# Patient Record
Sex: Female | Born: 1945 | Race: White | Hispanic: No | Marital: Married | Smoking: Current every day smoker
Health system: Southern US, Community
[De-identification: ages and names within clinical notes are randomized; demographics above are authoritative.]

## PROBLEM LIST (undated history)

## (undated) DIAGNOSIS — K9403 Colostomy malfunction: Secondary | ICD-10-CM

## (undated) DIAGNOSIS — C801 Malignant (primary) neoplasm, unspecified: Secondary | ICD-10-CM

## (undated) DIAGNOSIS — E669 Obesity, unspecified: Secondary | ICD-10-CM

## (undated) DIAGNOSIS — K5732 Diverticulitis of large intestine without perforation or abscess without bleeding: Secondary | ICD-10-CM

## (undated) DIAGNOSIS — Z87891 Personal history of nicotine dependence: Secondary | ICD-10-CM

## (undated) DIAGNOSIS — K9413 Enterostomy malfunction: Secondary | ICD-10-CM

## (undated) DIAGNOSIS — N189 Chronic kidney disease, unspecified: Secondary | ICD-10-CM

## (undated) DIAGNOSIS — Z87442 Personal history of urinary calculi: Secondary | ICD-10-CM

## (undated) HISTORY — DX: Malignant (primary) neoplasm, unspecified: C80.1

## (undated) HISTORY — DX: Colostomy malfunction: K94.03

## (undated) HISTORY — DX: Personal history of nicotine dependence: Z87.891

## (undated) HISTORY — DX: Enterostomy malfunction: K94.13

## (undated) HISTORY — PX: TUBAL LIGATION: SHX77

## (undated) HISTORY — DX: Personal history of urinary calculi: Z87.442

## (undated) HISTORY — DX: Obesity, unspecified: E66.9

## (undated) HISTORY — PX: DILATION AND CURETTAGE OF UTERUS: SHX78

## (undated) HISTORY — DX: Diverticulitis of large intestine without perforation or abscess without bleeding: K57.32

## (undated) HISTORY — PX: PYELOLITHOTOMY: SHX2278

## (undated) HISTORY — PX: COLONOSCOPY: SHX174

## (undated) HISTORY — PX: APPENDECTOMY: SHX54

---

## 1999-05-20 HISTORY — PX: COLON SURGERY: SHX602

## 2001-09-03 HISTORY — PX: MOLE REMOVAL: SHX2046

## 2004-12-04 ENCOUNTER — Ambulatory Visit: Payer: Self-pay | Admitting: Specialist

## 2004-12-14 ENCOUNTER — Ambulatory Visit: Payer: Self-pay | Admitting: Specialist

## 2005-08-13 ENCOUNTER — Ambulatory Visit: Payer: Self-pay | Admitting: General Surgery

## 2006-09-05 ENCOUNTER — Ambulatory Visit: Payer: Self-pay | Admitting: Family Medicine

## 2007-05-28 ENCOUNTER — Ambulatory Visit: Payer: Self-pay | Admitting: Specialist

## 2007-06-03 ENCOUNTER — Other Ambulatory Visit: Payer: Self-pay

## 2007-06-03 ENCOUNTER — Ambulatory Visit: Payer: Self-pay | Admitting: Specialist

## 2007-06-05 ENCOUNTER — Ambulatory Visit: Payer: Self-pay | Admitting: Specialist

## 2007-06-30 ENCOUNTER — Ambulatory Visit: Payer: Self-pay | Admitting: Specialist

## 2007-09-04 HISTORY — PX: LITHOTRIPSY: SUR834

## 2008-01-12 ENCOUNTER — Ambulatory Visit: Payer: Self-pay | Admitting: Family Medicine

## 2009-02-10 ENCOUNTER — Ambulatory Visit: Payer: Self-pay | Admitting: Family Medicine

## 2009-02-15 ENCOUNTER — Ambulatory Visit: Payer: Self-pay | Admitting: General Surgery

## 2009-04-08 ENCOUNTER — Ambulatory Visit: Payer: Self-pay | Admitting: Specialist

## 2009-09-03 DIAGNOSIS — C801 Malignant (primary) neoplasm, unspecified: Secondary | ICD-10-CM

## 2009-09-03 DIAGNOSIS — Z87442 Personal history of urinary calculi: Secondary | ICD-10-CM

## 2009-09-03 HISTORY — DX: Malignant (primary) neoplasm, unspecified: C80.1

## 2009-09-03 HISTORY — PX: COLOSTOMY: SHX63

## 2009-09-03 HISTORY — PX: HYSTEROSCOPY: SHX211

## 2009-09-03 HISTORY — DX: Personal history of urinary calculi: Z87.442

## 2009-09-03 HISTORY — PX: ABDOMINAL HYSTERECTOMY: SHX81

## 2010-02-01 ENCOUNTER — Ambulatory Visit: Payer: Self-pay | Admitting: Gynecologic Oncology

## 2010-02-08 ENCOUNTER — Ambulatory Visit: Payer: Self-pay | Admitting: Obstetrics and Gynecology

## 2010-02-09 ENCOUNTER — Ambulatory Visit: Payer: Self-pay | Admitting: Obstetrics and Gynecology

## 2010-02-14 ENCOUNTER — Ambulatory Visit: Payer: Self-pay | Admitting: Gynecologic Oncology

## 2010-02-17 ENCOUNTER — Ambulatory Visit: Payer: Self-pay | Admitting: Gynecologic Oncology

## 2010-02-20 ENCOUNTER — Ambulatory Visit: Payer: Self-pay | Admitting: Cardiovascular Disease

## 2010-02-21 ENCOUNTER — Inpatient Hospital Stay: Payer: Self-pay | Admitting: Gynecologic Oncology

## 2010-03-03 ENCOUNTER — Ambulatory Visit: Payer: Self-pay | Admitting: Gynecologic Oncology

## 2010-03-09 ENCOUNTER — Ambulatory Visit: Payer: Self-pay | Admitting: Oncology

## 2010-03-22 ENCOUNTER — Ambulatory Visit: Payer: Self-pay | Admitting: General Surgery

## 2010-04-03 ENCOUNTER — Ambulatory Visit: Payer: Self-pay | Admitting: Gynecologic Oncology

## 2010-04-03 ENCOUNTER — Ambulatory Visit: Payer: Self-pay | Admitting: Oncology

## 2010-05-04 ENCOUNTER — Ambulatory Visit: Payer: Self-pay | Admitting: Gynecologic Oncology

## 2010-05-04 ENCOUNTER — Ambulatory Visit: Payer: Self-pay | Admitting: Oncology

## 2010-05-10 ENCOUNTER — Inpatient Hospital Stay: Payer: Self-pay | Admitting: General Surgery

## 2010-05-10 HISTORY — PX: COLON SURGERY: SHX602

## 2010-05-18 ENCOUNTER — Inpatient Hospital Stay: Payer: Self-pay | Admitting: General Surgery

## 2010-05-23 LAB — PATHOLOGY REPORT

## 2010-06-03 ENCOUNTER — Ambulatory Visit: Payer: Self-pay | Admitting: Oncology

## 2010-06-21 ENCOUNTER — Ambulatory Visit: Payer: Self-pay | Admitting: Oncology

## 2010-07-04 ENCOUNTER — Ambulatory Visit: Payer: Self-pay | Admitting: Oncology

## 2010-08-03 ENCOUNTER — Ambulatory Visit: Payer: Self-pay | Admitting: Oncology

## 2010-08-25 LAB — CA 125: CA 125: 17.7 U/mL (ref 0.0–34.0)

## 2010-09-03 ENCOUNTER — Ambulatory Visit: Payer: Self-pay | Admitting: Oncology

## 2010-09-21 LAB — CA 125: CA 125: 15.3 U/mL (ref 0.0–34.0)

## 2010-10-04 ENCOUNTER — Ambulatory Visit: Payer: Self-pay | Admitting: Oncology

## 2010-11-02 ENCOUNTER — Ambulatory Visit: Payer: Self-pay | Admitting: Oncology

## 2010-11-13 ENCOUNTER — Ambulatory Visit: Payer: Self-pay | Admitting: Anesthesiology

## 2010-11-15 ENCOUNTER — Ambulatory Visit: Payer: Self-pay | Admitting: General Surgery

## 2010-11-15 HISTORY — PX: COLON SURGERY: SHX602

## 2010-12-03 ENCOUNTER — Ambulatory Visit: Payer: Self-pay | Admitting: Oncology

## 2010-12-13 ENCOUNTER — Ambulatory Visit: Payer: Self-pay | Admitting: Oncology

## 2011-01-02 ENCOUNTER — Ambulatory Visit: Payer: Self-pay | Admitting: Oncology

## 2011-02-20 ENCOUNTER — Ambulatory Visit: Payer: Self-pay | Admitting: Oncology

## 2011-03-04 ENCOUNTER — Ambulatory Visit: Payer: Self-pay | Admitting: Oncology

## 2011-04-04 ENCOUNTER — Ambulatory Visit: Payer: Self-pay | Admitting: Oncology

## 2011-06-20 ENCOUNTER — Ambulatory Visit: Payer: Self-pay | Admitting: Oncology

## 2011-06-21 LAB — CA 125: CA 125: 12.8 U/mL (ref 0.0–34.0)

## 2011-07-05 ENCOUNTER — Ambulatory Visit: Payer: Self-pay | Admitting: Oncology

## 2011-10-16 ENCOUNTER — Ambulatory Visit: Payer: Self-pay | Admitting: Gynecologic Oncology

## 2011-10-22 ENCOUNTER — Ambulatory Visit: Payer: Self-pay | Admitting: Oncology

## 2011-10-26 LAB — CBC CANCER CENTER
Basophil #: 0.1 x10 3/mm (ref 0.0–0.1)
Basophil %: 0.8 %
Eosinophil %: 1 %
HCT: 47 % (ref 35.0–47.0)
HGB: 15.4 g/dL (ref 12.0–16.0)
Lymphocyte #: 1.6 x10 3/mm (ref 1.0–3.6)
Lymphocyte %: 18.3 %
MCH: 35.8 pg — ABNORMAL HIGH (ref 26.0–34.0)
MCHC: 32.8 g/dL (ref 32.0–36.0)
MCV: 109.3 fL — ABNORMAL HIGH (ref 80–100)
Monocyte %: 8 %
RBC: 4.29 10*6/uL (ref 3.80–5.20)
RDW: 14.6 % — ABNORMAL HIGH (ref 11.5–14.5)
WBC: 8.9 x10 3/mm (ref 3.6–11.0)

## 2011-10-26 LAB — COMPREHENSIVE METABOLIC PANEL
Albumin: 3.5 g/dL (ref 3.4–5.0)
Bilirubin,Total: 0.7 mg/dL (ref 0.2–1.0)
Calcium, Total: 8.9 mg/dL (ref 8.5–10.1)
Chloride: 102 mmol/L (ref 98–107)
Co2: 26 mmol/L (ref 21–32)
Creatinine: 1.21 mg/dL (ref 0.60–1.30)
EGFR (African American): 58 — ABNORMAL LOW
Potassium: 3.3 mmol/L — ABNORMAL LOW (ref 3.5–5.1)
SGOT(AST): 20 U/L (ref 15–37)
Sodium: 141 mmol/L (ref 136–145)

## 2011-10-28 LAB — CA 125: CA 125: 15.4 U/mL (ref 0.0–34.0)

## 2011-10-30 ENCOUNTER — Ambulatory Visit: Payer: Self-pay | Admitting: Family Medicine

## 2011-11-02 ENCOUNTER — Ambulatory Visit: Payer: Self-pay | Admitting: Gynecologic Oncology

## 2012-01-25 ENCOUNTER — Ambulatory Visit: Payer: Self-pay | Admitting: Oncology

## 2012-01-25 LAB — CBC CANCER CENTER
Basophil #: 0.1 x10 3/mm (ref 0.0–0.1)
Basophil %: 1.2 %
Eosinophil #: 0.1 x10 3/mm (ref 0.0–0.7)
HGB: 15.2 g/dL (ref 12.0–16.0)
Lymphocyte #: 1.9 x10 3/mm (ref 1.0–3.6)
MCH: 36.5 pg — ABNORMAL HIGH (ref 26.0–34.0)
MCHC: 34.3 g/dL (ref 32.0–36.0)
MCV: 106 fL — ABNORMAL HIGH (ref 80–100)
Monocyte #: 1.1 x10 3/mm — ABNORMAL HIGH (ref 0.2–0.9)
Neutrophil #: 6.4 x10 3/mm (ref 1.4–6.5)
Platelet: 232 x10 3/mm (ref 150–440)
RBC: 4.16 10*6/uL (ref 3.80–5.20)
RDW: 13.5 % (ref 11.5–14.5)
WBC: 9.6 x10 3/mm (ref 3.6–11.0)

## 2012-01-25 LAB — COMPREHENSIVE METABOLIC PANEL
Albumin: 3.4 g/dL (ref 3.4–5.0)
Anion Gap: 11 (ref 7–16)
BUN: 23 mg/dL — ABNORMAL HIGH (ref 7–18)
Calcium, Total: 9 mg/dL (ref 8.5–10.1)
Chloride: 103 mmol/L (ref 98–107)
Creatinine: 1.08 mg/dL (ref 0.60–1.30)
EGFR (African American): >60
Osmolality: 284 (ref 275–301)
SGOT(AST): 23 U/L (ref 15–37)
Sodium: 140 mmol/L (ref 136–145)

## 2012-01-27 LAB — CA 125: CA 125: 18.6 U/mL (ref 0.0–34.0)

## 2012-02-02 ENCOUNTER — Ambulatory Visit: Payer: Self-pay | Admitting: Oncology

## 2012-04-15 ENCOUNTER — Ambulatory Visit: Payer: Self-pay | Admitting: Gynecologic Oncology

## 2012-04-15 LAB — CBC CANCER CENTER
Basophil #: 0.1 x10 3/mm (ref 0.0–0.1)
Basophil %: 1.1 %
Eosinophil #: 0.1 x10 3/mm (ref 0.0–0.7)
Eosinophil %: 0.9 %
HCT: 45.5 % (ref 35.0–47.0)
HGB: 15.1 g/dL (ref 12.0–16.0)
Lymphocyte #: 2.3 x10 3/mm (ref 1.0–3.6)
Lymphocyte %: 24.4 %
MCH: 35.2 pg — ABNORMAL HIGH (ref 26.0–34.0)
MCHC: 33.3 g/dL (ref 32.0–36.0)
MCV: 106 fL — ABNORMAL HIGH (ref 80–100)
Monocyte #: 0.9 x10 3/mm (ref 0.2–0.9)
Monocyte %: 10 %
Neutrophil #: 6 x10 3/mm (ref 1.4–6.5)
Neutrophil %: 63.6 %
Platelet: 224 x10 3/mm (ref 150–440)
RBC: 4.3 10*6/uL (ref 3.80–5.20)
RDW: 13.9 % (ref 11.5–14.5)
WBC: 9.4 x10 3/mm (ref 3.6–11.0)

## 2012-04-15 LAB — COMPREHENSIVE METABOLIC PANEL
Albumin: 3.4 g/dL (ref 3.4–5.0)
Alkaline Phosphatase: 112 U/L (ref 50–136)
Anion Gap: 13 (ref 7–16)
BUN: 21 mg/dL — ABNORMAL HIGH (ref 7–18)
Bilirubin,Total: 0.5 mg/dL (ref 0.2–1.0)
Calcium, Total: 8.9 mg/dL (ref 8.5–10.1)
Chloride: 102 mmol/L (ref 98–107)
Co2: 24 mmol/L (ref 21–32)
Creatinine: 1.17 mg/dL (ref 0.60–1.30)
EGFR (African American): 57 — ABNORMAL LOW
EGFR (Non-African Amer.): 49 — ABNORMAL LOW
Glucose: 109 mg/dL — ABNORMAL HIGH (ref 65–99)
Osmolality: 281 (ref 275–301)
Potassium: 3.7 mmol/L (ref 3.5–5.1)
SGOT(AST): 21 U/L (ref 15–37)
SGPT (ALT): 23 U/L (ref 12–78)
Sodium: 139 mmol/L (ref 136–145)
Total Protein: 7.2 g/dL (ref 6.4–8.2)

## 2012-04-21 ENCOUNTER — Ambulatory Visit: Payer: Self-pay | Admitting: Oncology

## 2012-05-04 ENCOUNTER — Ambulatory Visit: Payer: Self-pay | Admitting: Gynecologic Oncology

## 2012-11-05 ENCOUNTER — Ambulatory Visit: Payer: Self-pay | Admitting: Family Medicine

## 2012-12-05 ENCOUNTER — Ambulatory Visit: Payer: Self-pay | Admitting: Oncology

## 2012-12-05 LAB — CBC CANCER CENTER
Basophil %: 0.5 %
Eosinophil #: 0.1 x10 3/mm (ref 0.0–0.7)
Eosinophil %: 0.8 %
Lymphocyte %: 14.4 %
MCHC: 33.7 g/dL (ref 32.0–36.0)
MCV: 105 fL — ABNORMAL HIGH (ref 80–100)
Monocyte #: 0.8 x10 3/mm (ref 0.2–0.9)
Neutrophil %: 75.7 %
Platelet: 208 x10 3/mm (ref 150–440)
RBC: 4.23 10*6/uL (ref 3.80–5.20)

## 2012-12-05 LAB — COMPREHENSIVE METABOLIC PANEL
Albumin: 3.6 g/dL (ref 3.4–5.0)
Anion Gap: 13 (ref 7–16)
Bilirubin,Total: 0.8 mg/dL (ref 0.2–1.0)
Calcium, Total: 9.1 mg/dL (ref 8.5–10.1)
Chloride: 102 mmol/L (ref 98–107)
Co2: 26 mmol/L (ref 21–32)
Creatinine: 1.23 mg/dL (ref 0.60–1.30)
Osmolality: 287 (ref 275–301)
Potassium: 4.4 mmol/L (ref 3.5–5.1)
SGOT(AST): 26 U/L (ref 15–37)
Sodium: 141 mmol/L (ref 136–145)
Total Protein: 7.4 g/dL (ref 6.4–8.2)

## 2012-12-06 LAB — CA 125: CA 125: 13.7 U/mL (ref 0.0–34.0)

## 2012-12-31 ENCOUNTER — Encounter: Payer: Self-pay | Admitting: *Deleted

## 2013-01-01 ENCOUNTER — Ambulatory Visit: Payer: Self-pay | Admitting: Oncology

## 2013-01-19 ENCOUNTER — Ambulatory Visit: Payer: Self-pay | Admitting: General Surgery

## 2013-02-18 ENCOUNTER — Encounter: Payer: Self-pay | Admitting: General Surgery

## 2013-02-18 ENCOUNTER — Ambulatory Visit (INDEPENDENT_AMBULATORY_CARE_PROVIDER_SITE_OTHER): Payer: BC Managed Care – PPO | Admitting: General Surgery

## 2013-02-18 VITALS — BP 134/72 | HR 90 | Resp 16 | Ht 62.0 in | Wt 162.0 lb

## 2013-02-18 DIAGNOSIS — Z4659 Encounter for fitting and adjustment of other gastrointestinal appliance and device: Secondary | ICD-10-CM

## 2013-02-18 DIAGNOSIS — IMO0002 Reserved for concepts with insufficient information to code with codable children: Secondary | ICD-10-CM

## 2013-02-18 DIAGNOSIS — K9403 Colostomy malfunction: Secondary | ICD-10-CM

## 2013-02-18 NOTE — Patient Instructions (Signed)
Nurse 1 week

## 2013-02-18 NOTE — Progress Notes (Deleted)
Patient ID: Virlee Stroschein, female   DOB: 1945-09-04, 67 y.o.   MRN: [id number]  Chief Complaint  Patient presents with  . Other    check stoma    HPI Mylah Baynes is a 67 y.o. female here for her HPI  Past Medical History  Diagnosis Date  . Diverticulitis of colon (without mention of hemorrhage)   . Mechanical complication of colostomy and enterostomy   . Hypertension     Past Surgical History  Procedure Laterality Date  . Colonoscopy  2011    DR. BYRNET  . Appendectomy    . Lithotripsy  2009  . Tubal ligation    . Pyelolithotomy    . Colostomy  2011  . Abdominal hysterectomy  2011  . Dilation and curettage of uterus      Family History  Problem Relation Age of Onset  . Breast cancer Maternal Grandmother   . Colon cancer Mother     Social History History  Substance Use Topics  . Smoking status: Current Every Day Smoker -- 15 years  . Smokeless tobacco: Not on file  . Alcohol Use: Yes    No Known Allergies  No current outpatient prescriptions on file.   No current facility-administered medications for this visit.    Review of Systems Review of Systems  Constitutional: Negative.   Respiratory: Negative.   Cardiovascular: Negative.     There were no vitals taken for this visit.  Physical Exam Physical Exam  Data Reviewed ***  Assessment    ***    Plan    ***       Ples Specter 02/18/2013, 4:06 PM

## 2013-02-18 NOTE — Progress Notes (Signed)
Patient ID: [name], female   DOB: 04/28/1946, 67 y.o.   MRN: [id number]  Chief Complaint  Patient presents with  . Other    check stoma    HPI [name] is a 67 y.o. female.  Patient here today for stoma check. The patient reports weeping and bleeding from the inferior aspect of her stoma. This is coming increasingly uncomfortable. She is not experiencing any stomal leakage.  The patient had undergone a radical hysterectomy node dissection for cancer. She developed a colo vaginal fistula and subsequently underwent a elective sigmoid colectomy. On postoperative day 8 she developed an anastomotic leak requiring diverting colostomy as well as closure of the distal colon segment..  The patient reports she occasionally will pass a small flatus and mucous through her rectum. She is reassured that this is normal.   HPI  Past Medical History  Diagnosis Date  . Diverticulitis of colon (without mention of hemorrhage)   . Mechanical complication of colostomy and enterostomy   . Hypertension     Past Surgical History  Procedure Laterality Date  . Colonoscopy  2011    DR. BYRNET  . Appendectomy    . Lithotripsy  2009  . Tubal ligation    . Pyelolithotomy    . Colostomy  2011  . Abdominal hysterectomy  2011  . Dilation and curettage of uterus      Family History  Problem Relation Age of Onset  . Breast cancer Maternal Grandmother   . Colon cancer Mother     Social History History  Substance Use Topics  . Smoking status: Current Every Day Smoker -- 15 years  . Smokeless tobacco: Never Used  . Alcohol Use: Yes    No Known Allergies  No current outpatient prescriptions on file.   No current facility-administered medications for this visit.    Review of Systems Review of Systems  Constitutional: Negative.   Respiratory: Negative.   Cardiovascular: Negative.     Blood pressure 134/72, pulse 90, resp. rate 16, height 5\' 2"  (1.575 m), weight 162 lb (73.483  kg).  Physical Exam Physical Exam  Constitutional: She appears well-developed and well-nourished.  Cardiovascular: Normal rate, regular rhythm and normal heart sounds.   Pulmonary/Chest: Breath sounds normal.  Lymphadenopathy:    She has no cervical adenopathy.  Neurological: She is alert.  Skin: Skin is warm and dry.   abdominal examination shows a weakness in the superior aspect of the midline incision. Easily reducible contents. Mild diffuse bulge in the area around the umbilicus. Digital rectal exam of the stoma shows the loop of colon evident without significant widening of the fascial defect.   There is excoriation on the exposed mucosa of the lower half of the stoma primarily from the 4 to 8:00 position. This was treated with silver nitrate. The stoma easily admits the index finger.  Data Reviewed CT scan of the abdomen dated 04/21/2012 shows evidence of a loop of colon adjacent to the stoma site as well as a midline ventral hernia. (Multiple). No evidence of obstruction no evidence of recurrent metastatic disease. Ossified gallstone.  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    Plan    The patient will return in one week for a second application of silver nitrate by the nurse. Further followup will take place based on the results of her spots to this therapy. At this time the gallstone and ventral hernias very symptomatic and will continue to be  observed.       Earline Mayotte 02/19/2013, 3:57 PM

## 2013-02-19 ENCOUNTER — Encounter: Payer: Self-pay | Admitting: General Surgery

## 2013-02-19 DIAGNOSIS — K9403 Colostomy malfunction: Secondary | ICD-10-CM | POA: Insufficient documentation

## 2013-02-19 DIAGNOSIS — IMO0002 Reserved for concepts with insufficient information to code with codable children: Secondary | ICD-10-CM | POA: Insufficient documentation

## 2013-02-25 ENCOUNTER — Ambulatory Visit: Payer: BC Managed Care – PPO | Admitting: *Deleted

## 2013-02-25 DIAGNOSIS — IMO0002 Reserved for concepts with insufficient information to code with codable children: Secondary | ICD-10-CM

## 2013-02-25 NOTE — Patient Instructions (Signed)
Patient to call for changes or new issues.

## 2013-02-25 NOTE — Progress Notes (Signed)
Patient ID: [name], female   DOB: 19-May-1946, 67 y.o.   MRN: [id number]  Patient here today for 1 week follow up for a stoma. Patient states liquid drainage is less. Stoma area size measures less than 1 inch now.  Silver  Nitrate used to lower portion of stoma as before.  Colostomy appliance reapplied.  Patient to call for changes.

## 2013-03-16 ENCOUNTER — Encounter: Payer: Self-pay | Admitting: *Deleted

## 2013-04-27 ENCOUNTER — Ambulatory Visit: Payer: Self-pay | Admitting: Gynecologic Oncology

## 2013-08-14 ENCOUNTER — Ambulatory Visit: Payer: Self-pay | Admitting: Oncology

## 2013-08-14 LAB — COMPREHENSIVE METABOLIC PANEL
Alkaline Phosphatase: 91 U/L
Anion Gap: 12 (ref 7–16)
BUN: 25 mg/dL — ABNORMAL HIGH (ref 7–18)
Bilirubin,Total: 0.5 mg/dL (ref 0.2–1.0)
Chloride: 104 mmol/L (ref 98–107)
Co2: 25 mmol/L (ref 21–32)
Creatinine: 1.37 mg/dL — ABNORMAL HIGH (ref 0.60–1.30)
EGFR (African American): 46 — ABNORMAL LOW
EGFR (Non-African Amer.): 40 — ABNORMAL LOW
Glucose: 105 mg/dL — ABNORMAL HIGH (ref 65–99)
Osmolality: 286 (ref 275–301)
Potassium: 3.8 mmol/L (ref 3.5–5.1)
SGOT(AST): 18 U/L (ref 15–37)
Sodium: 141 mmol/L (ref 136–145)
Total Protein: 7.6 g/dL (ref 6.4–8.2)

## 2013-08-14 LAB — CBC CANCER CENTER
Basophil #: 0.2 x10 3/mm — ABNORMAL HIGH (ref 0.0–0.1)
HCT: 46.1 % (ref 35.0–47.0)
HGB: 15.2 g/dL (ref 12.0–16.0)
Lymphocyte #: 2.4 x10 3/mm (ref 1.0–3.6)
Lymphocyte %: 18.1 %
MCV: 108 fL — ABNORMAL HIGH (ref 80–100)
Neutrophil %: 72.1 %
RBC: 4.28 10*6/uL (ref 3.80–5.20)
RDW: 13.9 % (ref 11.5–14.5)
WBC: 13.4 x10 3/mm — ABNORMAL HIGH (ref 3.6–11.0)

## 2013-08-15 LAB — CA 125: CA 125: 14.9 U/mL (ref 0.0–34.0)

## 2013-09-03 ENCOUNTER — Ambulatory Visit: Payer: Self-pay | Admitting: Oncology

## 2013-09-10 ENCOUNTER — Ambulatory Visit: Payer: BC Managed Care – PPO | Admitting: General Surgery

## 2013-09-28 ENCOUNTER — Ambulatory Visit (INDEPENDENT_AMBULATORY_CARE_PROVIDER_SITE_OTHER): Payer: Self-pay | Admitting: General Surgery

## 2013-09-28 ENCOUNTER — Encounter: Payer: Self-pay | Admitting: General Surgery

## 2013-09-28 VITALS — BP 124/72 | HR 76 | Resp 12 | Ht 62.0 in | Wt 155.0 lb

## 2013-09-28 DIAGNOSIS — K439 Ventral hernia without obstruction or gangrene: Secondary | ICD-10-CM

## 2013-09-28 NOTE — Patient Instructions (Signed)
Follow up appointment to be announced.  

## 2013-09-28 NOTE — Progress Notes (Signed)
Patient ID: [name], female   DOB: March 06, 1946, 68 y.o.   MRN: [id number]  Chief Complaint  Patient presents with  . Other    hernia    HPI [name] is a 68 y.o. female here for an evaluation of an ventral hernia been there for about one year. Patient states the area does not hurt her. Patient states she thinks she needs to have a bowel movement at the rectal area once or twice daily.  HPI  Past Medical History  Diagnosis Date  . Diverticulitis of colon (without mention of hemorrhage)   . Mechanical complication of colostomy and enterostomy   . Hypertension   . Cancer 2011    endometrial  . Personal history of tobacco use, presenting hazards to health   . History of kidney stones 2011  . Obesity, unspecified     Past Surgical History  Procedure Laterality Date  . Colonoscopy  2011    DR. BYRNET  . Appendectomy    . Lithotripsy  2009  . Tubal ligation    . Pyelolithotomy    . Colostomy  2011  . Abdominal hysterectomy  2011  . Dilation and curettage of uterus    . Mole removal  2003  . Hysteroscopy  2011    Family History  Problem Relation Age of Onset  . Breast cancer Maternal Grandmother   . Colon cancer Mother     Social History History  Substance Use Topics  . Smoking status: Current Every Day Smoker -- 15 years  . Smokeless tobacco: Never Used  . Alcohol Use: Yes    No Known Allergies  No current outpatient prescriptions on file.   No current facility-administered medications for this visit.    Review of Systems Review of Systems  Constitutional: Negative.   Respiratory: Negative.   Cardiovascular: Negative.     Blood pressure 124/72, pulse 76, resp. rate 12, height 5\' 2"  (1.575 m), weight 155 lb (70.308 kg).  Physical Exam Physical Exam  Constitutional: She is oriented to person, place, and time. She appears well-developed and well-nourished.  Eyes: Conjunctivae are normal.  Cardiovascular: Normal rate, regular rhythm and  normal heart sounds.   Pulmonary/Chest: Breath sounds normal.  Abdominal: Soft. Normal appearance and bowel sounds are normal. There is no hepatosplenomegaly. There is no tenderness. A hernia is present. Hernia confirmed positive in the ventral area.  Hernia below the belly button.   Neurological: She is alert and oriented to person, place, and time.  Skin: Skin is warm and dry.    Data Reviewed CT scan of the abdomen dated 04/21/2012 shows evidence of a loop of colon adjacent to the stoma site as well as a midline ventral hernia. (Multiple). No evidence of obstruction no evidence of recurrent metastatic disease. Ossified gallstone.   Assessment    Improve stomal function.  Asymptomatic ventral hernias.     Plan    At this time, without a total guarantee that she would not experience an anastomotic leak, she is not interested in investigating whether a colostomy takedown as possible. Her hernias are essentially stable from her exam in June. They are not interfering with stomal function.  The patient although by the office if she desires investigation for hernia repair or stomal reversal.        [name] 09/29/2013, 8:16 PM

## 2013-09-29 DIAGNOSIS — K439 Ventral hernia without obstruction or gangrene: Secondary | ICD-10-CM | POA: Insufficient documentation

## 2013-09-30 ENCOUNTER — Encounter: Payer: Self-pay | Admitting: General Surgery

## 2013-10-19 ENCOUNTER — Encounter: Payer: Self-pay | Admitting: General Surgery

## 2013-12-17 ENCOUNTER — Ambulatory Visit: Payer: Self-pay | Admitting: Family Medicine

## 2013-12-30 ENCOUNTER — Telehealth: Payer: Self-pay | Admitting: *Deleted

## 2013-12-30 NOTE — Telephone Encounter (Signed)
Phone call from patient asking for a new RX for ostomy bags and supplies sent to [facility] in [place]. Completed RX and faxed to # [phone] att: Ian Bushman

## 2014-01-19 ENCOUNTER — Ambulatory Visit: Payer: Self-pay | Admitting: Oncology

## 2014-01-19 LAB — CBC CANCER CENTER
BASOS ABS: 0.2 x10 3/mm — AB (ref 0.0–0.1)
BASOS PCT: 1.6 %
Eosinophil #: 0.1 x10 3/mm (ref 0.0–0.7)
Eosinophil %: 1.2 %
HCT: 43 % (ref 35.0–47.0)
HGB: 14.6 g/dL (ref 12.0–16.0)
LYMPHS ABS: 1.9 x10 3/mm (ref 1.0–3.6)
LYMPHS PCT: 15.5 %
MCH: 35.7 pg — AB (ref 26.0–34.0)
MCHC: 34 g/dL (ref 32.0–36.0)
MCV: 105 fL — ABNORMAL HIGH (ref 80–100)
MONOS PCT: 7.9 %
Monocyte #: 1 x10 3/mm — ABNORMAL HIGH (ref 0.2–0.9)
NEUTROS ABS: 9.2 x10 3/mm — AB (ref 1.4–6.5)
Neutrophil %: 73.8 %
PLATELETS: 312 x10 3/mm (ref 150–440)
RBC: 4.1 10*6/uL (ref 3.80–5.20)
RDW: 13.6 % (ref 11.5–14.5)
WBC: 12.4 x10 3/mm — ABNORMAL HIGH (ref 3.6–11.0)

## 2014-01-19 LAB — COMPREHENSIVE METABOLIC PANEL
ALBUMIN: 3.4 g/dL (ref 3.4–5.0)
ALT: 15 U/L (ref 12–78)
Alkaline Phosphatase: 102 U/L
Anion Gap: 11 (ref 7–16)
BILIRUBIN TOTAL: 0.6 mg/dL (ref 0.2–1.0)
BUN: 18 mg/dL (ref 7–18)
CHLORIDE: 104 mmol/L (ref 98–107)
CREATININE: 1.25 mg/dL (ref 0.60–1.30)
Calcium, Total: 8.6 mg/dL (ref 8.5–10.1)
Co2: 26 mmol/L (ref 21–32)
EGFR (African American): 52 — ABNORMAL LOW
GFR CALC NON AF AMER: 45 — AB
GLUCOSE: 119 mg/dL — AB (ref 65–99)
OSMOLALITY: 284 (ref 275–301)
POTASSIUM: 4.2 mmol/L (ref 3.5–5.1)
SGOT(AST): 14 U/L — ABNORMAL LOW (ref 15–37)
Sodium: 141 mmol/L (ref 136–145)
Total Protein: 7.6 g/dL (ref 6.4–8.2)

## 2014-01-20 LAB — CA 125: CA 125: 19 U/mL (ref 0.0–34.0)

## 2014-02-01 ENCOUNTER — Ambulatory Visit: Payer: Self-pay | Admitting: Oncology

## 2014-02-01 HISTORY — PX: OTHER SURGICAL HISTORY: SHX169

## 2014-02-08 ENCOUNTER — Encounter: Payer: Self-pay | Admitting: General Surgery

## 2014-03-02 ENCOUNTER — Other Ambulatory Visit: Payer: Self-pay | Admitting: General Surgery

## 2014-03-02 ENCOUNTER — Encounter: Payer: Self-pay | Admitting: General Surgery

## 2014-03-02 ENCOUNTER — Ambulatory Visit (INDEPENDENT_AMBULATORY_CARE_PROVIDER_SITE_OTHER): Payer: No Typology Code available for payment source | Admitting: General Surgery

## 2014-03-02 VITALS — BP 124/84 | HR 74 | Resp 12 | Ht 62.0 in | Wt 164.0 lb

## 2014-03-02 DIAGNOSIS — Z860100 Personal history of colon polyps, unspecified: Secondary | ICD-10-CM | POA: Insufficient documentation

## 2014-03-02 DIAGNOSIS — Z1211 Encounter for screening for malignant neoplasm of colon: Secondary | ICD-10-CM

## 2014-03-02 MED ORDER — POLYETHYLENE GLYCOL 3350 17 GM/SCOOP PO POWD: ORAL | Status: DC

## 2014-03-02 NOTE — Progress Notes (Signed)
Patient ID: [name], female   DOB: 06-Mar-1946, 68 y.o.   MRN: [id number]  Chief Complaint  Patient presents with  . Other    colonoscopy    HPI [name] is a 68 y.o. female. here today for a colonoscopy screening. Last one done in 2010. No problems at this time.  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 Procedure was complicated by anastomotic dehiscence on postoperative day resulting in colostomy and Hartmann procedure. Patient has developed multiple ventral hernias which have remained relatively asymptomatic    HPI  Past Medical History  Diagnosis Date  . Diverticulitis of colon (without mention of hemorrhage)   . Mechanical complication of colostomy and enterostomy   . Hypertension   . Cancer 2011    endometrial  . Personal history of tobacco use, presenting hazards to health   . History of kidney stones 2011  . Obesity, unspecified     Past Surgical History  Procedure Laterality Date  . Appendectomy    . Lithotripsy  2009  . Tubal ligation    . Pyelolithotomy    . Colostomy  2011  . Abdominal hysterectomy  2011  . Dilation and curettage of uterus    . Mole removal  2003  . Hysteroscopy  2011  . Colonoscopy  June 2010    Tubular adenoma of the transverse colon, hyperplastic polyps of the sigmoid.  . 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 Colon surgery  05/20/1999    Drainage of pelvic abscess, end colostomy  . Colon surgery  11/15/2010    Revision of colostomy stoma  . Skin cancer removal  02/2014    Family History  Problem Relation Age of Onset  . Breast cancer Maternal Grandmother   . Colon cancer Mother     Social History History  Substance Use Topics  . Smoking status: Current Every Day Smoker -- 15 years  . Smokeless tobacco: Never Used  . Alcohol Use: Yes    No Known Allergies  Current Outpatient  Prescriptions  Medication Sig Dispense Refill  . polyethylene glycol powder (GLYCOLAX/MIRALAX) powder 255 grams one bottle for colonoscopy prep  255 g  0   No current facility-administered medications for this visit.    Review of Systems Review of Systems  Constitutional: Negative.   Respiratory: Negative.   Cardiovascular: Negative.   Gastrointestinal: Negative.     Blood pressure 124/84, pulse 74, resp. rate 12, height 5\' 2"  (1.575 m), weight 164 lb (74.39 kg).  Physical Exam Physical Exam  Constitutional: She is oriented to person, place, and time. She appears well-developed and well-nourished.  Cardiovascular: Normal rate, regular rhythm and normal heart sounds.   No murmur heard. Pulmonary/Chest: Effort normal and breath sounds normal.  Abdominal: Soft. Normal appearance and bowel sounds are normal. There is no hepatosplenomegaly. There is no tenderness. A hernia (multiple midline hernias present) is present.    Neurological: She is alert and oriented to person, place, and time.  Skin: Skin is warm and dry.    Data Reviewed Operative report, pathology.  Assessment    Candidate for screening endoscopy.  Multiple ventral hernias.    Plan    The patient was asked to make use of a fleets enema per rectum 2 days prior to the procedure followed by 4 ounces of warm water to rinse the rectum.  She'll make use of MiraLax  for proximal bowel prep. She may discontinue ingestion of the preparation fluid in the colostomy effluent is clear.  The possibility of colostomy takedown and ventral hernia  Has been broached in the past. At this time the patient is cool to the idea for additional surgical intervention.    Patient has been scheduled for a colonoscopy on 03-31-14 at [facility].  PCP: Paulina Fusi 03/02/2014, 6:41 PM

## 2014-03-02 NOTE — Patient Instructions (Addendum)
Colonoscopy A colonoscopy is an exam to look at the entire large intestine (colon). This exam can help find problems such as tumors, polyps, inflammation, and areas of bleeding. The exam takes about 1 hour.  LET YOUR HEALTH CARE Valaree Fresquez KNOW ABOUT:   Any allergies you have.  All medicines you are taking, including vitamins, herbs, eye drops, creams, and over-the-counter medicines.  Previous problems you or members of your family have had with the use of anesthetics.  Any blood disorders you have.  Previous surgeries you have had.  Medical conditions you have. RISKS AND COMPLICATIONS  Generally, this is a safe procedure. However, as with any procedure, complications can occur. Possible complications include:  Bleeding.  Tearing or rupture of the colon wall.  Reaction to medicines given during the exam.  Infection (rare). BEFORE THE PROCEDURE   Ask your health care Tamika Shropshire about changing or stopping your regular medicines.  You may be prescribed an oral bowel prep. This involves drinking a large amount of medicated liquid, starting the day before your procedure. The liquid will cause you to have multiple loose stools until your stool is almost clear or light green. This cleans out your colon in preparation for the procedure.  Do not eat or drink anything else once you have started the bowel prep, unless your health care Kerrianne Jeng tells you it is safe to do so.  Arrange for someone to drive you home after the procedure. PROCEDURE   You will be given medicine to help you relax (sedative).  You will lie on your side with your knees bent.  A long, flexible tube with a light and camera on the end (colonoscope) will be inserted through the rectum and into the colon. The camera sends video back to a computer screen as it moves through the colon. The colonoscope also releases carbon dioxide gas to inflate the colon. This helps your health care Renelle Stegenga see the area better.  During  the exam, your health care Kristeen Lantz may take a small tissue sample (biopsy) to be examined under a microscope if any abnormalities are found.  The exam is finished when the entire colon has been viewed. AFTER THE PROCEDURE   Do not drive for 24 hours after the exam.  You may have a small amount of blood in your stool.  You may pass moderate amounts of gas and have mild abdominal cramping or bloating. This is caused by the gas used to inflate your colon during the exam.  Ask when your test results will be ready and how you will get your results. Make sure you get your test results. Document Released: 08/17/2000 Document Revised: 06/10/2013 Document Reviewed: 04/27/2013 [employer] Patient Information 2015 [employer]. This information is not intended to replace advice given to you by your health care Ashir Kunz. Make sure you discuss any questions you have with your health care Masaki Rothbauer.  Patient has been scheduled for a colonoscopy on 03-31-14 at [facility].  

## 2014-03-31 ENCOUNTER — Inpatient Hospital Stay: Payer: Self-pay | Admitting: General Surgery

## 2014-03-31 DIAGNOSIS — Z1211 Encounter for screening for malignant neoplasm of colon: Secondary | ICD-10-CM

## 2014-03-31 LAB — CBC WITH DIFFERENTIAL/PLATELET
Basophil #: 0.1 10*3/uL (ref 0.0–0.1)
Basophil %: 1.1 %
EOS ABS: 0.1 10*3/uL (ref 0.0–0.7)
EOS PCT: 1.1 %
HCT: 43.6 % (ref 35.0–47.0)
HGB: 14.1 g/dL (ref 12.0–16.0)
LYMPHS ABS: 1.6 10*3/uL (ref 1.0–3.6)
LYMPHS PCT: 16.4 %
MCH: 35.3 pg — AB (ref 26.0–34.0)
MCHC: 32.5 g/dL (ref 32.0–36.0)
MCV: 109 fL — AB (ref 80–100)
MONOS PCT: 7.7 %
Monocyte #: 0.7 x10 3/mm (ref 0.2–0.9)
Neutrophil #: 7.1 10*3/uL — ABNORMAL HIGH (ref 1.4–6.5)
Neutrophil %: 73.7 %
PLATELETS: 217 10*3/uL (ref 150–440)
RBC: 4.01 10*6/uL (ref 3.80–5.20)
RDW: 15 % — ABNORMAL HIGH (ref 11.5–14.5)
WBC: 9.6 10*3/uL (ref 3.6–11.0)

## 2014-03-31 LAB — BASIC METABOLIC PANEL
ANION GAP: 8 (ref 7–16)
BUN: 13 mg/dL (ref 7–18)
CALCIUM: 7.2 mg/dL — AB (ref 8.5–10.1)
Chloride: 103 mmol/L (ref 98–107)
Co2: 22 mmol/L (ref 21–32)
Creatinine: 1.34 mg/dL — ABNORMAL HIGH (ref 0.60–1.30)
EGFR (African American): 47 — ABNORMAL LOW
EGFR (Non-African Amer.): 41 — ABNORMAL LOW
Glucose: 100 mg/dL — ABNORMAL HIGH (ref 65–99)
Osmolality: 267 (ref 275–301)
Potassium: 3.4 mmol/L — ABNORMAL LOW (ref 3.5–5.1)
SODIUM: 133 mmol/L — AB (ref 136–145)

## 2014-04-01 ENCOUNTER — Encounter: Payer: Self-pay | Admitting: General Surgery

## 2014-04-01 LAB — CBC WITH DIFFERENTIAL/PLATELET
BASOS ABS: 0 10*3/uL (ref 0.0–0.1)
Basophil %: 0.3 %
Eosinophil #: 0.1 10*3/uL (ref 0.0–0.7)
Eosinophil %: 0.4 %
HCT: 41 % (ref 35.0–47.0)
HGB: 13.5 g/dL (ref 12.0–16.0)
LYMPHS ABS: 1.2 10*3/uL (ref 1.0–3.6)
Lymphocyte %: 7.2 %
MCH: 35.9 pg — ABNORMAL HIGH (ref 26.0–34.0)
MCHC: 32.8 g/dL (ref 32.0–36.0)
MCV: 109 fL — ABNORMAL HIGH (ref 80–100)
MONO ABS: 1.2 x10 3/mm — AB (ref 0.2–0.9)
MONOS PCT: 6.9 %
Neutrophil #: 14.3 10*3/uL — ABNORMAL HIGH (ref 1.4–6.5)
Neutrophil %: 85.2 %
PLATELETS: 186 10*3/uL (ref 150–440)
RBC: 3.75 10*6/uL — AB (ref 3.80–5.20)
RDW: 14.9 % — AB (ref 11.5–14.5)
WBC: 16.8 10*3/uL — ABNORMAL HIGH (ref 3.6–11.0)

## 2014-04-01 LAB — FOLATE: Folic Acid: 3.7 ng/mL (ref 3.1–100.0)

## 2014-04-02 LAB — CBC WITH DIFFERENTIAL/PLATELET
BASOS ABS: 0.1 10*3/uL (ref 0.0–0.1)
BASOS PCT: 0.6 %
Eosinophil #: 0.2 10*3/uL (ref 0.0–0.7)
Eosinophil %: 1.5 %
HCT: 40.4 % (ref 35.0–47.0)
HGB: 13.6 g/dL (ref 12.0–16.0)
LYMPHS ABS: 1.7 10*3/uL (ref 1.0–3.6)
Lymphocyte %: 12.6 %
MCH: 36.7 pg — AB (ref 26.0–34.0)
MCHC: 33.6 g/dL (ref 32.0–36.0)
MCV: 109 fL — ABNORMAL HIGH (ref 80–100)
MONO ABS: 0.9 x10 3/mm (ref 0.2–0.9)
Monocyte %: 7 %
NEUTROS ABS: 10.5 10*3/uL — AB (ref 1.4–6.5)
NEUTROS PCT: 78.3 %
PLATELETS: 189 10*3/uL (ref 150–440)
RBC: 3.7 10*6/uL — AB (ref 3.80–5.20)
RDW: 14.8 % — AB (ref 11.5–14.5)
WBC: 13.4 10*3/uL — ABNORMAL HIGH (ref 3.6–11.0)

## 2014-04-03 LAB — CBC WITH DIFFERENTIAL/PLATELET
Basophil #: 0.1 10*3/uL (ref 0.0–0.1)
Basophil %: 0.7 %
Eosinophil #: 0.3 10*3/uL (ref 0.0–0.7)
Eosinophil %: 4 %
HCT: 36.6 % (ref 35.0–47.0)
HGB: 12.1 g/dL (ref 12.0–16.0)
LYMPHS ABS: 1.2 10*3/uL (ref 1.0–3.6)
LYMPHS PCT: 15 %
MCH: 36.1 pg — ABNORMAL HIGH (ref 26.0–34.0)
MCHC: 33 g/dL (ref 32.0–36.0)
MCV: 109 fL — ABNORMAL HIGH (ref 80–100)
MONOS PCT: 9.6 %
Monocyte #: 0.8 x10 3/mm (ref 0.2–0.9)
Neutrophil #: 5.6 10*3/uL (ref 1.4–6.5)
Neutrophil %: 70.7 %
Platelet: 175 10*3/uL (ref 150–440)
RBC: 3.35 10*6/uL — ABNORMAL LOW (ref 3.80–5.20)
RDW: 14.8 % — AB (ref 11.5–14.5)
WBC: 8 10*3/uL (ref 3.6–11.0)

## 2014-04-03 LAB — BASIC METABOLIC PANEL
Anion Gap: 7 (ref 7–16)
BUN: 9 mg/dL (ref 7–18)
Calcium, Total: 8.1 mg/dL — ABNORMAL LOW (ref 8.5–10.1)
Chloride: 106 mmol/L (ref 98–107)
Co2: 26 mmol/L (ref 21–32)
Creatinine: 1.22 mg/dL (ref 0.60–1.30)
EGFR (African American): 53 — ABNORMAL LOW
GFR CALC NON AF AMER: 46 — AB
GLUCOSE: 109 mg/dL — AB (ref 65–99)
Osmolality: 277 (ref 275–301)
POTASSIUM: 4 mmol/L (ref 3.5–5.1)
Sodium: 139 mmol/L (ref 136–145)

## 2014-04-05 ENCOUNTER — Encounter: Payer: Self-pay | Admitting: General Surgery

## 2014-04-06 HISTORY — PX: HERNIA REPAIR: SHX51

## 2014-04-07 ENCOUNTER — Encounter: Payer: Self-pay | Admitting: General Surgery

## 2014-04-07 ENCOUNTER — Ambulatory Visit (INDEPENDENT_AMBULATORY_CARE_PROVIDER_SITE_OTHER): Payer: Self-pay | Admitting: General Surgery

## 2014-04-07 VITALS — BP 122/70 | HR 72 | Resp 14 | Ht 62.0 in | Wt 174.0 lb

## 2014-04-07 DIAGNOSIS — Z8542 Personal history of malignant neoplasm of other parts of uterus: Secondary | ICD-10-CM

## 2014-04-07 DIAGNOSIS — K439 Ventral hernia without obstruction or gangrene: Secondary | ICD-10-CM

## 2014-04-07 DIAGNOSIS — IMO0002 Reserved for concepts with insufficient information to code with codable children: Secondary | ICD-10-CM

## 2014-04-07 DIAGNOSIS — Z86010 Personal history of colonic polyps: Secondary | ICD-10-CM

## 2014-04-07 DIAGNOSIS — K8020 Calculus of gallbladder without cholecystitis without obstruction: Secondary | ICD-10-CM

## 2014-04-07 DIAGNOSIS — K435 Parastomal hernia without obstruction or  gangrene: Secondary | ICD-10-CM

## 2014-04-07 NOTE — Patient Instructions (Addendum)
Ventral Hernia A ventral hernia (also called an incisional hernia) is a hernia that occurs at the site of a previous surgical cut (incision) in the abdomen. The abdominal wall spans from your lower chest down to your pelvis. If the abdominal wall is weakened from a surgical incision, a hernia can occur. A hernia is a bulge of bowel or muscle tissue pushing out on the weakened part of the abdominal wall. Ventral hernias can get bigger from straining or lifting. Obese and older people are at higher risk for a ventral hernia. People who develop infections after surgery or require repeat incisions at the same site on the abdomen are also at increased risk. CAUSES  A ventral hernia occurs because of weakness in the abdominal wall at an incision site.  SYMPTOMS  Common symptoms include:  A visible bulge or lump on the abdominal wall.  Pain or tenderness around the lump.  Increased discomfort if you cough or make a sudden movement. If the hernia has blocked part of the intestine, a serious complication can occur (incarcerated or strangulated hernia). This can become a problem that requires emergency surgery because the blood flow to the blocked intestine may be cut off. Symptoms may include:  Feeling sick to your stomach (nauseous).  Throwing up (vomiting).  Stomach swelling (distention) or bloating.  Fever.  Rapid heartbeat. DIAGNOSIS  Your health care provider will take a medical history and perform a physical exam. Various tests may be ordered, such as:  Blood tests.  Urine tests.  Ultrasonography.  X-rays.  Computed tomography (CT). TREATMENT  Watchful waiting may be all that is needed for a smaller hernia that does not cause symptoms. Your health care provider may recommend the use of a supportive belt (truss) that helps to keep the abdominal wall intact. For larger hernias or those that cause pain, surgery to repair the hernia is usually recommended. If a hernia becomes  strangulated, emergency surgery needs to be done right away. HOME CARE INSTRUCTIONS  Avoid putting pressure or strain on the abdominal area.  Avoid heavy lifting.  Use good body positioning for physical tasks. Ask your health care provider about proper body positioning.  Use a supportive belt as directed by your health care provider.  Maintain a healthy weight.  Eat foods that are high in fiber, such as whole grains, fruits, and vegetables. Fiber helps prevent difficult bowel movements (constipation).  Drink enough fluids to keep your urine clear or pale yellow.  Follow up with your health care provider as directed. SEEK MEDICAL CARE IF:   Your hernia seems to be getting larger or more painful. SEEK IMMEDIATE MEDICAL CARE IF:   You have abdominal pain that is sudden and sharp.  Your pain becomes severe.  You have repeated vomiting.  You are sweating a lot.  You notice a rapid heartbeat.  You develop a fever. MAKE SURE YOU:   Understand these instructions.  Will watch your condition.  Will get help right away if you are not doing well or get worse. Document Released: 08/06/2012 Document Revised: 01/04/2014 Document Reviewed: 08/06/2012 [facility] Patient Information 2015 [place], [place]. This information is not intended to replace advice given to you by your health care provider. Make sure you discuss any questions you have with your health care provider.  Patient's surgery has been scheduled for 05-07-14 at [facility].

## 2014-04-07 NOTE — Progress Notes (Signed)
Patient ID: [name], female   DOB: July 02, 1946, 68 y.o.   MRN: [id number]  Chief Complaint  Patient presents with  . Follow-up    colonoscopy    HPI [name] is a 68 y.o. female here today for her follow up colonoscopy done on 03/31/14. The procedure was complicated by contained perforation treated with IV antibiotics without obvious sequela. Patient states she is doing well.  HPI  Past Medical History  Diagnosis Date  . Diverticulitis of colon (without mention of hemorrhage)   . Mechanical complication of colostomy and enterostomy   . Hypertension   . Cancer 2011    endometrial  . Personal history of tobacco use, presenting hazards to health   . History of kidney stones 2011  . Obesity, unspecified     Past Surgical History  Procedure Laterality Date  . Appendectomy    . Lithotripsy  2009  . Tubal ligation    . Pyelolithotomy    . Colostomy  2011  . Abdominal hysterectomy  2011  . Dilation and curettage of uterus    . Mole removal  2003  . Hysteroscopy  2011  . Colonoscopy  June 2010,03/31/14    Tubular adenoma of the transverse colon, hyperplastic polyps of the sigmoid.  . 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 Colon surgery  05/20/1999    Drainage of pelvic abscess, end colostomy  . Colon surgery  11/15/2010    Revision of colostomy stoma  . Skin cancer removal  02/2014    Family History  Problem Relation Age of Onset  . Breast cancer Maternal Grandmother   . Colon cancer Mother     Social History History  Substance Use Topics  . Smoking status: Current Every Day Smoker -- 15 years  . Smokeless tobacco: Never Used  . Alcohol Use: Yes    No Known Allergies  Current Outpatient Prescriptions  Medication Sig Dispense Refill  . polyethylene glycol powder (GLYCOLAX/MIRALAX) powder 255 grams one bottle for colonoscopy prep  255 g  0   No current  facility-administered medications for this visit.    Review of Systems Review of Systems  Blood pressure 122/70, pulse 72, resp. rate 14, height 5\' 2"  (1.575 m), weight 174 lb (78.926 kg).  Physical Exam Physical Exam  Constitutional: She is oriented to person, place, and time. She appears well-developed and well-nourished.  Eyes: Conjunctivae are normal.  Neck: Neck supple.  Cardiovascular: Normal rate, regular rhythm and normal heart sounds.   Pulmonary/Chest: Effort normal and breath sounds normal.  Abdominal: Soft. Bowel sounds are normal. There is no tenderness.    Neurological: She is alert and oriented to person, place, and time.  Skin: Skin is warm and dry.    Data Reviewed The proximal colon was not evaluated during the colonoscopy due to the above noted perforation. Examination of the rectal segment was unremarkable.  Assessment    Multiple ventral hernias.  Past history diverticulitis.      Plan    The patient's hernias or enlarging, and repair as appropriate. Options to have this completed and a specialty Center were discussed with the patient and her husband and declined.  She'll require a component separation. We discussed whether the colostomy should be taken down (if possible) and at this time she is not interested in colostomy takedown.  She has been identified with gallstones in the past, and it was recommended  a cholecystectomy be completed if feasible to minimize the possibility or need of future intra-abdominal procedures. This was reluctantly agreed to.  The risks associated with the procedure including those related to bleeding, infection and recurrent herniation a been reviewed.  Smoking cessation was reviewed by phone post visit. She is converting  E- cigarettes and will try to minimize her smoking as best as possible.   Patient's surgery has been scheduled for 05-07-14 at [facility].     PCP: Paulina Fusi 04/09/2014, 9:13  AM

## 2014-04-09 ENCOUNTER — Other Ambulatory Visit: Payer: Self-pay | Admitting: General Surgery

## 2014-04-09 DIAGNOSIS — Z8542 Personal history of malignant neoplasm of other parts of uterus: Secondary | ICD-10-CM | POA: Insufficient documentation

## 2014-04-09 DIAGNOSIS — K439 Ventral hernia without obstruction or gangrene: Secondary | ICD-10-CM

## 2014-04-09 DIAGNOSIS — K8020 Calculus of gallbladder without cholecystitis without obstruction: Secondary | ICD-10-CM

## 2014-04-09 DIAGNOSIS — K435 Parastomal hernia without obstruction or  gangrene: Secondary | ICD-10-CM

## 2014-04-27 ENCOUNTER — Telehealth: Payer: Self-pay | Admitting: *Deleted

## 2014-04-27 NOTE — Telephone Encounter (Signed)
Pt called and wants to talk to you about billing to insurance company

## 2014-04-29 ENCOUNTER — Encounter: Payer: Self-pay | Admitting: General Surgery

## 2014-04-29 ENCOUNTER — Ambulatory Visit: Payer: Self-pay | Admitting: General Surgery

## 2014-04-29 LAB — COMPREHENSIVE METABOLIC PANEL
Albumin: 3.4 g/dL (ref 3.4–5.0)
Alkaline Phosphatase: 92 U/L
Anion Gap: 12 (ref 7–16)
BUN: 32 mg/dL — AB (ref 7–18)
Bilirubin,Total: 0.5 mg/dL (ref 0.2–1.0)
Calcium, Total: 8.8 mg/dL (ref 8.5–10.1)
Chloride: 108 mmol/L — ABNORMAL HIGH (ref 98–107)
Co2: 22 mmol/L (ref 21–32)
Creatinine: 1.41 mg/dL — ABNORMAL HIGH (ref 0.60–1.30)
EGFR (Non-African Amer.): 38 — ABNORMAL LOW
GFR CALC AF AMER: 45 — AB
GLUCOSE: 86 mg/dL (ref 65–99)
OSMOLALITY: 289 (ref 275–301)
Potassium: 4.1 mmol/L (ref 3.5–5.1)
SGOT(AST): 21 U/L (ref 15–37)
SGPT (ALT): 18 U/L
Sodium: 142 mmol/L (ref 136–145)
Total Protein: 7.7 g/dL (ref 6.4–8.2)

## 2014-04-29 LAB — CBC WITH DIFFERENTIAL/PLATELET
BASOS ABS: 0.1 10*3/uL (ref 0.0–0.1)
BASOS PCT: 1.2 %
EOS PCT: 1.3 %
Eosinophil #: 0.2 10*3/uL (ref 0.0–0.7)
HCT: 43.5 % (ref 35.0–47.0)
HGB: 14.3 g/dL (ref 12.0–16.0)
Lymphocyte #: 2.5 10*3/uL (ref 1.0–3.6)
Lymphocyte %: 19.8 %
MCH: 35.4 pg — AB (ref 26.0–34.0)
MCHC: 32.8 g/dL (ref 32.0–36.0)
MCV: 108 fL — AB (ref 80–100)
MONO ABS: 1.1 x10 3/mm — AB (ref 0.2–0.9)
Monocyte %: 8.3 %
Neutrophil #: 8.8 10*3/uL — ABNORMAL HIGH (ref 1.4–6.5)
Neutrophil %: 69.4 %
Platelet: 254 10*3/uL (ref 150–440)
RBC: 4.02 10*6/uL (ref 3.80–5.20)
RDW: 15.4 % — AB (ref 11.5–14.5)
WBC: 12.7 10*3/uL — AB (ref 3.6–11.0)

## 2014-04-30 ENCOUNTER — Telehealth: Payer: Self-pay

## 2014-04-30 ENCOUNTER — Encounter: Payer: Self-pay | Admitting: General Surgery

## 2014-04-30 NOTE — Telephone Encounter (Signed)
[name] from pre admission testing at [facility] called and said that the patient had several abnormal lab values and would need a medical clearance from her PCP prior to surgery scheduled for 05/07/14. I contacted the patient and let her know about this and she is going to schedule herself an appointment to see [name] her PCP. I have faxed over the form for medical clearance to her PCP office.

## 2014-05-07 ENCOUNTER — Inpatient Hospital Stay: Payer: Self-pay | Admitting: General Surgery

## 2014-05-07 DIAGNOSIS — K439 Ventral hernia without obstruction or gangrene: Secondary | ICD-10-CM

## 2014-05-07 DIAGNOSIS — K8010 Calculus of gallbladder with chronic cholecystitis without obstruction: Secondary | ICD-10-CM

## 2014-05-07 HISTORY — PX: CHOLECYSTECTOMY: SHX55

## 2014-05-08 LAB — CREATININE, SERUM
Creatinine: 1.74 mg/dL — ABNORMAL HIGH (ref 0.60–1.30)
EGFR (Non-African Amer.): 30 — ABNORMAL LOW
GFR CALC AF AMER: 35 — AB

## 2014-05-09 DIAGNOSIS — I359 Nonrheumatic aortic valve disorder, unspecified: Secondary | ICD-10-CM

## 2014-05-09 LAB — CK-MB
CK-MB: 2.7 ng/mL (ref 0.5–3.6)
CK-MB: 2.2 ng/mL (ref 0.5–3.6)

## 2014-05-09 LAB — CBC WITH DIFFERENTIAL/PLATELET
Basophil #: 0.1 10*3/uL (ref 0.0–0.1)
Basophil %: 0.6 %
EOS ABS: 0.1 10*3/uL (ref 0.0–0.7)
Eosinophil %: 0.5 %
HCT: 37.7 % (ref 35.0–47.0)
HGB: 12.2 g/dL (ref 12.0–16.0)
LYMPHS PCT: 7.9 %
Lymphocyte #: 1.2 10*3/uL (ref 1.0–3.6)
MCH: 35.7 pg — ABNORMAL HIGH (ref 26.0–34.0)
MCHC: 32.5 g/dL (ref 32.0–36.0)
MCV: 110 fL — ABNORMAL HIGH (ref 80–100)
MONOS PCT: 7.1 %
Monocyte #: 1 x10 3/mm — ABNORMAL HIGH (ref 0.2–0.9)
Neutrophil #: 12.4 10*3/uL — ABNORMAL HIGH (ref 1.4–6.5)
Neutrophil %: 83.9 %
Platelet: 181 10*3/uL (ref 150–440)
RBC: 3.43 10*6/uL — ABNORMAL LOW (ref 3.80–5.20)
RDW: 15.5 % — AB (ref 11.5–14.5)
WBC: 14.7 10*3/uL — AB (ref 3.6–11.0)

## 2014-05-09 LAB — BASIC METABOLIC PANEL
Anion Gap: 7 (ref 7–16)
BUN: 25 mg/dL — ABNORMAL HIGH (ref 7–18)
Calcium, Total: 7.9 mg/dL — ABNORMAL LOW (ref 8.5–10.1)
Chloride: 101 mmol/L (ref 98–107)
Co2: 25 mmol/L (ref 21–32)
Creatinine: 1.32 mg/dL — ABNORMAL HIGH (ref 0.60–1.30)
EGFR (African American): 48 — ABNORMAL LOW
EGFR (Non-African Amer.): 42 — ABNORMAL LOW
GLUCOSE: 117 mg/dL — AB (ref 65–99)
Osmolality: 272 (ref 275–301)
POTASSIUM: 4.1 mmol/L (ref 3.5–5.1)
Sodium: 133 mmol/L — ABNORMAL LOW (ref 136–145)

## 2014-05-09 LAB — TROPONIN I
TROPONIN-I: 0.11 ng/mL — AB
Troponin-I: 0.09 ng/mL — ABNORMAL HIGH

## 2014-05-09 LAB — MAGNESIUM: MAGNESIUM: 1.1 mg/dL — AB

## 2014-05-09 LAB — TSH: Thyroid Stimulating Horm: 2.55 u[IU]/mL

## 2014-05-10 LAB — BASIC METABOLIC PANEL
ANION GAP: 10 (ref 7–16)
BUN: 27 mg/dL — AB (ref 7–18)
Calcium, Total: 9.2 mg/dL (ref 8.5–10.1)
Chloride: 96 mmol/L — ABNORMAL LOW (ref 98–107)
Co2: 30 mmol/L (ref 21–32)
Creatinine: 1.43 mg/dL — ABNORMAL HIGH (ref 0.60–1.30)
GFR CALC AF AMER: 44 — AB
GFR CALC NON AF AMER: 38 — AB
GLUCOSE: 157 mg/dL — AB (ref 65–99)
Osmolality: 280 (ref 275–301)
POTASSIUM: 4.4 mmol/L (ref 3.5–5.1)
SODIUM: 136 mmol/L (ref 136–145)

## 2014-05-10 LAB — CBC WITH DIFFERENTIAL/PLATELET
BASOS PCT: 0.3 %
Basophil #: 0.1 10*3/uL (ref 0.0–0.1)
EOS ABS: 0 10*3/uL (ref 0.0–0.7)
Eosinophil %: 0.2 %
HCT: 41.8 % (ref 35.0–47.0)
HGB: 13.7 g/dL (ref 12.0–16.0)
LYMPHS PCT: 3.9 %
Lymphocyte #: 0.7 10*3/uL — ABNORMAL LOW (ref 1.0–3.6)
MCH: 35.4 pg — AB (ref 26.0–34.0)
MCHC: 32.7 g/dL (ref 32.0–36.0)
MCV: 108 fL — ABNORMAL HIGH (ref 80–100)
MONOS PCT: 6.4 %
Monocyte #: 1.1 x10 3/mm — ABNORMAL HIGH (ref 0.2–0.9)
NEUTROS PCT: 89.2 %
Neutrophil #: 15.3 10*3/uL — ABNORMAL HIGH (ref 1.4–6.5)
Platelet: 247 10*3/uL (ref 150–440)
RBC: 3.86 10*6/uL (ref 3.80–5.20)
RDW: 15.2 % — AB (ref 11.5–14.5)
WBC: 17.2 10*3/uL — ABNORMAL HIGH (ref 3.6–11.0)

## 2014-05-10 LAB — HEPATIC FUNCTION PANEL A (ARMC)
Albumin: 2.4 g/dL — ABNORMAL LOW (ref 3.4–5.0)
Alkaline Phosphatase: 106 U/L
Bilirubin, Direct: 0.1 mg/dL (ref 0.00–0.20)
Bilirubin,Total: 0.7 mg/dL (ref 0.2–1.0)
SGOT(AST): 38 U/L — ABNORMAL HIGH (ref 15–37)
SGPT (ALT): 22 U/L
TOTAL PROTEIN: 6.8 g/dL (ref 6.4–8.2)

## 2014-05-10 LAB — CK-MB: CK-MB: 2 ng/mL (ref 0.5–3.6)

## 2014-05-10 LAB — TROPONIN I: TROPONIN-I: 0.07 ng/mL — AB

## 2014-05-10 LAB — MAGNESIUM: Magnesium: 2.1 mg/dL

## 2014-05-11 ENCOUNTER — Encounter: Payer: Self-pay | Admitting: General Surgery

## 2014-05-11 LAB — CBC WITH DIFFERENTIAL/PLATELET
BASOS ABS: 0.1 10*3/uL (ref 0.0–0.1)
BASOS PCT: 0.6 %
EOS ABS: 0.2 10*3/uL (ref 0.0–0.7)
EOS PCT: 2.1 %
HCT: 33.7 % — ABNORMAL LOW (ref 35.0–47.0)
HGB: 11.3 g/dL — AB (ref 12.0–16.0)
LYMPHS PCT: 9.1 %
Lymphocyte #: 0.8 10*3/uL — ABNORMAL LOW (ref 1.0–3.6)
MCH: 36 pg — AB (ref 26.0–34.0)
MCHC: 33.4 g/dL (ref 32.0–36.0)
MCV: 108 fL — AB (ref 80–100)
MONO ABS: 1.1 x10 3/mm — AB (ref 0.2–0.9)
Monocyte %: 12.5 %
NEUTROS ABS: 6.6 10*3/uL — AB (ref 1.4–6.5)
Neutrophil %: 75.7 %
PLATELETS: 238 10*3/uL (ref 150–440)
RBC: 3.12 10*6/uL — ABNORMAL LOW (ref 3.80–5.20)
RDW: 15.1 % — AB (ref 11.5–14.5)
WBC: 8.7 10*3/uL (ref 3.6–11.0)

## 2014-05-11 LAB — BASIC METABOLIC PANEL
Anion Gap: 7 (ref 7–16)
BUN: 26 mg/dL — ABNORMAL HIGH (ref 7–18)
CHLORIDE: 101 mmol/L (ref 98–107)
CREATININE: 1.2 mg/dL (ref 0.60–1.30)
Calcium, Total: 8.3 mg/dL — ABNORMAL LOW (ref 8.5–10.1)
Co2: 30 mmol/L (ref 21–32)
EGFR (Non-African Amer.): 47 — ABNORMAL LOW
GFR CALC AF AMER: 54 — AB
Glucose: 84 mg/dL (ref 65–99)
Osmolality: 280 (ref 275–301)
Potassium: 3.9 mmol/L (ref 3.5–5.1)
Sodium: 138 mmol/L (ref 136–145)

## 2014-05-11 LAB — PATHOLOGY REPORT

## 2014-05-12 ENCOUNTER — Encounter: Payer: Self-pay | Admitting: General Surgery

## 2014-05-13 LAB — BASIC METABOLIC PANEL
Anion Gap: 5 — ABNORMAL LOW (ref 7–16)
BUN: 22 mg/dL — ABNORMAL HIGH (ref 7–18)
CHLORIDE: 102 mmol/L (ref 98–107)
Calcium, Total: 7.9 mg/dL — ABNORMAL LOW (ref 8.5–10.1)
Co2: 31 mmol/L (ref 21–32)
Creatinine: 1.25 mg/dL (ref 0.60–1.30)
EGFR (African American): 52 — ABNORMAL LOW
EGFR (Non-African Amer.): 44 — ABNORMAL LOW
GLUCOSE: 115 mg/dL — AB (ref 65–99)
Osmolality: 280 (ref 275–301)
POTASSIUM: 3.7 mmol/L (ref 3.5–5.1)
Sodium: 138 mmol/L (ref 136–145)

## 2014-05-13 LAB — CBC WITH DIFFERENTIAL/PLATELET
BASOS ABS: 0 10*3/uL (ref 0.0–0.1)
Basophil %: 0.4 %
Eosinophil #: 0.2 10*3/uL (ref 0.0–0.7)
Eosinophil %: 3.1 %
HCT: 32.2 % — ABNORMAL LOW (ref 35.0–47.0)
HGB: 10.2 g/dL — ABNORMAL LOW (ref 12.0–16.0)
LYMPHS ABS: 1.2 10*3/uL (ref 1.0–3.6)
LYMPHS PCT: 15.1 %
MCH: 34.3 pg — AB (ref 26.0–34.0)
MCHC: 31.6 g/dL — ABNORMAL LOW (ref 32.0–36.0)
MCV: 109 fL — AB (ref 80–100)
MONO ABS: 1.2 x10 3/mm — AB (ref 0.2–0.9)
MONOS PCT: 14.8 %
NEUTROS ABS: 5.3 10*3/uL (ref 1.4–6.5)
Neutrophil %: 66.6 %
Platelet: 267 10*3/uL (ref 150–440)
RBC: 2.97 10*6/uL — ABNORMAL LOW (ref 3.80–5.20)
RDW: 14.7 % — ABNORMAL HIGH (ref 11.5–14.5)
WBC: 8 10*3/uL (ref 3.6–11.0)

## 2014-05-13 LAB — MAGNESIUM: Magnesium: 1.3 mg/dL — ABNORMAL LOW

## 2014-05-19 ENCOUNTER — Ambulatory Visit (INDEPENDENT_AMBULATORY_CARE_PROVIDER_SITE_OTHER): Payer: Self-pay | Admitting: *Deleted

## 2014-05-19 ENCOUNTER — Ambulatory Visit: Payer: No Typology Code available for payment source

## 2014-05-19 DIAGNOSIS — K439 Ventral hernia without obstruction or gangrene: Secondary | ICD-10-CM

## 2014-05-19 NOTE — Progress Notes (Signed)
Patient came in today for a wound check.  The wound is clean, with no signs of infection noted. Staples removed and steri strips applied.  Pt aware to support abdomen when coughing. Follow up as scheduled.

## 2014-05-19 NOTE — Patient Instructions (Signed)
Follow up as scheduled.  

## 2014-05-24 ENCOUNTER — Ambulatory Visit (INDEPENDENT_AMBULATORY_CARE_PROVIDER_SITE_OTHER): Payer: Self-pay | Admitting: General Surgery

## 2014-05-24 ENCOUNTER — Encounter: Payer: Self-pay | Admitting: General Surgery

## 2014-05-24 ENCOUNTER — Ambulatory Visit: Payer: No Typology Code available for payment source | Admitting: General Surgery

## 2014-05-24 VITALS — BP 130/72 | HR 72 | Resp 12 | Ht 62.0 in | Wt 174.0 lb

## 2014-05-24 DIAGNOSIS — I471 Supraventricular tachycardia: Secondary | ICD-10-CM | POA: Insufficient documentation

## 2014-05-24 DIAGNOSIS — K439 Ventral hernia without obstruction or gangrene: Secondary | ICD-10-CM

## 2014-05-24 NOTE — Patient Instructions (Signed)
Patient to return one month.  

## 2014-05-24 NOTE — Progress Notes (Signed)
Patient ID: [name], female   DOB: [birth year], 68 y.o.   MRN: [id number]  Chief Complaint  Patient presents with  . Routine Post Op    HPI [name] is a 68 y.o. female here today for her post op ventral hernia repair and gallbladder surgery done on 05/07/14. Patient states she is doing well. Patient states both legs developed swelling towards the end of the day last week, markedly improved this week. Her appetite is slowly improving. She reports no difficulty, in fact improved stoma function. Rare postprandial discomfort. She developed gastric distention postoperatively requiring nasogastric drainage, and she was placed on oral Reglan during her hospitalization to prevent recurrence. This was to be on a q.i.d. basis, she has decreased his on her own initiative to b.i.d.  She been discharged home on Lopressor b.i.d. Which she had decreased to a q.d. dose. HPI  Past Medical History  Diagnosis Date  . Diverticulitis of colon (without mention of hemorrhage)   . Mechanical complication of colostomy and enterostomy   . Hypertension   . Cancer 2011    endometrial  . Personal history of tobacco use, presenting hazards to health   . History of kidney stones 2011  . Obesity, unspecified     Past Surgical History  Procedure Laterality Date  . Appendectomy    . Lithotripsy  2009  . Tubal ligation    . Pyelolithotomy    . Colostomy  2011  . Abdominal hysterectomy  2011  . Dilation and curettage of uterus    . Mole removal  2003  . Hysteroscopy  2011  . Colonoscopy  June 2010,03/31/14    Tubular adenoma of the transverse colon, hyperplastic polyps of the sigmoid.  . 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 Colon surgery  05/20/1999    Drainage of pelvic abscess, end colostomy  . Colon surgery  11/15/2010    Revision of colostomy stoma  . Skin cancer removal  02/2014  . Cholecystectomy  05/07/14   . Hernia repair  04/06/14    ventral hernia    Family History  Problem Relation Age of Onset  . Breast cancer Maternal Grandmother   . Colon cancer Mother     Social History History  Substance Use Topics  . Smoking status: Current Every Day Smoker -- 15 years  . Smokeless tobacco: Never Used  . Alcohol Use: Yes    No Known Allergies  Current Outpatient Prescriptions  Medication Sig Dispense Refill  . metoCLOPramide (REGLAN) 5 MG tablet       . metoprolol tartrate (LOPRESSOR) 25 MG tablet Take 25 mg by mouth 2 (two) times daily.       . polyethylene glycol powder (GLYCOLAX/MIRALAX) powder 255 grams one bottle for colonoscopy prep  255 g  0   No current facility-administered medications for this visit.    Review of Systems Review of Systems  Constitutional: Negative.   Respiratory: Negative.   Cardiovascular: Negative.     Blood pressure 130/72, pulse 72, resp. rate 12, height 5\' 2"  (1.575 m), weight 174 lb (78.926 kg).  Physical Exam Physical Exam  Constitutional: She is oriented to person, place, and time. She appears well-nourished.  Neck: Neck supple.  Abdominal: Soft. Normal appearance and bowel sounds are normal. There is no tenderness.    The midline incision is healing well. The lateral port sites Where trans-fascial sutures were placed showed no  evidence of inflammation or infection. The stoma is healthy. No evidence of [place].  Musculoskeletal:       Feet:  Neurological: She is alert and oriented to person, place, and time.  Skin: Skin is warm and dry.       Assessment    Doing well post component separation repair of ventral hernia.  No evidence of recurrent SVT.  Mild lower extremity edema, improving.     Plan    Will plan to continue a Daily Lopressor dose for one month. She'll decrease her Reglan to an morning dose for the next week or 2 and she can discontinue her proton pump inhibitor.     PCP: Paulina Fusi 05/24/2014, 10:21 PM

## 2014-06-24 ENCOUNTER — Encounter: Payer: Self-pay | Admitting: General Surgery

## 2014-06-24 ENCOUNTER — Ambulatory Visit (INDEPENDENT_AMBULATORY_CARE_PROVIDER_SITE_OTHER): Payer: Self-pay | Admitting: General Surgery

## 2014-06-24 VITALS — BP 130/70 | HR 78 | Resp 12 | Ht 62.0 in | Wt 170.0 lb

## 2014-06-24 DIAGNOSIS — K439 Ventral hernia without obstruction or gangrene: Secondary | ICD-10-CM

## 2014-06-24 NOTE — Patient Instructions (Addendum)
The patient is aware to call back for any questions or concerns. Gradually increase activity. 

## 2014-06-24 NOTE — Progress Notes (Signed)
Patient ID: [name], female   DOB: 1945/10/17, 68 y.o.   MRN: [id number]  Chief Complaint  Patient presents with  . Routine Post Op    hernia    HPI [name] is a 68 y.o. female.  Here today for her two month postoperative visit, ventral hernia on 04-06-14. States she is doing well. Bowels moving regular via ostomy. Stoma continues to function better than before repair of para-stomal hernia.  No issues today. Accompanied by her husband.    HPI  Past Medical History  Diagnosis Date  . Diverticulitis of colon (without mention of hemorrhage)   . Mechanical complication of colostomy and enterostomy   . Hypertension   . Cancer 2011    endometrial  . Personal history of tobacco use, presenting hazards to health   . History of kidney stones 2011  . Obesity, unspecified     Past Surgical History  Procedure Laterality Date  . Appendectomy    . Lithotripsy  2009  . Tubal ligation    . Pyelolithotomy    . Colostomy  2011  . Abdominal hysterectomy  2011  . Dilation and curettage of uterus    . Mole removal  2003  . Hysteroscopy  2011  . Colonoscopy  June 2010,03/31/14    Tubular adenoma of the transverse colon, hyperplastic polyps of the sigmoid.  . 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 Colon surgery  05/20/1999    Drainage of pelvic abscess, end colostomy  . Colon surgery  11/15/2010    Revision of colostomy stoma  . Skin cancer removal  02/2014  . Cholecystectomy  05/07/14  . Hernia repair  04/06/14    ventral hernia    Family History  Problem Relation Age of Onset  . Breast cancer Maternal Grandmother   . Colon cancer Mother     Social History History  Substance Use Topics  . Smoking status: Current Every Day Smoker -- 15 years  . Smokeless tobacco: Never Used  . Alcohol Use: Yes    No Known Allergies  No current outpatient prescriptions on file.   No current  facility-administered medications for this visit.    Review of Systems Review of Systems  Constitutional: Negative.   Respiratory: Negative.   Cardiovascular: Negative.     Blood pressure 130/70, pulse 78, resp. rate 12, height 5\' 2"  (1.575 m), weight 170 lb (77.111 kg).  Physical Exam Physical Exam  Constitutional: She is oriented to person, place, and time. She appears well-developed and well-nourished.  Neck: Neck supple.  Cardiovascular: Normal rate, regular rhythm and normal heart sounds.   Pulmonary/Chest: Effort normal and breath sounds normal.  Abdominal: Soft. Bowel sounds are normal. There is no tenderness.  Well healed abdominal incision. No weakness noted when examined supine with legs elevated off the table or in the standing position.   The area just above the pubis shows no weakness. (Just below her modest panus).  Lymphadenopathy:    She has no cervical adenopathy.  Neurological: She is alert and oriented to person, place, and time.  Skin: Skin is warm and dry.       Assessment    Doing well post component separation repair of recurrent ventral and para-stomal hernias.     Plan         Gradually increase activity as tolerated. Proper lifting technique reviewed. Follow up in 3 months.  PCP: Ronnald Ramp,  Belva Bertin 06/27/2014, 9:33 AM

## 2014-07-23 ENCOUNTER — Ambulatory Visit: Payer: Self-pay | Admitting: Oncology

## 2014-07-23 LAB — COMPREHENSIVE METABOLIC PANEL
ALT: 20 U/L
AST: 19 U/L (ref 15–37)
Albumin: 3.6 g/dL (ref 3.4–5.0)
Alkaline Phosphatase: 95 U/L
Anion Gap: 15 (ref 7–16)
BUN: 26 mg/dL — AB (ref 7–18)
Bilirubin,Total: 0.6 mg/dL (ref 0.2–1.0)
CO2: 25 mmol/L (ref 21–32)
CREATININE: 1.42 mg/dL — AB (ref 0.60–1.30)
Calcium, Total: 9.3 mg/dL (ref 8.5–10.1)
Chloride: 103 mmol/L (ref 98–107)
EGFR (Non-African Amer.): 39 — ABNORMAL LOW
GFR CALC AF AMER: 48 — AB
Glucose: 106 mg/dL — ABNORMAL HIGH (ref 65–99)
Osmolality: 290 (ref 275–301)
Potassium: 3.7 mmol/L (ref 3.5–5.1)
SODIUM: 143 mmol/L (ref 136–145)
Total Protein: 7.4 g/dL (ref 6.4–8.2)

## 2014-07-23 LAB — CBC CANCER CENTER
BASOS ABS: 0.1 x10 3/mm (ref 0.0–0.1)
BASOS PCT: 1.3 %
Eosinophil #: 0.1 x10 3/mm (ref 0.0–0.7)
Eosinophil %: 1.3 %
HCT: 47 % (ref 35.0–47.0)
HGB: 15.5 g/dL (ref 12.0–16.0)
Lymphocyte #: 2 x10 3/mm (ref 1.0–3.6)
Lymphocyte %: 20 %
MCH: 35.3 pg — ABNORMAL HIGH (ref 26.0–34.0)
MCHC: 32.9 g/dL (ref 32.0–36.0)
MCV: 107 fL — ABNORMAL HIGH (ref 80–100)
MONO ABS: 0.9 x10 3/mm (ref 0.2–0.9)
Monocyte %: 9.5 %
NEUTROS PCT: 67.9 %
Neutrophil #: 6.7 x10 3/mm — ABNORMAL HIGH (ref 1.4–6.5)
PLATELETS: 225 x10 3/mm (ref 150–440)
RBC: 4.39 10*6/uL (ref 3.80–5.20)
RDW: 16 % — AB (ref 11.5–14.5)
WBC: 9.9 x10 3/mm (ref 3.6–11.0)

## 2014-07-26 LAB — CA 125: CA 125: 23.6 U/mL (ref 0.0–34.0)

## 2014-08-03 ENCOUNTER — Ambulatory Visit: Payer: Self-pay | Admitting: Oncology

## 2014-09-16 ENCOUNTER — Ambulatory Visit: Payer: No Typology Code available for payment source | Admitting: General Surgery

## 2014-10-06 ENCOUNTER — Ambulatory Visit: Payer: No Typology Code available for payment source | Admitting: General Surgery

## 2014-10-20 ENCOUNTER — Ambulatory Visit (INDEPENDENT_AMBULATORY_CARE_PROVIDER_SITE_OTHER): Payer: No Typology Code available for payment source | Admitting: General Surgery

## 2014-10-20 ENCOUNTER — Encounter: Payer: Self-pay | Admitting: General Surgery

## 2014-10-20 VITALS — BP 140/80 | HR 76 | Resp 14 | Ht 62.0 in | Wt 168.0 lb

## 2014-10-20 DIAGNOSIS — K439 Ventral hernia without obstruction or gangrene: Secondary | ICD-10-CM

## 2014-10-20 NOTE — Progress Notes (Signed)
Patient ID: [name], female   DOB: November 30, 1945, 70 y.o.   MRN: [id number]  Chief Complaint  Patient presents with  . Follow-up    ventral hernia    HPI [name] is a 69 y.o. female here today for her three month ventral hernia repair done  04-06-14. States she is doing well. Bowels moving regular via ostomy. Stoma continues to function better than before repair of para-stomal hernia.  HPI  Past Medical History  Diagnosis Date  . Diverticulitis of colon (without mention of hemorrhage)   . Mechanical complication of colostomy and enterostomy   . Hypertension   . Cancer 2011    endometrial  . Personal history of tobacco use, presenting hazards to health   . History of kidney stones 2011  . Obesity, unspecified     Past Surgical History  Procedure Laterality Date  . Appendectomy    . Lithotripsy  2009  . Tubal ligation    . Pyelolithotomy    . Colostomy  2011  . Abdominal hysterectomy  2011  . Dilation and curettage of uterus    . Mole removal  2003  . Hysteroscopy  2011  . Colonoscopy  June 2010,03/31/14    Tubular adenoma of the transverse colon, hyperplastic polyps of the sigmoid.  . 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 Colon surgery  05/20/1999    Drainage of pelvic abscess, end colostomy  . Colon surgery  11/15/2010    Revision of colostomy stoma  . Skin cancer removal  02/2014  . Hernia repair  04/06/14    ventral hernia  . Cholecystectomy  05/07/14    Family History  Problem Relation Age of Onset  . Breast cancer Maternal Grandmother   . Colon cancer Mother     Social History History  Substance Use Topics  . Smoking status: Current Every Day Smoker -- 15 years  . Smokeless tobacco: Never Used  . Alcohol Use: Yes    No Known Allergies  No current outpatient prescriptions on file.   No current facility-administered medications for this visit.    Review  of Systems Review of Systems  Constitutional: Negative.   Respiratory: Negative.   Cardiovascular: Negative.     Blood pressure 140/80, pulse 76, resp. rate 14, height 5\' 2"  (1.575 m), weight 168 lb (76.204 kg).  Physical Exam Physical Exam  Constitutional: She is oriented to person, place, and time. She appears well-developed and well-nourished.  Eyes: Conjunctivae are normal. No scleral icterus.  Neck: Neck supple.  Cardiovascular: Normal rate, regular rhythm and normal heart sounds.   Pulmonary/Chest: Effort normal and breath sounds normal.  Abdominal: Soft. Bowel sounds are normal. There is no tenderness.    Lymphadenopathy:    She has no cervical adenopathy.  Neurological: She is alert and oriented to person, place, and time.  Skin: Skin is warm and dry.      Assessment    Doing well status post component separation repair of recurrent ventral hernia and parastomal hernia.  No adverse effects from cholecystectomy.    Plan    Patient to return as needed.      PCP:  Paulina Fusi 10/20/2014, 9:12 PM

## 2014-12-25 NOTE — Consult Note (Signed)
Brief Consult Note: Diagnosis: Tachycardia/ palpitation.   Patient was seen by consultant.   Consult note dictated.   Recommend further assessment or treatment.   Orders entered.   Discussed with Attending MD.   Comments: IMP  tachycardia  palpitation  postop hernia repair  mild obesity   mild ileus . PLAN NPO  postop  consider tachycardia telemetry  consider EKG if further palpitations  consider up postoperative beta-blockers if symptoms persist  DVT prophylaxis  consider correcting electrolytes.  Electronic Signatures: Lujean Amel D (MD)  (Signed 07-Sep-15 12:53)  Authored: Brief Consult Note   Last Updated: 07-Sep-15 12:53 by Yolonda Kida (MD)

## 2014-12-25 NOTE — Op Note (Signed)
PATIENT NAME:  [name], [name] MR#:  [id number] DATE OF BIRTH:  04/28/1946  DATE OF PROCEDURE:  05/07/2014  PREOPERATIVE DIAGNOSES:  1. Multiple ventral hernias.  2. Gallstones.   POSTOPERATIVE DIAGNOSES:  1. Multiple ventral hernias.  2. Gallstones.   OPERATIVE PROCEDURE: Exploratory celiotomy, bilateral myocutaneous flaps, cholecystectomy and placement of 25 and 33 retrorectus mesh.   SURGEON: Hervey Ard, MD.  ASSISTANT: Mckinley Jewel.   ANESTHESIA: General endotracheal under Dr. Myra Gianotti.   ESTIMATED BLOOD LOSS: 100 mL.   FLUID REPLACEMENT: 2000 mL crystalloid.   CLINICAL NOTE: This 69 year old woman had original abdominal exploration for endometrial cancer several years ago. She subsequently developed a colovaginal fistula. She underwent elective takedown, complicated by an anastomotic leakage approximately 10 days later. This resulted in an end-colostomy. She has developed multiple midline hernias as well as a small hernia lateral to the left upper quadrant stoma. Her hernias have been increasing in size and symptoms and she desired repair. She declined colostomy takedown. Gallstones were noted incidentally on preoperative evaluation and it was elected to proceed with cholecystectomy at the same time to minimize the risk for the need for later intervention. The patient had TED and SCD stockings for DVT prevention. She received Invanz intravenously.   OPERATIVE NOTE: After the induction of general endotracheal anesthesia, a Foley catheter was placed by the nurse without incident. The abdomen was prepped with ChloraPrep followed by the application of an Ioban II drape. A small gauze pad was placed over the stoma. The midline was opened from the area just below the xiphoid to just above the pubis. Skin was incised sharply and the remaining dissection completed with electrocautery. The hernia sacs were eventually entered and subsequently excised and discarded. The fascia was cleared.  Abdominal exploration showed moderate adhesions and these were taken down to free the small bowel and large bowel from contact with the anterior abdominal wall to the most lateral aspect of the abdomen bilaterally. A retractor was placed to provide exposure to the upper abdomen. The gallbladder was not noted to be inflamed. It was fairly closely adherent to the second portion of the duodenum. The gallbladder was freed circumferentially and the cystic duct controlled with a 2-0 silk tie, as were the 2 branches of the cystic artery. The gallbladder was removed from the liver bed, making use of hook cautery dissection and sent in formalin for routine histology. Inspection of the bed showed good hemostasis. Orogastric tube was placed by anesthesia to provide better visualization during the cholecystectomy and removed at the end of the procedure.   Attention was then turned towards closing the abdominal wall. The area of the posterior rectus sheath was incised for the length of the wound and the rectus muscle elevated superiorly bilaterally. With this, there was still significant tension on the midline and the initial approach was to complete a left myocutaneous component separation. The most distal aspect of the posterior sheath was opened, exposing the medial fibers of the transverse abdominis muscle. These were incised with cautery. This provided some advancement, but it was still incomplete and for that reason, a right side component separation in the same plane posterior to the transverse abdominis muscle was completed. This allowed fairly easy approximation of the posterior rectus sheath with a running 0 Vicryl suture in multiple segments. A few small rents in the posterior sheath were closed with similar suture. At this time, the wound was measured and it appeared that a 10 x 13, 25 x 35 cm  mesh would be appropriate. A ventral right ST mesh was chosen. This was brought onto the field and placed into the  retrorectus space created by the dissection above. This fit the space nicely, providing easily 5 cm of overlap superior and inferiorly to the fascial defects and good lateral coverage.   Multiple transfascial sutures of 0 PDS were placed, making use of a Reverdin needle with 4 sutures placed on the left side to bypass the area of the stoma and 3 on the right. The lateral slit for stoma passage was closed with a running 0 Prolene suture. The superior and medial aspects of the mesh were anchored into position with 0 Prolene sutures and drains were placed on both the right and left lower quadrants. The midline fascia was then approximated with interrupted 0 Prolene figure-of-8 sutures. A Blake drain was placed in the subcutaneous tissue due to the sizable hernia sac having been excised and this area was then closed with a running 2-0 Vicryl. The skin was closed with staples. Drains were anchored in place with 3-0 nylon sutures and placed to self suction. A new colostomy plate was placed and the patient taken to the recovery room extubated in stable condition.     ____________________________ Heather Bellow, MD jwb:TT D: 05/07/2014 17:18:13 ET T: 05/07/2014 17:46:49 ET JOB#: [id number]  cc: Heather Bellow, MD, <Dictator> Heather Patch, MD [name] Heather Kinsman MD ELECTRONICALLY SIGNED 05/07/2014 19:03

## 2014-12-25 NOTE — Discharge Summary (Signed)
PATIENT NAME:  EVERLEIGH, COLCLASURE MR#:  [id number] DATE OF BIRTH:  1946-08-12  DATE OF ADMISSION:  03/31/2014  DATE OF DISCHARGE:  04/03/2014   DISCHARGE DIAGNOSES:  Iatrogenic colonic injury.   POST-ADMISSION DIAGNOSIS: Iatrogenic colonic injury.    CLINICAL NOTE:  This 69 year old woman was undergoing a screening colonoscopy prior to a planned ventral hernia repair and suffered an iatrogenic perforation of the colon.  She was treated with IV antibiotics with rapid resolution of her abdominal tenderness and leukocytosis.  At the time of discharge, she was pain free and white blood cell count had fallen from 16,800 to 8000 with a normal differential.  No bleeding was noted. Renal function was unimpaired.  She did receive Invanz for antibiotic coverage.   The patient will be readmitted at a later date for ventral hernia repair.   At the time of discharge, she was sent home on Augmentin 875 mg p.o. b.i.d. to complete a 10 day course of antibiotic coverage.  She was to resume her regular medications including Norco if needed for pain.     ____________________________ [name], MD jwb:DT D: 04/26/2014 12:52:30 ET T: 04/26/2014 13:53:30 ET JOB#: [id number]  cc: [name], MD, <Dictator> Juline Patch, MD JEFFREY Amedeo Kinsman MD ELECTRONICALLY SIGNED 04/26/2014 21:59

## 2014-12-25 NOTE — H&P (Signed)
PATIENT NAME:  [name], WEBERG MR#:  [id number] DATE OF BIRTH:  07-27-1946  DATE OF ADMISSION:  03/31/2014  PREOPERATIVE DIAGNOSIS: Colonic injury during colonoscopy.   CLINICAL NOTE: This 70 year old woman had previously undergone a radical hysterectomy for endometrial cancer. 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 She underwent a primary resection and repair and subsequently dehisced requiring an urgent stoma placement. She developed a ventral hernia. Endoscopy had been planned to assess for the possibility of a re-anastomosis and repair of her multiple abdominal wall hernias.   The patient had undergone a formal bowel prep. During her endoscopy, it was evident that a diverticulum had been perforated at least into the mesentery. The patient's abdominal exam showed development of pain in about 3 hours post procedure. Plain films showed no free air under the diaphragm. She was admitted after receiving Invanz intravenously and endoscopy. She is admitted now for IV fluids, IV antibiotics and observation. Since the procedure was completed, the CT scan showed focal areas of free air. Her midline ventral hernia includes part of the stomach and a peristomal hernia includes part of the colon.   PHYSICAL EXAMINATION: VITAL SIGNS:  The patient's clinical exam has remained stable. She is afebrile with a normal pulse and blood pressure.  HEAD AND NECK: Unremarkable.  CHEST: Clear to auscultation.  CARDIAC: Regular rhythm without murmur or gallop.  ABDOMEN: Showed mild distention with normal bowel sounds. Mild tenderness especially in the left upper quadrant near the parastomal hernia site was evident.   The remaining exam was unremarkable.   PLAN:  The patient will be admitted, maintained n.p.o., except for ice chips, placed on Invanz IV and serial exams and laboratory studies will be obtained.     ____________________________ [name], MD jwb:ds D: 03/31/2014 22:09:52 ET T: 03/31/2014 22:29:41 ET JOB#: [id number]  cc: [name], MD, <Dictator> Juline Patch, MD Christopher Glasscock Amedeo Kinsman MD ELECTRONICALLY SIGNED 04/01/2014 10:22

## 2014-12-25 NOTE — Consult Note (Signed)
PATIENT NAME:  [name], [name] MR#:  [id number] DATE OF BIRTH:  10/03/1945  DATE OF CONSULTATION:  05/09/2014  REFERRING PHYSICIAN:  [name], MD CONSULTING PHYSICIAN:  Wiletta Bermingham H. Posey Pronto, MD  PRIMARY CARE PROVIDER: Juline Patch, MD   REASON FOR CONSULTATION: Opinion regarding the patient's tachycardia.   HISTORY OF PRESENT ILLNESS: The patient is a 69 year old white female who was hospitalized under Dr. Dwyane Luo service who has a history of ventral hernias and gallstone who underwent a cholecystectomy, bilateral mucocutaneous flap advancement, mesh repair of a ventral hernia who was doing well. She was started on a diet earlier today. She went to the bathroom and sat in the chair and started feeling very weak and dizzy. She got into the bed and her heart started beating very fast. For 5 minutes her heart rate was very elevated. She was not on telemetry so we were unable to capture what type of rhythm she was in. Now, she is feeling better. She did not have any chest pain, but she did feel her heart was beating fast. The patient denies any history of having any palpitations or similar type of presentation in the past. She is currently not nauseous or throwing up. Denies any diarrhea. The patient has been on 3 liters of oxygen since her surgery. She currently denies any shortness of breath.   PAST MEDICAL HISTORY: History of endometrial cancer in the past, history of kidney stones in the past. No history of hypertension or diabetes.   PAST SURGICAL HISTORY:  1.  Status post hysterectomy. 2.  Mole removal.  3.  Lithotripsy.  4.  Colostomy for colovaginal fistula.  5.  History of Port-A-Cath placement with removal.  6.  History of appendectomy. 7.  Status post D and C. 8.  Status post tubal ligation.   ALLERGIES: None.   CURRENT MEDICATIONS: She is on morphine q. 2 p.r.n. with a morphine PCA, promethazine 12.5 IV q. 4 p.r.n., Tylenol 1000 mg q. 6 p.r.n., Lovenox 30 mg q. 24.    SOCIAL HISTORY: She states that she is using vapor cigarettes. No alcohol or drug use.   FAMILY HISTORY: Positive for hypertension.   REVIEW OF SYSTEMS:\ CONSTITUTIONAL: Denies any fevers, chills. No weight loss. No weight gain.  HEENT: Denies any blurred vision, double vision. No cataracts, no glaucoma.  ENT: Denies any epistaxis. No difficulty with swallowing. No seasonal or year-round allergies. No difficulty with hearing. No tinnitus.  CARDIOVASCULAR: Complained of chest palpitation earlier today, but none now. No shortness of breath. No coronary artery disease. No dyspnea on exertion. No orthopnea.  PULMONARY: Denies any cough, wheezing. No dyspnea on exertion. No COPD, no TB, no pneumonia.  GASTROINTESTINAL: Denies any nausea, vomiting or diarrhea. No passage of gas yet. EXTREMITY: No clubbing, cyanosis, or edema.  SKIN: No rash.  LYMPHATICS: No lymph nodes palpable.  VASCULAR: Good DP, PT pulses.  PSYCHIATRIC: Not anxious or depressed.  NEUROLOGIC: Awake, alert, oriented x 3. No focal deficits. Cranial nerves II-XII grossly intact. Lymph nodes nonpalpable.  LABORATORY DATA: From earlier today, glucose 117, BUN 25. Creatinine from yesterday was 1.74. Sodium 133, potassium 4.1, chloride 101, CO2 of 25. Magnesium from her earlier today was 1.1. WBC 14.7, hemoglobin 12.2, platelet count 181,000.  IMAGING: EKG shows sinus tachycardia with PACs, low voltage QRS.   ASSESSMENT AND PLAN: The patient is a 69 year old white female status post hernia repair and laparoscopic cholecystectomy started feeling dizzy, her heart rate started beating fast, noted to have  a heart rate in the 200s. Now feeling better, heart rate improved.  1.  Tachycardia, possibly due an supraventricular tachycardia. Her magnesium is noted to be very low. I am going to give 2 grams of magnesium IV x 1, check a stat TSH, serial cardiac enzymes. I will start her on low-dose metoprolol, will get an echocardiogram of the  heart. If heart rate issue continues to occur, then a cardiology consult will be needed. If tachycardia persists, we may have to get a VQ scan of her lungs.  2.  Acute renal failure. We will give IV fluids, monitor renal function.  3.  Hypomagnesemia. I will replace her magnesium, recheck magnesium in the a.m.  4.  Nicotine addiction. Recommend the patient stop smoking. Nicotine patch offered, 4 minutes spent.   TIME SPENT: Fifty minutes.   Case discussed with Dr. Bary Castilla and the family.    ____________________________ Lafonda Mosses Posey Pronto, MD shp:TT D: 05/09/2014 14:34:45 ET T: 05/09/2014 14:47:17 ET JOB#: [id number]  cc: Chandan Fly H. Posey Pronto, MD, <Dictator> Alric Seton MD ELECTRONICALLY SIGNED 05/21/2014 8:39

## 2014-12-25 NOTE — Consult Note (Signed)
PATIENT NAME:  [name], [name] MR#:  [id number] DATE OF BIRTH:  07-May-1946  CARDIAC CONSULTATION   DATE OF CONSULTATION:  05/10/2014  REFERRING PHYSICIAN:  Shreyang H. Posey Pronto, MD CONSULTING PHYSICIAN:  Dwayne D. Clayborn Bigness, MD  PRIMARY PHYSICIAN: Juline Patch, MD  ATTENDING PHYSICIAN: [name], MD  INDICATION: Palpitations, tachycardia, postoperative.   HISTORY OF PRESENT ILLNESS: The patient is a 69 year old white female admitted by Dr. Bary Castilla for ventral hernia repair, gallstones, underwent cholecystectomy and bilateral myocutaneous flap advancement and mesh repair of ventral hernia. The patient tolerated procedure well. Has had some mild abdominal discomfort. While sitting up in the bathroom, started feeling weak and dizzy. She got back in bed and felt her heart started beating fast. Episode lasted about 5 minutes. Had a high rate. She was not on telemetry. She was slightly short of breath. Once the episode passed, she began to feel better. Did not have much in the way of chest pain. She just felt her heart beating fast and tachycardic with skipping and palpitations. Denies any nausea. Still has some mild abdominal pain. Has been on oxygen and now feels better.   PAST MEDICAL HISTORY:  1. Endometrial cancer.  2. Kidney stones.  3. Ventral hernia.   PAST SURGICAL HISTORY:  1. Hysterectomy.  2. Mole removal.  3. Lithotripsy.  4. Colostomy for colovaginal fistula.  5. Port-A-Cath with removal. 6. Appendectomy. 7. D and C.  8. BTL.   MEDICATIONS: She is on:  1. Morphine p.r.n. PCA pump. 2. Promethazine p.r.n. 3. Tylenol. 4. Lovenox for deep vein thrombosis prophylaxis.   SOCIAL HISTORY: Smoker, using vapor cigarettes. No alcohol consumption.   FAMILY HISTORY: Hypertension.   REVIEW OF SYSTEMS: No blackout spells or syncope. No nausea or vomiting. Denies fever, chills, sweats. No weight loss, no weight gain. No hemoptysis, hematemesis, bright red blood per rectum.  She has had mild abdominal pain from a ventral hernia repair. Recent palpitations, tachycardia. No significant chest pain. Denies shortness of breath.   PHYSICAL EXAMINATION:  VITAL SIGNS: Blood pressure was 130/70, pulse of 80, respiratory rate was 16, afebrile.  HEENT: Normocephalic, atraumatic. Pupils equally reactive to light.  NECK: Supple. No significant JVD, bruits or adenopathy.  LUNGS: Clear to auscultation and percussion. No significant wheeze, rhonchi or rale.  HEART: Regular rate and rhythm. Systolic ejection murmur at the apex.  ABDOMEN: Benign. Soreness postoperatively, otherwise negative, with bandages in place.  NEUROLOGIC: Intact.  SKIN: Normal.   LABORATORIES: Glucose 117, BUN 25, creatinine 1.74, sodium 133, potassium 4.1, chloride of 101, CO2 25, magnesium 1.1. White count 14.7, hemoglobin 12.2, platelet count of 181.  EKG: Sinus tachycardia, nonspecific ST-T changes.   ASSESSMENT:  1. Tachycardia, palpitations.  2. Acute renal failure.  3. Hypomagnesemia.  4. Nicotine addiction with smoking.  5. Ventral hernia repair.  6. Mild obesity.  7. History of gallbladder and kidney stone in the past.   PLAN: Agree with cardiology evaluation. Consider telemetry for 24 hours. Correct electrolytes. Continue pain management. Will consider beta blockers postoperatively. Follow up TSH management. Echocardiogram would be useful. Follow up renal insufficiency with fluid resuscitation. Correct hypomagnesemia. Continue vaporization for nicotine addiction. Will treat the patient conservatively. If the tachycardia does not recur, no further therapy is necessary. If she has further symptoms, beta blockers would be the first line therapy. Continue deep vein thrombosis prophylaxis.    ____________________________ Loran Senters Clayborn Bigness, MD ddc:lb D: 05/11/2014 09:52:00 ET T: 05/11/2014 10:11:17 ET JOB#: [id number]  cc: Dwayne D.  Clayborn Bigness, MD, <Dictator> Yolonda Kida MD ELECTRONICALLY  SIGNED 06/01/2014 14:53

## 2014-12-25 NOTE — Discharge Summary (Signed)
PATIENT NAME:  [name], [name] MR#:  [id number] DATE OF BIRTH:  1946-06-12  DATE OF ADMISSION:  05/07/2014 DATE OF DISCHARGE:  05/14/2014  DISCHARGE DIAGNOSIS: Multiple ventral hernias.   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 She underwent elective resection, but developed an anastomotic leak 10 days later resulting in an end stoma. She has developed multiple midline as well as parastomal hernias. She was brought to the operating room for planned ventral hernia repair. She had declined colostomy takedown.   The patient was admitted to the hospital the morning of surgery having undergone exploratory celiotomy, lysis of adhesions, bilateral myocutaneous advancement flaps, cholecystectomy and placement of a 25 x 33 cm retrorectus like polypropylene mesh. Her postoperative course was unremarkable. She ambulated early and often. She required O2 supplementation for a couple of days, but this responded to the use of incentive spirometry therapy. She showed no complications of alcohol withdrawal. Her diet was advanced, but she developed acute gastric distention requiring placement of a nasogastric tube. This was in place for approximately 48 hours. Once it was removed, she had no further difficulties in this regard. Spontaneous colon function was slow and responded to the use of a Dulcolax suppository per the stoma. At the time of discharge, she was ambulating well, was well-versed in management of her drains and felt to be a candidate for outpatient care.   At the time of discharge, home going instructions were provided. She was to make use of Vicodin if needed for pain, metoclopramide 5 mg q.i.d., metoprolol 25 mg b.i.d. (she had a short episode of SVT during her hospitalization), magnesium oxide 400 mg b.i.d., and Protonix 40 mg daily.   Arrangements were made for  outpatient follow-up in my office.   Pathology review of the gallbladder showed chronic cholecystitis and cholelithiasis.   ____________________________ [name], MD jwb:sb D: 06/01/2014 09:13:29 ET T: 06/01/2014 09:33:42 ET JOB#: [id number]  cc: [name], MD, <Dictator> Juline Patch, MD Joana Nolton Amedeo Kinsman MD ELECTRONICALLY SIGNED 06/02/2014 21:01

## 2015-01-25 ENCOUNTER — Other Ambulatory Visit: Payer: Self-pay

## 2015-01-25 DIAGNOSIS — Z8542 Personal history of malignant neoplasm of other parts of uterus: Secondary | ICD-10-CM

## 2015-01-26 ENCOUNTER — Encounter: Payer: Self-pay | Admitting: Hematology and Oncology

## 2015-01-26 ENCOUNTER — Inpatient Hospital Stay: Payer: PRIVATE HEALTH INSURANCE | Attending: Hematology and Oncology

## 2015-01-26 ENCOUNTER — Inpatient Hospital Stay (HOSPITAL_BASED_OUTPATIENT_CLINIC_OR_DEPARTMENT_OTHER): Payer: PRIVATE HEALTH INSURANCE | Admitting: Hematology and Oncology

## 2015-01-26 VITALS — BP 138/73 | HR 111 | Temp 98.9°F | Ht 61.0 in | Wt 164.2 lb

## 2015-01-26 DIAGNOSIS — Z9221 Personal history of antineoplastic chemotherapy: Secondary | ICD-10-CM | POA: Insufficient documentation

## 2015-01-26 DIAGNOSIS — F17210 Nicotine dependence, cigarettes, uncomplicated: Secondary | ICD-10-CM | POA: Diagnosis not present

## 2015-01-26 DIAGNOSIS — C541 Malignant neoplasm of endometrium: Secondary | ICD-10-CM | POA: Diagnosis not present

## 2015-01-26 DIAGNOSIS — G629 Polyneuropathy, unspecified: Secondary | ICD-10-CM

## 2015-01-26 DIAGNOSIS — Z87442 Personal history of urinary calculi: Secondary | ICD-10-CM | POA: Insufficient documentation

## 2015-01-26 DIAGNOSIS — Z8542 Personal history of malignant neoplasm of other parts of uterus: Secondary | ICD-10-CM

## 2015-01-26 DIAGNOSIS — K432 Incisional hernia without obstruction or gangrene: Secondary | ICD-10-CM | POA: Insufficient documentation

## 2015-01-26 DIAGNOSIS — E669 Obesity, unspecified: Secondary | ICD-10-CM | POA: Diagnosis not present

## 2015-01-26 DIAGNOSIS — Z923 Personal history of irradiation: Secondary | ICD-10-CM

## 2015-01-26 DIAGNOSIS — Z933 Colostomy status: Secondary | ICD-10-CM | POA: Diagnosis not present

## 2015-01-26 DIAGNOSIS — Z90710 Acquired absence of both cervix and uterus: Secondary | ICD-10-CM | POA: Diagnosis not present

## 2015-01-26 LAB — COMPREHENSIVE METABOLIC PANEL
ALT: 14 U/L (ref 14–54)
AST: 17 U/L (ref 15–41)
Albumin: 4 g/dL (ref 3.5–5.0)
Alkaline Phosphatase: 102 U/L (ref 38–126)
Anion gap: 15 (ref 5–15)
BUN: 29 mg/dL — ABNORMAL HIGH (ref 6–20)
CO2: 20 mmol/L — ABNORMAL LOW (ref 22–32)
Calcium: 9.4 mg/dL (ref 8.9–10.3)
Chloride: 103 mmol/L (ref 101–111)
Creatinine, Ser: 1.27 mg/dL — ABNORMAL HIGH (ref 0.44–1.00)
GFR calc Af Amer: 49 mL/min — ABNORMAL LOW (ref >60)
GFR calc non Af Amer: 43 mL/min — ABNORMAL LOW (ref >60)
Glucose, Bld: 107 mg/dL — ABNORMAL HIGH (ref 65–99)
Potassium: 3.8 mmol/L (ref 3.5–5.1)
Sodium: 138 mmol/L (ref 135–145)
Total Bilirubin: 0.6 mg/dL (ref 0.3–1.2)
Total Protein: 7.6 g/dL (ref 6.5–8.1)

## 2015-01-26 LAB — CBC WITH DIFFERENTIAL/PLATELET
BASOS ABS: 0.1 10*3/uL (ref 0–0.1)
BASOS PCT: 1 %
Eosinophils Absolute: 0.1 10*3/uL (ref 0–0.7)
Eosinophils Relative: 1 %
HCT: 43.9 % (ref 35.0–47.0)
HEMOGLOBIN: 14.8 g/dL (ref 12.0–16.0)
LYMPHS PCT: 20 %
Lymphs Abs: 2 10*3/uL (ref 1.0–3.6)
MCH: 34.6 pg — ABNORMAL HIGH (ref 26.0–34.0)
MCHC: 33.8 g/dL (ref 32.0–36.0)
MCV: 102.3 fL — AB (ref 80.0–100.0)
MONO ABS: 1.2 10*3/uL — AB (ref 0.2–0.9)
Monocytes Relative: 11 %
NEUTROS ABS: 6.7 10*3/uL — AB (ref 1.4–6.5)
Neutrophils Relative %: 67 %
PLATELETS: 269 10*3/uL (ref 150–440)
RBC: 4.29 MIL/uL (ref 3.80–5.20)
RDW: 14.1 % (ref 11.5–14.5)
WBC: 10.1 10*3/uL (ref 3.6–11.0)

## 2015-01-26 NOTE — Progress Notes (Signed)
Pt here today for follow up regarding uterine cancer; offers no complaints today

## 2015-01-27 LAB — CA 125: CA 125: 40.5 U/mL — ABNORMAL HIGH (ref 0.0–38.1)

## 2015-02-10 ENCOUNTER — Other Ambulatory Visit: Payer: Self-pay | Admitting: Hematology and Oncology

## 2015-02-10 DIAGNOSIS — C541 Malignant neoplasm of endometrium: Secondary | ICD-10-CM

## 2015-02-24 ENCOUNTER — Other Ambulatory Visit: Payer: Medicare Other

## 2015-07-11 ENCOUNTER — Other Ambulatory Visit: Payer: Self-pay

## 2015-07-27 ENCOUNTER — Ambulatory Visit: Payer: Medicare Other | Admitting: Hematology and Oncology

## 2015-07-27 ENCOUNTER — Other Ambulatory Visit: Payer: Medicare Other

## 2015-08-17 ENCOUNTER — Other Ambulatory Visit: Payer: Medicare Other

## 2015-08-17 ENCOUNTER — Encounter: Payer: Self-pay | Admitting: Hematology and Oncology

## 2015-08-17 ENCOUNTER — Ambulatory Visit: Payer: Medicare Other | Admitting: Hematology and Oncology

## 2015-08-17 NOTE — Progress Notes (Signed)
[facility] day:  01/26/2015  Chief Complaint: [name] is a 69 y.o. female with a history of stage III endometrial cancer who is seen for reassessment.  HPI:  The patient had a short episode of postmenopausal bleeding. She was diagnosed with adenocarcinoma of the endometrium on D&C.  Procedure was complicated by uterine perforation. She underwent total abdominal hysterectomy and bilateral salpingo-oophorectomy with full staging biopsies in 02/2010. Pathology revealed grade III endometrial carcinoma.  Tumor extending through the thickness of the myometrium. Tumor was 3 x 3 cm. There were 2 of 6 right pelvic lymph nodes and 2 of 6 periaortic nodes involved with metastatic disease. Peritoneal washings were negative.  Pathologic stage was T3N2M0 (stage III c2).  She received 6 cycles of carboplatin and Taxol from 03/27/2010 until 08/23/2010.  She noted a grade I/II neuropathy.  During chemotherapy, in 05/2010 she developed a colovaginal fistula and underwent colostomy. She had a re-anastomosis although developed a leak and underwent diverting colostomy.  He has had 3 hernias.  Her last complication was on Labor Day 2014.  Post chemotherapy she underwent brachytherapy (completed 11/2010).  She was last seen in medical oncology clinic on 07/23/2014 by Dr. Oliva Bustard.  She was noted to have had multiple hospital admissions due to adhesions and small bowel obstructions. She was gradually recovering. She denied any abdominal pain.  Labs included a hematocrit of 47, hemoglobin 15.5, MCV 107, platelets 225,000, white count 9900 with an ANC of 6700.  During the interim, she has done well. She voices no complaints. She is unaware of her last mammogram.  She is smoking.  Past Medical History  Diagnosis Date  . Diverticulitis of colon (without mention of hemorrhage)   . Mechanical complication of colostomy and enterostomy   . Cancer 2011    endometrial  .  Personal history of tobacco use, presenting hazards to health   . History of kidney stones 2011  . Obesity, unspecified     Past Surgical History  Procedure Laterality Date  . Appendectomy    . Lithotripsy  2009  . Tubal ligation    . Pyelolithotomy    . Colostomy  2011  . Abdominal hysterectomy  2011  . Dilation and curettage of uterus    . Mole removal  2003  . Hysteroscopy  2011  . Colonoscopy  June 2010,03/31/14    Tubular adenoma of the transverse colon, hyperplastic polyps of the sigmoid.  . 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 Colon surgery  05/20/1999    Drainage of pelvic abscess, end colostomy  . Colon surgery  11/15/2010    Revision of colostomy stoma  . Skin cancer removal  02/2014  . Hernia repair  04/06/14    ventral hernia  . Cholecystectomy  05/07/14    Family History  Problem Relation Age of Onset  . Breast cancer Maternal Grandmother   . Colon cancer Mother   . Cancer Mother     Social History:  reports that she has been smoking Cigarettes.  She has smoked for the past 15 years. She has never used smokeless tobacco. She reports that she drinks alcohol. She reports that she does not use illicit drugs.  She smokes 7 cigarettes a day plus vapor cigarettes.  She doesn't want to quit smoking.  The patient is alone today.  Allergies: No Known Allergies  Current Medications: No  current outpatient prescriptions on file.   No current facility-administered medications for this visit.    Review of Systems:  GENERAL:  Feels good.  Active.  No fevers, sweats or weight loss. PERFORMANCE STATUS (ECOG):  1 HEENT:  No visual changes, runny nose, sore throat, mouth sores or tenderness. Lungs: No shortness of breath or cough.  No hemoptysis. Cardiac:  No chest pain, palpitations, orthopnea, or PND. GI:  No nausea, vomiting, diarrhea, constipation, melena or hematochezia. GU:  No urgency,  frequency, dysuria, or hematuria. Musculoskeletal:  No back pain.  No joint pain.  No muscle tenderness. Extremities:  No pain or swelling. Skin:  No rashes or skin changes. Neuro:  No headache, numbness or weakness, balance or coordination issues. Endocrine:  No diabetes, thyroid issues, hot flashes or night sweats. Psych:  No mood changes, depression or anxiety. Pain:  No focal pain. Review of systems:  All other systems reviewed and found to be negative.  Physical Exam: Blood pressure 138/73, pulse 111, temperature 98.9 F (37.2 C), temperature source Tympanic, height _0  (1.549 m), weight 164 lb 3.9 oz (74.5 kg). GENERAL:  Well developed, well nourished, sitting comfortably in the exam room in no acute distress. MENTAL STATUS:  Alert and oriented to person, place and time. HEAD:  Short gray hair.  Normocephalic, atraumatic, face symmetric, no Cushingoid features. EYES:  Blue eyes.  Pupils equal round and reactive to light and accomodation.  No conjunctivitis or scleral icterus. ENT:  Hoarse.  Oropharynx clear without lesion.  Tongue normal. Mucous membranes moist.  RESPIRATORY:  Clear to auscultation without rales, wheezes or rhonchi. CARDIOVASCULAR:  Regular rate and rhythm without murmur, rub or gallop. ABDOMEN:  Colostomy.  Soft, non-tender, with active bowel sounds, and no hepatosplenomegaly.  No masses. SKIN:  No rashes, ulcers or lesions. EXTREMITIES: No edema, no skin discoloration or tenderness.  No palpable cords. LYMPH NODES: No palpable cervical, supraclavicular, axillary or inguinal adenopathy  NEUROLOGICAL: Unremarkable. PSYCH:  Appropriate.  Clinical Support on 01/26/2015  Component Date Value Ref Range Status  . CA 125 01/26/2015 40.5* 0.0 - 38.1 U/mL Final   Comment: (NOTE) .                            **Please note reference interval change** [employer] ECLIA methodology Performed At: [facility] [place], [place] [id number] Lindon Romp  MD [id number]   . Sodium 01/26/2015 138  135 - 145 mmol/L Final  . Potassium 01/26/2015 3.8  3.5 - 5.1 mmol/L Final  . Chloride 01/26/2015 103  101 - 111 mmol/L Final  . CO2 01/26/2015 20* 22 - 32 mmol/L Final  . Glucose, Bld 01/26/2015 107* 65 - 99 mg/dL Final  . BUN 01/26/2015 29* 6 - 20 mg/dL Final  . Creatinine, Ser 01/26/2015 1.27* 0.44 - 1.00 mg/dL Final  . Calcium 01/26/2015 9.4  8.9 - 10.3 mg/dL Final  . Total Protein 01/26/2015 7.6  6.5 - 8.1 g/dL Final  . Albumin 01/26/2015 4.0  3.5 - 5.0 g/dL Final  . AST 01/26/2015 17  15 - 41 U/L Final  . ALT 01/26/2015 14  14 - 54 U/L Final  . Alkaline Phosphatase 01/26/2015 102  38 - 126 U/L Final  . Total Bilirubin 01/26/2015 0.6  0.3 - 1.2 mg/dL Final  . GFR calc non Af Amer 01/26/2015 43* >60 mL/min Final  . GFR calc Af Amer 01/26/2015 49* >60 mL/min Final  Comment: (NOTE) The eGFR has been calculated using the CKD EPI equation. This calculation has not been validated in all clinical situations. eGFR's persistently <60 mL/min signify possible Chronic Kidney Disease.   . Anion gap 01/26/2015 15  5 - 15 Final  . WBC 01/26/2015 10.1  3.6 - 11.0 K/uL Final  . RBC 01/26/2015 4.29  3.80 - 5.20 MIL/uL Final  . Hemoglobin 01/26/2015 14.8  12.0 - 16.0 g/dL Final  . HCT 01/26/2015 43.9  35.0 - 47.0 % Final  . MCV 01/26/2015 102.3* 80.0 - 100.0 fL Final  . MCH 01/26/2015 34.6* 26.0 - 34.0 pg Final  . MCHC 01/26/2015 33.8  32.0 - 36.0 g/dL Final  . RDW 01/26/2015 14.1  11.5 - 14.5 % Final  . Platelets 01/26/2015 269  150 - 440 K/uL Final  . Neutrophils Relative % 01/26/2015 67   Final  . Neutro Abs 01/26/2015 6.7* 1.4 - 6.5 K/uL Final  . Lymphocytes Relative 01/26/2015 20   Final  . Lymphs Abs 01/26/2015 2.0  1.0 - 3.6 K/uL Final  . Monocytes Relative 01/26/2015 11   Final  . Monocytes Absolute 01/26/2015 1.2* 0.2 - 0.9 K/uL Final  . Eosinophils Relative 01/26/2015 1   Final  . Eosinophils Absolute 01/26/2015 0.1  0 - 0.7 K/uL Final   . Basophils Relative 01/26/2015 1   Final  . Basophils Absolute 01/26/2015 0.1  0 - 0.1 K/uL Final    Assessment:  VYOLET SAKUMA is a 69 y.o. female with a history of stage III endometrial carcinoma stats post TAH/BSO in 02/2010.  Pathology revealed grade III endometrial carcinoma.  Tumor was 3 x 3 cm and extended through the thickness of the myometrium.  There were 2 of 6 right pelvic lymph nodes and 2 of 6 periaortic nodes involved with metastatic disease. Peritoneal washings were negative.  Pathologic stage was T3N2M0 (stage III c2).  She received 6 cycles of carboplatin and Taxol from 03/27/2010 until 08/23/2010.  She received brachytherapy (completed 11/2010).  She developed a grade I/II neuropathy.  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 Following re-anastomosis, she developed a leak and underwent diverting colostomy.  She has had 3 incisional hernias.   Symptomatically, she fells well.  Exam is unremarkable.  Plan: 1. Review entire medical history, diagnosis and management of endometrial cancer. 2. Labs today:  CBC with diff, CMP, CA125. 3. Follow-up with [facility]YN[facility] for pelvic exam. 4. Encourage yearly mammogram. 5. Discuss smoking cessation. 6. RTC in 6 months for MD assessment and labs (CBC with diff, CMP, CA125)   Lequita Asal, MD  01/26/2015

## 2015-11-25 ENCOUNTER — Ambulatory Visit (INDEPENDENT_AMBULATORY_CARE_PROVIDER_SITE_OTHER): Payer: Medicare Other | Admitting: Family Medicine

## 2015-11-25 ENCOUNTER — Encounter: Payer: Self-pay | Admitting: Family Medicine

## 2015-11-25 VITALS — BP 120/80 | HR 70 | Ht 61.0 in | Wt 175.0 lb

## 2015-11-25 DIAGNOSIS — F329 Major depressive disorder, single episode, unspecified: Secondary | ICD-10-CM | POA: Diagnosis not present

## 2015-11-25 DIAGNOSIS — F32A Depression, unspecified: Secondary | ICD-10-CM

## 2015-11-25 DIAGNOSIS — F419 Anxiety disorder, unspecified: Secondary | ICD-10-CM

## 2015-11-25 MED ORDER — ALPRAZOLAM 0.25 MG PO TABS: 0.2500 mg | ORAL_TABLET | Freq: Two times a day (BID) | ORAL | Status: DC | PRN

## 2015-11-25 MED ORDER — SERTRALINE HCL 50 MG PO TABS: 50.0000 mg | ORAL_TABLET | Freq: Every day | ORAL | Status: DC

## 2015-11-25 NOTE — Progress Notes (Signed)
Name: [name]   MRN: [id number]    DOB: [birth year]   Date:11/25/2015       Progress Note  Subjective  Chief Complaint  Chief Complaint  Patient presents with  . Depression    Depression        This is a new problem.  The current episode started more than 1 month ago.   The onset quality is gradual.   The problem occurs daily.  The problem has been gradually worsening since onset.  Associated symptoms include decreased concentration, fatigue, helplessness, hopelessness, insomnia, irritable, restlessness, decreased interest and sad.  Associated symptoms include no appetite change, no myalgias, no headaches and no suicidal ideas.     The symptoms are aggravated by family issues.  Past treatments include nothing.  Compliance with treatment is good.  Past medical history includes anxiety.     Pertinent negatives include no chronic fatigue syndrome, no terminal illness and no eating disorder. Anxiety Presents for initial visit. Onset was 1 to 4 weeks ago. The problem has been gradually worsening. Symptoms include decreased concentration, depressed mood, insomnia, irritability, nervous/anxious behavior and restlessness. Patient reports no chest pain, dizziness, excessive worry, nausea, palpitations, shortness of breath or suicidal ideas. The symptoms are aggravated by family issues.      No problem-specific assessment & plan notes found for this encounter.   Past Medical History  Diagnosis Date  . Diverticulitis of colon (without mention of hemorrhage)   . Mechanical complication of colostomy and enterostomy   . 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 Personal history of tobacco use, presenting hazards to health   . History of kidney stones 2011  . Obesity, unspecified     Past Surgical History  Procedure Laterality Date  . Appendectomy    . Lithotripsy  2009  . Tubal ligation    . Pyelolithotomy    . Colostomy  2011  . Abdominal hysterectomy  2011  . Dilation and curettage of  uterus    . Mole removal  2003  . Hysteroscopy  2011  . Colonoscopy  June 2010,03/31/14    Tubular adenoma of the transverse colon, hyperplastic polyps of the sigmoid.  . 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 Colon surgery  05/20/1999    Drainage of pelvic abscess, end colostomy  . Colon surgery  11/15/2010    Revision of colostomy stoma  . Skin cancer removal  02/2014  . Hernia repair  04/06/14    ventral hernia  . Cholecystectomy  05/07/14    Family History  Problem Relation Age of Onset  . Breast cancer Maternal Grandmother   . Colon cancer Mother   . Cancer Mother     Social History   Social History  . Marital Status: Married    Spouse Name: N/A  . Number of Children: N/A  . Years of Education: N/A   Occupational History  . Not on file.   Social History Main Topics  . Smoking status: Current Every Day Smoker -- 15 years    Types: Cigarettes  . Smokeless tobacco: Never Used  . Alcohol Use: Yes  . Drug Use: No  . Sexual Activity: Not on file   Other Topics Concern  . Not on file   Social History Narrative    No Known Allergies   Review of Systems  Constitutional: Positive for irritability and fatigue. Negative for fever, chills, weight  loss, malaise/fatigue and appetite change.  HENT: Negative for ear discharge, ear pain and sore throat.   Eyes: Negative for blurred vision.  Respiratory: Negative for cough, sputum production, shortness of breath and wheezing.   Cardiovascular: Negative for chest pain, palpitations and leg swelling.  Gastrointestinal: Negative for heartburn, nausea, abdominal pain, diarrhea, constipation, blood in stool and melena.  Genitourinary: Negative for dysuria, urgency, frequency and hematuria.  Musculoskeletal: Negative for myalgias, back pain, joint pain and neck pain.  Skin: Negative for rash.  Neurological: Negative for dizziness, tingling, sensory  change, focal weakness and headaches.  Endo/Heme/Allergies: Negative for environmental allergies and polydipsia. Does not bruise/bleed easily.  Psychiatric/Behavioral: Positive for depression and decreased concentration. Negative for suicidal ideas. The patient is nervous/anxious and has insomnia.      Objective  Filed Vitals:   11/25/15 1025  BP: 120/80  Pulse: 70  Height: 5\' 1"  (1.549 m)  Weight: 175 lb (79.379 kg)    Physical Exam  Constitutional: She is well-developed, well-nourished, and in no distress. She is irritable. No distress.  HENT:  Head: Normocephalic and atraumatic.  Right Ear: External ear normal.  Left Ear: External ear normal.  Nose: Nose normal.  Mouth/Throat: Oropharynx is clear and moist.  Eyes: Conjunctivae and EOM are normal. Pupils are equal, round, and reactive to light. Right eye exhibits no discharge. Left eye exhibits no discharge.  Neck: Normal range of motion. Neck supple. No JVD present. No thyromegaly present.  Cardiovascular: Normal rate, regular rhythm, normal heart sounds and intact distal pulses.  Exam reveals no gallop and no friction rub.   No murmur heard. Pulmonary/Chest: Effort normal and breath sounds normal.  Abdominal: Soft. Bowel sounds are normal. She exhibits no mass. There is no tenderness. There is no guarding.  Musculoskeletal: Normal range of motion. She exhibits no edema.  Lymphadenopathy:    She has no cervical adenopathy.  Neurological: She is alert. She has normal reflexes.  Skin: Skin is warm and dry. She is not diaphoretic.  Psychiatric: Mood and affect normal.  Nursing note and vitals reviewed.     Assessment & Plan  Problem List Items Addressed This Visit    None    Visit Diagnoses    Depression    -  Primary    Relevant Medications    sertraline (ZOLOFT) 50 MG tablet    ALPRAZolam (XANAX) 0.25 MG tablet    Acute anxiety        Relevant Medications    sertraline (ZOLOFT) 50 MG tablet    ALPRAZolam  (XANAX) 0.25 MG tablet         Dr. Bricelyn Freestone [facility]  11/25/2015

## 2015-12-12 ENCOUNTER — Other Ambulatory Visit: Payer: Self-pay

## 2015-12-13 ENCOUNTER — Ambulatory Visit (INDEPENDENT_AMBULATORY_CARE_PROVIDER_SITE_OTHER): Payer: Medicare Other | Admitting: Family Medicine

## 2015-12-13 ENCOUNTER — Encounter: Payer: Self-pay | Admitting: Family Medicine

## 2015-12-13 VITALS — BP 140/70 | HR 86 | Ht 61.0 in | Wt 172.0 lb

## 2015-12-13 DIAGNOSIS — F430 Acute stress reaction: Secondary | ICD-10-CM | POA: Diagnosis not present

## 2015-12-13 DIAGNOSIS — F329 Major depressive disorder, single episode, unspecified: Secondary | ICD-10-CM | POA: Diagnosis not present

## 2015-12-13 DIAGNOSIS — F32A Depression, unspecified: Secondary | ICD-10-CM

## 2015-12-13 DIAGNOSIS — F419 Anxiety disorder, unspecified: Secondary | ICD-10-CM | POA: Diagnosis not present

## 2015-12-13 DIAGNOSIS — F4320 Adjustment disorder, unspecified: Secondary | ICD-10-CM

## 2015-12-13 MED ORDER — SERTRALINE HCL 50 MG PO TABS: 50.0000 mg | ORAL_TABLET | Freq: Every day | ORAL | Status: DC

## 2015-12-13 MED ORDER — ALPRAZOLAM 0.25 MG PO TABS: 0.2500 mg | ORAL_TABLET | ORAL | Status: DC

## 2015-12-13 NOTE — Progress Notes (Signed)
Name: [name]   MRN: [id number]    DOB: 03-19-1946   Date:12/13/2015       Progress Note  Subjective  Chief Complaint  Chief Complaint  Patient presents with  . Anxiety    having a hard time dealing with husband and daughter "ugly situation"    Anxiety Presents for follow-up visit. Onset was 1 to 6 months ago. The problem has been waxing and waning. Symptoms include excessive worry and nervous/anxious behavior. Patient reports no chest pain, compulsions, confusion, decreased concentration, depressed mood, dizziness, dry mouth, feeling of choking, hyperventilation, impotence, insomnia, irritability, malaise, muscle tension, nausea, obsessions, palpitations, panic, restlessness, shortness of breath or suicidal ideas. The severity of symptoms is mild. The quality of sleep is fair.   Past treatments include benzodiazephines.    No problem-specific assessment & plan notes found for this encounter.   Past Medical History  Diagnosis Date  . Diverticulitis of colon (without mention of hemorrhage)   . Mechanical complication of colostomy and enterostomy   . Cancer [facility]) 2011    endometrial  . Personal history of tobacco use, presenting hazards to health   . History of kidney stones 2011  . Obesity, unspecified     Past Surgical History  Procedure Laterality Date  . Appendectomy    . Lithotripsy  2009  . Tubal ligation    . Pyelolithotomy    . Colostomy  2011  . Abdominal hysterectomy  2011  . Dilation and curettage of uterus    . Mole removal  2003  . Hysteroscopy  2011  . Colonoscopy  June 2010,03/31/14    Tubular adenoma of the transverse colon, hyperplastic polyps of the sigmoid.  . 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 Colon surgery  05/20/1999    Drainage of pelvic abscess, end colostomy  . Colon surgery  11/15/2010    Revision of colostomy stoma  . Skin cancer removal  02/2014   . Hernia repair  04/06/14    ventral hernia  . Cholecystectomy  05/07/14    Family History  Problem Relation Age of Onset  . Breast cancer Maternal Grandmother   . Colon cancer Mother   . Cancer Mother     Social History   Social History  . Marital Status: Married    Spouse Name: N/A  . Number of Children: N/A  . Years of Education: N/A   Occupational History  . Not on file.   Social History Main Topics  . Smoking status: Current Every Day Smoker -- 15 years    Types: Cigarettes  . Smokeless tobacco: Never Used  . Alcohol Use: Yes  . Drug Use: No  . Sexual Activity: Not on file   Other Topics Concern  . Not on file   Social History Narrative    No Known Allergies   Review of Systems  Constitutional: Negative for irritability.  Respiratory: Negative for shortness of breath.   Cardiovascular: Negative for chest pain and palpitations.  Gastrointestinal: Negative for nausea.  Genitourinary: Negative for impotence.  Neurological: Negative for dizziness.  Psychiatric/Behavioral: Negative for suicidal ideas, confusion and decreased concentration. The patient is nervous/anxious. The patient does not have insomnia.      Objective  Filed Vitals:   12/13/15 0822  BP: 140/70  Pulse: 86  Height: 5\' 1"  (1.549 m)  Weight: 172 lb (78.019 kg)    Physical Exam  Constitutional: She is  well-developed, well-nourished, and in no distress. No distress.  HENT:  Head: Normocephalic and atraumatic.  Right Ear: External ear normal.  Left Ear: External ear normal.  Nose: Nose normal.  Mouth/Throat: Oropharynx is clear and moist.  Eyes: Conjunctivae and EOM are normal. Pupils are equal, round, and reactive to light. Right eye exhibits no discharge. Left eye exhibits no discharge.  Neck: Normal range of motion. Neck supple. No JVD present. No thyromegaly present.  Cardiovascular: Normal rate, regular rhythm, normal heart sounds and intact distal pulses.  Exam reveals no gallop  and no friction rub.   No murmur heard. Pulmonary/Chest: Effort normal and breath sounds normal.  Abdominal: Soft. Bowel sounds are normal. She exhibits no mass. There is no tenderness. There is no guarding.  Musculoskeletal: Normal range of motion. She exhibits no edema.  Lymphadenopathy:    She has no cervical adenopathy.  Neurological: She is alert. She has normal reflexes.  Skin: Skin is warm and dry. She is not diaphoretic.  Psychiatric: Mood and affect normal.  Nursing note and vitals reviewed.     Assessment & Plan  Problem List Items Addressed This Visit    None    Visit Diagnoses    Acute anxiety    -  Primary    patient refuses psychtherapy at this time/ patient cautioned about use of alcohol and benzodiazepine/    Relevant Medications    ALPRAZolam (XANAX) 0.25 MG tablet    sertraline (ZOLOFT) 50 MG tablet    Adult situational stress disorder        Depression        Relevant Medications    ALPRAZolam (XANAX) 0.25 MG tablet    sertraline (ZOLOFT) 50 MG tablet    Acute anxiety        Relevant Medications    ALPRAZolam (XANAX) 0.25 MG tablet    sertraline (ZOLOFT) 50 MG tablet         Dr. Deanna Jones [facility]  12/13/2015

## 2015-12-13 NOTE — Patient Instructions (Signed)
Generalized Anxiety Disorder Generalized anxiety disorder (GAD) is a mental disorder. It interferes with life functions, including relationships, work, and school. GAD is different from normal anxiety, which everyone experiences at some point in their lives in response to specific life events and activities. Normal anxiety actually helps us prepare for and get through these life events and activities. Normal anxiety goes away after the event or activity is over.  GAD causes anxiety that is not necessarily related to specific events or activities. It also causes excess anxiety in proportion to specific events or activities. The anxiety associated with GAD is also difficult to control. GAD can vary from mild to severe. People with severe GAD can have intense waves of anxiety with physical symptoms (panic attacks).  SYMPTOMS The anxiety and worry associated with GAD are difficult to control. This anxiety and worry are related to many life events and activities and also occur more days than not for 6 months or longer. People with GAD also have three or more of the following symptoms (one or more in children):  Restlessness.   Fatigue.  Difficulty concentrating.   Irritability.  Muscle tension.  Difficulty sleeping or unsatisfying sleep. DIAGNOSIS GAD is diagnosed through an assessment by your health care provider. Your health care provider will ask you questions aboutyour mood,physical symptoms, and events in your life. Your health care provider may ask you about your medical history and use of alcohol or drugs, including prescription medicines. Your health care provider may also do a physical exam and blood tests. Certain medical conditions and the use of certain substances can cause symptoms similar to those associated with GAD. Your health care provider may refer you to a mental health specialist for further evaluation. TREATMENT The following therapies are usually used to treat GAD:    Medication. Antidepressant medication usually is prescribed for long-term daily control. Antianxiety medicines may be added in severe cases, especially when panic attacks occur.   Talk therapy (psychotherapy). Certain types of talk therapy can be helpful in treating GAD by providing support, education, and guidance. A form of talk therapy called cognitive behavioral therapy can teach you healthy ways to think about and react to daily life events and activities.  Stress managementtechniques. These include yoga, meditation, and exercise and can be very helpful when they are practiced regularly. A mental health specialist can help determine which treatment is best for you. Some people see improvement with one therapy. However, other people require a combination of therapies.   This information is not intended to replace advice given to you by your health care provider. Make sure you discuss any questions you have with your health care provider.   Document Released: 12/15/2012 Document Revised: 09/10/2014 Document Reviewed: 12/15/2012 Elsevier Interactive Patient Education 2016 [employer] Inc[employer]  

## 2016-01-23 ENCOUNTER — Ambulatory Visit (INDEPENDENT_AMBULATORY_CARE_PROVIDER_SITE_OTHER): Payer: Medicare Other | Admitting: General Surgery

## 2016-01-23 ENCOUNTER — Encounter: Payer: Self-pay | Admitting: General Surgery

## 2016-01-23 VITALS — BP 130/74 | HR 70 | Resp 12 | Ht 61.0 in | Wt 163.0 lb

## 2016-01-23 DIAGNOSIS — R195 Other fecal abnormalities: Secondary | ICD-10-CM | POA: Diagnosis not present

## 2016-01-23 DIAGNOSIS — R1084 Generalized abdominal pain: Secondary | ICD-10-CM | POA: Diagnosis not present

## 2016-01-23 NOTE — Progress Notes (Signed)
Patient ID: [name], female   DOB: 1946/06/05, 70 y.o.   MRN: [id number]  Chief Complaint  Patient presents with  . Other    watery stool     HPI [name] is a 70 y.o. female here today for a evaluation of watery stools. Patient states this has been going on for about one week now. Stools have been more firm over the last 24 hours.  She reported "grippng" pain around the umbilicus at the beginning, but resolved within 2 days. She has been up at night changing her stoma bag. She denies any pain at present, no dietary intolerance or fever. No urinary symptoms. She has noted no change in the appearance of the stoma.  No bleeding noted. Recent home stresses with her daughter, [name].  I personally reviewed the patient's history.  HPI  Past Medical History  Diagnosis Date  . Diverticulitis of colon (without mention of hemorrhage)   . Mechanical complication of colostomy and enterostomy   . Cancer [facility]) 2011    endometrial  . Personal history of tobacco use, presenting hazards to health   . History of kidney stones 2011  . Obesity, unspecified     Past Surgical History  Procedure Laterality Date  . Appendectomy    . Lithotripsy  2009  . Tubal ligation    . Pyelolithotomy    . Colostomy  2011  . Abdominal hysterectomy  2011  . Dilation and curettage of uterus    . Mole removal  2003  . Hysteroscopy  2011  . Colonoscopy  June 2010,03/31/14    Tubular adenoma of the transverse colon, hyperplastic polyps of the sigmoid.  . 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 Colon surgery  05/20/1999    Drainage of pelvic abscess, end colostomy  . Colon surgery  11/15/2010    Revision of colostomy stoma  . Skin cancer removal  02/2014  . Hernia repair  04/06/14    ventral hernia  . Cholecystectomy  05/07/14    Family History  Problem Relation Age of Onset  . Breast cancer Maternal Grandmother    . Colon cancer Mother   . Cancer Mother     Social History Social History  Substance Use Topics  . Smoking status: Current Every Day Smoker -- 15 years    Types: Cigarettes  . Smokeless tobacco: Never Used  . Alcohol Use: Yes    No Known Allergies  Current Outpatient Prescriptions  Medication Sig Dispense Refill  . ALPRAZolam (XANAX) 0.25 MG tablet Take 1 tablet (0.25 mg total) by mouth 1 day or 1 dose. 15 tablet 1  . sertraline (ZOLOFT) 50 MG tablet Take 1 tablet (50 mg total) by mouth daily. 30 tablet 1   No current facility-administered medications for this visit.    Review of Systems Review of Systems  Constitutional: Negative.   Respiratory: Negative.   Cardiovascular: Negative.     Blood pressure 130/74, pulse 70, resp. rate 12, height 5' 1"  (1.549 m), weight 163 lb (73.936 kg).  Physical Exam Physical Exam  Constitutional: She is oriented to person, place, and time. She appears well-developed and well-nourished.  Eyes: Conjunctivae are normal. No scleral icterus.  Neck: Neck supple.  Cardiovascular: Normal rate, regular rhythm and normal heart sounds.   Pulmonary/Chest: Effort normal.  Abdominal: Soft. Normal appearance and bowel sounds are normal. There is no hepatomegaly. There is no tenderness. No  hernia.    Lymphadenopathy:    She has no cervical adenopathy.  Neurological: She is alert and oriented to person, place, and time.  Skin: Skin is warm and dry.    Data Reviewed PCP notes.   Assessment    Loose stools, negative exam.      Plan    Will check a CBC/ Met C prior to deciding on need for radiologic interrogation.  Patient encouraged to make use of a fiber supplement daily.       PCP:  Fraser Din 01/23/2016, 7:20 PM

## 2016-01-23 NOTE — Patient Instructions (Signed)
Patient to have labs drawn at [facility] today.  

## 2016-01-24 ENCOUNTER — Other Ambulatory Visit: Payer: Self-pay

## 2016-01-24 DIAGNOSIS — E860 Dehydration: Secondary | ICD-10-CM

## 2016-01-24 LAB — COMPREHENSIVE METABOLIC PANEL
A/G RATIO: 1.4 (ref 1.2–2.2)
ALT: 15 IU/L (ref 0–32)
AST: 15 IU/L (ref 0–40)
Albumin: 4 g/dL (ref 3.6–4.8)
Alkaline Phosphatase: 128 IU/L — ABNORMAL HIGH (ref 39–117)
BUN/Creatinine Ratio: 32 — ABNORMAL HIGH (ref 12–28)
BUN: 61 mg/dL — ABNORMAL HIGH (ref 8–27)
Bilirubin Total: 0.4 mg/dL (ref 0.0–1.2)
CALCIUM: 9.7 mg/dL (ref 8.7–10.3)
CO2: 17 mmol/L — AB (ref 18–29)
CREATININE: 1.91 mg/dL — AB (ref 0.57–1.00)
Chloride: 100 mmol/L (ref 96–106)
GFR, EST AFRICAN AMERICAN: 31 mL/min/{1.73_m2} — AB (ref >59)
GFR, EST NON AFRICAN AMERICAN: 27 mL/min/{1.73_m2} — AB (ref >59)
GLOBULIN, TOTAL: 2.9 g/dL (ref 1.5–4.5)
Glucose: 123 mg/dL — ABNORMAL HIGH (ref 65–99)
POTASSIUM: 4 mmol/L (ref 3.5–5.2)
SODIUM: 141 mmol/L (ref 134–144)
TOTAL PROTEIN: 6.9 g/dL (ref 6.0–8.5)

## 2016-01-24 LAB — CBC WITH DIFFERENTIAL/PLATELET
BASOS: 0 %
Basophils Absolute: 0 10*3/uL (ref 0.0–0.2)
EOS (ABSOLUTE): 0.1 10*3/uL (ref 0.0–0.4)
EOS: 1 %
HEMATOCRIT: 44 % (ref 34.0–46.6)
Hemoglobin: 14.8 g/dL (ref 11.1–15.9)
IMMATURE GRANS (ABS): 0 10*3/uL (ref 0.0–0.1)
IMMATURE GRANULOCYTES: 0 %
LYMPHS: 14 %
Lymphocytes Absolute: 1.5 10*3/uL (ref 0.7–3.1)
MCH: 33.3 pg — ABNORMAL HIGH (ref 26.6–33.0)
MCHC: 33.6 g/dL (ref 31.5–35.7)
MCV: 99 fL — ABNORMAL HIGH (ref 79–97)
MONOCYTES: 13 %
Monocytes Absolute: 1.4 10*3/uL — ABNORMAL HIGH (ref 0.1–0.9)
NEUTROS PCT: 72 %
Neutrophils Absolute: 7.6 10*3/uL — ABNORMAL HIGH (ref 1.4–7.0)
Platelets: 386 10*3/uL — ABNORMAL HIGH (ref 150–379)
RBC: 4.45 x10E6/uL (ref 3.77–5.28)
RDW: 13.5 % (ref 12.3–15.4)
WBC: 10.7 10*3/uL (ref 3.4–10.8)

## 2016-02-01 ENCOUNTER — Telehealth: Payer: Self-pay | Admitting: *Deleted

## 2016-02-01 ENCOUNTER — Other Ambulatory Visit
Admission: RE | Admit: 2016-02-01 | Discharge: 2016-02-01 | Disposition: A | Discharge status: Home / Self Care | Payer: Medicare Other | Source: Ambulatory Visit | Attending: General Surgery | Admitting: General Surgery

## 2016-02-01 ENCOUNTER — Other Ambulatory Visit: Payer: Self-pay | Admitting: *Deleted

## 2016-02-01 DIAGNOSIS — D72829 Elevated white blood cell count, unspecified: Secondary | ICD-10-CM

## 2016-02-01 DIAGNOSIS — R1084 Generalized abdominal pain: Secondary | ICD-10-CM

## 2016-02-01 DIAGNOSIS — R195 Other fecal abnormalities: Secondary | ICD-10-CM | POA: Insufficient documentation

## 2016-02-01 LAB — CBC WITH DIFFERENTIAL/PLATELET
BAND NEUTROPHILS: 0 %
BLASTS: 0 %
Basophils Absolute: 0.2 10*3/uL — ABNORMAL HIGH (ref 0–0.1)
Basophils Relative: 1 %
EOS ABS: 0.2 10*3/uL (ref 0–0.7)
Eosinophils Relative: 1 %
HCT: 44.6 % (ref 35.0–47.0)
Hemoglobin: 15 g/dL (ref 12.0–16.0)
LYMPHS PCT: 12 %
Lymphs Abs: 1.8 10*3/uL (ref 1.0–3.6)
MCH: 32.8 pg (ref 26.0–34.0)
MCHC: 33.6 g/dL (ref 32.0–36.0)
MCV: 97.5 fL (ref 80.0–100.0)
MONOS PCT: 7 %
Metamyelocytes Relative: 0 %
Monocytes Absolute: 1.1 10*3/uL — ABNORMAL HIGH (ref 0.2–0.9)
Myelocytes: 0 %
NEUTROS ABS: 12 10*3/uL — AB (ref 1.4–6.5)
NRBC: 0 /100{WBCs}
Neutrophils Relative %: 79 %
OTHER: 0 %
PLATELETS: 300 10*3/uL (ref 150–440)
Promyelocytes Absolute: 0 %
RBC: 4.57 MIL/uL (ref 3.80–5.20)
RDW: 13.2 % (ref 11.5–14.5)
WBC: 15.3 10*3/uL — ABNORMAL HIGH (ref 3.6–11.0)

## 2016-02-01 LAB — COMPREHENSIVE METABOLIC PANEL
ALT: 16 U/L (ref 14–54)
AST: 21 U/L (ref 15–41)
Albumin: 3.6 g/dL (ref 3.5–5.0)
Alkaline Phosphatase: 136 U/L — ABNORMAL HIGH (ref 38–126)
Anion gap: 9 (ref 5–15)
BUN: 50 mg/dL — ABNORMAL HIGH (ref 6–20)
CHLORIDE: 109 mmol/L (ref 101–111)
CO2: 22 mmol/L (ref 22–32)
Calcium: 9.6 mg/dL (ref 8.9–10.3)
Creatinine, Ser: 1.67 mg/dL — ABNORMAL HIGH (ref 0.44–1.00)
GFR, EST AFRICAN AMERICAN: 35 mL/min — AB (ref >60)
GFR, EST NON AFRICAN AMERICAN: 30 mL/min — AB (ref >60)
Glucose, Bld: 153 mg/dL — ABNORMAL HIGH (ref 65–99)
POTASSIUM: 3.9 mmol/L (ref 3.5–5.1)
Sodium: 140 mmol/L (ref 135–145)
TOTAL PROTEIN: 7.5 g/dL (ref 6.5–8.1)
Total Bilirubin: 0.8 mg/dL (ref 0.3–1.2)

## 2016-02-01 NOTE — Telephone Encounter (Signed)
-----   Message from [name], MD sent at 02/01/2016  3:44 PM EDT ----- CT of abd/ pelvis re: lower abdominal pain, leukocytosis. PO contrast only.   ----- Message -----    From: [facility] [place] Interface    Sent: 02/01/2016   2:56 PM      To: [name], MD

## 2016-02-01 NOTE — Telephone Encounter (Signed)
Patient has been scheduled for a CT abdomen/pelvis without contrast (oral contrast only) at [facility] for Monday, 02-06-16 at 11 am (arrive 10:45 am). Prep: NPO 4 hours prior and pick up prep kit. Patient verbalizes understanding.

## 2016-02-06 ENCOUNTER — Ambulatory Visit
Admission: RE | Admit: 2016-02-06 | Discharge: 2016-02-06 | Disposition: A | Discharge status: Home / Self Care | Payer: Medicare Other | Source: Ambulatory Visit | Attending: General Surgery | Admitting: General Surgery

## 2016-02-06 DIAGNOSIS — K5660 Unspecified intestinal obstruction: Secondary | ICD-10-CM | POA: Insufficient documentation

## 2016-02-06 DIAGNOSIS — N201 Calculus of ureter: Secondary | ICD-10-CM | POA: Diagnosis not present

## 2016-02-06 DIAGNOSIS — D72829 Elevated white blood cell count, unspecified: Secondary | ICD-10-CM | POA: Insufficient documentation

## 2016-02-06 DIAGNOSIS — R1084 Generalized abdominal pain: Secondary | ICD-10-CM

## 2016-02-06 DIAGNOSIS — N281 Cyst of kidney, acquired: Secondary | ICD-10-CM | POA: Insufficient documentation

## 2016-02-07 ENCOUNTER — Telehealth: Payer: Self-pay | Admitting: *Deleted

## 2016-02-07 ENCOUNTER — Other Ambulatory Visit: Payer: Self-pay

## 2016-02-07 DIAGNOSIS — C541 Malignant neoplasm of endometrium: Secondary | ICD-10-CM

## 2016-02-07 NOTE — Telephone Encounter (Signed)
Patient called wanting to find out her CT Results.

## 2016-02-07 NOTE — Telephone Encounter (Signed)
Patient informed a CT results. Arrangements in place for evaluation by medical oncology and GYN oncology tomorrow afternoon at 3 PM at the cancer center. Further intervention based on their assessment.

## 2016-02-08 ENCOUNTER — Encounter: Payer: Self-pay | Admitting: Oncology

## 2016-02-08 ENCOUNTER — Inpatient Hospital Stay: Payer: Medicare Other

## 2016-02-08 ENCOUNTER — Inpatient Hospital Stay (HOSPITAL_BASED_OUTPATIENT_CLINIC_OR_DEPARTMENT_OTHER): Payer: Medicare Other | Admitting: Obstetrics and Gynecology

## 2016-02-08 ENCOUNTER — Inpatient Hospital Stay: Payer: Medicare Other | Attending: Oncology | Admitting: Oncology

## 2016-02-08 VITALS — BP 124/80 | HR 112 | Temp 95.1°F | Resp 18 | Wt 165.3 lb

## 2016-02-08 VITALS — BP 124/80 | HR 95 | Temp 97.0°F | Ht 61.0 in | Wt 165.0 lb

## 2016-02-08 DIAGNOSIS — C541 Malignant neoplasm of endometrium: Secondary | ICD-10-CM

## 2016-02-08 DIAGNOSIS — Z9221 Personal history of antineoplastic chemotherapy: Secondary | ICD-10-CM

## 2016-02-08 DIAGNOSIS — Z90710 Acquired absence of both cervix and uterus: Secondary | ICD-10-CM

## 2016-02-08 DIAGNOSIS — F17210 Nicotine dependence, cigarettes, uncomplicated: Secondary | ICD-10-CM

## 2016-02-08 DIAGNOSIS — N281 Cyst of kidney, acquired: Secondary | ICD-10-CM | POA: Diagnosis not present

## 2016-02-08 DIAGNOSIS — K5660 Unspecified intestinal obstruction: Secondary | ICD-10-CM

## 2016-02-08 DIAGNOSIS — E669 Obesity, unspecified: Secondary | ICD-10-CM | POA: Insufficient documentation

## 2016-02-08 DIAGNOSIS — Z933 Colostomy status: Secondary | ICD-10-CM | POA: Insufficient documentation

## 2016-02-08 DIAGNOSIS — N201 Calculus of ureter: Secondary | ICD-10-CM | POA: Diagnosis not present

## 2016-02-08 DIAGNOSIS — C778 Secondary and unspecified malignant neoplasm of lymph nodes of multiple regions: Secondary | ICD-10-CM | POA: Diagnosis not present

## 2016-02-08 DIAGNOSIS — N828 Other female genital tract fistulae: Secondary | ICD-10-CM | POA: Diagnosis not present

## 2016-02-08 DIAGNOSIS — R591 Generalized enlarged lymph nodes: Secondary | ICD-10-CM | POA: Diagnosis not present

## 2016-02-08 DIAGNOSIS — Z8542 Personal history of malignant neoplasm of other parts of uterus: Secondary | ICD-10-CM

## 2016-02-08 DIAGNOSIS — Z79899 Other long term (current) drug therapy: Secondary | ICD-10-CM

## 2016-02-08 DIAGNOSIS — I471 Supraventricular tachycardia: Secondary | ICD-10-CM | POA: Insufficient documentation

## 2016-02-08 DIAGNOSIS — Z90722 Acquired absence of ovaries, bilateral: Secondary | ICD-10-CM | POA: Insufficient documentation

## 2016-02-08 DIAGNOSIS — R1909 Other intra-abdominal and pelvic swelling, mass and lump: Secondary | ICD-10-CM | POA: Insufficient documentation

## 2016-02-08 DIAGNOSIS — K432 Incisional hernia without obstruction or gangrene: Secondary | ICD-10-CM

## 2016-02-08 LAB — COMPREHENSIVE METABOLIC PANEL
ALBUMIN: 3.7 g/dL (ref 3.5–5.0)
ALT: 14 U/L (ref 14–54)
ANION GAP: 13 (ref 5–15)
AST: 22 U/L (ref 15–41)
Alkaline Phosphatase: 129 U/L — ABNORMAL HIGH (ref 38–126)
BILIRUBIN TOTAL: 0.4 mg/dL (ref 0.3–1.2)
BUN: 43 mg/dL — ABNORMAL HIGH (ref 6–20)
CHLORIDE: 108 mmol/L (ref 101–111)
CO2: 20 mmol/L — AB (ref 22–32)
Calcium: 8.8 mg/dL — ABNORMAL LOW (ref 8.9–10.3)
Creatinine, Ser: 1.61 mg/dL — ABNORMAL HIGH (ref 0.44–1.00)
GFR calc Af Amer: 37 mL/min — ABNORMAL LOW (ref >60)
GFR calc non Af Amer: 32 mL/min — ABNORMAL LOW (ref >60)
GLUCOSE: 104 mg/dL — AB (ref 65–99)
POTASSIUM: 4 mmol/L (ref 3.5–5.1)
SODIUM: 141 mmol/L (ref 135–145)
TOTAL PROTEIN: 7.2 g/dL (ref 6.5–8.1)

## 2016-02-08 LAB — CBC WITH DIFFERENTIAL/PLATELET
BASOS PCT: 1 %
Basophils Absolute: 0.1 10*3/uL (ref 0–0.1)
EOS ABS: 0.2 10*3/uL (ref 0–0.7)
EOS PCT: 2 %
HCT: 41.4 % (ref 35.0–47.0)
Hemoglobin: 14.2 g/dL (ref 12.0–16.0)
Lymphocytes Relative: 22 %
Lymphs Abs: 2.2 10*3/uL (ref 1.0–3.6)
MCH: 33.3 pg (ref 26.0–34.0)
MCHC: 34.4 g/dL (ref 32.0–36.0)
MCV: 97 fL (ref 80.0–100.0)
MONO ABS: 0.7 10*3/uL (ref 0.2–0.9)
MONOS PCT: 7 %
Neutro Abs: 6.9 10*3/uL — ABNORMAL HIGH (ref 1.4–6.5)
Neutrophils Relative %: 68 %
PLATELETS: 312 10*3/uL (ref 150–440)
RBC: 4.27 MIL/uL (ref 3.80–5.20)
RDW: 13.4 % (ref 11.5–14.5)
WBC: 10.1 10*3/uL (ref 3.6–11.0)

## 2016-02-08 NOTE — Progress Notes (Signed)
Patient has ostomy in right pelvic area. Surgery by Dr. Bary Castilla.  Patient has a mass in her right side.  She is not processing her food well and everything is coming out liquid.  Referred by Dr. Bary Castilla.  Diet has been reduced to soft/liquid diet.  Patient has history of hernia repair X 3.

## 2016-02-08 NOTE — Progress Notes (Signed)
  Oncology Nurse Navigator Documentation  Navigator Location: [facility] (02/08/16 1600) Navigator Encounter Type: [facility] Follow-up (02/08/16 1600)                                          Time Spent with Patient: 30 (02/08/16 1600)   Chaperoned pelvic exam

## 2016-02-08 NOTE — Progress Notes (Signed)
[place] @ [facility] [phone]  Fax:([phone]) [place] OB: [birth year]  MR#: [id number]  SWN#:[id number]  Patient Care Team: Juline Patch, MD as PCP - General (Family Medicine) [name], MD (General Surgery) Juline Patch, MD as Referring Physician (Family Medicine)  CHIEF COMPLAINT:  Chief Complaint  Patient presents with  . Endometrial Cancer     Chief Complaint/Diagnosis:   Adenocarcinoma endometrium, grade 3.  Stage III c2 Status post hysterectomy followed by chemotherapy with carboplatinum and Taxol.  6 cycles followed by brachii therapy.  Finished in March of 2012.   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  . 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  status post colostomy History of incisional hernia     INTERVAL HISTORY:  70 year old lady who ordered a previous multiple admission in the hospital for small bowel obstruction.  Colo vagina fistula. Patient has not been feeling well over . of last few months.  Losing weight appetite is poor.  Abdominal discomfort. Patient was evaluated by surgeon is CT scan was done with removal pelvic mass and lymphadenopathy and patient has been referred to me and Dr. Fransisca Connors for further evaluation.  The patient has a colostomy.  Here for further evaluation.  Patient also has increasing abdominal discomfort  REVIEW OF SYSTEMS:   Gen. status: Patient is feeling weak tired somewhat depressed.   Abdomen: Progressive abdominal discomfort.  Poor appetite.   Lower extremity intermittent swelling.   HEENT: No headache no dizziness.   Cardiac: No chest pain.   Lungs: No shortness of breath.   Musculoskeletal system no bony pains.   Neurological system continues to have some tingling and numbness in lower extremity no other focal symptoms  All other systems have been reviewed  As per HPI. Otherwise,  a complete review of systems is negatve.  PAST MEDICAL HISTORY: Past Medical History  Diagnosis Date  . Diverticulitis of colon (without mention of hemorrhage)   . Mechanical complication of colostomy and enterostomy   . Cancer [facility]) 2011    endometrial  . Personal history of tobacco use, presenting hazards to health   . History of kidney stones 2011  . Obesity, unspecified     PAST SURGICAL HISTORY: Past Surgical History  Procedure Laterality Date  . Appendectomy    . Lithotripsy  2009  . Tubal ligation    . Pyelolithotomy    . Colostomy  2011  . Abdominal hysterectomy  2011  . Dilation and curettage of uterus    . Mole removal  2003  . Hysteroscopy  2011  . Colonoscopy  June 2010,03/31/14    Tubular adenoma of the transverse colon, hyperplastic polyps of the sigmoid.  . 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 Colon surgery  05/20/1999    Drainage of pelvic abscess, end colostomy  . Colon surgery  11/15/2010    Revision of colostomy stoma  . Skin cancer removal  02/2014  . Hernia repair  04/06/14    ventral hernia, tetro rectus Ventralex ST mesh, 20 x 33 cm.   . Cholecystectomy  05/07/14    Incidental during ventral hernia repair.     FAMILY HISTORY Family History  Problem Relation Age of Onset  . Breast cancer Maternal Grandmother   . Colon cancer Mother   . Cancer  Mother     ADVANCED DIRECTIVES:  No flowsheet data found.  HEALTH MAINTENANCE: Social History  Substance Use Topics  . Smoking status: Current Every Day Smoker -- 15 years    Types: Cigarettes  . Smokeless tobacco: Never Used  . Alcohol Use: Yes      No Known Allergies  No current outpatient prescriptions on file.   No current facility-administered medications for this visit.    OBJECTIVE:  Filed Vitals:   02/08/16 1523  BP: 124/80  Pulse: 112  Temp: 95.1 F (35.1 C)  Resp: 18     Body mass index is  31.25 kg/(m^2).    ECOG FS:1 - Symptomatic but completely ambulatory  PHYSICAL EXAM: Gen. status: Patient is alert oriented not any acute distress Slightly depressed or anxious Moderately obese Lymphatic system: Supraclavicular, cervical, axillary, inguinal lymph nodes are not palpable Abdomen is mildly distended.  Colostomy is functioning. Tenderness in the right lower quadrant. Cardiac: Tachycardia soft systolic murmur Lungs: Air entry on both side dictation or rhonchi no rales Lower extremity no edema. Neurologically, the patient was awake, alert, and oriented to person, place and time. There were no obvious focal neurologic abnormalities.  Musculoskeletal system no abnormality detected  LAB RESULTS:  CBC Latest Ref Rng 02/08/2016 02/01/2016  WBC 3.6 - 11.0 K/uL 10.1 15.3(H)  Hemoglobin 12.0 - 16.0 g/dL 14.2 15.0  Hematocrit 35.0 - 47.0 % 41.4 44.6  Platelets 150 - 440 K/uL 312 300    Appointment on 02/08/2016  Component Date Value Ref Range Status  . WBC 02/08/2016 10.1  3.6 - 11.0 K/uL Final  . RBC 02/08/2016 4.27  3.80 - 5.20 MIL/uL Final  . Hemoglobin 02/08/2016 14.2  12.0 - 16.0 g/dL Final  . HCT 02/08/2016 41.4  35.0 - 47.0 % Final  . MCV 02/08/2016 97.0  80.0 - 100.0 fL Final  . MCH 02/08/2016 33.3  26.0 - 34.0 pg Final  . MCHC 02/08/2016 34.4  32.0 - 36.0 g/dL Final  . RDW 02/08/2016 13.4  11.5 - 14.5 % Final  . Platelets 02/08/2016 312  150 - 440 K/uL Final  . Neutrophils Relative % 02/08/2016 68   Final  . Neutro Abs 02/08/2016 6.9* 1.4 - 6.5 K/uL Final  . Lymphocytes Relative 02/08/2016 22   Final  . Lymphs Abs 02/08/2016 2.2  1.0 - 3.6 K/uL Final  . Monocytes Relative 02/08/2016 7   Final  . Monocytes Absolute 02/08/2016 0.7  0.2 - 0.9 K/uL Final  . Eosinophils Relative 02/08/2016 2   Final  . Eosinophils Absolute 02/08/2016 0.2  0 - 0.7 K/uL Final  . Basophils Relative 02/08/2016 1   Final  . Basophils Absolute 02/08/2016 0.1  0 - 0.1 K/uL Final  . Sodium  02/08/2016 141  135 - 145 mmol/L Final  . Potassium 02/08/2016 4.0  3.5 - 5.1 mmol/L Final  . Chloride 02/08/2016 108  101 - 111 mmol/L Final  . CO2 02/08/2016 20* 22 - 32 mmol/L Final  . Glucose, Bld 02/08/2016 104* 65 - 99 mg/dL Final  . BUN 02/08/2016 43* 6 - 20 mg/dL Final  . Creatinine, Ser 02/08/2016 1.61* 0.44 - 1.00 mg/dL Final  . Calcium 02/08/2016 8.8* 8.9 - 10.3 mg/dL Final  . Total Protein 02/08/2016 7.2  6.5 - 8.1 g/dL Final  . Albumin 02/08/2016 3.7  3.5 - 5.0 g/dL Final  . AST 02/08/2016 22  15 - 41 U/L Final  . ALT 02/08/2016 14  14 - 54 U/L Final  .  Alkaline Phosphatase 02/08/2016 129* 38 - 126 U/L Final  . Total Bilirubin 02/08/2016 0.4  0.3 - 1.2 mg/dL Final  . GFR calc non Af Amer 02/08/2016 32* >60 mL/min Final  . GFR calc Af Amer 02/08/2016 37* >60 mL/min Final   Comment: (NOTE) The eGFR has been calculated using the CKD EPI equation. This calculation has not been validated in all clinical situations. eGFR's persistently <60 mL/min signify possible Chronic Kidney Disease.   . Anion gap 02/08/2016 13  5 - 15 Final       STUDIES: Ct Abdomen Pelvis Wo Contrast  02/06/2016  CLINICAL DATA:  Abdominal pain and cramping for 3 weeks with liquid stools. EXAM: CT ABDOMEN AND PELVIS WITHOUT CONTRAST TECHNIQUE: Multidetector CT imaging of the abdomen and pelvis was performed following the standard protocol without IV contrast. COMPARISON:  CT abdomen and pelvis 03/31/2014. FINDINGS: The lung bases are clear.  No pleural or pericardial effusion. Small bowel loops are dilated up to 4.2 cm with air-fluid levels present. Transition point is identified in the pelvis and best seen on image 50 where there is a grossly abnormal segment of small bowel measuring approximately 4 cm in length. This segment demonstrates marked wall thickening and contrast appears to extend into the posterior wall of this loop. It also appears to have a fistulous communication with a loop immediately  anterior to it. The distal ileum is completely decompressed. There is some contrast within the colon. Left lower quadrant ostomy is noted. The patient has a spiculated left periaortic lymph node measuring 1.9 cm on image 27 which measured 1 cm on the prior CT. There is also a new mass lesion in the right hemipelvis measuring approximately 3.8 x 2.9 cm on image 61. The gallbladder has been removed. The spleen, adrenal glands and pancreas appear normal. The patient has innumerable bilateral renal cysts as seen on the prior study. Some of these lesions demonstrate increased attenuation likely representing complex cysts although they cannot be definitively characterized. There is no right hydronephrosis but the patient has a 0.6 cm right ureteral stone anterior to the sacrum. Aortoiliac atherosclerosis is identified. Multilevel lumbar spondylosis is seen. No lytic or sclerotic bony lesion is identified. IMPRESSION: The study is positive for partial small bowel obstruction. Transition point is due to a small bowel mass lesion in the pelvis as described above. As noted, this loop of small bowel appears to the fistulous communication with a loop immediately anterior to it. Differential considerations include small bowel lymphoma or possibly adenocarcinoma. Pathologic left periaortic lymph node consistent with metastatic disease. New mass lesion in the right hemipelvis may represent metastatic lymphadenopathy. Alternatively, it could be the due to an ovarian tumor. 0.6 cm right ureteral stone without hydronephrosis. Innumerable bilateral renal cysts. Electronically Signed   By: Inge Rise M.D.   On: 02/06/2016 14:57    ASSESSMENT: Abnormal CT scan which has been reviewed independently and reviewed with the patient and with GYN oncologist. Previous history of carcinoma of endometrium with pelvic lymph node stage IIIc status post 6 cycles of chemotherapy and brachytherapy History of farm small bowel obstruction,  colostomy, incisional hernia repair andcolo-vaginal fistula  MEDICAL DECISION MAKING:  I had a prolonged discussion with patient and GYN oncologist.  Possibility of recurrent endometrial cancer cannot be ruled out Possibility of other disease-like lymphoma remains a consideration Proceed with CT-guided biopsy will contact IR physician We will try to get core biopsies of patient can be evaluated to rule out lymphoma as well  as also begin to MSI study Tumor markers and other blood tests has been evaluated.  Patient does have mild renal dysfunction. Mild leukocytosis  PET scan for further staging workup has been ordered  If PET scan does not show any disease outside abdomen possibility of IMR T radiation therapy questionable surgical intervention because of possible partial small bowel obstruction/ GYN oncologist to discuss the case with Dr. Bary Castilla.  After biopsy as well as PET scanning patient would be evaluated by Dr. Baruch Gouty and by Dr. Mike Gip  Total duration of visit was 45 minutes.  50% or more time was spent in counseling patient and family regarding prognosis and options of treatment and available resources Patient expressed understanding and was in agreement with this plan. She also understands that She can call clinic at any time with any questions, concerns, or complaints.    No matching staging information was found for the patient.  Forest Gleason, MD   02/08/2016 5:55 PM

## 2016-02-08 NOTE — Progress Notes (Signed)
Gynecologic Oncology Consult Visit   Referring Provider:  Dr Oliva Bustard  Chief Concern: recurrent endometrial cancer  Subjective:  [name] is a 70 y.o. female who is seen in consultation from Dr. Oliva Bustard for recurrent endometrial cancer.  The patient had a short episode of postmenopausal bleeding. She was diagnosed with adenocarcinoma of the endometrium on D&C.  Procedure was complicated by uterine perforation. She underwent total abdominal hysterectomy and bilateral salpingo-oophorectomy with full staging biopsies in 02/2010. Pathology revealed grade III endometrial carcinoma.  Tumor extending through the thickness of the myometrium. Tumor was 3 x 3 cm. There were 2 of 6 right pelvic lymph nodes and 2 of 6 periaortic nodes involved with metastatic disease. Peritoneal washings were negative.  Pathologic stage was T3N2M0 (stage III c2).  She received 6 cycles of carboplatin and Taxol from 03/27/2010 until 08/23/2010.  She noted grade I/II neuropathy.  During chemotherapy, in 05/2010 she developed a colovaginal fistula and underwent colostomy. She had a re-anastomosis although developed a leak and underwent diverting colostomy.  He has had 3 hernias.  Her last complication was on Labor Day 2014.  Post chemotherapy she underwent brachytherapy (completed 11/2010).  Ventral hernia repair by Dr Bary Castilla 8/15. She was last seen in medical oncology clinic on 07/23/2014 by Dr. Oliva Bustard.  She was noted to have had multiple hospital admissions due to adhesions and small bowel obstructions.   Saw Dr Bary Castilla and was having watery stools and he ordered a CT scan.    CT scan 02/07/16  IMPRESSION: The study is positive for partial small bowel obstruction. Transition point is due to a small bowel mass lesion in the pelvis as described above. As noted, this loop of small bowel appears to the fistulous communication with a loop immediately anterior to it. Differential considerations include small bowel lymphoma  or possibly adenocarcinoma.  Pathologic left periaortic lymph node consistent with metastatic disease.  New mass lesion in the right hemipelvis may represent metastatic lymphadenopathy. Alternatively, it could be the due to an ovarian tumor.  0.6 cm right ureteral stone without hydronephrosis.  Innumerable bilateral renal cysts.   Problem List: Patient Active Problem List   Diagnosis Date Noted  . Watery stools 01/23/2016  . Generalized abdominal pain 01/23/2016  . Endometrial cancer ([place]) 02/10/2015  . Paroxysmal supraventricular tachycardia ([place]) 05/24/2014  . History of endometrial cancer 04/09/2014  . Tubular adenomas removed from the transverse colon in June 2010. Hyperplastic polyps identified in the sigmoid colon. 03/02/2014  . 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 Diverticulitis of colon (without mention of hemorrhage)   . Mechanical complication of colostomy and enterostomy   . Cancer [facility]) 2011    endometrial  . Personal history of tobacco use, presenting hazards to health   . History of kidney stones 2011  . Obesity, unspecified     Past Surgical History: Past Surgical History  Procedure Laterality Date  . Appendectomy    . Lithotripsy  2009  . Tubal ligation    . Pyelolithotomy    . Colostomy  2011  . Abdominal hysterectomy  2011  . Dilation and curettage of uterus    . Mole removal  2003  . Hysteroscopy  2011  . Colonoscopy  June 2010,03/31/14    Tubular adenoma of the transverse colon, hyperplastic polyps of the sigmoid.  . Colon surgery  05/10/2010    Sigmoid colectomy with takedown of colovaginal fistula, drainage pelvic side wall  seroma  . Colon surgery  05/20/1999    Drainage of pelvic abscess, end colostomy  . Colon surgery  11/15/2010    Revision of colostomy stoma  . Skin cancer removal  02/2014  . Hernia repair  04/06/14    ventral hernia, tetro rectus Ventralex ST  mesh, 20 x 33 cm.   . Cholecystectomy  05/07/14    Incidental during ventral hernia repair.     Family History: Family History  Problem Relation Age of Onset  . Breast cancer Maternal Grandmother   . Colon cancer Mother   . Cancer Mother     Social History: Social History   Social History  . Marital Status: Married    Spouse Name: N/A  . Number of Children: N/A  . Years of Education: N/A   Occupational History  . Not on file.   Social History Main Topics  . Smoking status: Current Every Day Smoker -- 15 years    Types: Cigarettes  . Smokeless tobacco: Never Used  . Alcohol Use: Yes  . Drug Use: No  . Sexual Activity: Not on file   Other Topics Concern  . Not on file   Social History Narrative    Allergies: No Known Allergies  Current Medications: Current Outpatient Prescriptions  Medication Sig Dispense Refill  . ALPRAZolam (XANAX) 0.25 MG tablet Take 1 tablet (0.25 mg total) by mouth 1 day or 1 dose. 15 tablet 1  . sertraline (ZOLOFT) 50 MG tablet Take 1 tablet (50 mg total) by mouth daily. 30 tablet 1   No current facility-administered medications for this visit.    Review of Systems  Per HPI   Objective:  Physical Examination:  BP 124/80 mmHg  Pulse 95  Temp(Src) 97 F (36.1 C)  Ht 5' 1"  (1.549 m)  Wt 165 lb (74.844 kg)  BMI 31.19 kg/m2   ECOG Performance Status: 1 - Symptomatic but completely ambulatory  General appearance: alert, cooperative and appears stated age HEENT:PERRLA, neck supple with midline trachea and thyroid without masses Lymph node survey: non-palpable, axillary, inguinal, supraclavicular Cardiovascular: regular rate and rhythm, no murmurs or gallops Respiratory: normal air entry, lungs clear to auscultation and no rales, rhonchi or wheezing Breast exam: not done Abdomen: soft, non-tender, without masses or organomegaly, no hernias and well healed incision Back: inspection of back is normal Extremities: extremities normal,  atraumatic, no cyanosis or edema Skin exam - normal coloration and turgor, no rashes, no suspicious skin lesions noted. Neurological exam reveals alert, oriented, normal speech, no focal findings or movement disorder noted.  Pelvic: exam chaperoned by nurse;  Vulva: normal appearing vulva with no masses, tenderness or lesions; Vagina: normal vagina; Bimanual/RV: no masses    Assessment:  [name] is a 70 y.o. female diagnosed with probable recurrent stage IIIC2 grade 3 endometrial carcinoma involving 4-5 cm right pelvic sidewall mass and high left aortic node.  She underwent hysterectomy with BSO and node dissection in 2011 and had positive pelvic and aortic nodes and was treated with carbo/taxol and vaginal brachytherapy.  Medical co-morbidities complicating care: colostomy, abdominal hernia repairs.  Plan:   Problem List Items Addressed This Visit      Genitourinary   Endometrial cancer ([place]) - Primary     I discussed options with Dr. Oliva Bustard and the patient and her husband.  Suggested FNA of one of the masses to establish diagnosis of endometrial cancer recurrence. And would also get a PET scan to rule out other metastatic disease. If  disease is confined to nodal areas would treat with external radiation.  If she has other metastatic disease would consider carbo/taxol chemotherapy and would check MSI testing to see if she may be a candidate for Pembroluzimab.    There is some concern for a small bowel to small bowel fistula in the pelvis on the CT scan and partial SBO. And her colostomy output has become more watery.  Dr Bary Castilla is out the next few days, but will speak to him when he gets back to get his impression regarding whether this is something that requires surgical intervention, and whether his could complicate the ability to give radiation therapy.  Perhaps the radiation could be delivered just to the two lesions rather than full pelvic RT with an aortic chimney.    The  patient's diagnosis, an outline of the further diagnostic and laboratory studies which will be required, the recommendation, and alternatives were discussed.  All questions were answered to the patient's satisfaction.  Mellody Drown, MD   CC:  Juline Patch, MD [address] [place], [place] [address] [phone]

## 2016-02-09 ENCOUNTER — Other Ambulatory Visit: Payer: Self-pay | Admitting: Family Medicine

## 2016-02-09 ENCOUNTER — Other Ambulatory Visit: Payer: Self-pay

## 2016-02-09 LAB — CA 125: CA 125: 102.5 U/mL — ABNORMAL HIGH (ref 0.0–38.1)

## 2016-02-13 ENCOUNTER — Telehealth: Payer: Self-pay

## 2016-02-13 NOTE — Telephone Encounter (Signed)
  Oncology Nurse Navigator Documentation  Navigator Location: [facility] Onc (02/13/16 1200) Navigator Encounter Type: Telephone (02/13/16 1200) Telephone: [place] Crocker Call;Appt Confirmation/Clarification (02/13/16 1200)                                        Time Spent with Patient: 15 (02/13/16 1200)   CT guided biopsy has been arranged for 6/20 at 10:00 at [facility] medical mall entrance. Notified Mrs Denney of date/time. Instructions per CT. Also notified Nancee Liter in scheduling that this date/time is okay per patient. Needs follow up with Dr Mike Gip and [place] following biopsy. Scheduling notified.

## 2016-02-16 ENCOUNTER — Encounter
Admission: RE | Admit: 2016-02-16 | Discharge: 2016-02-16 | Disposition: A | Discharge status: Home / Self Care | Payer: Medicare Other | Source: Ambulatory Visit | Attending: Oncology | Admitting: Oncology

## 2016-02-16 DIAGNOSIS — C541 Malignant neoplasm of endometrium: Secondary | ICD-10-CM

## 2016-02-16 LAB — GLUCOSE, CAPILLARY: Glucose-Capillary: 131 mg/dL — ABNORMAL HIGH (ref 65–99)

## 2016-02-16 MED ORDER — FLUDEOXYGLUCOSE F - 18 (FDG) INJECTION
12.0000 | Freq: Once | INTRAVENOUS | Status: AC | PRN
  Administered 2016-02-16: 12.75 via INTRAVENOUS

## 2016-02-20 ENCOUNTER — Other Ambulatory Visit: Payer: Self-pay | Admitting: Radiology

## 2016-02-21 ENCOUNTER — Ambulatory Visit
Admission: RE | Admit: 2016-02-21 | Discharge: 2016-02-21 | Disposition: A | Discharge status: Home / Self Care | Payer: Medicare Other | Source: Ambulatory Visit | Attending: Oncology | Admitting: Oncology

## 2016-02-21 DIAGNOSIS — F17210 Nicotine dependence, cigarettes, uncomplicated: Secondary | ICD-10-CM | POA: Diagnosis not present

## 2016-02-21 DIAGNOSIS — Z9889 Other specified postprocedural states: Secondary | ICD-10-CM | POA: Insufficient documentation

## 2016-02-21 DIAGNOSIS — C541 Malignant neoplasm of endometrium: Secondary | ICD-10-CM | POA: Insufficient documentation

## 2016-02-21 DIAGNOSIS — Z87442 Personal history of urinary calculi: Secondary | ICD-10-CM | POA: Insufficient documentation

## 2016-02-21 DIAGNOSIS — K5732 Diverticulitis of large intestine without perforation or abscess without bleeding: Secondary | ICD-10-CM | POA: Diagnosis not present

## 2016-02-21 DIAGNOSIS — Z9049 Acquired absence of other specified parts of digestive tract: Secondary | ICD-10-CM | POA: Diagnosis not present

## 2016-02-21 DIAGNOSIS — Z933 Colostomy status: Secondary | ICD-10-CM | POA: Insufficient documentation

## 2016-02-21 LAB — PROTIME-INR
INR: 1.07
PROTHROMBIN TIME: 14.1 s (ref 11.4–15.0)

## 2016-02-21 LAB — APTT: aPTT: 34 seconds (ref 24–36)

## 2016-02-21 MED ORDER — MIDAZOLAM HCL 2 MG/2ML IJ SOLN
INTRAMUSCULAR | Status: AC | PRN
  Administered 2016-02-21 (×2): 1 mg via INTRAVENOUS

## 2016-02-21 MED ORDER — FENTANYL CITRATE (PF) 100 MCG/2ML IJ SOLN
INTRAMUSCULAR | Status: AC
  Filled 2016-02-21: qty 2

## 2016-02-21 MED ORDER — MIDAZOLAM HCL 5 MG/5ML IJ SOLN
INTRAMUSCULAR | Status: AC
  Filled 2016-02-21: qty 10

## 2016-02-21 MED ORDER — FENTANYL CITRATE (PF) 100 MCG/2ML IJ SOLN
INTRAMUSCULAR | Status: AC | PRN
  Administered 2016-02-21 (×2): 50 ug via INTRAVENOUS

## 2016-02-21 MED ORDER — SODIUM CHLORIDE FLUSH 0.9 % IV SOLN
INTRAVENOUS | Status: DC
Start: 2016-02-21 — End: 2016-02-22
  Filled 2016-02-21: qty 20

## 2016-02-21 MED ORDER — SODIUM CHLORIDE 0.9 % IV SOLN
Freq: Once | INTRAVENOUS | Status: AC
  Administered 2016-02-21: 13:00:00 via INTRAVENOUS

## 2016-02-21 NOTE — Procedures (Signed)
R pelvic mass biopsy 18 g core times 4 No comp/EBL

## 2016-02-21 NOTE — Discharge Instructions (Signed)
Needle Biopsy, Care After °Refer to this sheet in the next few weeks. These instructions provide you with information about caring for yourself after your procedure. Your health care provider may also give you more specific instructions. Your treatment has been planned according to current medical practices, but problems sometimes occur. Call your health care provider if you have any problems or questions after your procedure. °WHAT TO EXPECT AFTER THE PROCEDURE °After your procedure, it is common to have soreness, bruising, or mild pain at the biopsy site. This should go away in a few days. °HOME CARE INSTRUCTIONS °· Rest as directed by your health care provider. °· Take medicines only as directed by your health care provider. °· There are many different ways to close and cover the biopsy site, including stitches (sutures), skin glue, and adhesive strips. Follow your health care provider's instructions about: °¨ Biopsy site care. °¨ Bandage (dressing) changes and removal. °¨ Biopsy site closure removal. °· Check your biopsy site every day for signs of infection. Watch for: °¨ Redness, swelling, or pain. °¨ Fluid, blood, or pus. °SEEK MEDICAL CARE IF: °· You have a fever. °· You have redness, swelling, or pain at the biopsy site that lasts longer than a few days. °· You have fluid, blood, or pus coming from the biopsy site. °· You feel nauseous. °· You vomit. °SEEK IMMEDIATE MEDICAL CARE IF: °· You have shortness of breath. °· You have trouble breathing. °· You have chest pain.   °· You feel dizzy or you faint. °· You have bleeding that does not stop with pressure or a bandage. °· You cough up blood. °· You have pain in your abdomen. °  °This information is not intended to replace advice given to you by your health care provider. Make sure you discuss any questions you have with your health care provider. °  °Document Released: 01/04/2015 Document Reviewed: 01/04/2015 °Elsevier Interactive Patient Education ©2016  [employer] ° °

## 2016-02-21 NOTE — H&P (Signed)
Chief Complaint: Patient was seen in consultation today for pelvic biopsy at the request of [place]  Referring Physician(s): Fowler  Supervising Physician: Marybelle Killings  Patient Status: Outpatient  History of Present Illness: [name] is a 70 y.o. female with a history of endometrial cancer, now with suspected recurrence based on a PET scan. 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  Past Medical History  Diagnosis Date  . Diverticulitis of colon (without mention of hemorrhage)   . Mechanical complication of colostomy and enterostomy   . Cancer [facility]) 2011    endometrial  . Personal history of tobacco use, presenting hazards to health   . History of kidney stones 2011  . Obesity, unspecified     Past Surgical History  Procedure Laterality Date  . Appendectomy    . Lithotripsy  2009  . Tubal ligation    . Pyelolithotomy    . Colostomy  2011  . Abdominal hysterectomy  2011  . Dilation and curettage of uterus    . Mole removal  2003  . Hysteroscopy  2011  . Colonoscopy  June 2010,03/31/14    Tubular adenoma of the transverse colon, hyperplastic polyps of the sigmoid.  . 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 Colon surgery  05/20/1999    Drainage of pelvic abscess, end colostomy  . Colon surgery  11/15/2010    Revision of colostomy stoma  . Skin cancer removal  02/2014  . Hernia repair  04/06/14    ventral hernia, tetro rectus Ventralex ST mesh, 20 x 33 cm.   . Cholecystectomy  05/07/14    Incidental during ventral hernia repair.     Allergies: Review of patient's allergies indicates no known allergies.  Medications: Prior to Admission medications   Not on File     Family History  Problem Relation Age of Onset  . Breast cancer Maternal Grandmother   . Colon cancer Mother   . Cancer Mother     Social History   Social  History  . Marital Status: Married    Spouse Name: N/A  . Number of Children: N/A  . Years of Education: N/A   Social History Main Topics  . Smoking status: Current Every Day Smoker -- 15 years    Types: Cigarettes  . Smokeless tobacco: Never Used  . Alcohol Use: Yes  . Drug Use: No  . Sexual Activity: Not on file   Other Topics Concern  . Not on file   Social History Narrative     Review of Systems: A 12 point ROS discussed and pertinent positives are indicated in the HPI above.  All other systems are negative.  Review of Systems  Vital Signs: There were no vitals taken for this visit.  Physical Exam  Constitutional: She is oriented to person, place, and time. She appears well-developed and well-nourished.  HENT:  Head: Normocephalic and atraumatic.  Cardiovascular: Normal rate and regular rhythm.   Pulmonary/Chest: Effort normal and breath sounds normal.  Abdominal: Soft.  LLQ ostomy  Neurological: She is alert and oriented to person, place, and time.    Mallampati Score:     Imaging: Ct Abdomen Pelvis Wo Contrast  02/06/2016  CLINICAL DATA:  Abdominal pain and cramping for 3 weeks with liquid stools. EXAM: CT ABDOMEN AND PELVIS WITHOUT CONTRAST TECHNIQUE: Multidetector CT imaging of the abdomen and  pelvis was performed following the standard protocol without IV contrast. COMPARISON:  CT abdomen and pelvis 03/31/2014. FINDINGS: The lung bases are clear.  No pleural or pericardial effusion. Small bowel loops are dilated up to 4.2 cm with air-fluid levels present. Transition point is identified in the pelvis and best seen on image 50 where there is a grossly abnormal segment of small bowel measuring approximately 4 cm in length. This segment demonstrates marked wall thickening and contrast appears to extend into the posterior wall of this loop. It also appears to have a fistulous communication with a loop immediately anterior to it. The distal ileum is completely  decompressed. There is some contrast within the colon. Left lower quadrant ostomy is noted. The patient has a spiculated left periaortic lymph node measuring 1.9 cm on image 27 which measured 1 cm on the prior CT. There is also a new mass lesion in the right hemipelvis measuring approximately 3.8 x 2.9 cm on image 61. The gallbladder has been removed. The spleen, adrenal glands and pancreas appear normal. The patient has innumerable bilateral renal cysts as seen on the prior study. Some of these lesions demonstrate increased attenuation likely representing complex cysts although they cannot be definitively characterized. There is no right hydronephrosis but the patient has a 0.6 cm right ureteral stone anterior to the sacrum. Aortoiliac atherosclerosis is identified. Multilevel lumbar spondylosis is seen. No lytic or sclerotic bony lesion is identified. IMPRESSION: The study is positive for partial small bowel obstruction. Transition point is due to a small bowel mass lesion in the pelvis as described above. As noted, this loop of small bowel appears to the fistulous communication with a loop immediately anterior to it. Differential considerations include small bowel lymphoma or possibly adenocarcinoma. Pathologic left periaortic lymph node consistent with metastatic disease. New mass lesion in the right hemipelvis may represent metastatic lymphadenopathy. Alternatively, it could be the due to an ovarian tumor. 0.6 cm right ureteral stone without hydronephrosis. Innumerable bilateral renal cysts. Electronically Signed   By: Inge Rise M.D.   On: 02/06/2016 14:57   Nm Pet Image Restag (ps) Skull Base To Thigh  02/16/2016  CLINICAL DATA:  Subsequent treatment strategy for recurrent endometrial cancer. Restaging examination. Abnormal CT scan. EXAM: NUCLEAR MEDICINE PET SKULL BASE TO THIGH TECHNIQUE: 12.75 mCi F-18 FDG was injected intravenously. Full-ring PET imaging was performed from the skull base to thigh  after the radiotracer. CT data was obtained and used for attenuation correction and anatomic localization. FASTING BLOOD GLUCOSE:  Value: 131 mg/dl COMPARISON:  CT the abdomen and pelvis 02/06/2016. CT of the chest, abdomen and pelvis 10/22/2011. FINDINGS: NECK No hypermetabolic lymph nodes in the neck. CHEST 13 mm short axis right retrocrural hypermetabolic lymph node (SUVmax = 7.8) on image 123 of series 3. No other hypermetabolic mediastinal or hilar nodes. No suspicious pulmonary nodules on the CT scan. Heart size is normal. There is no significant pericardial fluid, thickening or pericardial calcification. There is atherosclerosis of the thoracic aorta, the great vessels of the mediastinum and the coronary arteries, including calcified atherosclerotic plaque in the left main, left anterior descending and left circumflex coronary arteries. No acute consolidative airspace disease. No pleural effusions. Scattered linear opacities in the lungs bilaterally may reflect areas of subsegmental atelectasis or linear scarring. ABDOMEN/PELVIS In the right hemipelvis along the right pelvic side wall there is a infiltrative mass which measures 4.4 x 5.0 cm (image 196 of series 3) which is hypermetabolic (SUVmax = 123456). 2.2 cm short  axis left para-aortic lymph node is hypermetabolic (SUVmax = 123456 image 139 of series 3. No abnormal hypermetabolic activity within the liver, pancreas, adrenal glands, or spleen. Mild diffuse low attenuation throughout the hepatic parenchyma, compatible with hepatic steatosis. Gallbladder is not visualized, and may be surgically absent (no surgical clips are noted in the gallbladder fossa). Innumerable lesions in the kidneys bilaterally varying from low to intermediate to high attenuation, incompletely characterized on today's noncontrast CT examination, but statistically likely to represent a combination of simple cysts and proteinaceous/hemorrhagic cysts. Small calcifications in the lower  pole of the right kidney, compatible with nonobstructive calculi, largest which measures 4 mm. In the mid right ureter (image 179 of series 3) there is a 6 mm calculus. At this time there is no associated hydroureteronephrosis. Notably, shortly beneath the the right ureteral stone of the right ureter comes in close proximity to the pelvic mass (discussed above), which likely exerts extrinsic mass effect upon the ureter. No significant volume of ascites. No pneumoperitoneum. There are again multiple dilated loops of small bowel and multiple air-fluid levels, with small bowel measuring up to 5.1 cm in diameter. This appears similar to the prior CT scan 02/06/2016. Status post partial colectomy with Hartmann's pouch and left lower quadrant colostomy. Some gas and stool are noted throughout the residual colon. SKELETON Mixed lucent and sclerotic lesion in the right ilium just lateral to the right sacroiliac joint which currently measures 5.2 x 2.5 cm is hypermetabolic (SUVmax = 5.9). No other focal hypermetabolic activity to suggest skeletal metastasis. IMPRESSION: 1. 4.4 x 5.0 cm hypermetabolic soft tissue mass along the right pelvic sidewall, presumably malignant metastatic lymphadenopathy in this patient with history of endometrial carcinoma. There is also metastatic lymphadenopathy in the left para-aortic nodal station adjacent to the left renal hilum, and in the right retrocrural nodal station. In addition, there is a metastatic lesion in the right ilium, as discussed above. 2. Evidence of persistent partial small bowel obstruction again noted. 3. 6 mm calculus in the middle third of the right ureter, without proximal hydroureteronephrosis to indicate obstruction at this time. Multiple nonobstructive calculi also noted in the lower pole collecting system of the right kidney measuring up to 4 mm. 4. Hepatic steatosis. 5. Atherosclerosis, including left main and 2 vessel coronary artery disease. Please note that  although the presence of coronary artery calcium documents the presence of coronary artery disease, the severity of this disease and any potential stenosis cannot be assessed on this non-gated CT examination. Assessment for potential risk factor modification, dietary therapy or pharmacologic therapy may be warranted, if clinically indicated. 6. Morphologic changes in the kidneys suggestive of underlying polycystic kidney disease, as above. 7. Additional incidental findings, as above. Electronically Signed   By: Vinnie Langton M.D.   On: 02/16/2016 10:34    Labs:  CBC:  Recent Labs  01/23/16 1558 02/01/16 1424 02/08/16 1620  WBC 10.7 15.3* 10.1  HGB  --  15.0 14.2  HCT 44.0 44.6 41.4  PLT 386* 300 312    COAGS: No results for input(s): INR, APTT in the last 8760 hours.  BMP:  Recent Labs  01/23/16 1558 02/01/16 1424 02/08/16 1620  NA 141 140 141  K 4.0 3.9 4.0  CL 100 109 108  CO2 17* 22 20*  GLUCOSE 123* 153* 104*  BUN 61* 50* 43*  CALCIUM 9.7 9.6 8.8*  CREATININE 1.91* 1.67* 1.61*  GFRNONAA 27* 30* 32*  GFRAA 31* 35* 37*    LIVER  FUNCTION TESTS:  Recent Labs  01/23/16 1558 02/01/16 1424 02/08/16 1620  BILITOT 0.4 0.8 0.4  AST 15 21 22   ALT 15 16 14   ALKPHOS 128* 136* 129*  PROT 6.9 7.5 7.2  ALBUMIN 4.0 3.6 3.7    TUMOR MARKERS: No results for input(s): AFPTM, CEA, CA199, CHROMGRNA in the last 8760 hours.  Assessment and Plan:  Right pelvic mass. 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  Thank you for this interesting consult.  I greatly enjoyed meeting ELIZABET MACIA and look forward to participating in their care.  A copy of this report was sent to the requesting provider on this date.  Electronically Signed: Jayelyn Barno, ART A 02/21/2016, 12:33 PM   I spent a total of  30 minutes   in face to face in clinical consultation, greater than 50% of which was counseling/coordinating care for pelvic biopsy.

## 2016-02-22 ENCOUNTER — Telehealth: Payer: Self-pay | Admitting: *Deleted

## 2016-02-22 ENCOUNTER — Telehealth: Payer: Self-pay | Admitting: Internal Medicine

## 2016-02-22 NOTE — Telephone Encounter (Signed)
[place] like pt is to follow up with you. I received a call from path; Dr.Rubinas; CT guided bx- suggestive of endometrial cancer- awaiting to review previous path slides. Thx Govinda.

## 2016-02-22 NOTE — Telephone Encounter (Signed)
Patient called wanting to talk to you directly regarding her bowels. She stated that it is nothing urgent and to call her at your convince.

## 2016-02-24 ENCOUNTER — Ambulatory Visit: Payer: Medicare Other | Admitting: Hematology and Oncology

## 2016-02-27 ENCOUNTER — Encounter: Payer: Self-pay | Admitting: Radiation Oncology

## 2016-02-27 ENCOUNTER — Telehealth: Payer: Self-pay | Admitting: *Deleted

## 2016-02-27 ENCOUNTER — Inpatient Hospital Stay (HOSPITAL_BASED_OUTPATIENT_CLINIC_OR_DEPARTMENT_OTHER): Payer: Medicare Other | Admitting: Hematology and Oncology

## 2016-02-27 ENCOUNTER — Ambulatory Visit
Admission: RE | Admit: 2016-02-27 | Discharge: 2016-02-27 | Disposition: A | Discharge status: Home / Self Care | Payer: Medicare Other | Source: Ambulatory Visit | Attending: Radiation Oncology | Admitting: Radiation Oncology

## 2016-02-27 ENCOUNTER — Encounter: Payer: Self-pay | Admitting: Hematology and Oncology

## 2016-02-27 VITALS — BP 109/76 | HR 106 | Temp 96.2°F | Resp 16 | Ht 61.0 in | Wt 164.6 lb

## 2016-02-27 DIAGNOSIS — Z87891 Personal history of nicotine dependence: Secondary | ICD-10-CM | POA: Diagnosis not present

## 2016-02-27 DIAGNOSIS — C779 Secondary and unspecified malignant neoplasm of lymph node, unspecified: Secondary | ICD-10-CM | POA: Insufficient documentation

## 2016-02-27 DIAGNOSIS — K5730 Diverticulosis of large intestine without perforation or abscess without bleeding: Secondary | ICD-10-CM | POA: Insufficient documentation

## 2016-02-27 DIAGNOSIS — R197 Diarrhea, unspecified: Secondary | ICD-10-CM | POA: Diagnosis not present

## 2016-02-27 DIAGNOSIS — F17210 Nicotine dependence, cigarettes, uncomplicated: Secondary | ICD-10-CM

## 2016-02-27 DIAGNOSIS — Z87442 Personal history of urinary calculi: Secondary | ICD-10-CM | POA: Insufficient documentation

## 2016-02-27 DIAGNOSIS — C778 Secondary and unspecified malignant neoplasm of lymph nodes of multiple regions: Secondary | ICD-10-CM | POA: Diagnosis not present

## 2016-02-27 DIAGNOSIS — C541 Malignant neoplasm of endometrium: Secondary | ICD-10-CM | POA: Diagnosis present

## 2016-02-27 DIAGNOSIS — Z79899 Other long term (current) drug therapy: Secondary | ICD-10-CM

## 2016-02-27 DIAGNOSIS — E669 Obesity, unspecified: Secondary | ICD-10-CM | POA: Insufficient documentation

## 2016-02-27 DIAGNOSIS — R1084 Generalized abdominal pain: Secondary | ICD-10-CM

## 2016-02-27 DIAGNOSIS — Z923 Personal history of irradiation: Secondary | ICD-10-CM | POA: Diagnosis not present

## 2016-02-27 DIAGNOSIS — R195 Other fecal abnormalities: Secondary | ICD-10-CM

## 2016-02-27 NOTE — Progress Notes (Signed)
Colostomy present and has liquid stools.  Pt reports tumor pressing on intestines

## 2016-02-27 NOTE — Consult Note (Signed)
Except an outstanding is perfect of Radiation Oncology NEW PATIENT EVALUATION  Name: [name]  MRN: [id number]  Date:   02/27/2016     DOB: March 24, 1946   This 70 y.o. female patient presents to the clinic for initial evaluation of recurrent and mutual carcinoma status post vaginal brachytherapy over 5 years prior.  REFERRING PHYSICIAN: Juline Patch, MD  CHIEF COMPLAINT:  Chief Complaint  Patient presents with  . Cancer    Pt is here for initial consultation of endometrial cancer.     DIAGNOSIS: The encounter diagnosis was Endometrial cancer ([place]).   PREVIOUS INVESTIGATIONS:  PET CT scan reviewed Pathology report reviewed Clinical notes including prior history reviewed  HPI: Patient is a 70 year old female well known to our department having been treated back in 2012 for FIGO grade 3 endometrial carcinoma with full-thickness invasion of the endometrium and 412 pelvic lymph nodes involved with metastatic disease. 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 She went carbotaxol chemotherapy followed by vaginal brachytherapy 2350 cGy in 5 fractions. She has done remarkably well until recently when she would developed a small bowel obstruction and found to have a mass in the pelvis. There appeared to be of small bowel loop with fistula communication anterior to it. She underwent PET CT scan showing hypermetabolic activity in a 5 x 4.5 cm right pelvic sidewall mass presumably malignant metastatic lymphadenopathy. There is also noted to be hypermetabolic activity adjacent to the left renal hilum left para-aortic station and right retrocrural nodal station. CT-guided mass biopsy performed 02/21/2016 showed high-grade metastatic adenocarcinoma consistent with endometrial carcinoma. She's been seen by both GYN oncology and surgeon. Plan at this time is to proceed with chemotherapy and possibly Keytruda. I been asked to  evaluate the patient for possible palliative radiation. Her major complaint is loose stools and some what she describes as roaring in her abdomen. She is in no significant pain at this time. She specifically denies any vaginal bleeding or discharge at this time  PLANNED TREATMENT REGIMEN: Chemotherapy  PAST MEDICAL HISTORY:  has a past medical history of Diverticulitis of colon (without mention of hemorrhage); Mechanical complication of colostomy and enterostomy; Cancer [facility]) (2011); Personal history of tobacco use, presenting hazards to health; History of kidney stones (2011); and Obesity, unspecified.    PAST SURGICAL HISTORY:  Past Surgical History  Procedure Laterality Date  . Appendectomy    . Lithotripsy  2009  . Tubal ligation    . Pyelolithotomy    . Colostomy  2011  . Abdominal hysterectomy  2011  . Dilation and curettage of uterus    . Mole removal  2003  . Hysteroscopy  2011  . Colonoscopy  June 2010,03/31/14    Tubular adenoma of the transverse colon, hyperplastic polyps of the sigmoid.  . 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 Colon surgery  05/20/1999    Drainage of pelvic abscess, end colostomy  . Colon surgery  11/15/2010    Revision of colostomy stoma  . Skin cancer removal  02/2014  . Hernia repair  04/06/14    ventral hernia, tetro rectus Ventralex ST mesh, 20 x 33 cm.   . Cholecystectomy  05/07/14    Incidental during ventral hernia repair.     FAMILY HISTORY: family history includes Breast cancer in her maternal grandmother; Cancer in her mother; Colon cancer in her mother.  SOCIAL  HISTORY:  reports that she has been smoking Cigarettes.  She has smoked for the past 15 years. She has never used smokeless tobacco. She reports that she does not drink alcohol or use illicit drugs.  ALLERGIES: Review of patient's allergies indicates no known allergies.  MEDICATIONS:  Current Outpatient  Prescriptions  Medication Sig Dispense Refill  . ALPRAZolam (XANAX) 0.25 MG tablet     . sertraline (ZOLOFT) 50 MG tablet      No current facility-administered medications for this encounter.    ECOG PERFORMANCE STATUS:  1 - Symptomatic but completely ambulatory  REVIEW OF SYSTEMS: Except for the bowel problems occasional slight leakage around her ostomy Patient denies any weight loss, fatigue, weakness, fever, chills or night sweats. Patient denies any loss of vision, blurred vision. Patient denies any ringing  of the ears or hearing loss. No irregular heartbeat. Patient denies heart murmur or history of fainting. Patient denies any chest pain or pain radiating to her upper extremities. Patient denies any shortness of breath, difficulty breathing at night, cough or hemoptysis. Patient denies any swelling in the lower legs. Patient denies any nausea vomiting, vomiting of blood, or coffee ground material in the vomitus. Patient denies any stomach pain. Patient states has had normal bowel movements no significant constipation or diarrhea. Patient denies any dysuria, hematuria or significant nocturia. Patient denies any problems walking, swelling in the joints or loss of balance. Patient denies any skin changes, loss of hair or loss of weight. Patient denies any excessive worrying or anxiety or significant depression. Patient denies any problems with insomnia. Patient denies excessive thirst, polyuria, polydipsia. Patient denies any swollen glands, patient denies easy bruising or easy bleeding. Patient denies any recent infections, allergies or URI. Patient "s visual fields have not changed significantly in recent time.    PHYSICAL EXAM: There were no vitals taken for this visit. She is a functioning colostomy present. Positive bowel sounds in all 4 quadrants. Well-developed well-nourished patient in NAD. HEENT reveals PERLA, EOMI, discs not visualized.  Oral cavity is clear. No oral mucosal lesions  are identified. Neck is clear without evidence of cervical or supraclavicular adenopathy. Lungs are clear to A&P. Cardiac examination is essentially unremarkable with regular rate and rhythm without murmur rub or thrill. Abdomen is benign with no organomegaly or masses noted. Motor sensory and DTR levels are equal and symmetric in the upper and lower extremities. Cranial nerves II through XII are grossly intact. Proprioception is intact. No peripheral adenopathy or edema is identified. No motor or sensory levels are noted. Crude visual fields are within normal range.  LABORATORY DATA: Pathology reports reviewed    RADIOLOGY RESULTS: PET CT scan reviewed   IMPRESSION: Progressive stage IV endometrial carcinoma in 70 year old female treated with vaginal brachytherapy in 2012  PLAN: At this time I have reviewed and discussed the case personally with both medical oncology and surgeon. I believe palliative radiation therapy to this large pelvic mass will only increase her GI toxicity and produce more symptoms. I also would not offer palliative radiation therapy at this time based on the other distant nodal involvement including her aortic lymph nodes. I believe best course of action at this time would be attempted chemotherapy and use the pelvic mass as a tumor marker response. She's or to schedule to start chemotherapy and is scheduled to have a port placed. I've explained my rationale for declining pelvic palliative radiation therapy at this time the patient is perfect agreement.  I would like to take  this opportunity to thank you for allowing me to participate in the care of your patient.Armstead Peaks., MD

## 2016-02-27 NOTE — Telephone Encounter (Signed)
[name] from [facility] wants to schedule a port placement ASAP. She states they want to start treatment next week. She was last seen 01/23/16. Please advise. Thanks

## 2016-02-27 NOTE — Progress Notes (Addendum)
[facility] day:  02/27/2016  Chief Complaint: [name] is a 70 y.o. female with a history of stage III endometrial cancer who is seen for reassessment.   HPI:  The patient was last seen by me in the medical oncology clinic on 01/26/2015.  At that time, she was seen for initial assessment by me.  Labs included a CA125 of 40.5.  Follow-up was scheduled in 6 months.  The patient described a several month history of not feeling well.  She has had abdominal discomfort and had lost weight.  She has had watery stools.  Abdomen and pelvic CT scan on 02/06/2016 revealed partial small bowel obstruction with a transition point due to a small bowel mass lesion in the pelvis  There was pathologic left periaortic lymph node consistent with metastatic disease.  There was a new mass lesion in the right hemipelvis.  She was seen by Dr. Oliva Bustard on 02/08/2016.  CA125 was 102.5.  PET scan and CT guided biopsy were scheduled.  PET scan on 02/16/2016 revealed a 4.4 x 5.0 cm hypermetabolic soft tissue mass along the right pelvic sidewall, presumably malignant metastatic lymphadenopathy.  There was also metastatic lymphadenopathy in the left para-aortic nodal station adjacent to the left renal hilum, and in the right retrocrural nodal Station.  There was a metastatic lesion in the right ilium.  There was evidence of persistent partial small bowel obstruction.  CT guided biopsy of the right pelvic mass on 02/21/2016 confirmed high grade metastatic adenocarcinoma c/w endometrial carcinoma  She was seen by Dr. Fransisca Connors on 02/08/2016.  Treatment options were discussed including radiation (if disease confined to nodal areas), systemic therapy (carboplatin and Taxol) or Keytruda (if MSI documents MMR).  The patient is scheduled to see radiation oncology this afternoon.  Symptomatically, she notes being uncomfortable.  She has crampy abdominal pain.  She has had diarrhea x  1 month.  She often has overflow from her colostomy.   Past Medical History  Diagnosis Date  . Diverticulitis of colon (without mention of hemorrhage)   . Mechanical complication of colostomy and enterostomy   . Cancer [facility]) 2011    endometrial  . Personal history of tobacco use, presenting hazards to health   . History of kidney stones 2011  . Obesity, unspecified     Past Surgical History  Procedure Laterality Date  . Appendectomy    . Lithotripsy  2009  . Tubal ligation    . Pyelolithotomy    . Colostomy  2011  . Abdominal hysterectomy  2011  . Dilation and curettage of uterus    . Mole removal  2003  . Hysteroscopy  2011  . Colonoscopy  June 2010,03/31/14    Tubular adenoma of the transverse colon, hyperplastic polyps of the sigmoid.  . 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 Colon surgery  05/20/1999    Drainage of pelvic abscess, end colostomy  . Colon surgery  11/15/2010    Revision of colostomy stoma  . Skin cancer removal  02/2014  . Hernia repair  04/06/14    ventral hernia, tetro rectus Ventralex ST mesh, 20 x 33 cm.   . Cholecystectomy  05/07/14    Incidental during ventral hernia repair.     Family History  Problem Relation Age of Onset  . Breast cancer Maternal Grandmother   . Colon cancer Mother   .  Cancer Mother     Social History:  reports that she has been smoking Cigarettes.  She has smoked for the past 15 years. She has never used smokeless tobacco. She reports that she does not drink alcohol or use illicit drugs.  She smokes 7 cigarettes a day plus vapor cigarettes.  She doesn't want to quit smoking.  She is a retired Marine scientist.  The patient is accompaied by her husband,  [name], today.  Allergies: No Known Allergies  Current Medications: Current Outpatient Prescriptions  Medication Sig Dispense Refill  . ALPRAZolam (XANAX) 0.25 MG tablet     . sertraline (ZOLOFT) 50 MG  tablet      No current facility-administered medications for this visit.    Review of Systems:  GENERAL:  Fatigue.  No fevers, sweats or weight loss. PERFORMANCE STATUS (ECOG):  1 HEENT:  No visual changes, runny nose, sore throat, mouth sores or tenderness. Lungs: No shortness of breath or cough.  No hemoptysis. Cardiac:  No chest pain, palpitations, orthopnea, or PND. GI:  Abdominal discomfort and cramps.  Watery diarrhea x 1 month.  No nausea, vomiting, constipation, melena or hematochezia. GU:  No urgency, frequency, dysuria, or hematuria. Musculoskeletal:  No back pain.  No joint pain.  No muscle tenderness. Extremities:  No pain or swelling. Skin:  No rashes or skin changes. Neuro:  No headache, numbness or weakness, balance or coordination issues. Endocrine:  No diabetes, thyroid issues, hot flashes or night sweats. Psych:  Poor sleep.  No mood changes, depression or anxiety. Pain: Abdominal discomfort/pain. Review of systems:  All other systems reviewed and found to be negative.  Physical Exam: Blood pressure 109/76, pulse 106, temperature 96.2 F (35.7 C), temperature source Tympanic, resp. rate 16, height 5' 1"  (1.549 m), weight 164 lb 9.2 oz (74.65 kg). GENERAL:  Well developed, well nourished, woman sitting comfortably in the exam room in no acute distress. MENTAL STATUS:  Alert and oriented to person, place and time. HEAD:  Short gray hair.  Normocephalic, atraumatic, face symmetric, no Cushingoid features. EYES:  Blue eyes.  Pupils equal round and reactive to light and accomodation.  No conjunctivitis or scleral icterus. ENT:  Oropharynx clear without lesion.  Tongue normal. Mucous membranes moist.  RESPIRATORY:  Clear to auscultation without rales, wheezes or rhonchi. CARDIOVASCULAR:  Regular rate and rhythm without murmur, rub or gallop. ABDOMEN:  Colostomy.  Soft, non-tender, with active bowel sounds, and no hepatosplenomegaly.  No masses. SKIN:  No rashes, ulcers or  lesions. EXTREMITIES: No edema, no skin discoloration or tenderness.  No palpable cords. LYMPH NODES: No palpable cervical, supraclavicular, axillary or inguinal adenopathy  NEUROLOGICAL: Unremarkable. PSYCH:  Appropriate.  No visits with results within 3 Day(s) from this visit. Latest known visit with results is:  Hospital Outpatient Visit on 02/21/2016  Component Date Value Ref Range Status  . aPTT 02/21/2016 34  24 - 36 seconds Final  . Prothrombin Time 02/21/2016 14.1  11.4 - 15.0 seconds Final  . INR 02/21/2016 1.07   Final  . SURGICAL PATHOLOGY 02/21/2016    Final                   Value:Surgical Pathology CASE: [id number] PATIENT: Fidel Levy Surgical Pathology Report     SPECIMEN SUBMITTED: A. Pelvic mass, right B. Pelvic mass, right  CLINICAL HISTORY: T3N2M0 (stage III c2) endometrial carcinoma, grade 3 in 2011  PRE-OPERATIVE DIAGNOSIS: Probable recurrent endometrial carcinoma involving 4-5 cm right pelvic sidewall mass and high left  aortic node  POST-OPERATIVE DIAGNOSIS: Same as pre op     DIAGNOSIS: A. PELVIC MASS, RIGHT; BIOPSY: - HIGH GRADE METASTATIC ADENOCARCINOMA, MORPHOLOGICALLY CONSISTENT WITH PATIENT'S KNOWN HISTORY OF ENDOMETRIAL CARCINOMA.  B. PELVIC MASS, RIGHT; CT-GUIDED BIOPSY: - HIGH GRADE METASTATIC ADENOCARCINOMA, MORPHOLOGICALLY CONSISTENT WITH PATIENT'S KNOWN HISTORY OF ENDOMETRIAL CARCINOMA.  Comment: The patient has a history of poorly differentiated endometrial adenocarcinoma (FIGO 3)  diagnosed in June 2011. A limited panel of immunohistochemical stains was performed to sub classify the type of en                         dometrial carcinoma. Stain results: p53: positive (focal strong subset in a background of wild type pattern)  ER: positive (diffuse) p 16: positive (focal strong subset) Napsin: negative Stain controls worked appropriately. This pattern of immunoreactivity is consistent with high grade endometrial  adenocarcinoma, endometrioid type with focal aberrant expression of p53 and p16.  The staining pattern is not consistent with serous carcinoma. Preliminary findings were communicated to Dr. Rogue Bussing on 02/22/2016.  GROSS DESCRIPTION:  A. Labeled: right pelvic mass  Tissue fragment(s): 2  Size: cores of pink-tan tissue, and aggregate 2.0 x 0.1 x 0.1 cm  Description: cores received in saline, touch prep prepared with Diff Quik stain slide, following staining instructed by Dr. Reuel Derby entirely formalin fixed, wrapped in lens paper, marked blue and submitted in a mesh bag  Entirely submitted in one cassette(s).   B. Labeled: right pelvic mass  Tissue fragment(s): 3                           Size: cores of pink-tan tissue 0.3-2 cm in length and in diameter 0.1 cm  Description: pink-tan course, wrapped in lens paper, marked blue and submitted in a mesh bag  Entirely submitted in 1 cassette(s).    Final Diagnosis performed by Quay Burow, MD.  Electronically signed 02/24/2016 1:43:43PM    The electronic signature indicates that the named Attending Pathologist has evaluated the specimen  Technical component performed at [facility], [address] [place], [place] [address] Lab: [phone] Dir: Darrick Penna. Evette Doffing, MD  Professional component performed at [facility], [facility], [place], [place], [place] [address] Lab: [phone] Dir: Dellia Nims. Reuel Derby, MD      Assessment:  LAURAINE CRESPO is a 70 y.o. female with a history of stage III endometrial carcinoma stats post TAH/BSO in 02/2010.  Pathology revealed grade III endometrial carcinoma.  Tumor was 3 x 3 cm and extended through the thickness of the myometrium.  There were 2 of 6 right pelvic lymph nodes and 2 of 6 periaortic nodes involved with metastatic disease. Peritoneal washings were negative.  Pathologic stage was T3N2M0 (stage III c2).  She received 6 cycles of carboplatin and Taxol  from 03/27/2010 until 08/23/2010.  She received brachytherapy (completed 11/2010).  She developed a grade I/II neuropathy.  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 Following re-anastomosis, she developed a leak and underwent diverting colostomy.  She has had 3 incisional hernias.  PET scan on 02/16/2016 revealed a 4.4 x 5.0 cm hypermetabolic soft tissue mass along the right pelvic sidewall, presumably malignant metastatic lymphadenopathy.  There was also metastatic lymphadenopathy in the left para-aortic nodal station adjacent to the left renal hilum, and in the right retrocrural nodal Station.  There was a metastatic lesion in the right ilium.  There was evidence  of persistent partial small bowel obstruction.  CA125 results are as follows: 19 on 01/19/2014, 23.6 on 07/23/2014, 40.5 on 01/26/2015, and 102.5 on 02/08/2016.  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   Symptomatically, she has crampy abdominal discomfort.  She has a month history of watery diarrhea.  Exam is stable.  Plan: 1.  Discuss interim events, imaging studies, and CT guided biopsy.  Discuss treatment options.  Discuss likely plan for systemic chemotherapy (carboplatin and Taxol).  Doubt patient is a good candidate for radiation.  Discuss submitting tissue for MSI and data for Keytruda.  Patient wishes to proceed with chemotherapy quickly.  Patient received carboplatin and Taxol in 2011.  She is aware of potential side effects.  Await follow-up with radiation oncology today. 2.  Discuss port-a-cath placement with Dr. Bary Castilla. 3.  Preauth carboplatin and taxol. 4.  Tissue for MSI testing. 5.  RTC on 03/07/2016 for MD assessment, labs (CBC with diff, CMP, Mg, CA125), and cycle #1 carboplatin and Taxol.  Addendum:  I spoke with Dr. Baruch Gouty today after her appointment with him.  He believes radiation to the pelvis  will only increase her GI toxicity and produce more symptoms.   Lequita Asal, MD  02/27/2016

## 2016-02-27 NOTE — Telephone Encounter (Signed)
Patient called the office back and date confirmed. Instructions reviewed.

## 2016-02-27 NOTE — Telephone Encounter (Signed)
Called pt's home and spoke to her husband and he confirmed that they have spoke to Dr. Bary Castilla office and port will be placed thurs-6/29.  Scheduled an appt for pt to start chemo 03/07/16 in chemo in [place] and pt given appt time 8:45. They will be there.

## 2016-02-27 NOTE — Telephone Encounter (Signed)
Message for patient to call the office.   Port placement has been scheduled at [facility] for Thursday, 03-01-16 at [facility]. We need to confirm that this date is okay for the patient. Also, need to review surgery instructions. No pre-op visit will be required per Dr. Bary Castilla.   Sherry at the [facility] is aware of date.

## 2016-02-28 ENCOUNTER — Other Ambulatory Visit: Payer: Self-pay | Admitting: Hematology and Oncology

## 2016-02-28 ENCOUNTER — Other Ambulatory Visit: Payer: Self-pay | Admitting: General Surgery

## 2016-02-28 ENCOUNTER — Encounter: Payer: Self-pay | Admitting: *Deleted

## 2016-02-28 ENCOUNTER — Inpatient Hospital Stay: Admission: RE | Admit: 2016-02-28 | Payer: Medicare Other | Source: Ambulatory Visit

## 2016-02-28 DIAGNOSIS — C541 Malignant neoplasm of endometrium: Secondary | ICD-10-CM

## 2016-02-28 MED ORDER — ONDANSETRON HCL 8 MG PO TABS: 8.0000 mg | ORAL_TABLET | Freq: Two times a day (BID) | ORAL | Status: DC | PRN

## 2016-02-28 NOTE — Patient Instructions (Signed)
  Your procedure is scheduled on: 03-01-16 [facility]) Report to Same Day Surgery 2nd floor medical mall To find out your arrival time please call [phone] between 1PM - 3PM on  02-29-16 [facility])  Remember: Instructions that are not followed completely may result in serious medical risk, up to and including death, or upon the discretion of your surgeon and anesthesiologist your surgery may need to be rescheduled.    _x___ 1. Do not eat food or drink liquids after midnight. No gum chewing or hard candies.     __x__ 2. No Alcohol for 24 hours before or after surgery.   __x__3. No Smoking for 24 prior to surgery.   ____  4. Bring all medications with you on the day of surgery if instructed.    __x__ 5. Notify your doctor if there is any change in your medical condition     (cold, fever, infections).     Do not wear jewelry, make-up, hairpins, clips or nail polish.  Do not wear lotions, powders, or perfumes. You may wear deodorant.  Do not shave 48 hours prior to surgery. Men may shave face and neck.  Do not bring valuables to the hospital.    [facility] is not responsible for any belongings or valuables.               Contacts, dentures or bridgework may not be worn into surgery.  Leave your suitcase in the car. After surgery it may be brought to your room.  For patients admitted to the hospital, discharge time is determined by your treatment team.   Patients discharged the day of surgery will not be allowed to drive home.    Please read over the following fact sheets that you were given:   [facility] Preparing for Surgery and or MRSA Information   _x___ Take these medicines the morning of surgery with A SIP OF WATER:    1. ZOLOFT (SERTRALINE)  2. MAY TAKE XANAX IF NEEDED AM OF SURGERY  3.  4.  5.  6.  ____ Fleet Enema (as directed)   _x___ Use CHG Soap or sage wipes as directed on instruction sheet   ____ Use inhalers on the day of surgery and bring to hospital day  of surgery  ____ Stop metformin 2 days prior to surgery    ____ Take 1/2 of usual insulin dose the night before surgery and none on the morning of surgery.   ____ Stop aspirin or coumadin, or plavix  _x__ Stop Anti-inflammatories such as Advil, Aleve, Ibuprofen, Motrin, Naproxen,          Naprosyn, Goodies powders or aspirin products. Ok to take Tylenol.   ____ Stop supplements until after surgery.    ____ Bring C-Pap to the hospital.

## 2016-02-29 ENCOUNTER — Other Ambulatory Visit: Payer: Medicare Other

## 2016-02-29 ENCOUNTER — Encounter
Admission: RE | Admit: 2016-02-29 | Discharge: 2016-02-29 | Disposition: A | Discharge status: Home / Self Care | Payer: Medicare Other | Source: Ambulatory Visit | Attending: General Surgery | Admitting: General Surgery

## 2016-02-29 ENCOUNTER — Ambulatory Visit: Payer: Medicare Other | Admitting: Hematology and Oncology

## 2016-02-29 ENCOUNTER — Ambulatory Visit: Payer: Medicare Other

## 2016-02-29 DIAGNOSIS — Z87442 Personal history of urinary calculi: Secondary | ICD-10-CM | POA: Diagnosis not present

## 2016-02-29 DIAGNOSIS — Z8719 Personal history of other diseases of the digestive system: Secondary | ICD-10-CM | POA: Diagnosis not present

## 2016-02-29 DIAGNOSIS — F17210 Nicotine dependence, cigarettes, uncomplicated: Secondary | ICD-10-CM | POA: Diagnosis not present

## 2016-02-29 DIAGNOSIS — C541 Malignant neoplasm of endometrium: Secondary | ICD-10-CM | POA: Diagnosis not present

## 2016-02-29 DIAGNOSIS — Z79899 Other long term (current) drug therapy: Secondary | ICD-10-CM | POA: Diagnosis not present

## 2016-02-29 DIAGNOSIS — E669 Obesity, unspecified: Secondary | ICD-10-CM | POA: Diagnosis not present

## 2016-02-29 LAB — SURGICAL PATHOLOGY

## 2016-03-01 ENCOUNTER — Encounter: Payer: Self-pay | Admitting: *Deleted

## 2016-03-01 ENCOUNTER — Ambulatory Visit: Payer: Medicare Other

## 2016-03-01 ENCOUNTER — Ambulatory Visit: Payer: Medicare Other | Admitting: Anesthesiology

## 2016-03-01 ENCOUNTER — Ambulatory Visit
Admission: RE | Admit: 2016-03-01 | Discharge: 2016-03-01 | Disposition: A | Discharge status: Home / Self Care | Payer: Medicare Other | Source: Ambulatory Visit | Attending: General Surgery | Admitting: General Surgery

## 2016-03-01 ENCOUNTER — Encounter
Admission: RE | Disposition: A | Discharge status: Home / Self Care | Payer: Self-pay | Source: Ambulatory Visit | Attending: General Surgery

## 2016-03-01 DIAGNOSIS — C541 Malignant neoplasm of endometrium: Secondary | ICD-10-CM | POA: Diagnosis not present

## 2016-03-01 DIAGNOSIS — Z87442 Personal history of urinary calculi: Secondary | ICD-10-CM | POA: Insufficient documentation

## 2016-03-01 DIAGNOSIS — Z95828 Presence of other vascular implants and grafts: Secondary | ICD-10-CM

## 2016-03-01 DIAGNOSIS — Z8719 Personal history of other diseases of the digestive system: Secondary | ICD-10-CM | POA: Insufficient documentation

## 2016-03-01 DIAGNOSIS — F17210 Nicotine dependence, cigarettes, uncomplicated: Secondary | ICD-10-CM | POA: Insufficient documentation

## 2016-03-01 DIAGNOSIS — Z79899 Other long term (current) drug therapy: Secondary | ICD-10-CM | POA: Insufficient documentation

## 2016-03-01 DIAGNOSIS — E669 Obesity, unspecified: Secondary | ICD-10-CM | POA: Insufficient documentation

## 2016-03-01 HISTORY — PX: PORTACATH PLACEMENT: SHX2246

## 2016-03-01 HISTORY — DX: Chronic kidney disease, unspecified: N18.9

## 2016-03-01 SURGERY — INSERTION, TUNNELED CENTRAL VENOUS DEVICE, WITH PORT
Anesthesia: General | Site: Chest | Wound class: Clean

## 2016-03-01 MED ORDER — PROPOFOL 500 MG/50ML IV EMUL
INTRAVENOUS | Status: DC | PRN
  Administered 2016-03-01: 60 ug/kg/min via INTRAVENOUS

## 2016-03-01 MED ORDER — LIDOCAINE HCL (PF) 1 % IJ SOLN
INTRAMUSCULAR | Status: AC
  Filled 2016-03-01: qty 30

## 2016-03-01 MED ORDER — OXYCODONE HCL 5 MG PO TABS
ORAL_TABLET | ORAL | Status: AC
  Filled 2016-03-01: qty 1

## 2016-03-01 MED ORDER — FAMOTIDINE 20 MG PO TABS
ORAL_TABLET | ORAL | Status: DC
Start: 2016-03-01 — End: 2016-03-01
  Filled 2016-03-01: qty 1

## 2016-03-01 MED ORDER — SODIUM CHLORIDE 0.9 % IJ SOLN
INTRAMUSCULAR | Status: DC | PRN
  Administered 2016-03-01: 20 mL

## 2016-03-01 MED ORDER — CEFAZOLIN SODIUM-DEXTROSE 2-4 GM/100ML-% IV SOLN
INTRAVENOUS | Status: AC
  Filled 2016-03-01: qty 100

## 2016-03-01 MED ORDER — HYDROCODONE-ACETAMINOPHEN 5-325 MG PO TABS: 1.0000 | ORAL_TABLET | ORAL | Status: DC | PRN

## 2016-03-01 MED ORDER — LACTATED RINGERS IV SOLN
INTRAVENOUS | Status: DC
  Administered 2016-03-01 (×2): via INTRAVENOUS

## 2016-03-01 MED ORDER — OXYCODONE HCL 5 MG/5ML PO SOLN: 5.0000 mg | Freq: Once | ORAL | Status: AC | PRN

## 2016-03-01 MED ORDER — CEFAZOLIN SODIUM-DEXTROSE 2-4 GM/100ML-% IV SOLN
2.0000 g | INTRAVENOUS | Status: AC
  Administered 2016-03-01: 2 g via INTRAVENOUS

## 2016-03-01 MED ORDER — FAMOTIDINE 20 MG PO TABS
20.0000 mg | ORAL_TABLET | Freq: Once | ORAL | Status: AC
  Administered 2016-03-01: 20 mg via ORAL

## 2016-03-01 MED ORDER — FENTANYL CITRATE (PF) 100 MCG/2ML IJ SOLN
INTRAMUSCULAR | Status: DC | PRN
  Administered 2016-03-01 (×2): 50 ug via INTRAVENOUS

## 2016-03-01 MED ORDER — LIDOCAINE HCL (PF) 1 % IJ SOLN
INTRAMUSCULAR | Status: DC | PRN
  Administered 2016-03-01: 15 mL

## 2016-03-01 MED ORDER — SODIUM CHLORIDE 0.9 % IJ SOLN
INTRAMUSCULAR | Status: AC
  Filled 2016-03-01: qty 50

## 2016-03-01 MED ORDER — OXYCODONE HCL 5 MG PO TABS
5.0000 mg | ORAL_TABLET | Freq: Once | ORAL | Status: AC | PRN
  Administered 2016-03-01: 5 mg via ORAL

## 2016-03-01 MED ORDER — FENTANYL CITRATE (PF) 100 MCG/2ML IJ SOLN: 25.0000 ug | INTRAMUSCULAR | Status: DC | PRN

## 2016-03-01 MED ORDER — MIDAZOLAM HCL 2 MG/2ML IJ SOLN
INTRAMUSCULAR | Status: DC | PRN
  Administered 2016-03-01: 2 mg via INTRAVENOUS

## 2016-03-01 SURGICAL SUPPLY — 28 items
BLADE SURG 15 STRL SS SAFETY (BLADE) ×3 IMPLANT
CHLORAPREP W/TINT 26ML (MISCELLANEOUS) ×3 IMPLANT
CLOSURE WOUND 1/2 X4 (GAUZE/BANDAGES/DRESSINGS) ×1
COVER LIGHT HANDLE STERIS (MISCELLANEOUS) ×6 IMPLANT
DECANTER SPIKE VIAL GLASS SM (MISCELLANEOUS) ×6 IMPLANT
DRAPE C-ARM XRAY 36X54 (DRAPES) ×3 IMPLANT
DRAPE LAPAROTOMY TRNSV 106X77 (MISCELLANEOUS) ×3 IMPLANT
DRESSING TELFA 4X3 1S ST N-ADH (GAUZE/BANDAGES/DRESSINGS) ×3 IMPLANT
DRSG TEGADERM 2-3/8X2-3/4 SM (GAUZE/BANDAGES/DRESSINGS) ×3 IMPLANT
DRSG TEGADERM 4X4.75 (GAUZE/BANDAGES/DRESSINGS) ×3 IMPLANT
ELECT REM PT RETURN 9FT ADLT (ELECTROSURGICAL) ×3
ELECTRODE REM PT RTRN 9FT ADLT (ELECTROSURGICAL) ×1 IMPLANT
GLOVE BIO SURGEON STRL SZ7.5 (GLOVE) ×12 IMPLANT
GLOVE INDICATOR 8.0 STRL GRN (GLOVE) ×9 IMPLANT
GOWN STRL REUS W/ TWL LRG LVL3 (GOWN DISPOSABLE) ×3 IMPLANT
GOWN STRL REUS W/TWL LRG LVL3 (GOWN DISPOSABLE) ×6
KIT PORT POWER 8FR ISP CVUE (Catheter) ×3 IMPLANT
KIT RM TURNOVER STRD PROC AR (KITS) ×3 IMPLANT
LABEL OR SOLS (LABEL) ×3 IMPLANT
NS IRRIG 500ML POUR BTL (IV SOLUTION) ×3 IMPLANT
PACK PORT-A-CATH (MISCELLANEOUS) ×3 IMPLANT
STRIP CLOSURE SKIN 1/2X4 (GAUZE/BANDAGES/DRESSINGS) ×2 IMPLANT
SUT PROLENE 3 0 SH DA (SUTURE) ×3 IMPLANT
SUT VIC AB 3-0 SH 27 (SUTURE) ×2
SUT VIC AB 3-0 SH 27X BRD (SUTURE) ×1 IMPLANT
SUT VIC AB 4-0 FS2 27 (SUTURE) ×3 IMPLANT
SWABSTK COMLB BENZOIN TINCTURE (MISCELLANEOUS) ×3 IMPLANT
SYRINGE 10CC LL (SYRINGE) ×3 IMPLANT

## 2016-03-01 NOTE — Op Note (Signed)
Preoperative diagnosis: Metastatic endometrial cancer. Need for central venous access.  Postoperative diagnosis: Same.  Operative procedure: Right subclavian PowerPort placement with ultrasound and fluoroscopic guidance.  Operating surgeon: Ollen Bowl, M.D.  Anesthesia: Attended local, 15 mL 1% plain Xylocaine.  Assessment blood loss: Less than 5 mL.  Clinical note: This 70 year old woman has developed recurrent endometrial cancer 5 years after initial treatment. Central venous access has been requested by the treating oncologist.  Operative note: The patient received Kefzol prior to procedure. The right chest was prepped with ChloraPrep and draped. An Trendelenburg positio right subclavian vein was cannulated under ultrasound guidance. The guidewire was placed and fluoroscopy showed that this had gone into the internal jugular vein. Under fluoroscopic guidance the catheter was guided into the SVC. The dilators place followed by the catheter and this was positioned at the junction of the SVC and right atrium. The port was tunneled to a pocket on the right anterior chest. The port was anchored the tissue with interrupted 3-0 Prolene sutures. The adipose layer closed with a running 3-0 Vicryl suture and skin closed with a running 4-0 Vicryl septic suture. Benzoin, Steri-Strips, Telfa and Tegaderm dressings applied.  The patient tolerated the procedure well and was taken to the recovery room where chest x-ray is pending at time of dictation.

## 2016-03-01 NOTE — H&P (Signed)
[name] [id number] 25-Jan-1946     HPI: 70 y/o female with recurrent endometrial cancer. Candidate for chemotherapy. Central venous access requested by medical oncology.  Patient report no change in symptoms since last exam.   Prescriptions prior to admission  Medication Sig Dispense Refill Last Dose  . ALPRAZolam (XANAX) 0.25 MG tablet Take 0.25 mg by mouth at bedtime as needed for anxiety.    03/01/2016 at 0700  . sertraline (ZOLOFT) 50 MG tablet Take 50 mg by mouth every morning.    02/29/2016 at am  . ondansetron (ZOFRAN) 8 MG tablet Take 1 tablet (8 mg total) by mouth 2 (two) times daily as needed for refractory nausea / vomiting. Start on day 3 after chemo. 30 tablet 1 "last time I was here"   No Known Allergies Past Medical History  Diagnosis Date  . Diverticulitis of colon (without mention of hemorrhage)   . Mechanical complication of colostomy and enterostomy   . Cancer [facility]) 2011    endometrial  . Personal history of tobacco use, presenting hazards to health   . History of kidney stones 2011  . Obesity, unspecified   . Chronic kidney disease     STONES   Past Surgical History  Procedure Laterality Date  . Appendectomy    . Lithotripsy  2009  . Tubal ligation    . Pyelolithotomy    . Colostomy  2011  . Abdominal hysterectomy  2011  . Dilation and curettage of uterus    . Mole removal  2003  . Hysteroscopy  2011  . Colonoscopy  June 2010,03/31/14    Tubular adenoma of the transverse colon, hyperplastic polyps of the sigmoid.  . 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 Colon surgery  05/20/1999    Drainage of pelvic abscess, end colostomy  . Colon surgery  11/15/2010    Revision of colostomy stoma  . Skin cancer removal  02/2014  . Hernia repair  04/06/14    ventral hernia, tetro rectus Ventralex ST mesh, 20 x 33 cm.   . Cholecystectomy  05/07/14    Incidental during ventral hernia  repair.    Social History   Social History  . Marital Status: Married    Spouse Name: N/A  . Number of Children: N/A  . Years of Education: N/A   Occupational History  . Not on file.   Social History Main Topics  . Smoking status: Current Every Day Smoker -- 0.25 packs/day for 15 years    Types: Cigarettes  . Smokeless tobacco: Never Used  . Alcohol Use: No  . Drug Use: No  . Sexual Activity: Not on file   Other Topics Concern  . Not on file   Social History Narrative   Social History   Social History Narrative     ROS: Negative.     PE: HEENT: Negative. Lungs: Clear. Cardio: RR. [name] 03/01/2016   Assessment/Plan:  Proceed with planned right power port placement.

## 2016-03-01 NOTE — Transfer of Care (Signed)
Immediate Anesthesia Transfer of Care Note  Patient: [name]  Procedure(s) Performed: Procedure(s): INSERTION PORT-A-CATH (N/A)  Patient Location: PACU  Anesthesia Type:MAC  Level of Consciousness: awake, alert  and oriented  Airway & Oxygen Therapy: Patient Spontanous Breathing  Post-op Assessment: Report given to RN  Post vital signs: Reviewed and stable  Last Vitals:  Filed Vitals:   03/01/16 0755 03/01/16 0932  BP: 159/99   Pulse: 115   Temp: 36.5 C 36.2 C  Resp: 16     Last Pain:  Filed Vitals:   03/01/16 0934  PainSc: 0-No pain         Complications: No apparent anesthesia complications

## 2016-03-01 NOTE — Anesthesia Postprocedure Evaluation (Signed)
Anesthesia Post Note  Patient: [name]  Procedure(s) Performed: Procedure(s) (LRB): INSERTION PORT-A-CATH (N/A)  Patient location during evaluation: PACU Anesthesia Type: General Level of consciousness: awake and alert Pain management: pain level controlled Vital Signs Assessment: post-procedure vital signs reviewed and stable Respiratory status: spontaneous breathing, nonlabored ventilation, respiratory function stable and patient connected to nasal cannula oxygen Cardiovascular status: blood pressure returned to baseline and stable Postop Assessment: no signs of nausea or vomiting Anesthetic complications: no    Last Vitals:  Filed Vitals:   03/01/16 1044 03/01/16 1100  BP: 146/85 139/86  Pulse: 84 84  Temp: 36.5 C   Resp: 16 16    Last Pain:  Filed Vitals:   03/01/16 1112  PainSc: 4                  Broadus John K Piscitello

## 2016-03-01 NOTE — Transfer of Care (Signed)
Immediate Anesthesia Transfer of Care Note  Patient: [name]  Procedure(s) Performed: Procedure(s): INSERTION PORT-A-CATH (N/A)  Patient Location: PACU  Anesthesia Type:MAC  Level of Consciousness: awake, alert  and oriented  Airway & Oxygen Therapy: Patient Spontanous Breathing and Patient connected to nasal cannula oxygen  Post-op Assessment: Report given to RN and Post -op Vital signs reviewed and stable  Post vital signs: Reviewed and stable  Last Vitals:  Filed Vitals:   03/01/16 0755  BP: 159/99  Pulse: 115  Temp: 36.5 C  Resp: 16    Last Pain: There were no vitals filed for this visit.       Complications: No apparent anesthesia complications

## 2016-03-01 NOTE — Discharge Instructions (Addendum)
  AMBULATORY SURGERY  DISCHARGE INSTRUCTIONS   1) The drugs that you were given will stay in your system until tomorrow so for the next 24 hours you should not:  A) Drive an automobile B) Make any legal decisions C) Drink any alcoholic beverage   2) You may resume regular meals tomorrow.  Today it is better to start with liquids and gradually work up to solid foods.  You may eat anything you prefer, but it is better to start with liquids, then soup and crackers, and gradually work up to solid foods.   3) Please notify your doctor immediately if you have any unusual bleeding, trouble breathing, redness and pain at the surgery site, drainage, fever, or pain not relieved by medication.    4) Additional Instructions: TAKE A STOOL SOFTENER TWICE A DAY WHILE TAKING NARCOTIC PAIN MEDICINE TO PREVENT CONSTIPATION   Please contact your physician with any problems or Same Day Surgery at [phone], Monday through Friday 6 am to 4 pm, or [facility] at [facility] Main number at [phone].   

## 2016-03-01 NOTE — Anesthesia Preprocedure Evaluation (Signed)
Anesthesia Evaluation  Patient identified by MRN, date of birth, ID band Patient awake    Reviewed: Allergy & Precautions, H&P , NPO status , Patient's Chart, lab work & pertinent test results  History of Anesthesia Complications Negative for: history of anesthetic complications  Airway Mallampati: III  TM Distance: <3 FB Neck ROM: limited    Dental  (+) Poor Dentition, Chipped, Missing, Partial Upper   Pulmonary neg shortness of breath, COPD, Current Smoker,    Pulmonary exam normal breath sounds clear to auscultation       Cardiovascular Exercise Tolerance: Good (-) angina(-) Past MI and (-) DOE negative cardio ROS Normal cardiovascular exam Rhythm:regular Rate:Normal     Neuro/Psych negative neurological ROS  negative psych ROS   GI/Hepatic negative GI ROS, Neg liver ROS, neg GERD  ,  Endo/Other  negative endocrine ROS  Renal/GU Renal disease  negative genitourinary   Musculoskeletal   Abdominal   Peds  Hematology negative hematology ROS (+)   Anesthesia Other Findings Past Medical History:   Diverticulitis of colon (without mention of he*              Mechanical complication of colostomy and enter*              Cancer ([place])                                    2011           Comment:endometrial   Personal history of tobacco use, presenting ha*              History of kidney stones                        2011         Obesity, unspecified                                         Chronic kidney disease                                         Comment:STONES  Past Surgical History:   APPENDECTOMY                                                  LITHOTRIPSY                                      2009         TUBAL LIGATION                                                PYELOLITHOTOMY  COLOSTOMY                                        2011         ABDOMINAL HYSTERECTOMY                            2011         DILATION AND CURETTAGE OF UTERUS                              MOLE REMOVAL                                     2003         HYSTEROSCOPY                                     2011         COLONOSCOPY                                      June 2010*     Comment:Tubular adenoma of the transverse colon,               hyperplastic polyps of the sigmoid.   COLON SURGERY                                    05/10/2010     Comment:Sigmoid colectomy with takedown of colovaginal               fistula, drainage pelvic side wall seroma   COLON SURGERY                                    05/20/1999     Comment:Drainage of pelvic abscess, end colostomy   COLON SURGERY                                    11/15/2010     Comment:Revision of colostomy stoma   skin cancer removal                              02/2014       HERNIA REPAIR                                    04/06/14         Comment:ventral hernia, tetro rectus Ventralex ST mesh,              20 x 33 cm.    CHOLECYSTECTOMY                                  05/07/14  Comment:Incidental during ventral hernia repair.   BMI    Body Mass Index   28.70 kg/m 2      Reproductive/Obstetrics negative OB ROS                             Anesthesia Physical Anesthesia Plan  ASA: III  Anesthesia Plan: General   Post-op Pain Management:    Induction:   Airway Management Planned:   Additional Equipment:   Intra-op Plan:   Post-operative Plan:   Informed Consent: I have reviewed the patients History and Physical, chart, labs and discussed the procedure including the risks, benefits and alternatives for the proposed anesthesia with the patient or authorized representative who has indicated his/her understanding and acceptance.   Dental Advisory Given  Plan Discussed with: Anesthesiologist, CRNA and Surgeon  Anesthesia Plan Comments:         Anesthesia Quick Evaluation

## 2016-03-05 ENCOUNTER — Ambulatory Visit: Payer: Medicare Other

## 2016-03-05 ENCOUNTER — Ambulatory Visit: Payer: Medicare Other | Admitting: Hematology and Oncology

## 2016-03-05 ENCOUNTER — Other Ambulatory Visit: Payer: Medicare Other

## 2016-03-06 ENCOUNTER — Other Ambulatory Visit: Payer: Self-pay | Admitting: Hematology and Oncology

## 2016-03-07 ENCOUNTER — Encounter: Payer: Self-pay | Admitting: Hematology and Oncology

## 2016-03-07 ENCOUNTER — Inpatient Hospital Stay: Payer: Medicare Other | Attending: Oncology

## 2016-03-07 ENCOUNTER — Ambulatory Visit: Payer: Medicare Other

## 2016-03-07 ENCOUNTER — Inpatient Hospital Stay (HOSPITAL_BASED_OUTPATIENT_CLINIC_OR_DEPARTMENT_OTHER): Payer: Medicare Other | Admitting: Hematology and Oncology

## 2016-03-07 VITALS — BP 119/80 | HR 104 | Temp 97.4°F | Wt 155.6 lb

## 2016-03-07 VITALS — BP 119/87 | HR 85 | Temp 97.5°F

## 2016-03-07 DIAGNOSIS — C541 Malignant neoplasm of endometrium: Secondary | ICD-10-CM | POA: Diagnosis not present

## 2016-03-07 DIAGNOSIS — F17210 Nicotine dependence, cigarettes, uncomplicated: Secondary | ICD-10-CM

## 2016-03-07 DIAGNOSIS — R634 Abnormal weight loss: Secondary | ICD-10-CM

## 2016-03-07 DIAGNOSIS — N189 Chronic kidney disease, unspecified: Secondary | ICD-10-CM | POA: Insufficient documentation

## 2016-03-07 DIAGNOSIS — Z87442 Personal history of urinary calculi: Secondary | ICD-10-CM | POA: Insufficient documentation

## 2016-03-07 DIAGNOSIS — C779 Secondary and unspecified malignant neoplasm of lymph node, unspecified: Secondary | ICD-10-CM | POA: Diagnosis not present

## 2016-03-07 DIAGNOSIS — Z79899 Other long term (current) drug therapy: Secondary | ICD-10-CM

## 2016-03-07 DIAGNOSIS — E669 Obesity, unspecified: Secondary | ICD-10-CM | POA: Diagnosis not present

## 2016-03-07 DIAGNOSIS — K5660 Unspecified intestinal obstruction: Secondary | ICD-10-CM | POA: Diagnosis not present

## 2016-03-07 DIAGNOSIS — Z5111 Encounter for antineoplastic chemotherapy: Secondary | ICD-10-CM | POA: Insufficient documentation

## 2016-03-07 DIAGNOSIS — R197 Diarrhea, unspecified: Secondary | ICD-10-CM

## 2016-03-07 DIAGNOSIS — K432 Incisional hernia without obstruction or gangrene: Secondary | ICD-10-CM | POA: Insufficient documentation

## 2016-03-07 LAB — CBC WITH DIFFERENTIAL/PLATELET
Basophils Absolute: 0.1 10*3/uL (ref 0–0.1)
Basophils Relative: 1 %
Eosinophils Absolute: 0.1 10*3/uL (ref 0–0.7)
Eosinophils Relative: 1 %
HCT: 40.9 % (ref 35.0–47.0)
Hemoglobin: 13.5 g/dL (ref 12.0–16.0)
Lymphocytes Relative: 14 %
Lymphs Abs: 1.6 10*3/uL (ref 1.0–3.6)
MCH: 32 pg (ref 26.0–34.0)
MCHC: 33 g/dL (ref 32.0–36.0)
MCV: 96.9 fL (ref 80.0–100.0)
Monocytes Absolute: 0.8 10*3/uL (ref 0.2–0.9)
Monocytes Relative: 7 %
Neutro Abs: 9 10*3/uL — ABNORMAL HIGH (ref 1.4–6.5)
Neutrophils Relative %: 77 %
Platelets: 284 10*3/uL (ref 150–440)
RBC: 4.22 MIL/uL (ref 3.80–5.20)
RDW: 13.9 % (ref 11.5–14.5)
WBC: 11.6 10*3/uL — ABNORMAL HIGH (ref 3.6–11.0)

## 2016-03-07 LAB — COMPREHENSIVE METABOLIC PANEL
ALT: 13 U/L — ABNORMAL LOW (ref 14–54)
AST: 20 U/L (ref 15–41)
Albumin: 2.9 g/dL — ABNORMAL LOW (ref 3.5–5.0)
Alkaline Phosphatase: 126 U/L (ref 38–126)
Anion gap: 7 (ref 5–15)
BUN: 27 mg/dL — ABNORMAL HIGH (ref 6–20)
CO2: 27 mmol/L (ref 22–32)
Calcium: 8.4 mg/dL — ABNORMAL LOW (ref 8.9–10.3)
Chloride: 105 mmol/L (ref 101–111)
Creatinine, Ser: 1.5 mg/dL — ABNORMAL HIGH (ref 0.44–1.00)
GFR calc Af Amer: 40 mL/min — ABNORMAL LOW (ref >60)
GFR calc non Af Amer: 34 mL/min — ABNORMAL LOW (ref >60)
Glucose, Bld: 133 mg/dL — ABNORMAL HIGH (ref 65–99)
Potassium: 3.7 mmol/L (ref 3.5–5.1)
Sodium: 139 mmol/L (ref 135–145)
Total Bilirubin: 0.5 mg/dL (ref 0.3–1.2)
Total Protein: 6.4 g/dL — ABNORMAL LOW (ref 6.5–8.1)

## 2016-03-07 LAB — MAGNESIUM: Magnesium: 1.4 mg/dL — ABNORMAL LOW (ref 1.7–2.4)

## 2016-03-07 MED ORDER — MAGNESIUM SULFATE 2 GM/50ML IV SOLN
2.0000 g | Freq: Once | INTRAVENOUS | Status: AC
  Administered 2016-03-07: 1 g via INTRAVENOUS

## 2016-03-07 MED ORDER — SODIUM CHLORIDE 0.9 % IV SOLN
175.0000 mg/m2 | Freq: Once | INTRAVENOUS | Status: AC
  Administered 2016-03-07: 312 mg via INTRAVENOUS
  Filled 2016-03-07: qty 52

## 2016-03-07 MED ORDER — PALONOSETRON HCL INJECTION 0.25 MG/5ML
0.2500 mg | Freq: Once | INTRAVENOUS | Status: AC
  Administered 2016-03-07: 0.25 mg via INTRAVENOUS
  Filled 2016-03-07: qty 5

## 2016-03-07 MED ORDER — FAMOTIDINE IN NACL 20-0.9 MG/50ML-% IV SOLN
20.0000 mg | Freq: Once | INTRAVENOUS | Status: AC
  Administered 2016-03-07: 20 mg via INTRAVENOUS
  Filled 2016-03-07: qty 50

## 2016-03-07 MED ORDER — SODIUM CHLORIDE 0.9 % IV SOLN
Freq: Once | INTRAVENOUS | Status: AC
  Administered 2016-03-07: 10:00:00 via INTRAVENOUS
  Filled 2016-03-07: qty 1000

## 2016-03-07 MED ORDER — HEPARIN SOD (PORK) LOCK FLUSH 100 UNIT/ML IV SOLN
500.0000 [IU] | Freq: Once | INTRAVENOUS | Status: AC | PRN
  Administered 2016-03-07: 500 [IU]
  Filled 2016-03-07: qty 5

## 2016-03-07 MED ORDER — DIPHENHYDRAMINE HCL 50 MG/ML IJ SOLN
50.0000 mg | Freq: Once | INTRAMUSCULAR | Status: AC
  Administered 2016-03-07: 50 mg via INTRAVENOUS
  Filled 2016-03-07: qty 1

## 2016-03-07 MED ORDER — SODIUM CHLORIDE 0.9 % IV SOLN
20.0000 mg | Freq: Once | INTRAVENOUS | Status: AC
  Administered 2016-03-07: 20 mg via INTRAVENOUS
  Filled 2016-03-07: qty 2

## 2016-03-07 MED ORDER — SODIUM CHLORIDE 0.9 % IV SOLN
290.0000 mg | Freq: Once | INTRAVENOUS | Status: AC
  Administered 2016-03-07: 290 mg via INTRAVENOUS
  Filled 2016-03-07: qty 29

## 2016-03-07 NOTE — Progress Notes (Signed)
Patient ambulates without assistance, brought to exam room 3 accompanied by her husband. Patient c/o diarrhea, but denies blood in stool.   Patient denies pain but has chronic abdominal discomfort.  Patient states she does not have the best appetite.  BP 119/80 HR 104 vitals documented.  Medication record updated information provided by patient.  Dr notified.

## 2016-03-07 NOTE — Progress Notes (Signed)
[facility] day:  03/07/2016   Chief Complaint: [name] is a 70 y.o. female with a history of stage III endometrial cancer who is seen for assessment prior to cycle #1 carboplatin and Taxol.   HPI:  The patient was last seen by me in the medical oncology clinic on 02/27/2016.  At that time, she had crampy abdominal discomfort.  She has a month history of watery diarrhea.  PET scan revealed progressive disease.  CA125 was 102.5.  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  We discussed systemic chemotherapy.  She had her port-a-cath placed on 03/01/2016 by Dr. Bary Castilla.  During the interim, she denies any new complaints.  She has ongoing diarrhea and abdominal discomfort.  She stats that she can't eat much.  She had some nausea and vomiting yesterday.  Ondansetron helps.   Past Medical History  Diagnosis Date  . Diverticulitis of colon (without mention of hemorrhage)   . Mechanical complication of colostomy and enterostomy   . Cancer [facility]) 2011    endometrial  . Personal history of tobacco use, presenting hazards to health   . History of kidney stones 2011  . Obesity, unspecified   . Chronic kidney disease     STONES    Past Surgical History  Procedure Laterality Date  . Appendectomy    . Lithotripsy  2009  . Tubal ligation    . Pyelolithotomy    . Colostomy  2011  . Abdominal hysterectomy  2011  . Dilation and curettage of uterus    . Mole removal  2003  . Hysteroscopy  2011  . Colonoscopy  June 2010,03/31/14    Tubular adenoma of the transverse colon, hyperplastic polyps of the sigmoid.  . 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 Colon surgery  05/20/1999    Drainage of pelvic abscess, end colostomy  . Colon surgery  11/15/2010    Revision of colostomy stoma  . Skin  cancer removal  02/2014  . Hernia repair  04/06/14    ventral hernia, tetro rectus Ventralex ST mesh, 20 x 33 cm.   . Cholecystectomy  05/07/14    Incidental during ventral hernia repair.   . Portacath placement N/A 03/01/2016    Procedure: INSERTION PORT-A-CATH;  Surgeon: [name], MD;  Location: [facility] ORS;  Service: General;  Laterality: N/A;    Family History  Problem Relation Age of Onset  . Breast cancer Maternal Grandmother   . Colon cancer Mother   . Cancer Mother     Social History:  reports that she has been smoking Cigarettes.  She has a 3.75 pack-year smoking history. She has never used smokeless tobacco. She reports that she does not drink alcohol or use illicit drugs.  She smokes 7 cigarettes a day plus vapor cigarettes.  She doesn't want to quit smoking.  She is a retired Marine scientist.  The patient is accompaied by her husband,  [name], today.  Allergies: No Known Allergies  Current Medications: Current Outpatient Prescriptions  Medication Sig Dispense Refill  . ALPRAZolam (XANAX) 0.25 MG tablet Take 0.25 mg by mouth at bedtime as needed for anxiety.     . ondansetron (ZOFRAN) 8 MG tablet Take 1 tablet (8 mg total) by mouth 2 (two) times daily as needed for refractory nausea / vomiting. Start on day 3  after chemo. 30 tablet 1  . sertraline (ZOLOFT) 50 MG tablet Take 50 mg by mouth every morning.     Marland Kitchen HYDROcodone-acetaminophen (NORCO) 5-325 MG tablet Take 1-2 tablets by mouth every 4 (four) hours as needed for moderate pain. (Patient not taking: Reported on 03/07/2016) 30 tablet 0   No current facility-administered medications for this visit.    Review of Systems:  GENERAL:  Fatigue.  No fevers or sweats.  Weight loss of 9 pounds. PERFORMANCE STATUS (ECOG):  1 HEENT:  No visual changes, runny nose, sore throat, mouth sores or tenderness. Lungs: No shortness of breath or cough.  No hemoptysis. Cardiac:  No chest pain, palpitations, orthopnea, or PND. GI:  Abdominal  discomfort and cramps.  Watery diarrhea, no change.  No nausea, vomiting, constipation, melena or hematochezia. GU:  No urgency, frequency, dysuria, or hematuria. Musculoskeletal:  No back pain.  No joint pain.  No muscle tenderness. Extremities:  No pain or swelling. Skin:  No rashes or skin changes. Neuro:  No headache, numbness or weakness, balance or coordination issues. Endocrine:  No diabetes, thyroid issues, hot flashes or night sweats. Psych:  Poor sleep.  No mood changes, depression or anxiety. Pain: Abdominal discomfort/pain. Review of systems:  All other systems reviewed and found to be negative.  Physical Exam: Blood pressure 119/80, pulse 104, temperature 97.4 F (36.3 C), temperature source Tympanic, weight 155 lb 10.3 oz (70.6 kg). GENERAL:  Well developed, well nourished, woman sitting comfortably in the exam room in no acute distress. MENTAL STATUS:  Alert and oriented to person, place and time. HEAD:  Short gray hair.  Normocephalic, atraumatic, face symmetric, no Cushingoid features. EYES:  Blue eyes.  Pupils equal round and reactive to light and accomodation.  No conjunctivitis or scleral icterus. ENT:  Oropharynx clear without lesion.  Tongue normal. Mucous membranes moist.  RESPIRATORY:  Clear to auscultation without rales, wheezes or rhonchi. CARDIOVASCULAR:  Regular rate and rhythm without murmur, rub or gallop. ABDOMEN:  Colostomy.  Soft, non-tender, with active bowel sounds, and no hepatosplenomegaly.  No masses. SKIN:  No rashes, ulcers or lesions. EXTREMITIES: No edema, no skin discoloration or tenderness.  No palpable cords. LYMPH NODES: No palpable cervical, supraclavicular, axillary or inguinal adenopathy  NEUROLOGICAL: Unremarkable. PSYCH:  Appropriate.  No visits with results within 3 Day(s) from this visit. Latest known visit with results is:  Hospital Outpatient Visit on 02/21/2016  Component Date Value Ref Range Status  . aPTT 02/21/2016 34  24 -  36 seconds Final  . Prothrombin Time 02/21/2016 14.1  11.4 - 15.0 seconds Final  . INR 02/21/2016 1.07   Final  . SURGICAL PATHOLOGY 02/21/2016    Final-Edited                   Value:Surgical Pathology THIS IS AN ADDENDUM REPORT CASE: [id number] PATIENT: [facility] Surgical Pathology Report Addendum  Reason for Addendum #1:  Immunohistochemistry results  SPECIMEN SUBMITTED: A. Pelvic mass, right B. Pelvic mass, right  CLINICAL HISTORY: T3N2M0 (stage III c2) endometrial carcinoma, grade 3 in 2011  PRE-OPERATIVE DIAGNOSIS: Probable recurrent endometrial carcinoma involving 4-5 cm right pelvic sidewall mass and high left aortic node  POST-OPERATIVE DIAGNOSIS: Same as pre op     DIAGNOSIS: A. PELVIC MASS, RIGHT; BIOPSY: - HIGH GRADE METASTATIC ADENOCARCINOMA, MORPHOLOGICALLY CONSISTENT WITH PATIENT'S KNOWN HISTORY OF ENDOMETRIAL CARCINOMA.  B. PELVIC MASS, RIGHT; CT-GUIDED BIOPSY: - HIGH GRADE METASTATIC ADENOCARCINOMA, MORPHOLOGICALLY CONSISTENT WITH PATIENT'S KNOWN HISTORY OF ENDOMETRIAL CARCINOMA.  Comment: The patient has a history of poorly differentiated endometrial adenocarcinoma (FIGO                          3)  diagnosed in June 2011. A limited panel of immunohistochemical stains was performed to sub classify the type of endometrial carcinoma. Stain results: p53: positive (focal strong subset in a background of wild type pattern)  ER: positive (diffuse) p 16: positive (focal strong subset) Napsin: negative Stain controls worked appropriately. This pattern of immunoreactivity is consistent with high grade endometrial adenocarcinoma, endometrioid type with focal aberrant expression of p53 and p16.  The staining pattern is not consistent with serous carcinoma. Preliminary findings were communicated to Dr. Rogue Bussing on 02/22/2016.  GROSS DESCRIPTION:  A. Labeled: right pelvic mass  Tissue fragment(s): 2  Size: cores of pink-tan tissue, and  aggregate 2.0 x 0.1 x 0.1 cm  Description: cores received in saline, touch prep prepared with Diff Quik stain slide, following staining instructed by Dr. Reuel Derby entirely formalin fixed, wrapped in lens paper, marked blue and submit                         ted in a mesh bag  Entirely submitted in one cassette(s).   B. Labeled: right pelvic mass  Tissue fragment(s): 3  Size: cores of pink-tan tissue 0.3-2 cm in length and in diameter 0.1 cm  Description: pink-tan course, wrapped in lens paper, marked blue and submitted in a mesh bag  Entirely submitted in 1 cassette(s).     Final Diagnosis performed by Quay Burow, MD.  Electronically signed 02/24/2016 1:43:43PM   The electronic signature indicates that the named Attending Pathologist has evaluated the specimen  Technical component performed at [facility], [address], [place], [place] [address] Lab: [phone] Dir: Darrick Penna. Evette Doffing, MD  Professional component performed at [facility], [facility], [place], [place], [place] [address] Lab: [phone] Dir: Dellia Nims. Reuel Derby, MD   ADDENDUM: Immunohistochemistry (IHC) Testing for Mismatch Repair (MMR) Proteins:  Results:  MLH1: Intact nuclear expression MSH2: Intact nuclear exp                         ression MSH6: Intact nuclear expression PMS2: Intact nuclear expression  IHC Interpretation: No loss of nuclear expression of MMR proteins: Low probability of MSI-H.  Testing was performed on block: B1  IHC slides were prepared by [facility], [place], [place], and interpreted by Dr. Dicie Beam. All controls stained appropriately.    Addendum #1 performed by Bryan Lemma, MD.  Electronically signed 02/29/2016 5:34:54PM    Technical component performed at [facility], [address], [place], [place] [address] Lab: [phone] Dir: Darrick Penna. Evette Doffing, MD  Professional component performed at [facility], [facility], [place], [place], [place] [address] Lab: [phone] Dir: Dellia Nims. Reuel Derby, MD      Assessment:  AMBREEN TUFTE is a 70 y.o. female with a history of stage III endometrial carcinoma stats post TAH/BSO in 02/2010.  Pathology revealed grade III endometrial carcinoma.  Tumor was 3 x 3 cm and extended through the thickness of the myometrium.  There were 2 of 6 right pelvic lymph nodes and 2 of 6 periaortic nodes involved with metastatic disease. Peritoneal washings were negative.  Pathologic stage was T3N2M0 (stage III c2).  She received 6 cycles of carboplatin and Taxol from 03/27/2010  until 08/23/2010.  She received brachytherapy (completed 11/2010).  She developed a grade I/II neuropathy.  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 Following re-anastomosis, she developed a leak and underwent diverting colostomy.  She has had 3 incisional hernias.  PET scan on 02/16/2016 revealed a 4.4 x 5.0 cm hypermetabolic soft tissue mass along the right pelvic sidewall, presumably malignant metastatic lymphadenopathy.  There was also metastatic lymphadenopathy in the left para-aortic nodal station adjacent to the left renal hilum, and in the right retrocrural nodal station.  There was a metastatic lesion in the right ilium.  There was evidence of persistent partial small bowel obstruction.  CA125 results are as follows: 19 on 01/19/2014, 23.6 on 07/23/2014, 40.5 on 01/26/2015, 102.5 on 02/08/2016, and 82.7 on 03/07/2016.  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  MSI testing is pending.   She has renal insufficiency.  CrCl is 32.6 ml/min.  Symptomatically, she has crampy abdominal discomfort.  She has chronic diarrhea.  Exam is stable.  Weight is down.  She has hypomagnesemia.  Plan: 1.  Labs today:  CBC with diff, CMP, Mg, CA125. 2.  Review chemotherapy plans.   No plans for Neulasta with cycle #1.  Monitor counts closely.  Patient consented to treatment.. 3.  Cycle #1 carboplatin and Taxol. 4.  Discuss importance of hydration.  Discuss caloric intake.  Discuss management of diarrhea. 5.  RTC on 03/16/2016 for labs (CBC with diff, BMP, Mg). 6.  RTC in 3 weeks for MD assessment, labs (CBC with diff, CMP, Mg, CA125), and cycle #2 carboplatin and Taxol.   Lequita Asal, MD  03/07/2016, 9:33 AM

## 2016-03-08 LAB — CA 125: CA 125: 82.7 U/mL — ABNORMAL HIGH (ref 0.0–38.1)

## 2016-03-16 ENCOUNTER — Other Ambulatory Visit: Payer: Self-pay | Admitting: Hematology and Oncology

## 2016-03-16 ENCOUNTER — Telehealth: Payer: Self-pay | Admitting: *Deleted

## 2016-03-16 ENCOUNTER — Inpatient Hospital Stay: Payer: Medicare Other

## 2016-03-16 DIAGNOSIS — R634 Abnormal weight loss: Secondary | ICD-10-CM

## 2016-03-16 DIAGNOSIS — E876 Hypokalemia: Secondary | ICD-10-CM

## 2016-03-16 DIAGNOSIS — R110 Nausea: Secondary | ICD-10-CM

## 2016-03-16 DIAGNOSIS — R197 Diarrhea, unspecified: Secondary | ICD-10-CM

## 2016-03-16 DIAGNOSIS — C541 Malignant neoplasm of endometrium: Secondary | ICD-10-CM

## 2016-03-16 LAB — CBC WITH DIFFERENTIAL/PLATELET
Basophils Absolute: 0 10*3/uL (ref 0–0.1)
Basophils Relative: 1 %
Eosinophils Absolute: 0 10*3/uL (ref 0–0.7)
Eosinophils Relative: 0 %
HCT: 40.9 % (ref 35.0–47.0)
Hemoglobin: 13.8 g/dL (ref 12.0–16.0)
Lymphocytes Relative: 34 %
Lymphs Abs: 1.2 10*3/uL (ref 1.0–3.6)
MCH: 32 pg (ref 26.0–34.0)
MCHC: 33.8 g/dL (ref 32.0–36.0)
MCV: 94.8 fL (ref 80.0–100.0)
Monocytes Absolute: 0.8 10*3/uL (ref 0.2–0.9)
Monocytes Relative: 23 %
Neutro Abs: 1.5 10*3/uL (ref 1.4–6.5)
Neutrophils Relative %: 42 %
Platelets: 254 10*3/uL (ref 150–440)
RBC: 4.31 MIL/uL (ref 3.80–5.20)
RDW: 13.2 % (ref 11.5–14.5)
WBC: 3.5 10*3/uL — ABNORMAL LOW (ref 3.6–11.0)

## 2016-03-16 LAB — BASIC METABOLIC PANEL
Anion gap: 14 (ref 5–15)
BUN: 61 mg/dL — ABNORMAL HIGH (ref 6–20)
CO2: 22 mmol/L (ref 22–32)
Calcium: 6.9 mg/dL — ABNORMAL LOW (ref 8.9–10.3)
Chloride: 99 mmol/L — ABNORMAL LOW (ref 101–111)
Creatinine, Ser: 1.98 mg/dL — ABNORMAL HIGH (ref 0.44–1.00)
GFR calc Af Amer: 28 mL/min — ABNORMAL LOW (ref >60)
GFR calc non Af Amer: 25 mL/min — ABNORMAL LOW (ref >60)
Glucose, Bld: 135 mg/dL — ABNORMAL HIGH (ref 65–99)
Potassium: 3.1 mmol/L — ABNORMAL LOW (ref 3.5–5.1)
Sodium: 135 mmol/L (ref 135–145)

## 2016-03-16 LAB — MAGNESIUM: Magnesium: 0.9 mg/dL — CL (ref 1.7–2.4)

## 2016-03-16 MED ORDER — SODIUM CHLORIDE 0.9 % IV SOLN
Freq: Once | INTRAVENOUS | Status: AC
  Administered 2016-03-16: 12:00:00 via INTRAVENOUS
  Filled 2016-03-16: qty 4

## 2016-03-16 MED ORDER — POTASSIUM CHLORIDE ER 10 MEQ PO TBCR: 10.0000 meq | EXTENDED_RELEASE_TABLET | Freq: Every day | ORAL | Status: DC

## 2016-03-16 MED ORDER — SODIUM CHLORIDE 0.9 % IV SOLN
Freq: Once | INTRAVENOUS | Status: AC
  Administered 2016-03-16: 12:00:00 via INTRAVENOUS
  Filled 2016-03-16: qty 10

## 2016-03-16 MED ORDER — HEPARIN SOD (PORK) LOCK FLUSH 100 UNIT/ML IV SOLN
INTRAVENOUS | Status: AC
  Filled 2016-03-16: qty 5

## 2016-03-16 MED ORDER — HEPARIN SOD (PORK) LOCK FLUSH 100 UNIT/ML IV SOLN
500.0000 [IU] | Freq: Once | INTRAVENOUS | Status: AC
  Administered 2016-03-16: 500 [IU] via INTRAVENOUS

## 2016-03-16 NOTE — Telephone Encounter (Signed)
Called pt after getting call from [facility] in [place] that Loop was low. Then I called Erasmo Downer after finding out if pt could get 2 gm mag at [facility] and she said she would check with Grayland Ormond because he had to leave 12 noon today and then called back to say it would not work.  I then called charge in [place] and they can do it if she can be here 11:30. I called pt and spoke to husband and they will be here.  In the interim the pt's potassium was low and so the infusion will now be mag and potassium. Over 1 hour. When pt checked in I went over to infusion and told pt about potassium and mag. And also to get potassium once a day that I will call in to [place] and a lab only next wed in [place] and they are agreeable.  She was also nauseated and got order for zofran here in infusion area.  I gave her appt 7/19 in [place] for 10:30 and she is agreeable

## 2016-03-16 NOTE — Telephone Encounter (Signed)
Ivin Booty called from [facility] lab with critical results, magnesium 0.9. Sherry notified in [place] of patients critical lab.

## 2016-03-16 NOTE — Telephone Encounter (Signed)
There is telephone note with documentation of pt set up for infusion

## 2016-03-18 ENCOUNTER — Encounter: Payer: Self-pay | Admitting: Emergency Medicine

## 2016-03-18 ENCOUNTER — Inpatient Hospital Stay
Admission: EM | Admit: 2016-03-18 | Discharge: 2016-03-23 | DRG: 683 | Disposition: A | Discharge status: Home / Self Care | Payer: Medicare Other | Attending: Internal Medicine | Admitting: Internal Medicine

## 2016-03-18 ENCOUNTER — Emergency Department: Payer: Medicare Other

## 2016-03-18 DIAGNOSIS — C7951 Secondary malignant neoplasm of bone: Secondary | ICD-10-CM | POA: Diagnosis present

## 2016-03-18 DIAGNOSIS — Z90710 Acquired absence of both cervix and uterus: Secondary | ICD-10-CM | POA: Diagnosis not present

## 2016-03-18 DIAGNOSIS — Z803 Family history of malignant neoplasm of breast: Secondary | ICD-10-CM

## 2016-03-18 DIAGNOSIS — Z933 Colostomy status: Secondary | ICD-10-CM

## 2016-03-18 DIAGNOSIS — C7989 Secondary malignant neoplasm of other specified sites: Secondary | ICD-10-CM | POA: Diagnosis present

## 2016-03-18 DIAGNOSIS — T451X5A Adverse effect of antineoplastic and immunosuppressive drugs, initial encounter: Secondary | ICD-10-CM | POA: Diagnosis present

## 2016-03-18 DIAGNOSIS — I959 Hypotension, unspecified: Secondary | ICD-10-CM | POA: Diagnosis present

## 2016-03-18 DIAGNOSIS — R197 Diarrhea, unspecified: Secondary | ICD-10-CM | POA: Insufficient documentation

## 2016-03-18 DIAGNOSIS — E860 Dehydration: Secondary | ICD-10-CM | POA: Diagnosis not present

## 2016-03-18 DIAGNOSIS — N179 Acute kidney failure, unspecified: Principal | ICD-10-CM | POA: Diagnosis present

## 2016-03-18 DIAGNOSIS — Z8542 Personal history of malignant neoplasm of other parts of uterus: Secondary | ICD-10-CM

## 2016-03-18 DIAGNOSIS — N183 Chronic kidney disease, stage 3 (moderate): Secondary | ICD-10-CM | POA: Diagnosis present

## 2016-03-18 DIAGNOSIS — Z87442 Personal history of urinary calculi: Secondary | ICD-10-CM | POA: Diagnosis not present

## 2016-03-18 DIAGNOSIS — C541 Malignant neoplasm of endometrium: Secondary | ICD-10-CM | POA: Diagnosis not present

## 2016-03-18 DIAGNOSIS — E876 Hypokalemia: Secondary | ICD-10-CM

## 2016-03-18 DIAGNOSIS — F17210 Nicotine dependence, cigarettes, uncomplicated: Secondary | ICD-10-CM | POA: Diagnosis present

## 2016-03-18 DIAGNOSIS — Z85828 Personal history of other malignant neoplasm of skin: Secondary | ICD-10-CM | POA: Diagnosis not present

## 2016-03-18 DIAGNOSIS — Y929 Unspecified place or not applicable: Secondary | ICD-10-CM | POA: Diagnosis not present

## 2016-03-18 LAB — COMPREHENSIVE METABOLIC PANEL
ALT: 17 U/L (ref 14–54)
ANION GAP: 15 (ref 5–15)
AST: 30 U/L (ref 15–41)
Albumin: 2.4 g/dL — ABNORMAL LOW (ref 3.5–5.0)
Alkaline Phosphatase: 105 U/L (ref 38–126)
BUN: 73 mg/dL — ABNORMAL HIGH (ref 6–20)
CHLORIDE: 98 mmol/L — AB (ref 101–111)
CO2: 21 mmol/L — ABNORMAL LOW (ref 22–32)
Calcium: 7.6 mg/dL — ABNORMAL LOW (ref 8.9–10.3)
Creatinine, Ser: 2.29 mg/dL — ABNORMAL HIGH (ref 0.44–1.00)
GFR, EST AFRICAN AMERICAN: 24 mL/min — AB (ref >60)
GFR, EST NON AFRICAN AMERICAN: 21 mL/min — AB (ref >60)
Glucose, Bld: 112 mg/dL — ABNORMAL HIGH (ref 65–99)
POTASSIUM: 2.8 mmol/L — AB (ref 3.5–5.1)
Sodium: 134 mmol/L — ABNORMAL LOW (ref 135–145)
TOTAL PROTEIN: 5.5 g/dL — AB (ref 6.5–8.1)
Total Bilirubin: 0.7 mg/dL (ref 0.3–1.2)

## 2016-03-18 LAB — CBC WITH DIFFERENTIAL/PLATELET
BASOS PCT: 0 %
Basophils Absolute: 0 10*3/uL (ref 0–0.1)
EOS ABS: 0 10*3/uL (ref 0–0.7)
EOS PCT: 0 %
HCT: 38.5 % (ref 35.0–47.0)
HEMOGLOBIN: 13.6 g/dL (ref 12.0–16.0)
LYMPHS ABS: 1.2 10*3/uL (ref 1.0–3.6)
Lymphocytes Relative: 23 %
MCH: 32.8 pg (ref 26.0–34.0)
MCHC: 35.4 g/dL (ref 32.0–36.0)
MCV: 92.6 fL (ref 80.0–100.0)
Monocytes Absolute: 0.9 10*3/uL (ref 0.2–0.9)
Monocytes Relative: 16 %
NEUTROS PCT: 61 %
Neutro Abs: 3.3 10*3/uL (ref 1.4–6.5)
PLATELETS: 253 10*3/uL (ref 150–440)
RBC: 4.16 MIL/uL (ref 3.80–5.20)
RDW: 13.1 % (ref 11.5–14.5)
WBC: 5.5 10*3/uL (ref 3.6–11.0)

## 2016-03-18 LAB — URINALYSIS COMPLETE WITH MICROSCOPIC (ARMC ONLY)
Bilirubin Urine: NEGATIVE
GLUCOSE, UA: NEGATIVE mg/dL
Ketones, ur: NEGATIVE mg/dL
NITRITE: NEGATIVE
PROTEIN: 100 mg/dL — AB
Specific Gravity, Urine: 1.017 (ref 1.005–1.030)
TRANS EPITHEL UA: 1
pH: 5 (ref 5.0–8.0)

## 2016-03-18 LAB — LACTIC ACID, PLASMA
LACTIC ACID, VENOUS: 1.6 mmol/L (ref 0.5–1.9)
Lactic Acid, Venous: 1 mmol/L (ref 0.5–1.9)

## 2016-03-18 LAB — TROPONIN I: TROPONIN I: 0.04 ng/mL — AB (ref <0.03)

## 2016-03-18 LAB — PHOSPHORUS: PHOSPHORUS: 3.9 mg/dL (ref 2.5–4.6)

## 2016-03-18 LAB — MAGNESIUM: MAGNESIUM: 1.5 mg/dL — AB (ref 1.7–2.4)

## 2016-03-18 LAB — POTASSIUM: POTASSIUM: 3.6 mmol/L (ref 3.5–5.1)

## 2016-03-18 MED ORDER — HEPARIN SODIUM (PORCINE) 5000 UNIT/ML IJ SOLN
5000.0000 [IU] | Freq: Three times a day (TID) | INTRAMUSCULAR | Status: DC
  Administered 2016-03-18 – 2016-03-19 (×3): 5000 [IU] via SUBCUTANEOUS
  Filled 2016-03-18 (×3): qty 1

## 2016-03-18 MED ORDER — SERTRALINE HCL 50 MG PO TABS
50.0000 mg | ORAL_TABLET | ORAL | Status: DC
  Administered 2016-03-19 – 2016-03-23 (×5): 50 mg via ORAL
  Filled 2016-03-18 (×5): qty 1

## 2016-03-18 MED ORDER — POTASSIUM CHLORIDE 10 MEQ/100ML IV SOLN
10.0000 meq | INTRAVENOUS | Status: AC
  Administered 2016-03-18: 10 meq via INTRAVENOUS
  Filled 2016-03-18: qty 100

## 2016-03-18 MED ORDER — ONDANSETRON HCL 4 MG PO TABS: 4.0000 mg | ORAL_TABLET | Freq: Four times a day (QID) | ORAL | Status: DC | PRN

## 2016-03-18 MED ORDER — OXYCODONE HCL 5 MG PO TABS
5.0000 mg | ORAL_TABLET | ORAL | Status: DC | PRN
  Administered 2016-03-18: 22:00:00 5 mg via ORAL
  Filled 2016-03-18: qty 1

## 2016-03-18 MED ORDER — MORPHINE SULFATE (PF) 2 MG/ML IV SOLN: 2.0000 mg | INTRAVENOUS | Status: DC | PRN

## 2016-03-18 MED ORDER — ACETAMINOPHEN 325 MG PO TABS: 650.0000 mg | ORAL_TABLET | Freq: Four times a day (QID) | ORAL | Status: DC | PRN

## 2016-03-18 MED ORDER — POTASSIUM CHLORIDE 10 MEQ/100ML IV SOLN
10.0000 meq | INTRAVENOUS | Status: AC
  Administered 2016-03-18 (×5): 10 meq via INTRAVENOUS
  Filled 2016-03-18 (×5): qty 100

## 2016-03-18 MED ORDER — LORAZEPAM 2 MG/ML IJ SOLN
0.5000 mg | Freq: Once | INTRAMUSCULAR | Status: AC
  Administered 2016-03-18: 0.5 mg via INTRAVENOUS
  Filled 2016-03-18: qty 1

## 2016-03-18 MED ORDER — LORAZEPAM 2 MG/ML IJ SOLN
1.0000 mg | INTRAMUSCULAR | Status: DC | PRN
  Administered 2016-03-18: 1 mg via INTRAVENOUS
  Filled 2016-03-18: qty 1

## 2016-03-18 MED ORDER — SODIUM CHLORIDE 0.9% FLUSH
3.0000 mL | Freq: Two times a day (BID) | INTRAVENOUS | Status: DC
  Administered 2016-03-18 – 2016-03-23 (×4): 3 mL via INTRAVENOUS

## 2016-03-18 MED ORDER — SODIUM CHLORIDE 0.9 % IV BOLUS (SEPSIS)
1000.0000 mL | Freq: Once | INTRAVENOUS | Status: AC
  Administered 2016-03-18: 1000 mL via INTRAVENOUS

## 2016-03-18 MED ORDER — ONDANSETRON HCL 4 MG/2ML IJ SOLN
4.0000 mg | Freq: Four times a day (QID) | INTRAMUSCULAR | Status: DC | PRN
  Administered 2016-03-18 – 2016-03-23 (×7): 4 mg via INTRAVENOUS
  Filled 2016-03-18 (×7): qty 2

## 2016-03-18 MED ORDER — ACETAMINOPHEN 650 MG RE SUPP: 650.0000 mg | Freq: Four times a day (QID) | RECTAL | Status: DC | PRN

## 2016-03-18 MED ORDER — ALPRAZOLAM 0.25 MG PO TABS: 0.2500 mg | ORAL_TABLET | Freq: Every evening | ORAL | Status: DC | PRN

## 2016-03-18 MED ORDER — SODIUM CHLORIDE 0.9 % IV SOLN
INTRAVENOUS | Status: DC
  Administered 2016-03-18 – 2016-03-21 (×8): via INTRAVENOUS

## 2016-03-18 MED ORDER — MAGNESIUM SULFATE 2 GM/50ML IV SOLN
2.0000 g | Freq: Once | INTRAVENOUS | Status: AC
  Administered 2016-03-18: 2 g via INTRAVENOUS
  Filled 2016-03-18: qty 50

## 2016-03-18 MED ORDER — DEXTROSE 5 % IV SOLN
1.0000 g | INTRAVENOUS | Status: DC
  Administered 2016-03-18 – 2016-03-19 (×2): 1 g via INTRAVENOUS
  Filled 2016-03-18 (×3): qty 10

## 2016-03-18 NOTE — ED Notes (Signed)
Patient presents to the ED with weakness, nausea, and vomiting.  Patient reports having an episode of Afib this morning.  Patient had an infusion of magnesium and potassium at the cancer center on Friday.  Patient reports having endometrial cancer status "post uterine".  Cancer has metastasized to patient's colon.  Patient has a colostomy and has recently noticed pills that she swallows coming out of colostomy whole.  Patient appears pale, slightly diaphoretic and uncomfortable at this time.

## 2016-03-18 NOTE — Progress Notes (Signed)
Pharmacy Consult for Electrolyte Monitoring and Replacement Indication: Hypokalemia, Hypomagnesemia   No Known Allergies  Patient Measurements: Height: 5\' 2"  (157.5 cm) Weight: 150 lb (68.04 kg) IBW/kg (Calculated) : 50.1  Vital Signs: Temp: 98.3 F (36.8 C) (07/16 1015) Temp Source: Oral (07/16 1015) BP: 85/61 mmHg (07/16 1149) Pulse Rate: 106 (07/16 1149) Intake/Output from previous day:   Intake/Output from this shift:    Labs:  Recent Labs  03/16/16 1017 03/18/16 1030  WBC 3.5* 5.5  HGB 13.8 13.6  HCT 40.9 38.5  PLT 254 253     Recent Labs  03/16/16 1017 03/18/16 1030  NA 135 134*  K 3.1* 2.8*  CL 99* 98*  CO2 22 21*  GLUCOSE 135* 112*  BUN 61* 73*  CREATININE 1.98* 2.29*  CALCIUM 6.9* 7.6*  MG 0.9* 1.5*  PHOS  --  3.9  PROT  --  5.5*  ALBUMIN  --  2.4*  AST  --  30  ALT  --  17  ALKPHOS  --  105  BILITOT  --  0.7   Estimated Creatinine Clearance: 21 mL/min (by C-G formula based on Cr of 2.29).   No results for input(s): GLUCAP in the last 72 hours.  Medical History: Past Medical History  Diagnosis Date  . Diverticulitis of colon (without mention of hemorrhage)   . Mechanical complication of colostomy and enterostomy   . Cancer [facility]) 2011    endometrial  . Personal history of tobacco use, presenting hazards to health   . History of kidney stones 2011  . Obesity, unspecified   . Chronic kidney disease     STONES    Medications:  Scheduled:  . heparin  5,000 Units Subcutaneous Q8H  . sodium chloride flush  3 mL Intravenous Q12H    Assessment: Pharmacy consulted to assist in managing and replacing electrolytes in this 70 y/o F admitted with weakness and dehydration.   Plan:  Magnesium replaced with 2 g iv. Will replace potassium with 6 runs KCl 10 meq IV and recheck K at 2000.   Ulice Dash D 03/18/2016,12:12 PM

## 2016-03-18 NOTE — ED Provider Notes (Signed)
[facility] Emergency Department Provider Note  ____________________________________________  Time seen: Approximately 10:31 AM  I have reviewed the triage vital signs and the nursing notes.   HISTORY  Chief Complaint Weakness    HPI [name] is a 70 y.o. female presents for evaluation of weakness, feeling lightheaded, and severe "dehydration". Patient reports he's had ongoing very loose yellow stools for over a month, she has recently seen and had a very low magnesium level and had to have this repleted. She is currently undergoing chemotherapy for endometrial cancer. She feels very weak, to the point she's not been able to walk for essentially the last day. She denies any numbness weakness tingling in any one arm or leg. No facial droop or trouble speaking but reports she just feels extremely weak as though she is going to pass out whenever she tries to stand. She was not able to stand and walk in the emergency room today.  She reports no fever. No black or bloody stool. She's had multiple rounds of nausea with vomiting up food for the last several days.   No chest pain, cough or shortness of breath. No rash.  Past Medical History  Diagnosis Date  . Diverticulitis of colon (without mention of hemorrhage)   . Mechanical complication of colostomy and enterostomy   . Cancer [facility]) 2011    endometrial  . Personal history of tobacco use, presenting hazards to health   . History of kidney stones 2011  . Obesity, unspecified   . Chronic kidney disease     STONES    Patient Active Problem List   Diagnosis Date Noted  . Hypomagnesemia 03/07/2016  . Watery stools 01/23/2016  . Generalized abdominal pain 01/23/2016  . Endometrial cancer ([place]) 02/10/2015  . Paroxysmal supraventricular tachycardia ([place]) 05/24/2014  . History of endometrial cancer 04/09/2014  . Tubular adenomas removed from the transverse colon in June 2010. Hyperplastic polyps  identified in the sigmoid colon. 03/02/2014  . History of colon polyps 03/02/2014  . Ventral hernia 09/29/2013  . 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 Complication of external stoma of gastrointestinal tract 02/19/2013    Past Surgical History  Procedure Laterality Date  . Appendectomy    . Lithotripsy  2009  . Tubal ligation    . Pyelolithotomy    . Colostomy  2011  . Abdominal hysterectomy  2011  . Dilation and curettage of uterus    . Mole removal  2003  . Hysteroscopy  2011  . Colonoscopy  June 2010,03/31/14    Tubular adenoma of the transverse colon, hyperplastic polyps of the sigmoid.  . 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 Colon surgery  05/20/1999    Drainage of pelvic abscess, end colostomy  . Colon surgery  11/15/2010    Revision of colostomy stoma  . Skin cancer removal  02/2014  . Hernia repair  04/06/14    ventral hernia, tetro rectus Ventralex ST mesh, 20 x 33 cm.   . Cholecystectomy  05/07/14    Incidental during ventral hernia repair.   . Portacath placement N/A 03/01/2016    Procedure: INSERTION PORT-A-CATH;  Surgeon: [name], MD;  Location: [facility] ORS;  Service: General;  Laterality: N/A;    Current Outpatient Rx  Name  Route  Sig  Dispense  Refill  . ALPRAZolam (XANAX) 0.25 MG tablet   Oral   Take 0.25 mg by mouth  at bedtime as needed for anxiety.          Marland Kitchen HYDROcodone-acetaminophen (NORCO) 5-325 MG tablet   Oral   Take 1-2 tablets by mouth every 4 (four) hours as needed for moderate pain. Patient not taking: Reported on 03/07/2016   30 tablet   0   . ondansetron (ZOFRAN) 8 MG tablet   Oral   Take 1 tablet (8 mg total) by mouth 2 (two) times daily as needed for refractory nausea / vomiting. Start on day 3 after chemo.   30 tablet   1   . potassium chloride (K-DUR) 10 MEQ tablet   Oral   Take 1 tablet (10 mEq total) by mouth daily.   20 tablet   0    . sertraline (ZOLOFT) 50 MG tablet   Oral   Take 50 mg by mouth every morning.            Allergies Review of patient's allergies indicates no known allergies.  Family History  Problem Relation Age of Onset  . Breast cancer Maternal Grandmother   . Colon cancer Mother   . Cancer Mother     Social History Social History  Substance Use Topics  . Smoking status: Current Every Day Smoker -- 0.25 packs/day for 15 years    Types: Cigarettes  . Smokeless tobacco: Never Used  . Alcohol Use: No    Review of Systems Constitutional: No fever/chills. Feels very weak and fatigued Eyes: No visual changes. ENT: No sore throat. Cardiovascular: Denies chest pain. Respiratory: Denies shortness of breath. Gastrointestinal: Occasionally crampy abdominal pain but has been an issue for the last several months with no change. No nausea, no vomiting. Frequent loose yellow stools, chronic. constipation. Genitourinary: Negative for dysuria. Musculoskeletal: Negative for back pain. Skin: Negative for rash. Neurological: Negative for headaches, focal weakness or numbness.  10-point ROS otherwise negative.  ____________________________________________   PHYSICAL EXAM:  VITAL SIGNS: ED Triage Vitals  Enc Vitals Group     BP --      Pulse Rate 03/18/16 1015 126     Resp 03/18/16 1015 20     Temp 03/18/16 1015 98.3 F (36.8 C)     Temp Source 03/18/16 1015 Oral     SpO2 03/18/16 1015 100 %     Weight 03/18/16 1015 150 lb (68.04 kg)     Height 03/18/16 1015 5\' 2"  (1.575 m)     Head Cir --      Peak Flow --      Pain Score 03/18/16 1016 4     Pain Loc --      Pain Edu? --      Excl. in [place]? --    Constitutional: Alert and oriented. Very fatigued, but in no distress. She reports she is having severe intractable nausea. She is fully alert and speaking, but appears acutely ill.  Eyes: Conjunctivae are normal. PERRL. EOMI. Head: Atraumatic. Nose: No  congestion/rhinnorhea. Mouth/Throat: Mucous membranes are very dry.  Oropharynx non-erythematous. Neck: No stridor.   Cardiovascular: Tachycardic rate, regular rhythm. Grossly normal heart sounds.  Good peripheral circulation except for slightly cool in the hands and feet bilateral. Respiratory: Normal respiratory effort.  No retractions. Lungs CTAB. Gastrointestinal: Soft and nontender. No distention. Ostomy draining clear yellow output. Musculoskeletal: No lower extremity tenderness nor edema.  No joint effusions. Neurologic:  Normal speech and language. No gross focal neurologic deficits are appreciated. Skin:  Skin is warm, dry and intact. No rash noted. Psychiatric: Mood  and affect are normal. Speech and behavior are normal.  ____________________________________________   LABS (all labs ordered are listed, but only abnormal results are displayed)  Labs Reviewed  MAGNESIUM - Abnormal; Notable for the following:    Magnesium 1.5 (*)    All other components within normal limits  COMPREHENSIVE METABOLIC PANEL - Abnormal; Notable for the following:    Sodium 134 (*)    Potassium 2.8 (*)    Chloride 98 (*)    CO2 21 (*)    Glucose, Bld 112 (*)    BUN 73 (*)    Creatinine, Ser 2.29 (*)    Calcium 7.6 (*)    Total Protein 5.5 (*)    Albumin 2.4 (*)    GFR calc non Af Amer 21 (*)    GFR calc Af Amer 24 (*)    All other components within normal limits  TROPONIN I - Abnormal; Notable for the following:    Troponin I 0.04 (*)    All other components within normal limits  CULTURE, BLOOD (ROUTINE X 2)  CULTURE, BLOOD (ROUTINE X 2)  PHOSPHORUS  CBC WITH DIFFERENTIAL/PLATELET  LACTIC ACID, PLASMA  LACTIC ACID, PLASMA  URINALYSIS COMPLETEWITH MICROSCOPIC ([facility] ONLY)   ____________________________________________  EKG  Reviewed his room me at 10:20 AM Heart rate 125 QRS 80 QTc 450 Notable slurring of the T waves is noted, no ischemic change but I am highly suspicious for an  acute electrolyte abnormality such as hypokalemia or hypomagnesemia. The EKG is noted to have diffuse T-wave abnormality. Again I do not believe this is ischemic change but appears to be concerning for potentially critical electrolyte abnormality. ____________________________________________  RADIOLOGY  DG Chest Port 1 View (Final result) Result time: 03/18/16 11:22:01   Final result by Rad Results In Interface (03/18/16 11:22:01)   Narrative:   CLINICAL DATA: Weakness, nausea and vomiting.  EXAM: PORTABLE CHEST 1 VIEW  COMPARISON: 03/01/2016  FINDINGS: There is a right chest wall port a catheter noted with tip in the projection of the SVC peer heart size is normal. No pleural effusion or edema identified. Subsegmental atelectasis noted within the right midlung. No airspace consolidation identified.  IMPRESSION: 1. Right midlung subsegmental atelectasis.   Electronically Signed By: Kerby Moors M.D. On: 03/18/2016 11:22    ____________________________________________   PROCEDURES  Procedure(s) performed: None  Critical Care performed: Yes, see critical care note(s)  CRITICAL CARE Performed by: Delman Kitten   Total critical care time: 40 minutes. Presents with SBP < 80, requiring emergent evaluation.  Critical care time was exclusive of separately billable procedures and treating other patients.  Critical care was necessary to treat or prevent imminent or life-threatening deterioration.  Critical care was time spent personally by me on the following activities: development of treatment plan with patient and/or surrogate as well as nursing, discussions with consultants, evaluation of patient's response to treatment, examination of patient, obtaining history from patient or surrogate, ordering and performing treatments and interventions, ordering and review of laboratory studies, ordering and review of radiographic studies, pulse oximetry and re-evaluation of  patient's condition.  ____________________________________________   INITIAL IMPRESSION / ASSESSMENT AND PLAN / ED COURSE  Pertinent labs & imaging results that were available during my care of the patient were reviewed by me and considered in my medical decision making (see chart for details).  Patient was for evaluation of fatigue, weakness, and ongoing ostomy output with recent electrolyte repletion. She denies any recent new changes in her health status such as  fevers, cough infection, but reports just a progressive ongoing feeling of worsening dehydration due to ongoing ostomy output which she says is been evaluated many times with surgery and primary.  Differential diagnosis certainly broad given the patient's presentation, I am concerned about her acute EKG abnormalities including those consistent with hypokalemia and hypomagnesemia seen primarily on ECG. We will initiate electrode patient, hydration, check labs, and continue to monitor her very carefully due to severe severe hypotension.  ----------------------------------------- 11:37 AM on 03/18/2016 -----------------------------------------  Patient resting comfortably, requesting a popsicle. Reports nauseous much better. She feels better. No longer feeling like she is going to pass out. Blood pressure improved to 90/60 presently, showing signs of improvement. I am not sure that there is an obvious infectious etiology, rather I suspect this is likely severe dehydration with electrolyte abnormalities, however we will admit her to the hospital and continued to rule this out. Urinalysis is pending at this time. Case discussed with Dr. Lavetta Nielsen. ____________________________________________   FINAL CLINICAL IMPRESSION(S) / ED DIAGNOSES  Final diagnoses:  Dehydration  Acute kidney injury ([place])  Hypokalemia  Hypomagnesemia      Delman Kitten, MD 03/18/16 1140

## 2016-03-18 NOTE — H&P (Addendum)
[facility] NAME: [name]    MR#:  [id number]  DATE OF BIRTH:  03/16/1946   DATE OF ADMISSION:  03/18/2016  PRIMARY CARE PHYSICIAN: [name], MD   REQUESTING/REFERRING PHYSICIAN: [place]  CHIEF COMPLAINT:   Chief Complaint  Patient presents with  . Weakness  Nausea vomiting  HISTORY OF PRESENT ILLNESS:  [name]  is a 70 y.o. female with a known history of Uterine cancer on chemotherapy follows with Dr. Mike Gip of oncology who is presenting with progressive weakness in the setting of persistent nausea, vomiting of nonbloody nonbilious emesis, increased ostomy output. she states that she's had these symptoms for a few days and has been progressively feeling run down. She also describes having a presyncopal episode with associated palpitations which sounds orthostatic in nature. On arrival to emergency department noted to be hypotensive requiring IV fluid hydration also multiple lab abnormalities She also notes having abdominal pain periumbilical/epigastric, cramping in nature 6/10 no worsening relieving factors PAST MEDICAL HISTORY:   Past Medical History  Diagnosis Date  . Diverticulitis of colon (without mention of hemorrhage)   . Mechanical complication of colostomy and enterostomy   . Cancer [facility]) 2011    endometrial  . Personal history of tobacco use, presenting hazards to health   . History of kidney stones 2011  . Obesity, unspecified   . Chronic kidney disease     STONES    PAST SURGICAL HISTORY:   Past Surgical History  Procedure Laterality Date  . Appendectomy    . Lithotripsy  2009  . Tubal ligation    . Pyelolithotomy    . Colostomy  2011  . Abdominal hysterectomy  2011  . Dilation and curettage of uterus    . Mole removal  2003  . Hysteroscopy  2011  . Colonoscopy  June 2010,03/31/14    Tubular adenoma of the transverse colon, hyperplastic polyps of the sigmoid.  . 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 Colon surgery  05/20/1999    Drainage of pelvic abscess, end colostomy  . Colon surgery  11/15/2010    Revision of colostomy stoma  . Skin cancer removal  02/2014  . Hernia repair  04/06/14    ventral hernia, tetro rectus Ventralex ST mesh, 20 x 33 cm.   . Cholecystectomy  05/07/14    Incidental during ventral hernia repair.   . Portacath placement N/A 03/01/2016    Procedure: INSERTION PORT-A-CATH;  Surgeon: [name], MD;  Location: [facility] ORS;  Service: General;  Laterality: N/A;    SOCIAL HISTORY:   Social History  Substance Use Topics  . Smoking status: Current Every Day Smoker -- 0.25 packs/day for 15 years    Types: Cigarettes  . Smokeless tobacco: Never Used  . Alcohol Use: No    FAMILY HISTORY:   Family History  Problem Relation Age of Onset  . Breast cancer Maternal Grandmother   . Colon cancer Mother   . Cancer Mother     DRUG ALLERGIES:  No Known Allergies  REVIEW OF SYSTEMS:  REVIEW OF SYSTEMS:  CONSTITUTIONAL: Denies fevers, chills, Positive fatigue, weakness.  EYES: Denies blurred vision, double vision, or eye pain.  EARS, NOSE, THROAT: Denies tinnitus, ear pain, hearing loss.  RESPIRATORY: denies cough, shortness of breath, wheezing  CARDIOVASCULAR: Denies chest pain, palpitations, edema.  GASTROINTESTINAL: Positive nausea, vomiting, diarrhea, abdominal  pain.  GENITOURINARY: Denies dysuria, hematuria.  ENDOCRINE: Denies nocturia or thyroid problems. HEMATOLOGIC AND LYMPHATIC: Denies easy bruising or bleeding.  SKIN: Denies rash or lesions.  MUSCULOSKELETAL: Denies pain in neck, back, shoulder, knees, hips, or further arthritic symptoms.  NEUROLOGIC: Denies paralysis, paresthesias.  PSYCHIATRIC: Denies anxiety or depressive symptoms. Otherwise full review of systems performed by me is negative.   MEDICATIONS AT HOME:   Prior to Admission  medications   Medication Sig Start Date End Date Taking? Authorizing Provider  ALPRAZolam [name]) 0.25 MG tablet Take 0.25 mg by mouth at bedtime as needed for anxiety.  02/09/16   Historical Provider, MD  HYDROcodone-acetaminophen (NORCO) 5-325 MG tablet Take 1-2 tablets by mouth every 4 (four) hours as needed for moderate pain. Patient not taking: Reported on 03/07/2016 03/01/16   [name], MD  ondansetron (ZOFRAN) 8 MG tablet Take 1 tablet (8 mg total) by mouth 2 (two) times daily as needed for refractory nausea / vomiting. Start on day 3 after chemo. 02/28/16   Lequita Asal, MD  potassium chloride (K-DUR) 10 MEQ tablet Take 1 tablet (10 mEq total) by mouth daily. 03/16/16   Lequita Asal, MD  sertraline (ZOLOFT) 50 MG tablet Take 50 mg by mouth every morning.  02/09/16   Historical Provider, MD      VITAL SIGNS:  Blood pressure 85/61, pulse 106, temperature 98.3 F (36.8 C), temperature source Oral, resp. rate 18, height 5\' 2"  (1.575 m), weight 150 lb (68.04 kg), SpO2 97 %.  PHYSICAL EXAMINATION:  VITAL SIGNS: Filed Vitals:   03/18/16 1045 03/18/16 1149  BP:  85/61  Pulse: 116 106  Temp:    Resp: 52 18   GENERAL:70 y.o.female currently in no acute distress.  HEAD: Normocephalic, atraumatic.  EYES: Pupils equal, round, reactive to light. Extraocular muscles intact. No scleral icterus.  MOUTH: Dry mucosal membrane. Dentition intact. No abscess noted.  EAR, NOSE, THROAT: Clear without exudates. No external lesions.  NECK: Supple. No thyromegaly. No nodules. No JVD.  PULMONARY: Clear to ascultation, without wheeze rails or rhonci. No use of accessory muscles, Good respiratory effort. good air entry bilaterally CHEST: Nontender to palpation.  CARDIOVASCULAR: S1 and S2. Regular rate and rhythm. No murmurs, rubs, or gallops. No edema. Pedal pulses 2+ bilaterally.  GASTROINTESTINAL: Soft, nontender, nondistended. No masses. Positive bowel sounds. No hepatosplenomegaly. Ostomy  in place without surrounding erythema or drainage MUSCULOSKELETAL: No swelling, clubbing, or edema. Range of motion full in all extremities.  NEUROLOGIC: Cranial nerves II through XII are intact. No gross focal neurological deficits. Sensation intact. Reflexes intact.  SKIN: No ulceration, lesions, rashes, or cyanosis. Skin warm and dry. Turgor intact.  PSYCHIATRIC: Mood, affect within normal limits. The patient is awake, alert and oriented x 3. Insight, judgment intact.    LABORATORY PANEL:   CBC  Recent Labs Lab 03/18/16 1030  WBC 5.5  HGB 13.6  HCT 38.5  PLT 253   ------------------------------------------------------------------------------------------------------------------  Chemistries   Recent Labs Lab 03/18/16 1030  NA 134*  K 2.8*  CL 98*  CO2 21*  GLUCOSE 112*  BUN 73*  CREATININE 2.29*  CALCIUM 7.6*  MG 1.5*  AST 30  ALT 17  ALKPHOS 105  BILITOT 0.7   ------------------------------------------------------------------------------------------------------------------  Cardiac Enzymes  Recent Labs Lab 03/18/16 1030  TROPONINI 0.04*   ------------------------------------------------------------------------------------------------------------------  RADIOLOGY:  Dg Chest Port 1 View  03/18/2016  CLINICAL DATA:  Weakness, nausea and vomiting. EXAM: PORTABLE CHEST 1 VIEW COMPARISON:  03/01/2016 FINDINGS:  There is a right chest wall port a catheter noted with tip in the projection of the SVC peer heart size is normal. No pleural effusion or edema identified. Subsegmental atelectasis noted within the right midlung. No airspace consolidation identified. IMPRESSION: 1. Right midlung subsegmental atelectasis. Electronically Signed   By: Kerby Moors M.D.   On: 03/18/2016 11:22    EKG:   Orders placed or performed during the hospital encounter of 03/18/16  . ED EKG  . ED EKG    IMPRESSION AND PLAN:   70 year old Caucasian female history of endometrial  cancer undergoing active chemotherapy resented with weakness nausea vomiting diarrhea  1. Acute renal failure: Prerenal, IV fluid hydration and follow urine output/renal function if no improvement will get nephrology involved Air cell 2. Intractable nausea vomiting: Likely in relation to chemotherapy agents supportive measures 3. Endometrial cancer: Consult oncology 4. Hypokalemia, hypomagnesemia: Replace goal potassium 4-5, goal magnesium greater than 2 5. Venous thrombi embolism prophylactic: Lovenox  Addendum: Ua concerning for infection, in combination with symptoms and hypotension -- add ceftrixone  All the records are reviewed and case discussed with ED provider. Management plans discussed with the patient, family and they are in agreement.  CODE STATUS: Full  TOTAL TIME TAKING CARE OF THIS PATIENT: 33 minutes.    Hower,  Karenann Cai.D on 03/18/2016 at 12:24 PM  Between 7am to 6pm - Pager - [phone]  After 6pm: House Pager: - [facility]  Office  [phone]  CC: Primary care physician; [name], MD

## 2016-03-18 NOTE — Progress Notes (Signed)
Spoke with Dr. Lavetta Nielsen, pt requesting prn ativan for nausea.  New orders to follow.  Clarise Cruz, RN

## 2016-03-18 NOTE — Care Management Important Message (Signed)
Important Message  Patient Details  Name: [name] MRN: [id number] Date of Birth: Dec 17, 1945   Medicare Important Message Given:  Yes    Luciana Cammarata A, RN 03/18/2016, 3:25 PM

## 2016-03-19 DIAGNOSIS — R112 Nausea with vomiting, unspecified: Secondary | ICD-10-CM

## 2016-03-19 DIAGNOSIS — N179 Acute kidney failure, unspecified: Principal | ICD-10-CM

## 2016-03-19 DIAGNOSIS — E860 Dehydration: Secondary | ICD-10-CM | POA: Insufficient documentation

## 2016-03-19 DIAGNOSIS — C541 Malignant neoplasm of endometrium: Secondary | ICD-10-CM

## 2016-03-19 LAB — PHOSPHORUS: PHOSPHORUS: 3 mg/dL (ref 2.5–4.6)

## 2016-03-19 LAB — CBC
HCT: 32.6 % — ABNORMAL LOW (ref 35.0–47.0)
HEMOGLOBIN: 11.2 g/dL — AB (ref 12.0–16.0)
MCH: 33.3 pg (ref 26.0–34.0)
MCHC: 34.4 g/dL (ref 32.0–36.0)
MCV: 96.8 fL (ref 80.0–100.0)
PLATELETS: 184 10*3/uL (ref 150–440)
RBC: 3.37 MIL/uL — ABNORMAL LOW (ref 3.80–5.20)
RDW: 13.4 % (ref 11.5–14.5)
WBC: 3.8 10*3/uL (ref 3.6–11.0)

## 2016-03-19 LAB — BASIC METABOLIC PANEL
ANION GAP: 7 (ref 5–15)
BUN: 50 mg/dL — ABNORMAL HIGH (ref 6–20)
CALCIUM: 7.2 mg/dL — AB (ref 8.9–10.3)
CO2: 20 mmol/L — ABNORMAL LOW (ref 22–32)
CREATININE: 1.51 mg/dL — AB (ref 0.44–1.00)
Chloride: 108 mmol/L (ref 101–111)
GFR, EST AFRICAN AMERICAN: 40 mL/min — AB (ref >60)
GFR, EST NON AFRICAN AMERICAN: 34 mL/min — AB (ref >60)
GLUCOSE: 91 mg/dL (ref 65–99)
Potassium: 3.2 mmol/L — ABNORMAL LOW (ref 3.5–5.1)
Sodium: 135 mmol/L (ref 135–145)

## 2016-03-19 LAB — MAGNESIUM: MAGNESIUM: 1.8 mg/dL (ref 1.7–2.4)

## 2016-03-19 MED ORDER — DIPHENOXYLATE-ATROPINE 2.5-0.025 MG PO TABS: 1.0000 | ORAL_TABLET | Freq: Four times a day (QID) | ORAL | Status: DC | PRN

## 2016-03-19 MED ORDER — SODIUM CHLORIDE 0.9 % IV BOLUS (SEPSIS)
1000.0000 mL | Freq: Once | INTRAVENOUS | Status: AC
Start: 2016-03-19 — End: 2016-03-19
  Administered 2016-03-19: 15:00:00 1000 mL via INTRAVENOUS

## 2016-03-19 MED ORDER — POTASSIUM CHLORIDE 10 MEQ/100ML IV SOLN
10.0000 meq | INTRAVENOUS | Status: AC
  Administered 2016-03-19 (×4): 10 meq via INTRAVENOUS
  Filled 2016-03-19 (×4): qty 100

## 2016-03-19 MED ORDER — ENOXAPARIN SODIUM 40 MG/0.4ML ~~LOC~~ SOLN
40.0000 mg | SUBCUTANEOUS | Status: DC
  Administered 2016-03-19 – 2016-03-22 (×4): 40 mg via SUBCUTANEOUS
  Filled 2016-03-19 (×4): qty 0.4

## 2016-03-19 MED ORDER — LOPERAMIDE HCL 2 MG PO TABS: 2.0000 mg | ORAL_TABLET | Freq: Four times a day (QID) | ORAL | Status: DC | PRN

## 2016-03-19 MED ORDER — DIPHENOXYLATE-ATROPINE 2.5-0.025 MG/5ML PO LIQD
10.0000 mL | Freq: Four times a day (QID) | ORAL | Status: DC | PRN
  Administered 2016-03-19 – 2016-03-20 (×4): 10 mL via ORAL
  Filled 2016-03-19: qty 5
  Filled 2016-03-19 (×4): qty 10

## 2016-03-19 MED ORDER — DIPHENHYDRAMINE HCL 25 MG PO CAPS
25.0000 mg | ORAL_CAPSULE | Freq: Every evening | ORAL | Status: DC | PRN
  Administered 2016-03-19: 23:00:00 25 mg via ORAL
  Filled 2016-03-19: qty 1

## 2016-03-19 NOTE — Progress Notes (Signed)
Pt refusing bed alarm.  Pt educated and still refusing.

## 2016-03-19 NOTE — Progress Notes (Signed)
Initial Nutrition Assessment  DOCUMENTATION CODES:   Not applicable  INTERVENTION:   Cater to pt preferences on Regular diet order, as pt diet order advanced after visit this am by MD. Will send [employer] Breakfast on breakfast trays as added nutrition. Pt does not like Ensure per report. RD offered Boost Breeze and other options to aid in caloric intake as well as hydration. Education: Pt asking for recommended foods to aid in increasing potassium and magnesium. Pt reports not liking supplement drinks and is wanting to have food options to help maintain electrolytes during chemotherapy and when GI symptoms present. RD printed off 'Potassium Content of Foods' as well as 'Magnesium Content of Foods,' provided by the Academy of Nutrition and Dietetics as a reference. RD also provided 'Nausea and Vomitting Nutrition Therapy' for pt as well for other recommendations.    NUTRITION DIAGNOSIS:   Inadequate oral intake related to poor appetite as evidenced by per patient/family report.  GOAL:   Patient will meet greater than or equal to 90% of their needs  MONITOR:   PO intake, Supplement acceptance, Labs, Weight trends, I & O's  REASON FOR ASSESSMENT:   Consult Poor PO  ASSESSMENT:   Pt admitted with n/v and increased ostomy output for the past few weeks. Pt with h/o uterine cancer on chemotherapy.  Past Medical History  Diagnosis Date  . Diverticulitis of colon (without mention of hemorrhage)   . Mechanical complication of colostomy and enterostomy   . Cancer [facility]) 2011    endometrial  . Personal history of tobacco use, presenting hazards to health   . History of kidney stones 2011  . Obesity, unspecified   . Chronic kidney disease     STONES    Diet Order:  Diet regular Room service appropriate?: Yes; Fluid consistency:: Thin    Pt reports eating grits and ice cream this am for breakfast. Pt ordered hot open [place] sandwich and mashed potatoes for lunch. Pt  reports tolerating Diet Ginger Ale the best recently.   Medications: KCl, NS at 168mL/hr Labs: K 3.2, BUN 50, Cre 1.51, Magnesium 0.9 on admission, supplemented, WDL this am.   Gastrointestinal Profile: Last BM:  03/18/16 144mL watery stool via ostomy   Nutrition-Focused Physical Exam Findings:  Unable to complete Nutrition-Focused physical exam at this time.    Weight Change: Per consult pt with 10lbs weight loss in 2 weeks (8% weight loss per [facility] in 3 weeks)   Skin:  Reviewed, no issues   Height:   Ht Readings from Last 1 Encounters:  03/18/16 5\' 2"  (1.575 m)    Weight:   Wt Readings from Last 1 Encounters:  03/18/16 150 lb (68.04 kg)   Wt Readings from Last 10 Encounters:  03/18/16 150 lb (68.04 kg)  03/07/16 155 lb 10.3 oz (70.6 kg)  03/01/16 157 lb (71.215 kg)  02/27/16 164 lb 9.2 oz (74.65 kg)  02/08/16 165 lb (74.844 kg)  02/08/16 165 lb 5 oz (74.985 kg)  01/23/16 163 lb (73.936 kg)  12/13/15 172 lb (78.019 kg)  11/25/15 175 lb (79.379 kg)  01/26/15 164 lb 3.9 oz (74.5 kg)    BMI:  Body mass index is 27.43 kg/(m^2).  Estimated Nutritional Needs:   Kcal:  1700-2040kcals  Protein:  82-95g protein  Fluid:  >/= 1.7L fluid   EDUCATION NEEDS:   Education needs addressed  Dwyane Luo, RD, LDN Pager [phone] Weekend/On-Call Pager [phone]

## 2016-03-19 NOTE — Discharge Instructions (Signed)
Resume regular diet and activity °

## 2016-03-19 NOTE — Progress Notes (Signed)
Seen and examined.  AKI improved  Plan is to discharge with lomotil and imodium after oncology sees patient. Discussed with Dr. Geralyn Flash and Dr. Bary Castilla

## 2016-03-19 NOTE — Progress Notes (Signed)
Pharmacy Consult for Electrolyte Monitoring and Replacement Indication: Hypokalemia, Hypomagnesemia   No Known Allergies  Patient Measurements: Height: 5\' 2"  (157.5 cm) Weight: 150 lb (68.04 kg) IBW/kg (Calculated) : 50.1  Vital Signs: Temp: 97.9 F (36.6 C) (07/16 2018) Temp Source: Oral (07/16 2018) BP: 158/72 mmHg (07/16 2018) Pulse Rate: 73 (07/16 2018) Intake/Output from previous day: 07/16 0701 - 07/17 0700 In: 290 [P.O.:240; IV Piggyback:50] Out: 450 [Urine:175; Stool:275] Intake/Output from this shift:    Labs:  Recent Labs  03/16/16 1017 03/18/16 1030  WBC 3.5* 5.5  HGB 13.8 13.6  HCT 40.9 38.5  PLT 254 253     Recent Labs  03/16/16 1017 03/18/16 1030 03/18/16 2204  NA 135 134*  --   K 3.1* 2.8* 3.6  CL 99* 98*  --   CO2 22 21*  --   GLUCOSE 135* 112*  --   BUN 61* 73*  --   CREATININE 1.98* 2.29*  --   CALCIUM 6.9* 7.6*  --   MG 0.9* 1.5*  --   PHOS  --  3.9  --   PROT  --  5.5*  --   ALBUMIN  --  2.4*  --   AST  --  30  --   ALT  --  17  --   ALKPHOS  --  105  --   BILITOT  --  0.7  --    Estimated Creatinine Clearance: 21 mL/min (by C-G formula based on Cr of 2.29).   No results for input(s): GLUCAP in the last 72 hours.  Medical History: Past Medical History  Diagnosis Date  . Diverticulitis of colon (without mention of hemorrhage)   . Mechanical complication of colostomy and enterostomy   . Cancer [facility]) 2011    endometrial  . Personal history of tobacco use, presenting hazards to health   . History of kidney stones 2011  . Obesity, unspecified   . Chronic kidney disease     STONES    Medications:  Scheduled:  . cefTRIAXone (ROCEPHIN)  IV  1 g Intravenous Q24H  . heparin  5,000 Units Subcutaneous Q8H  . sertraline  50 mg Oral BH-q7a  . sodium chloride flush  3 mL Intravenous Q12H    Assessment: Pharmacy consulted to assist in managing and replacing electrolytes in this 70 y/o F admitted with weakness and dehydration.    Plan:  Magnesium replaced with 2 g iv. Will replace potassium with 6 runs KCl 10 meq IV and recheck K at 2000.   7/16 22:00 K+ 3.6. No further supplementation ordered. BMP in AM.  Champion Corales S 03/19/2016,2:29 AM

## 2016-03-19 NOTE — Consult Note (Signed)
[facility]  Date of admission:  03/18/2016  Inpatient day:  03/19/2016  Consulting physician:  Dr David Hower  Reason for Consultation:  Uterine cancer, ARF, N/V/D post chemo  Chief Complaint: [name] is a 70 y.o. female with recurrent endometrial cancer who was admitted on day 12 s/p cycle #1 carboplatin and Taxol with acute renal insufficiency,   HPI:  The patient was diagnosed with endometrial carcinoma in 02/2010. She underwent TAH/BSO.  Pathology revealed grade III endometrial carcinoma. Tumor was 3 x 3 cm and extended through the thickness of the myometrium. There were 2 of 6 right pelvic lymph nodes and 2 of 6 periaortic nodes involved with metastatic disease. Peritoneal washings were negative. Pathologic stage was T3N2M0 (stage III c2).  She received 6 cycles of carboplatin and Taxol from 03/27/2010 until 08/23/2010. She received brachytherapy (completed 11/2010). 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 Following re-anastomosis, she developed a leak and underwent diverting colostomy. She has had 3 incisional hernias.  PET scan on 02/16/2016 revealed a 4.4 x 5.0 cm hypermetabolic soft tissue mass along the right pelvic sidewall. There was lymphadenopathy in the left para-aortic nodal station adjacent to the left renal hilum, and in the right retrocrural nodal station. There was a metastatic lesion in the right ilium. There was evidence of persistent partial small bowel obstruction.  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 MSI testing was negative.  She received cycle #1 carboplatin and Taxol on 03/07/2016.  Prior to chemotherapy, she had baseline abdominal discomfort, diarrhea, and intermittent nausea and vomiting.  She tolerated her chemotherapy well.  Follow-up labs in clinic on  03/16/2016 revealed a low magnesium (0.9), potassium (3.1) and slightly elevated Cr (1.98).  She received IVF, potassium and magnesium in clinic.  Post infusion, she felt better.  She presented to the ER on 03/18/2016 feeling weak and lightheaded.  She was dehydrated secondary to nausea, vomiting, and ongoing voluminous output from her ostomy.  Systolic blood pressure was < 80.  BUN was 73 with a creatinine of 2.29.  Potassium was 2.8 and magnesium 1.5.  She received IVF and electrolyte replacement and was admitted.  Symptomatically, she feels much better today.  She has minimal nausea.  She continues to have significant output from her ostomy.  She tried [name] at home and noted pills in her ostomy bag.  She has received one dose of Lomotil today.   Past Medical History  Diagnosis Date  . Diverticulitis of colon (without mention of hemorrhage)   . Mechanical complication of colostomy and enterostomy   . Cancer (HCC) 2011    endometrial  . Personal history of tobacco use, presenting hazards to health   . History of kidney stones 2011  . Obesity, unspecified   . Chronic kidney disease     STONES    Past Surgical History  Procedure Laterality Date  . Appendectomy    . Lithotripsy  2009  . Tubal ligation    . Pyelolithotomy    . Colostomy  2011  . Abdominal hysterectomy  2011  . Dilation and curettage of uterus    . Mole removal  2003  . Hysteroscopy  2011  . Colonoscopy  June 2010,03/31/14    Tubular adenoma of the transverse colon, hyperplastic polyps of the sigmoid.  . Colon surgery  05/10/2010    Sigmoid colectomy with takedown of   colovaginal fistula, drainage pelvic side wall seroma  . Colon surgery  05/20/1999    Drainage of pelvic abscess, end colostomy  . Colon surgery  11/15/2010    Revision of colostomy stoma  . Skin cancer removal  02/2014  . Hernia repair  04/06/14    ventral hernia, tetro rectus Ventralex ST mesh, 20 x 33 cm.   . Cholecystectomy  05/07/14    Incidental  during ventral hernia repair.   . Portacath placement N/A 03/01/2016    Procedure: INSERTION PORT-A-CATH;  Surgeon: Jeffrey W Byrnett, MD;  Location: [facility] ORS;  Service: General;  Laterality: N/A;    Family History  Problem Relation Age of Onset  . Breast cancer Maternal Grandmother   . Colon cancer Mother   . Cancer Mother     Social History:  reports that she has been smoking Cigarettes.  She has a 3.75 pack-year smoking history. She has never used smokeless tobacco. She reports that she does not drink alcohol or use illicit drugs.  The patient is alone today.  Allergies: No Known Allergies  Medications Prior to Admission  Medication Sig Dispense Refill  . ALPRAZolam (XANAX) 0.25 MG tablet Take 0.25 mg by mouth at bedtime as needed for anxiety.     . potassium chloride (K-DUR) 10 MEQ tablet Take 1 tablet (10 mEq total) by mouth daily. 20 tablet 0    Review of Systems: GENERAL:  Feels better.  No fevers, sweats or weight loss. PERFORMANCE STATUS (ECOG):  2 HEENT:  No visual changes, runny nose, sore throat, mouth sores or tenderness. Lungs: No shortness of breath or cough.  No hemoptysis. Cardiac:  No chest pain, palpitations, orthopnea, or PND. GI:  Nausea.  Liquid stool output from ostomy.  Crampy abdominal discomfort.  No vomiting, constipation, melena or hematochezia. GU:  No urgency, frequency, dysuria, or hematuria. Musculoskeletal:  No back pain.  No joint pain.  No muscle tenderness. Extremities:  No pain or swelling. Skin:  No rashes or skin changes. Neuro:  No headache, numbness or weakness, balance or coordination issues. Endocrine:  No diabetes, thyroid issues, hot flashes or night sweats. Psych:  No mood changes, depression or anxiety. Pain:  No focal pain. Review of systems:  All other systems reviewed and found to be negative.   Physical Exam:  Blood pressure 103/69, pulse 91, temperature 98.1 F (36.7 C), temperature source Oral, resp. rate 16, height 5' 2"  (1.575 m), weight 150 lb (68.04 kg), SpO2 100 %.  GENERAL:  Well developed, well nourished, sitting comfortably on the medical unit in no acute distress. MENTAL STATUS:  Alert and oriented to person, place and time. HEAD:  Short gray hair.  Normocephalic, atraumatic, face symmetric, no Cushingoid features. EYES:  Blue eyes.  Pupils equal round and reactive to light and accomodation.  No conjunctivitis or scleral icterus. ENT:  Oropharynx clear without lesion.  Tongue normal. Mucous membranes moist.  RESPIRATORY:  Clear to auscultation without rales, wheezes or rhonchi. CARDIOVASCULAR:  Regular rate and rhythm without murmur, rub or gallop. ABDOMEN:  Colostomy.  Soft, non-tender, with active bowel sounds, and no hepatosplenomegaly.  No masses. SKIN:  No rashes, ulcers or lesions. EXTREMITIES: No edema, no skin discoloration or tenderness.  No palpable cords. LYMPH NODES: No palpable cervical, supraclavicular, axillary or inguinal adenopathy  NEUROLOGICAL: Unremarkable. PSYCH:  Appropriate.   Results for orders placed or performed during the hospital encounter of 03/18/16 (from the past 48 hour(s))  Magnesium     Status: Abnormal     Collection Time: 03/18/16 10:30 AM  Result Value Ref Range   Magnesium 1.5 (L) 1.7 - 2.4 mg/dL  Phosphorus     Status: None   Collection Time: 03/18/16 10:30 AM  Result Value Ref Range   Phosphorus 3.9 2.5 - 4.6 mg/dL  Comprehensive metabolic panel     Status: Abnormal   Collection Time: 03/18/16 10:30 AM  Result Value Ref Range   Sodium 134 (L) 135 - 145 mmol/L   Potassium 2.8 (L) 3.5 - 5.1 mmol/L   Chloride 98 (L) 101 - 111 mmol/L   CO2 21 (L) 22 - 32 mmol/L   Glucose, Bld 112 (H) 65 - 99 mg/dL   BUN 73 (H) 6 - 20 mg/dL   Creatinine, Ser 2.29 (H) 0.44 - 1.00 mg/dL   Calcium 7.6 (L) 8.9 - 10.3 mg/dL   Total Protein 5.5 (L) 6.5 - 8.1 g/dL   Albumin 2.4 (L) 3.5 - 5.0 g/dL   AST 30 15 - 41 U/L   ALT 17 14 - 54 U/L   Alkaline Phosphatase 105 38 - 126 U/L    Total Bilirubin 0.7 0.3 - 1.2 mg/dL   GFR calc non Af Amer 21 (L) >60 mL/min   GFR calc Af Amer 24 (L) >60 mL/min    Comment: (NOTE) The eGFR has been calculated using the CKD EPI equation. This calculation has not been validated in all clinical situations. eGFR's persistently <60 mL/min signify possible Chronic Kidney Disease.    Anion gap 15 5 - 15  CBC with Differential     Status: None   Collection Time: 03/18/16 10:30 AM  Result Value Ref Range   WBC 5.5 3.6 - 11.0 K/uL   RBC 4.16 3.80 - 5.20 MIL/uL   Hemoglobin 13.6 12.0 - 16.0 g/dL   HCT 38.5 35.0 - 47.0 %   MCV 92.6 80.0 - 100.0 fL   MCH 32.8 26.0 - 34.0 pg   MCHC 35.4 32.0 - 36.0 g/dL   RDW 13.1 11.5 - 14.5 %   Platelets 253 150 - 440 K/uL   Neutrophils Relative % 61 %   Neutro Abs 3.3 1.4 - 6.5 K/uL   Lymphocytes Relative 23 %   Lymphs Abs 1.2 1.0 - 3.6 K/uL   Monocytes Relative 16 %   Monocytes Absolute 0.9 0.2 - 0.9 K/uL   Eosinophils Relative 0 %   Eosinophils Absolute 0.0 0 - 0.7 K/uL   Basophils Relative 0 %   Basophils Absolute 0.0 0 - 0.1 K/uL  Troponin I     Status: Abnormal   Collection Time: 03/18/16 10:30 AM  Result Value Ref Range   Troponin I 0.04 (HH) <0.03 ng/mL    Comment: CRITICAL RESULT CALLED TO, READ BACK BY AND VERIFIED WITH AMBER JONES @ 1114 03/18/16 BY [facility]   Lactic acid, plasma     Status: None   Collection Time: 03/18/16 10:44 AM  Result Value Ref Range   Lactic Acid, Venous 1.6 0.5 - 1.9 mmol/L  Culture, blood (Routine X 2) w Reflex to ID Panel     Status: None (Preliminary result)   Collection Time: 03/18/16 10:44 AM  Result Value Ref Range   Specimen Description BLOOD RIGHT ARM    Special Requests BOTTLES DRAWN AEROBIC AND ANAEROBIC  8CC    Culture NO GROWTH <12 HOURS    Report Status PENDING   Culture, blood (Routine X 2) w Reflex to ID Panel     Status: None (Preliminary result)     Collection Time: 03/18/16 10:44 AM  Result Value Ref Range   Specimen Description BLOOD RIGHT  PORTA CATH CHEST    Special Requests BOTTLES DRAWN AEROBIC AND ANAEROBIC  [place]    Culture NO GROWTH <12 HOURS    Report Status PENDING   Urinalysis complete, with microscopic ([facility] only)     Status: Abnormal   Collection Time: 03/18/16 10:44 AM  Result Value Ref Range   Color, Urine AMBER (A) YELLOW   APPearance CLOUDY (A) CLEAR   Glucose, UA NEGATIVE NEGATIVE mg/dL   Bilirubin Urine NEGATIVE NEGATIVE   Ketones, ur NEGATIVE NEGATIVE mg/dL   Specific Gravity, Urine 1.017 1.005 - 1.030   Hgb urine dipstick 2+ (A) NEGATIVE   pH 5.0 5.0 - 8.0   Protein, ur 100 (A) NEGATIVE mg/dL   Nitrite NEGATIVE NEGATIVE   Leukocytes, UA 3+ (A) NEGATIVE   RBC / HPF 6-30 0 - 5 RBC/hpf   WBC, UA TOO NUMEROUS TO COUNT 0 - 5 WBC/hpf   Bacteria, UA RARE (A) NONE SEEN   Squamous Epithelial / LPF 0-5 (A) NONE SEEN   Trans Epithel, UA 1    WBC Clumps PRESENT    Mucous PRESENT    Hyaline Casts, UA PRESENT   Lactic acid, plasma     Status: None   Collection Time: 03/18/16  2:12 PM  Result Value Ref Range   Lactic Acid, Venous 1.0 0.5 - 1.9 mmol/L  Potassium     Status: None   Collection Time: 03/18/16 10:04 PM  Result Value Ref Range   Potassium 3.6 3.5 - 5.1 mmol/L  Basic metabolic panel     Status: Abnormal   Collection Time: 03/19/16  5:58 AM  Result Value Ref Range   Sodium 135 135 - 145 mmol/L   Potassium 3.2 (L) 3.5 - 5.1 mmol/L   Chloride 108 101 - 111 mmol/L   CO2 20 (L) 22 - 32 mmol/L   Glucose, Bld 91 65 - 99 mg/dL   BUN 50 (H) 6 - 20 mg/dL   Creatinine, Ser 1.51 (H) 0.44 - 1.00 mg/dL   Calcium 7.2 (L) 8.9 - 10.3 mg/dL   GFR calc non Af Amer 34 (L) >60 mL/min   GFR calc Af Amer 40 (L) >60 mL/min    Comment: (NOTE) The eGFR has been calculated using the CKD EPI equation. This calculation has not been validated in all clinical situations. eGFR's persistently <60 mL/min signify possible Chronic Kidney Disease.    Anion gap 7 5 - 15  CBC     Status: Abnormal   Collection Time: 03/19/16   5:58 AM  Result Value Ref Range   WBC 3.8 3.6 - 11.0 K/uL   RBC 3.37 (L) 3.80 - 5.20 MIL/uL   Hemoglobin 11.2 (L) 12.0 - 16.0 g/dL   HCT 32.6 (L) 35.0 - 47.0 %   MCV 96.8 80.0 - 100.0 fL   MCH 33.3 26.0 - 34.0 pg   MCHC 34.4 32.0 - 36.0 g/dL   RDW 13.4 11.5 - 14.5 %   Platelets 184 150 - 440 K/uL  Magnesium     Status: None   Collection Time: 03/19/16  5:58 AM  Result Value Ref Range   Magnesium 1.8 1.7 - 2.4 mg/dL  Phosphorus     Status: None   Collection Time: 03/19/16  5:58 AM  Result Value Ref Range   Phosphorus 3.0 2.5 - 4.6 mg/dL   Dg Chest Port 1 View  03/18/2016  CLINICAL DATA:  Weakness, nausea and vomiting. EXAM: PORTABLE CHEST 1 VIEW COMPARISON:  03/01/2016 FINDINGS: There is a right chest wall port a catheter noted with tip in the projection of the SVC peer heart size is normal. No pleural effusion or edema identified. Subsegmental atelectasis noted within the right midlung. No airspace consolidation identified. IMPRESSION: 1. Right midlung subsegmental atelectasis. Electronically Signed   By: Taylor  Stroud M.D.   On: 03/18/2016 11:22    Assessment:  The patient is a 70 y.o.  woman with with recurrent endometrial cancer admitted on day 12 s/p cycle #1 carboplatin and Taxol secondary to acute renal insufficiency secondary to dehydration from poor oral inake, nausea/vomiting, and significant ostomy output.  She has been rehydrated with improvement in her creatinine from  2.29 to 1.51.  Potassium and magnesium are being repleted.  She has minimal nausea.  Plan:   1.  Oncology:  Day 13 s/p cycle #1 carboplatin and Taxol.  Nausea well controlled.  Cycle #2 planned for 03/28/2016.  2.  Renal:  Dramatic improvement in clinical status s/p replacement of fluids and electrolytes.  Will likely need to provide patient with hydration in clinic or at home on a scheduled or as needed basis to avoid future admissions given baseline GI symptoms and output.    3.  Gastroenterology:  Stool  output unaffected by at home use of [name].  Lomotil being tried today.  Will discuss with surgery consideration of octreotide.  4.  Disposition:  Anticipate discharge home in the next 1-2 days.   Thank you for allowing me to participate in [name] 's care.  I will follow her  closely with you while hospitalized and after discharge in the outpatient department.  Melissa C Corcoran, MD  03/19/2016, 8:46 PM    

## 2016-03-19 NOTE — Progress Notes (Addendum)
Pharmacy Consult for Electrolyte Monitoring and Replacement Indication: Hypokalemia, Hypomagnesemia   No Known Allergies  Patient Measurements: Height: 5\' 2"  (157.5 cm) Weight: 150 lb (68.04 kg) IBW/kg (Calculated) : 50.1  Vital Signs: Temp: 97.8 F (36.6 C) (07/17 0439) Temp Source: Oral (07/17 0439) BP: 104/55 mmHg (07/17 0439) Pulse Rate: 82 (07/17 0439) Intake/Output from previous day: 07/16 0701 - 07/17 0700 In: 290 [P.O.:240; IV Piggyback:50] Out: 550 [Urine:275; Stool:275]  Labs:  Recent Labs  03/16/16 1017 03/18/16 1030 03/19/16 0558  WBC 3.5* 5.5 3.8  HGB 13.8 13.6 11.2*  HCT 40.9 38.5 32.6*  PLT 254 253 184     Recent Labs  03/16/16 1017 03/18/16 1030 03/18/16 2204 03/19/16 0558  NA 135 134*  --  135  K 3.1* 2.8* 3.6 3.2*  CL 99* 98*  --  108  CO2 22 21*  --  20*  GLUCOSE 135* 112*  --  91  BUN 61* 73*  --  50*  CREATININE 1.98* 2.29*  --  1.51*  CALCIUM 6.9* 7.6*  --  7.2*  MG 0.9* 1.5*  --  1.8  PHOS  --  3.9  --  3.0  PROT  --  5.5*  --   --   ALBUMIN  --  2.4*  --   --   AST  --  30  --   --   ALT  --  17  --   --   ALKPHOS  --  105  --   --   BILITOT  --  0.7  --   --    Estimated Creatinine Clearance: 31.8 mL/min (by C-G formula based on Cr of 1.51).   No results for input(s): GLUCAP in the last 72 hours.  Medical History: Past Medical History  Diagnosis Date  . Diverticulitis of colon (without mention of hemorrhage)   . Mechanical complication of colostomy and enterostomy   . Cancer [facility]) 2011    endometrial  . Personal history of tobacco use, presenting hazards to health   . History of kidney stones 2011  . Obesity, unspecified   . Chronic kidney disease     STONES    Medications:  Scheduled:  . cefTRIAXone (ROCEPHIN)  IV  1 g Intravenous Q24H  . heparin  5,000 Units Subcutaneous Q8H  . sertraline  50 mg Oral BH-q7a  . sodium chloride flush  3 mL Intravenous Q12H    Assessment: Pharmacy consulted to assist in  managing and replacing electrolytes in this 70 y/o F admitted with weakness and dehydration.   Plan:  7/17: K: 3.2   Mag: 1.8  Will replace potassium with KCL 51mEq IV X 4 runs.   K+ and Mag ordered with AM labs.  Pharmacy will continue to follow per consult.    Nancy Fetter, PharmD Clinical Pharmacist 03/19/2016 7:32 AM

## 2016-03-20 LAB — BASIC METABOLIC PANEL
Anion gap: 5 (ref 5–15)
BUN: 33 mg/dL — AB (ref 6–20)
CALCIUM: 7.3 mg/dL — AB (ref 8.9–10.3)
CO2: 18 mmol/L — AB (ref 22–32)
CREATININE: 1.13 mg/dL — AB (ref 0.44–1.00)
Chloride: 116 mmol/L — ABNORMAL HIGH (ref 101–111)
GFR calc Af Amer: 56 mL/min — ABNORMAL LOW (ref >60)
GFR calc non Af Amer: 48 mL/min — ABNORMAL LOW (ref >60)
GLUCOSE: 87 mg/dL (ref 65–99)
Potassium: 3.7 mmol/L (ref 3.5–5.1)
Sodium: 139 mmol/L (ref 135–145)

## 2016-03-20 LAB — MAGNESIUM: Magnesium: 1.4 mg/dL — ABNORMAL LOW (ref 1.7–2.4)

## 2016-03-20 LAB — URINE CULTURE

## 2016-03-20 MED ORDER — DEXTROSE 5 % IV SOLN
1.0000 g | INTRAVENOUS | Status: DC
  Administered 2016-03-20: 18:00:00 1 g via INTRAVENOUS
  Filled 2016-03-20 (×2): qty 10

## 2016-03-20 MED ORDER — MAGNESIUM SULFATE 2 GM/50ML IV SOLN
2.0000 g | Freq: Once | INTRAVENOUS | Status: AC
  Administered 2016-03-20: 09:00:00 2 g via INTRAVENOUS
  Filled 2016-03-20: qty 50

## 2016-03-20 MED ORDER — POTASSIUM CHLORIDE 10 MEQ/100ML IV SOLN
10.0000 meq | INTRAVENOUS | Status: DC
  Administered 2016-03-20 (×2): 10 meq via INTRAVENOUS
  Filled 2016-03-20 (×3): qty 100

## 2016-03-20 MED ORDER — CHOLESTYRAMINE 4 G PO PACK
4.0000 g | PACK | Freq: Two times a day (BID) | ORAL | Status: DC
  Administered 2016-03-21 – 2016-03-23 (×5): 4 g via ORAL
  Filled 2016-03-20 (×7): qty 1

## 2016-03-20 NOTE — Progress Notes (Signed)
Pharmacy Consult for Electrolyte Monitoring and Replacement Indication: Hypokalemia, Hypomagnesemia   No Known Allergies  Patient Measurements: Height: 5\' 2"  (157.5 cm) Weight: 150 lb (68.04 kg) IBW/kg (Calculated) : 50.1  Vital Signs: Temp: 97.5 F (36.4 C) (07/18 0516) Temp Source: Oral (07/18 0516) BP: 129/74 mmHg (07/18 0516) Pulse Rate: 89 (07/18 0516) Intake/Output from previous day: 07/17 0701 - 07/18 0700 In: 5920.4 [P.O.:960; I.V.:4960.4] Out: -   Labs:  Recent Labs  03/18/16 1030 03/19/16 0558  WBC 5.5 3.8  HGB 13.6 11.2*  HCT 38.5 32.6*  PLT 253 184     Recent Labs  03/18/16 1030 03/18/16 2204 03/19/16 0558 03/20/16 0543  NA 134*  --  135 139  K 2.8* 3.6 3.2* 3.7  CL 98*  --  108 116*  CO2 21*  --  20* 18*  GLUCOSE 112*  --  91 87  BUN 73*  --  50* 33*  CREATININE 2.29*  --  1.51* 1.13*  CALCIUM 7.6*  --  7.2* 7.3*  MG 1.5*  --  1.8 1.4*  PHOS 3.9  --  3.0  --   PROT 5.5*  --   --   --   ALBUMIN 2.4*  --   --   --   AST 30  --   --   --   ALT 17  --   --   --   ALKPHOS 105  --   --   --   BILITOT 0.7  --   --   --    Estimated Creatinine Clearance: 42.5 mL/min (by C-G formula based on Cr of 1.13).   No results for input(s): GLUCAP in the last 72 hours.  Medical History: Past Medical History  Diagnosis Date  . Diverticulitis of colon (without mention of hemorrhage)   . Mechanical complication of colostomy and enterostomy   . Cancer [facility]) 2011    endometrial  . Personal history of tobacco use, presenting hazards to health   . History of kidney stones 2011  . Obesity, unspecified   . Chronic kidney disease     STONES    Medications:  Scheduled:  . cefTRIAXone (ROCEPHIN)  IV  1 g Intravenous Q24H  . enoxaparin (LOVENOX) injection  40 mg Subcutaneous Q24H  . magnesium sulfate 1 - 4 g bolus IVPB  2 g Intravenous Once  . potassium chloride  10 mEq Intravenous Q1 Hr x 3  . sertraline  50 mg Oral BH-q7a  . sodium chloride flush  3 mL  Intravenous Q12H    Assessment: Pharmacy consulted to assist in managing and replacing electrolytes in this 70 y/o F admitted with weakness and dehydration.   Plan:  7/17: K: 3.2   Mag: 1.8  Will replace potassium with KCL 88mEq IV X 4 runs.   K+ and Mag ordered with AM labs.  Pharmacy will continue to follow per consult.   7/18 AM K 3.7, Mg 1.4. Ordered magnesium sulfate 2 gm IV x 1 and potassium chloride 10 mEq IV Q1H x 3 doses. Will recheck electrolytes with AM labs.  Jaizon Deroos A. [place], [place].D., BCPS Clinical Pharmacist 03/20/2016 7:03 AM

## 2016-03-20 NOTE — Progress Notes (Signed)
[place] at [place] NAME: Ruba Sobotta    MR#:  [id number]  DATE OF BIRTH:  [birth year]  SUBJECTIVE:  CHIEF COMPLAINT:   Chief Complaint  Patient presents with  . Weakness   Nausea is better. No abd pain Still has watery colostomy output  REVIEW OF SYSTEMS:    Review of Systems  Constitutional: Positive for malaise/fatigue. Negative for fever and chills.  HENT: Negative for sore throat.   Eyes: Negative for blurred vision, double vision and pain.  Respiratory: Negative for cough, hemoptysis, shortness of breath and wheezing.   Cardiovascular: Negative for chest pain, palpitations, orthopnea and leg swelling.  Gastrointestinal: Positive for nausea, vomiting and diarrhea. Negative for heartburn, abdominal pain and constipation.  Genitourinary: Negative for dysuria and hematuria.  Musculoskeletal: Negative for back pain and joint pain.  Skin: Negative for rash.  Neurological: Positive for weakness. Negative for sensory change, speech change, focal weakness and headaches.  Endo/Heme/Allergies: Does not bruise/bleed easily.  Psychiatric/Behavioral: Negative for depression. The patient is not nervous/anxious.     DRUG ALLERGIES:  No Known Allergies  VITALS:  Blood pressure 119/70, pulse 93, temperature 97.8 F (36.6 C), temperature source Oral, resp. rate 18, height 5\' 2"  (1.575 m), weight 68.04 kg (150 lb), SpO2 99 %.  PHYSICAL EXAMINATION:   Physical Exam  GENERAL:  70 y.o.-year-old patient lying in the bed with no acute distress.  EYES: Pupils equal, round, reactive to light and accommodation. No scleral icterus. Extraocular muscles intact.  HEENT: Head atraumatic, normocephalic. Oropharynx and nasopharynx clear.  NECK:  Supple, no jugular venous distention. No thyroid enlargement, no tenderness.  LUNGS: Normal breath sounds bilaterally, no wheezing, rales, rhonchi. No use of accessory muscles of respiration.   CARDIOVASCULAR: S1, S2 normal. No murmurs, rubs, or gallops.  ABDOMEN: Soft, nontender, nondistended. Bowel sounds present. No organomegaly or mass. Colostomy bag EXTREMITIES: No cyanosis, clubbing or edema b/l.    NEUROLOGIC: Cranial nerves II through XII are intact. No focal Motor or sensory deficits b/l.   PSYCHIATRIC: The patient is alert and oriented x 3.  SKIN: No obvious rash, lesion, or ulcer.   LABORATORY PANEL:   CBC  Recent Labs Lab 03/19/16 0558  WBC 3.8  HGB 11.2*  HCT 32.6*  PLT 184   ------------------------------------------------------------------------------------------------------------------ Chemistries   Recent Labs Lab 03/18/16 1030  03/20/16 0543  NA 134*  < > 139  K 2.8*  < > 3.7  CL 98*  < > 116*  CO2 21*  < > 18*  GLUCOSE 112*  < > 87  BUN 73*  < > 33*  CREATININE 2.29*  < > 1.13*  CALCIUM 7.6*  < > 7.3*  MG 1.5*  < > 1.4*  AST 30  --   --   ALT 17  --   --   ALKPHOS 105  --   --   BILITOT 0.7  --   --   < > = values in this interval not displayed. ------------------------------------------------------------------------------------------------------------------  Cardiac Enzymes  Recent Labs Lab 03/18/16 1030  TROPONINI 0.04*   ------------------------------------------------------------------------------------------------------------------  RADIOLOGY:  No results found.   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 Geralyn Flash and Dr. Bary Castilla. Started on Lomotil and [name]. Nausea meds WIll start cholestyramine. Monitor overnight. D/C in am or start Sandostatin depending on symptom progress.   * AKI over CKD3 Due to dehydration. Resolved  * Endometrial  carcinoma OP Oncology f/u  * DVT prophylaxis Lovenox  All the records are reviewed and case discussed with Care Management/Social Workerr. Management plans discussed with the  patient, family and they are in agreement.  CODE STATUS: FULL  DVT Prophylaxis: SCDs  TOTAL TIME TAKING CARE OF THIS PATIENT: 30 minutes.   POSSIBLE D/C IN 1-2 DAYS, DEPENDING ON CLINICAL CONDITION.  [name] R M.D on 03/20/2016 at 6:26 PM  Between 7am to 6pm - Pager - [phone]  After 6pm go to [email or web address] - password [email or web address] [facility]  Office  [phone]  CC: Primary care physician; [name], MD  Note: This dictation was prepared with Dragon dictation along with smaller phrase technology. Any transcriptional errors that result from this process are unintentional.

## 2016-03-20 NOTE — Progress Notes (Signed)
[place] at [place] NAME: [name]    MR#:  [id number]  DATE OF BIRTH:  Jul 19, 1946  SUBJECTIVE:  CHIEF COMPLAINT:   Chief Complaint  Patient presents with  . Weakness   Still has nausea. Watery output from colostomy bag  REVIEW OF SYSTEMS:    Review of Systems  Constitutional: Positive for malaise/fatigue. Negative for fever and chills.  HENT: Negative for sore throat.   Eyes: Negative for blurred vision, double vision and pain.  Respiratory: Negative for cough, hemoptysis, shortness of breath and wheezing.   Cardiovascular: Negative for chest pain, palpitations, orthopnea and leg swelling.  Gastrointestinal: Positive for nausea, vomiting and diarrhea. Negative for heartburn, abdominal pain and constipation.  Genitourinary: Negative for dysuria and hematuria.  Musculoskeletal: Negative for back pain and joint pain.  Skin: Negative for rash.  Neurological: Positive for weakness. Negative for sensory change, speech change, focal weakness and headaches.  Endo/Heme/Allergies: Does not bruise/bleed easily.  Psychiatric/Behavioral: Negative for depression. The patient is not nervous/anxious.     DRUG ALLERGIES:  No Known Allergies  VITALS:  Blood pressure 119/70, pulse 93, temperature 97.8 F (36.6 C), temperature source Oral, resp. rate 18, height 5\' 2"  (1.575 m), weight 68.04 kg (150 lb), SpO2 99 %.  PHYSICAL EXAMINATION:   Physical Exam  GENERAL:  70 y.o.-year-old patient lying in the bed with no acute distress.  EYES: Pupils equal, round, reactive to light and accommodation. No scleral icterus. Extraocular muscles intact.  HEENT: Head atraumatic, normocephalic. Oropharynx and nasopharynx clear.  NECK:  Supple, no jugular venous distention. No thyroid enlargement, no tenderness.  LUNGS: Normal breath sounds bilaterally, no wheezing, rales, rhonchi. No use of accessory muscles of respiration.  CARDIOVASCULAR: S1,  S2 normal. No murmurs, rubs, or gallops.  ABDOMEN: Soft, nontender, nondistended. Bowel sounds present. No organomegaly or mass. Colostomy bag EXTREMITIES: No cyanosis, clubbing or edema b/l.    NEUROLOGIC: Cranial nerves II through XII are intact. No focal Motor or sensory deficits b/l.   PSYCHIATRIC: The patient is alert and oriented x 3.  SKIN: No obvious rash, lesion, or ulcer.   LABORATORY PANEL:   CBC  Recent Labs Lab 03/19/16 0558  WBC 3.8  HGB 11.2*  HCT 32.6*  PLT 184   ------------------------------------------------------------------------------------------------------------------ Chemistries   Recent Labs Lab 03/18/16 1030  03/20/16 0543  NA 134*  < > 139  K 2.8*  < > 3.7  CL 98*  < > 116*  CO2 21*  < > 18*  GLUCOSE 112*  < > 87  BUN 73*  < > 33*  CREATININE 2.29*  < > 1.13*  CALCIUM 7.6*  < > 7.3*  MG 1.5*  < > 1.4*  AST 30  --   --   ALT 17  --   --   ALKPHOS 105  --   --   BILITOT 0.7  --   --   < > = values in this interval not displayed. ------------------------------------------------------------------------------------------------------------------  Cardiac Enzymes  Recent Labs Lab 03/18/16 1030  TROPONINI 0.04*   ------------------------------------------------------------------------------------------------------------------  RADIOLOGY:  No results found.   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 Geralyn Flash and Dr. Bary Castilla. Will start Lomotil and [name]. Nausea meds  * AKI over CKD3 Due to dehydration. Improving. Continue IVF  * Endometrial carcinoma OP Oncology f/u  * DVT prophylaxis Lovenox  All the records are reviewed and case  discussed with Care Management/Social Workerr. Management plans discussed with the patient, family and they are in agreement.  CODE STATUS: FULL  DVT Prophylaxis: SCDs  TOTAL TIME TAKING CARE OF THIS  PATIENT: 35 minutes.   POSSIBLE D/C IN 1-2 DAYS, DEPENDING ON CLINICAL CONDITION.  [name] R M.D on 03/20/2016 at 6:17 PM  Between 7am to 6pm - Pager - [phone]  After 6pm go to [email or web address] - password [email or web address] [facility]  Office  [phone]  CC: Primary care physician; [name], MD  Note: This dictation was prepared with Dragon dictation along with smaller phrase technology. Any transcriptional errors that result from this process are unintentional.

## 2016-03-21 ENCOUNTER — Inpatient Hospital Stay: Payer: Medicare Other

## 2016-03-21 LAB — BASIC METABOLIC PANEL
ANION GAP: 7 (ref 5–15)
BUN: 26 mg/dL — ABNORMAL HIGH (ref 6–20)
CALCIUM: 7.5 mg/dL — AB (ref 8.9–10.3)
CO2: 16 mmol/L — ABNORMAL LOW (ref 22–32)
Chloride: 114 mmol/L — ABNORMAL HIGH (ref 101–111)
Creatinine, Ser: 1.32 mg/dL — ABNORMAL HIGH (ref 0.44–1.00)
GFR, EST AFRICAN AMERICAN: 47 mL/min — AB (ref >60)
GFR, EST NON AFRICAN AMERICAN: 40 mL/min — AB (ref >60)
Glucose, Bld: 97 mg/dL (ref 65–99)
POTASSIUM: 3.8 mmol/L (ref 3.5–5.1)
Sodium: 137 mmol/L (ref 135–145)

## 2016-03-21 LAB — PHOSPHORUS: Phosphorus: 3.4 mg/dL (ref 2.5–4.6)

## 2016-03-21 LAB — MAGNESIUM: MAGNESIUM: 1.6 mg/dL — AB (ref 1.7–2.4)

## 2016-03-21 MED ORDER — MAGNESIUM OXIDE 400 (241.3 MG) MG PO TABS
400.0000 mg | ORAL_TABLET | Freq: Every day | ORAL | Status: DC
  Administered 2016-03-21 – 2016-03-23 (×3): 400 mg via ORAL
  Filled 2016-03-21 (×3): qty 1

## 2016-03-21 MED ORDER — MAGNESIUM SULFATE 2 GM/50ML IV SOLN
2.0000 g | Freq: Once | INTRAVENOUS | Status: AC
  Administered 2016-03-21: 2 g via INTRAVENOUS
  Filled 2016-03-21: qty 50

## 2016-03-21 MED ORDER — OCTREOTIDE ACETATE 100 MCG/ML IJ SOLN
100.0000 ug | Freq: Three times a day (TID) | INTRAMUSCULAR | Status: DC
  Administered 2016-03-21 – 2016-03-23 (×8): 100 ug via SUBCUTANEOUS
  Filled 2016-03-21: qty 1
  Filled 2016-03-21: qty 13
  Filled 2016-03-21 (×8): qty 1

## 2016-03-21 NOTE — Progress Notes (Signed)
Pharmacy Consult for Electrolyte Monitoring and Replacement  No Known Allergies  Patient Measurements: Height: 5\' 2"  (157.5 cm) Weight: 150 lb (68.04 kg) IBW/kg (Calculated) : 50.1  Vital Signs: Temp: 97.7 F (36.5 C) (07/19 0454) Temp Source: Oral (07/19 0454) BP: 113/62 mmHg (07/19 0454) Pulse Rate: 86 (07/19 0454) Intake/Output from previous day: 07/18 0701 - 07/19 0700 In: 1347.9 [P.O.:480; I.V.:867.9] Out: 300 [Stool:300]  Labs:  Recent Labs  03/18/16 1030 03/19/16 0558  WBC 5.5 3.8  HGB 13.6 11.2*  HCT 38.5 32.6*  PLT 253 184     Recent Labs  03/18/16 1030  03/19/16 0558 03/20/16 0543 03/21/16 0500  NA 134*  --  135 139 137  K 2.8*  < > 3.2* 3.7 3.8  CL 98*  --  108 116* 114*  CO2 21*  --  20* 18* 16*  GLUCOSE 112*  --  91 87 97  BUN 73*  --  50* 33* 26*  CREATININE 2.29*  --  1.51* 1.13* 1.32*  CALCIUM 7.6*  --  7.2* 7.3* 7.5*  MG 1.5*  --  1.8 1.4* 1.6*  PHOS 3.9  --  3.0  --  3.4  PROT 5.5*  --   --   --   --   ALBUMIN 2.4*  --   --   --   --   AST 30  --   --   --   --   ALT 17  --   --   --   --   ALKPHOS 105  --   --   --   --   BILITOT 0.7  --   --   --   --   < > = values in this interval not displayed. Estimated Creatinine Clearance: 36.4 mL/min (by C-G formula based on Cr of 1.32).   No results for input(s): GLUCAP in the last 72 hours.   Assessment: Pharmacy consulted to assist in managing and replacing electrolytes in this 70 y/o F admitted with weakness and dehydration. Patient received magnesium 2g IV x 1 on 7/18.    Plan:  Will order magnesium 2g IV x 1.   Pharmacy will continue to monitor and adjust per consult.    Currie Paris  03/21/2016 10:06 AM

## 2016-03-21 NOTE — Progress Notes (Signed)
Sandostatin started overnight.  Discussed with Dr. Mike Gip. Further discharge planning per oncology input.

## 2016-03-21 NOTE — Progress Notes (Signed)
Pt has had increased watery bowel movements. Colostomy bag has been changed three times this shift. Lomotil given at 2107 but did not decrease the amount of watery stool. Dr. Jannifer Franklin notified, octeotride 100 mcg SQ q8 hours was ordered. Patient appears anxious and tearful about the current situation. Emotional support and education about the new medication given along with hourly colostomy checks.

## 2016-03-21 NOTE — Care Management Important Message (Signed)
Important Message  Patient Details  Name: [name] MRN: [id number] Date of Birth: 1946-05-18   Medicare Important Message Given:  Yes    [name], RN 03/21/2016, 3:33 PM

## 2016-03-22 DIAGNOSIS — R197 Diarrhea, unspecified: Secondary | ICD-10-CM | POA: Insufficient documentation

## 2016-03-22 LAB — BASIC METABOLIC PANEL
Anion gap: 8 (ref 5–15)
BUN: 21 mg/dL — AB (ref 6–20)
CALCIUM: 8.1 mg/dL — AB (ref 8.9–10.3)
CO2: 15 mmol/L — AB (ref 22–32)
CREATININE: 1.27 mg/dL — AB (ref 0.44–1.00)
Chloride: 115 mmol/L — ABNORMAL HIGH (ref 101–111)
GFR calc non Af Amer: 42 mL/min — ABNORMAL LOW (ref >60)
GFR, EST AFRICAN AMERICAN: 49 mL/min — AB (ref >60)
Glucose, Bld: 111 mg/dL — ABNORMAL HIGH (ref 65–99)
Potassium: 2.9 mmol/L — ABNORMAL LOW (ref 3.5–5.1)
SODIUM: 138 mmol/L (ref 135–145)

## 2016-03-22 LAB — POTASSIUM: Potassium: 3.1 mmol/L — ABNORMAL LOW (ref 3.5–5.1)

## 2016-03-22 MED ORDER — POTASSIUM CHLORIDE ER 20 MEQ PO TBCR: 20.0000 meq | EXTENDED_RELEASE_TABLET | Freq: Two times a day (BID) | ORAL | Status: DC

## 2016-03-22 MED ORDER — LOPERAMIDE HCL 2 MG PO CAPS
2.0000 mg | ORAL_CAPSULE | Freq: Three times a day (TID) | ORAL | Status: DC
  Administered 2016-03-22 – 2016-03-23 (×4): 2 mg via ORAL
  Filled 2016-03-22 (×4): qty 1

## 2016-03-22 MED ORDER — MAGNESIUM SULFATE 2 GM/50ML IV SOLN
2.0000 g | Freq: Once | INTRAVENOUS | Status: AC
  Administered 2016-03-22: 2 g via INTRAVENOUS
  Filled 2016-03-22: qty 50

## 2016-03-22 MED ORDER — POTASSIUM CHLORIDE CRYS ER 20 MEQ PO TBCR
40.0000 meq | EXTENDED_RELEASE_TABLET | ORAL | Status: AC
Start: 2016-03-22 — End: 2016-03-22
  Administered 2016-03-22 (×2): 40 meq via ORAL
  Filled 2016-03-22 (×2): qty 2

## 2016-03-22 NOTE — Progress Notes (Signed)
[place] at [place] NAME: [name]    MR#:  [id number]  DATE OF BIRTH:  [birth year]  SUBJECTIVE:  CHIEF COMPLAINT:   Chief Complaint  Patient presents with  . Weakness   Patient seen earlier. Some nausea but no vomiting. Cramping better.  Sandostatin started overnight.  REVIEW OF SYSTEMS:    Review of Systems  Constitutional: Positive for malaise/fatigue. Negative for fever and chills.  HENT: Negative for sore throat.   Eyes: Negative for blurred vision, double vision and pain.  Respiratory: Negative for cough, hemoptysis, shortness of breath and wheezing.   Cardiovascular: Negative for chest pain, palpitations, orthopnea and leg swelling.  Gastrointestinal: Positive for nausea, vomiting and diarrhea. Negative for heartburn, abdominal pain and constipation.  Genitourinary: Negative for dysuria and hematuria.  Musculoskeletal: Negative for back pain and joint pain.  Skin: Negative for rash.  Neurological: Positive for weakness. Negative for sensory change, speech change, focal weakness and headaches.  Endo/Heme/Allergies: Does not bruise/bleed easily.  Psychiatric/Behavioral: Negative for depression. The patient is not nervous/anxious.     DRUG ALLERGIES:  No Known Allergies  VITALS:  Blood pressure 119/75, pulse 82, temperature 98.6 F (37 C), temperature source Oral, resp. rate 20, height 5\' 2"  (1.575 m), weight 68.04 kg (150 lb), SpO2 99 %.  PHYSICAL EXAMINATION:   Physical Exam  GENERAL:  70 y.o.-year-old patient lying in the bed with no acute distress.  EYES: Pupils equal, round, reactive to light and accommodation. No scleral icterus. Extraocular muscles intact.  HEENT: Head atraumatic, normocephalic. Oropharynx and nasopharynx clear.  NECK:  Supple, no jugular venous distention. No thyroid enlargement, no tenderness.  LUNGS: Normal breath sounds bilaterally, no wheezing, rales, rhonchi. No use of  accessory muscles of respiration.  CARDIOVASCULAR: S1, S2 normal. No murmurs, rubs, or gallops.  ABDOMEN: Soft, nontender, nondistended. Bowel sounds present. No organomegaly or mass. Colostomy bag EXTREMITIES: No cyanosis, clubbing or edema b/l.    NEUROLOGIC: Cranial nerves II through XII are intact. No focal Motor or sensory deficits b/l.   PSYCHIATRIC: The patient is alert and oriented x 3.  SKIN: No obvious rash, lesion, or ulcer.   LABORATORY PANEL:   CBC  Recent Labs Lab 03/19/16 0558  WBC 3.8  HGB 11.2*  HCT 32.6*  PLT 184   ------------------------------------------------------------------------------------------------------------------ Chemistries   Recent Labs Lab 03/18/16 1030  03/21/16 0500  NA 134*  < > 137  K 2.8*  < > 3.8  CL 98*  < > 114*  CO2 21*  < > 16*  GLUCOSE 112*  < > 97  BUN 73*  < > 26*  CREATININE 2.29*  < > 1.32*  CALCIUM 7.6*  < > 7.5*  MG 1.5*  < > 1.6*  AST 30  --   --   ALT 17  --   --   ALKPHOS 105  --   --   BILITOT 0.7  --   --   < > = values in this interval not displayed. ------------------------------------------------------------------------------------------------------------------  Cardiac Enzymes  Recent Labs Lab 03/18/16 1030  TROPONINI 0.04*   ------------------------------------------------------------------------------------------------------------------  RADIOLOGY:  No results found.   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 Geralyn Flash. GI input needed Started on Lomotil and [name]. Nausea meds Continue sandostatin.  * AKI over CKD3 Due to dehydration. Resolved  * Endometrial carcinoma OP Oncology f/u  * Hypomagnesemia Started PO MgOx 400mg   daily  * DVT prophylaxis Lovenox  All the records are reviewed and case discussed with Care Management/Social Workerr. Management plans discussed with the patient,  family and they are in agreement.  CODE STATUS: FULL  DVT Prophylaxis: SCDs  TOTAL TIME TAKING CARE OF THIS PATIENT: 30 minutes.   POSSIBLE D/C IN 1-2 DAYS, DEPENDING ON CLINICAL CONDITION.  [name] R M.D on 03/22/2016 at 9:53 AM  Between 7am to 6pm - Pager - [phone]  After 6pm go to [email or web address] - password [email or web address] [facility]  Office  [phone]  CC: Primary care physician; [name], MD  Note: This dictation was prepared with Dragon dictation along with smaller phrase technology. Any transcriptional errors that result from this process are unintentional.

## 2016-03-22 NOTE — Progress Notes (Signed)
[place] at [place] NAME: [name]    MR#:  [id number]  DATE OF BIRTH:  02-24-1946  SUBJECTIVE:  CHIEF COMPLAINT:   Chief Complaint  Patient presents with  . Weakness   Nausea persisted. Still has significant output from colostomy bag. Good response to Sandostatin but only for about 4 hours after which she has liquid stool.  REVIEW OF SYSTEMS:    Review of Systems  Constitutional: Positive for malaise/fatigue. Negative for fever and chills.  HENT: Negative for sore throat.   Eyes: Negative for blurred vision, double vision and pain.  Respiratory: Negative for cough, hemoptysis, shortness of breath and wheezing.   Cardiovascular: Negative for chest pain, palpitations, orthopnea and leg swelling.  Gastrointestinal: Positive for nausea, vomiting and diarrhea. Negative for heartburn, abdominal pain and constipation.  Genitourinary: Negative for dysuria and hematuria.  Musculoskeletal: Negative for back pain and joint pain.  Skin: Negative for rash.  Neurological: Positive for weakness. Negative for sensory change, speech change, focal weakness and headaches.  Endo/Heme/Allergies: Does not bruise/bleed easily.  Psychiatric/Behavioral: Negative for depression. The patient is not nervous/anxious.     DRUG ALLERGIES:  No Known Allergies  VITALS:  Blood pressure 119/75, pulse 82, temperature 98.6 F (37 C), temperature source Oral, resp. rate 20, height 5\' 2"  (1.575 m), weight 68.04 kg (150 lb), SpO2 99 %.  PHYSICAL EXAMINATION:   Physical Exam  GENERAL:  70 y.o.-year-old patient lying in the bed with no acute distress.  Colostomy bag in place Alert and oriented 3 Pleasant  LABORATORY PANEL:   CBC  Recent Labs Lab 03/19/16 0558  WBC 3.8  HGB 11.2*  HCT 32.6*  PLT 184   ------------------------------------------------------------------------------------------------------------------ Chemistries   Recent  Labs Lab 03/18/16 1030  03/21/16 0500 03/22/16 1023  NA 134*  < > 137 138  K 2.8*  < > 3.8 2.9*  CL 98*  < > 114* 115*  CO2 21*  < > 16* 15*  GLUCOSE 112*  < > 97 111*  BUN 73*  < > 26* 21*  CREATININE 2.29*  < > 1.32* 1.27*  CALCIUM 7.6*  < > 7.5* 8.1*  MG 1.5*  < > 1.6*  --   AST 30  --   --   --   ALT 17  --   --   --   ALKPHOS 105  --   --   --   BILITOT 0.7  --   --   --   < > = values in this interval not displayed. ------------------------------------------------------------------------------------------------------------------  Cardiac Enzymes  Recent Labs Lab 03/18/16 1030  TROPONINI 0.04*   ------------------------------------------------------------------------------------------------------------------  RADIOLOGY:  No results found.   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 Nausea meds Continue sandostatin. GI consulted. Patient is at high risk for dehydration and try to balance with her significant diarrhea.  * AKI over CKD3 Due to dehydration. Resolved  * Endometrial carcinoma OP Oncology f/u  * Hypomagnesemia Started PO MgOx 400mg  daily  * DVT prophylaxis Lovenox  All the records are reviewed and case discussed with Care Management/Social Workerr. Management plans discussed with the patient, family and they are in agreement.  CODE STATUS: FULL  DVT Prophylaxis: SCDs  TOTAL TIME TAKING CARE OF THIS PATIENT: 30 minutes.   POSSIBLE D/C IN 1-2 DAYS, DEPENDING ON CLINICAL CONDITION.  [name] R M.D on 03/22/2016 at  1:36 PM  Between 7am to 6pm - Pager - [phone]  After 6pm go to [email or web address] - password [email or web address] [facility]  Office  [phone]  CC: Primary care physician; [name], MD  Note: This dictation was prepared with Dragon dictation along with smaller phrase technology. Any  transcriptional errors that result from this process are unintentional.

## 2016-03-22 NOTE — Consult Note (Signed)
Heather Lame, MD [name] [name], [name] [address] Phone: [phone] Fax : [phone]  Consultation  Referring Provider:     No ref. provider found Primary Care Physician:  [name], MD Primary Gastroenterologist:  None         Reason for Consultation:     Diarrhea  Date of Admission:  03/18/2016 Date of Consultation:  03/22/2016         HPI:   [name] is a 70 y.o. female Who was admitted with diarrhea.  The patient was diagnosed with endometrial cancer.  The patient has had a problem with watery diarrhea from her Colostomy.  The patient was seen by her surgeon as an outpatient and was told that she should take Lomotil.  She did not want to start Lomotil and continued to have diarrhea.  She states the diarrhea started in June.  She denies ever having diarrhea in the past like this.  The patient states that she has a lot of intestinal grumbling when she has her diarrhea.  The patient was started on octreotide and reports that the diarrhea has improved but not gone away.  The patient was also started on Lomotil during this admission.  The patient's history includes diverticulitis and a colonoscopy in 2010 and 2015.  The patient is very anxious to go home and reports that she has been feeling much better.  The patient also reports that she belie the diarrhea started.  She denies the diarrhea being malodorous or fatty.  Past Medical History  Diagnosis Date  . Diverticulitis of colon (without mention of hemorrhage)   . Mechanical complication of colostomy and enterostomy   . Cancer [facility]) 2011    endometrial  . Personal history of tobacco use, presenting hazards to health   . History of kidney stones 2011  . Obesity, unspecified   . Chronic kidney disease     STONES    Past Surgical History  Procedure Laterality Date  . Appendectomy    . Lithotripsy  2009  . Tubal ligation    . Pyelolithotomy    . Colostomy  2011  . Abdominal hysterectomy  2011    . Dilation and curettage of uterus    . Mole removal  2003  . Hysteroscopy  2011  . Colonoscopy  June 2010,03/31/14    Tubular adenoma of the transverse colon, hyperplastic polyps of the sigmoid.  . 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 Colon surgery  05/20/1999    Drainage of pelvic abscess, end colostomy  . Colon surgery  11/15/2010    Revision of colostomy stoma  . Skin cancer removal  02/2014  . Hernia repair  04/06/14    ventral hernia, tetro rectus Ventralex ST mesh, 20 x 33 cm.   . Cholecystectomy  05/07/14    Incidental during ventral hernia repair.   . Portacath placement N/A 03/01/2016    Procedure: INSERTION PORT-A-CATH;  Surgeon: [name], MD;  Location: [facility] ORS;  Service: General;  Laterality: N/A;    Prior to Admission medications   Medication Sig Start Date End Date Taking? Authorizing Provider  ALPRAZolam [name]) 0.25 MG tablet Take 0.25 mg by mouth at bedtime as needed for anxiety.  02/09/16  Yes Historical Provider, MD  diphenoxylate-atropine (LOMOTIL) 2.5-0.025 MG tablet Take 1 tablet by mouth 4 (four) times daily as needed for diarrhea or loose stools. 03/19/16  [name], MD  loperamide ([name] A-D) 2 MG tablet Take 1 tablet (2 mg total) by mouth 4 (four) times daily as needed for diarrhea or loose stools. 03/19/16   [name], MD  potassium chloride 20 MEQ TBCR Take 20 mEq by mouth 2 (two) times daily. 03/22/16   [name], MD    Family History  Problem Relation Age of Onset  . Breast cancer Maternal Grandmother   . Colon cancer Mother   . Cancer Mother      Social History  Substance Use Topics  . Smoking status: Current Every Day Smoker -- 0.25 packs/day for 15 years    Types: Cigarettes  . Smokeless tobacco: Never Used  . Alcohol Use: No    Allergies as of 03/18/2016  . (No Known Allergies)    Review of Systems:    All systems reviewed and negative  except where noted in HPI.   Physical Exam:  Vital signs in last 24 hours: Temp:  [97.5 F (36.4 C)-98.6 F (37 C)] 97.5 F (36.4 C) (07/20 1350) Pulse Rate:  [81-84] 81 (07/20 1350) Resp:  [16-20] 18 (07/20 1350) BP: (116-121)/(72-77) 121/72 mmHg (07/20 1350) SpO2:  [97 %-99 %] 99 % (07/20 1350) Last BM Date: 03/22/16 General:   Pleasant, cooperative in NAD Head:  Normocephalic and atraumatic. Eyes:   No icterus.   Conjunctiva pink. PERRLA. Ears:  Normal auditory acuity. Neck:  Supple; no masses or thyroidomegaly Lungs: Respirations even and unlabored. Lungs clear to auscultation bilaterally.   No wheezes, crackles, or rhonchi.  Heart:  Regular rate and rhythm;  Without murmur, clicks, rubs or gallops Abdomen:  Soft, nondistended, nontender. Normal bowel sounds. No appreciable masses or hepatomegaly.  No rebound or guarding. Colostomy in place. Rectal:  Not performed. Msk:  Symmetrical without gross deformities.   Extremities:  Without edema, cyanosis or clubbing. Neurologic:  Alert and oriented x3;  grossly normal neurologically. Skin:  Intact without significant lesions or rashes. Cervical Nodes:  No significant cervical adenopathy. Psych:  Alert and cooperative. Normal affect.  LAB RESULTS: No results for input(s): WBC, HGB, HCT, PLT in the last 72 hours. BMET  Recent Labs  03/20/16 0543 03/21/16 0500 03/22/16 1023 03/22/16 1611  NA 139 137 138  --   K 3.7 3.8 2.9* 3.1*  CL 116* 114* 115*  --   CO2 18* 16* 15*  --   GLUCOSE 87 97 111*  --   BUN 33* 26* 21*  --   CREATININE 1.13* 1.32* 1.27*  --   CALCIUM 7.3* 7.5* 8.1*  --    LFT No results for input(s): PROT, ALBUMIN, AST, ALT, ALKPHOS, BILITOT, BILIDIR, IBILI in the last 72 hours. PT/INR No results for input(s): LABPROT, INR in the last 72 hours.  STUDIES: No results found.    Impression / Plan:   [name] is a 70 y.o. y/o female with a history of stage III endometrial cancer. The patient's  diarrhea has improved with octreotide.  The patient has been told that her diarrhea may be caused by microscopic colitis versus bacterial overgrowth or may be paraneoplastic in nature. The patient has been encouraged to have a  Colonoscopy.  She states that she would like to go home and have it done as an outpatient.  The patient has been given my business  And told to call my office when she is discharged. The patient has been explained the plan and agrees with it  Thank you for involving me  in the care of this patient.      LOS: 4 days   Heather Lame, MD  03/22/2016, 8:02 PM   Note: This dictation was prepared with Dragon dictation along with smaller phrase technology. Any transcriptional errors that result from this process are unintentional.

## 2016-03-22 NOTE — Progress Notes (Signed)
Changed pt colostomy BID during shift. Pt stoma is size 1 and 1/4 inch. Pt tolerated care. No other signs of distress noted. Will continue to monitor.

## 2016-03-22 NOTE — Progress Notes (Signed)
[facility] Hematology/Oncology Progress Note  Date of admission: 03/18/2016  Hospital day:  03/22/2016  Chief Complaint: [name] is a 70 y.o. female who was admitted on day 12 s/p cycle #1 carboplatin and Taxol with acute renal insufficiency,   Subjective:  Octreotide helping.  Nauseated this morning.  Social History: The patient is alone today.    Allergies: No Known Allergies  Scheduled Medications: . cholestyramine  4 g Oral BID  . enoxaparin (LOVENOX) injection  40 mg Subcutaneous Q24H  . magnesium oxide  400 mg Oral Daily  . magnesium sulfate 1 - 4 g bolus IVPB  2 g Intravenous Once  . octreotide  100 mcg Subcutaneous Q8H  . sertraline  50 mg Oral [id number]  . sodium chloride flush  3 mL Intravenous Q12H    Review of Systems: GENERAL: Feels a little better. No fevers, sweats or weight loss. PERFORMANCE STATUS (ECOG): 2 HEENT: No visual changes, runny nose, sore throat, mouth sores or tenderness. Lungs: No shortness of breath or cough. No hemoptysis. Cardiac: No chest pain, palpitations, orthopnea, or PND. GI: Nausea. Liquid stool output from ostomy, improved with octreotide. Crampy abdominal pain. No vomiting, constipation, melena or hematochezia. GU: No urgency, frequency, dysuria, or hematuria. Musculoskeletal: No back pain. No joint pain. No muscle tenderness. Extremities: No pain or swelling. Skin: No rashes or skin changes. Neuro: No headache, numbness or weakness, balance or coordination issues. Endocrine: No diabetes, thyroid issues, hot flashes or night sweats. Psych: No mood changes, depression or anxiety. Pain: No focal pain. Review of systems: All other systems reviewed and found to be negative.  Physical Exam: Blood pressure 116/77, pulse 83, temperature 97.8 F (36.6 C), temperature source Oral, resp. rate 16, height 5' 2"  (1.575 m), weight 150 lb (68.04 kg), SpO2 97 %.  GENERAL: Well developed, well  nourished, sitting comfortably on the medical unit in no acute distress.  She is nauseous. MENTAL STATUS: Alert and oriented to person, place and time. HEAD: Short gray hair. Normocephalic, atraumatic, face symmetric, no Cushingoid features. EYES: Blue eyes. Pupils equal round and reactive to light and accomodation. No conjunctivitis or scleral icterus. ENT: Oropharynx clear without lesion. Tongue normal. Mucous membranes moist.  RESPIRATORY: Clear to auscultation without rales, wheezes or rhonchi. CARDIOVASCULAR: Regular rate and rhythm without murmur, rub or gallop. ABDOMEN: Colostomy. Soft, non-tender, with active bowel sounds, and no hepatosplenomegaly. No masses. SKIN: No rashes, ulcers or lesions. EXTREMITIES: No edema, no skin discoloration or tenderness. No palpable cords. LYMPH NODES: No palpable cervical, supraclavicular, axillary or inguinal adenopathy  NEUROLOGICAL: Unremarkable. PSYCH: Appropriate.   Results for orders placed or performed during the hospital encounter of 03/18/16 (from the past 48 hour(s))  Basic metabolic panel     Status: Abnormal   Collection Time: 03/21/16  5:00 AM  Result Value Ref Range   Sodium 137 135 - 145 mmol/L   Potassium 3.8 3.5 - 5.1 mmol/L    Comment: HEMOLYSIS AT THIS LEVEL MAY AFFECT RESULT   Chloride 114 (H) 101 - 111 mmol/L   CO2 16 (L) 22 - 32 mmol/L   Glucose, Bld 97 65 - 99 mg/dL   BUN 26 (H) 6 - 20 mg/dL   Creatinine, Ser 1.32 (H) 0.44 - 1.00 mg/dL   Calcium 7.5 (L) 8.9 - 10.3 mg/dL   GFR calc non Af Amer 40 (L) >60 mL/min   GFR calc Af Amer 47 (L) >60 mL/min    Comment: (NOTE) The eGFR has been calculated  using the CKD EPI equation. This calculation has not been validated in all clinical situations. eGFR's persistently <60 mL/min signify possible Chronic Kidney Disease.    Anion gap 7 5 - 15  Magnesium     Status: Abnormal   Collection Time: 03/21/16  5:00 AM  Result Value Ref Range   Magnesium 1.6 (L) 1.7  - 2.4 mg/dL  Phosphorus     Status: None   Collection Time: 03/21/16  5:00 AM  Result Value Ref Range   Phosphorus 3.4 2.5 - 4.6 mg/dL   No results found.  Assessment:  [name] is a 70 y.o. female with with recurrent endometrial cancer admitted on day 12 s/p cycle #1 carboplatin and Taxol secondary to acute renal insufficiency secondary to dehydration from poor oral inake, nausea/vomiting, and significant ostomy output. She has been rehydrated with improvement in her creatinine from 2.29 to 1.51. Potassium and magnesium are being repleted.  Plan:  1. Oncology: Day 16 s/p cycle #1 carboplatin and Taxol. Little more nausea this morning.  Ondansetron every 8 hours prn. Cycle #2 planned for 03/28/2016.  2. Renal: IVF continue.  Slight increase creatinine 1.13 to 1.32 overnight.  Bicarb decreased likely secondary to GI losses.  Magnesium slightly low.  Potassium normal.  3. Gastroenterology: Patient notes improvement in GI output with Octreotide injections every 8 hours. She notes that 2 hours after an injection, she has no stool output for 4 hours then back to baseline output.  She notes "decreased rumbling".  Unclear if Questran helping.  Dr. Allen Norris consulted.  Patient may benefit from long acting Octreotide (monthly).  Stool output unaffected by at home use of [name]. Lomotil prn.   4. Disposition: Anticipate discharge soon.  Await GI recommendations.   Lequita Asal, MD  03/22/2016, 8:25 AM

## 2016-03-22 NOTE — Plan of Care (Signed)
Problem: Urinary Elimination: Goal: Progression of disease will be identified and treated Outcome: Not Progressing Pt cont to have loose watery stools via colostomy. sandostatin cont and pt started lomotil q6hs atc.  colos bag intact. Leaked  Earlier and  [employer] and bag changed. Site  Cont  Irritated and reddened around site but cleansed and barrier cream applied at site.

## 2016-03-23 LAB — CULTURE, BLOOD (ROUTINE X 2)
Culture: NO GROWTH
Culture: NO GROWTH

## 2016-03-23 LAB — BASIC METABOLIC PANEL
Anion gap: 7 (ref 5–15)
BUN: 20 mg/dL (ref 6–20)
CO2: 16 mmol/L — ABNORMAL LOW (ref 22–32)
Calcium: 8.3 mg/dL — ABNORMAL LOW (ref 8.9–10.3)
Chloride: 117 mmol/L — ABNORMAL HIGH (ref 101–111)
Creatinine, Ser: 1.29 mg/dL — ABNORMAL HIGH (ref 0.44–1.00)
GFR calc Af Amer: 48 mL/min — ABNORMAL LOW (ref >60)
GFR, EST NON AFRICAN AMERICAN: 41 mL/min — AB (ref >60)
GLUCOSE: 105 mg/dL — AB (ref 65–99)
POTASSIUM: 3.8 mmol/L (ref 3.5–5.1)
Sodium: 140 mmol/L (ref 135–145)

## 2016-03-23 MED ORDER — OCTREOTIDE ACETATE 100 MCG/ML IJ SOLN: 100.0000 ug | Freq: Three times a day (TID) | INTRAMUSCULAR | Status: DC

## 2016-03-23 MED ORDER — SYRINGE (DISPOSABLE) 1 ML MISC: 1.0000 [IU] | Freq: Three times a day (TID) | Status: DC

## 2016-03-23 NOTE — Care Management Note (Addendum)
Case Management Note  Patient Details  Name: [name] MRN: [id number] Date of Birth: 12/14/1945  Subjective/Objective:                 Spoke with patient and husband in room. They state that two of their daughters are nurses and she is a retired Marine scientist as well. They decline HH.   Action/Plan:  Patient provided Sandostatin supply to last through Tuesday. Patient and husband understand to get syringes filled at outpatient pharmacy today. Authorization obtained through cancer center. Cancer center notified [place] [facility] to order medication.   Expected Discharge Date:                  Expected Discharge Plan:  Home/Self Care  In-House Referral:     Discharge planning Services  CM Consult  Post Acute Care Choice:  NA Choice offered to:  NA  DME Arranged:  N/A DME Agency:  NA  HH Arranged:  NA HH Agency:  NA  Status of Service:  Completed, signed off  If discussed at [place] of Stay Meetings, dates discussed:    Additional Comments:  Carles Collet, RN 03/23/2016, 12:33 PM

## 2016-03-23 NOTE — Progress Notes (Signed)
Discharge paperwork reviewed with patient and husband who verbalized understanding. Prescriptions given to patient.   Spoke with case manager, Jackelyn Poling, who is waiting to hear back from Dr. Mike Gip about getting Sandostatin medication from hospital pharmacy. I explained to patient that it is important for patient to wait for the medication since her pharmacy is unable to get until Monday. Patient is very upset that she cannot go right now. I explained it is for her health and wellbeing to wait for medication before going home.   Patient requesting to sit outside for a moment to get some fresh air with her husband.

## 2016-03-23 NOTE — Progress Notes (Addendum)
Called in Sandostatin to [facility] for price check, awaiting call back. Pharmacist states that they would not be able to provide medication until Monday when they could get it in stock. Dr Darvin Neighbours updated.  Addendum- Medication requires prior auth. MD please call [phone]. Phone number and ID# [id number] given to Dr Mike Gip to obtain authorization.   Spoke with Youlanda Mighty, inpatient pharmacy. She states that the hospital can provide enough medication to cover until the patient's outpatient pharmacy can get it in stock. MD will need to write Rx for exact amount of vials needed. CM or RN can fill Rx at pharmacy window on ground floor. MD will need to provide Rx for subQ syringes to administer medication, patient will need to get this filled at her outpatient pharmacy, syringes are not stocked by hospital pharmacy.

## 2016-03-23 NOTE — Progress Notes (Signed)
Pharmacy Consult for Electrolyte Monitoring and Replacement  No Known Allergies  Patient Measurements: Height: 5\' 2"  (157.5 cm) Weight: 150 lb (68.04 kg) IBW/kg (Calculated) : 50.1  Vital Signs: Temp: 98 F (36.7 C) (07/21 0423) Temp Source: Oral (07/21 0423) BP: 125/71 mmHg (07/21 0423) Pulse Rate: 86 (07/21 0423) Intake/Output from previous day: 07/20 0701 - 07/21 0700 In: 600 [P.O.:600] Out: 600 [Stool:600]  Labs: No results for input(s): WBC, HGB, HCT, PLT, APTT, INR in the last 72 hours.   Recent Labs  03/21/16 0500 03/22/16 1023 03/22/16 1611 03/23/16 0509  NA 137 138  --  140  K 3.8 2.9* 3.1* 3.8  CL 114* 115*  --  117*  CO2 16* 15*  --  16*  GLUCOSE 97 111*  --  105*  BUN 26* 21*  --  20  CREATININE 1.32* 1.27*  --  1.29*  CALCIUM 7.5* 8.1*  --  8.3*  MG 1.6*  --   --   --   PHOS 3.4  --   --   --    Estimated Creatinine Clearance: 37.2 mL/min (by C-G formula based on Cr of 1.29).   No results for input(s): GLUCAP in the last 72 hours.   Assessment: Pharmacy consulted to assist in managing and replacing electrolytes in this 70 y/o F admitted with weakness and dehydration, diarrhea. Patient received magnesium 2g IV x 1 on 7/18.    Plan:  7/19- Will order magnesium 2g IV x 1. MD started MagOx 400mg  daily.  7/20- MD ordered KDUR 40 meq po x 2 doses.  7/21- K=3.8. Will recheck labs in am along with Mag level. Patient still with Diarrhea (on loperamide scheduled, octreotide, cholestyramine).  Pharmacy will continue to monitor and adjust per consult.    Chinita Greenland PharmD Clinical Pharmacist 03/23/2016  7:59 AM

## 2016-03-23 NOTE — Progress Notes (Signed)
Dr. Darvin Neighbours notified patient still having diarrhea output but better than yesterday. MD denies need for C. Diff stool sample. Currently taking Immodium and Sandostatin to help w/ diarrhea. Will continue to monitor.

## 2016-03-24 NOTE — Discharge Summary (Signed)
[facility] [place] NAME: [name]    MR#:  [id number]  DATE OF BIRTH:  Apr 18, 1946  DATE OF ADMISSION:  03/18/2016 ADMITTING PHYSICIAN: Lytle Butte, MD  DATE OF DISCHARGE: 03/23/2016  4:25 PM  PRIMARY CARE PHYSICIAN: [name], MD   ADMISSION DIAGNOSIS:  Dehydration [E86.0] Hypokalemia [E87.6] Hypomagnesemia [E83.42] Acute kidney injury ([place]) [N17.9]  DISCHARGE DIAGNOSIS:  Principal Problem:   Acute renal failure (ARF) (HCC) Active Problems:   Dehydration   Diarrhea   SECONDARY DIAGNOSIS:   Past Medical History  Diagnosis Date  . Diverticulitis of colon (without mention of hemorrhage)   . Mechanical complication of colostomy and enterostomy   . Cancer [facility]) 2011    endometrial  . Personal history of tobacco use, presenting hazards to health   . History of kidney stones 2011  . Obesity, unspecified   . Chronic kidney disease     STONES     ADMITTING HISTORY  [name] is a 70 y.o. female with a known history of Uterine cancer on chemotherapy follows with Dr. Mike Gip of oncology who is presenting with progressive weakness in the setting of persistent nausea, vomiting of nonbloody nonbilious emesis, increased ostomy output. she states that she's had these symptoms for a few days and has been progressively feeling run down. She also describes having a presyncopal episode with associated palpitations which sounds orthostatic in nature. 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 Nausea meds Seen by Dr. Mike Gip of oncology and Dr. Allen Norris of GI. Patient was started on  subcutaneous octreotide with which her diarrhea improved well. Patient is discharged home on 3 times a day subcutaneous octreotide and will follow-up with Dr. Mike Gip at the cancer center for a Depo octreotide shot in 1 week.  * AKI over CKD3 Due to dehydration. Resolved  * Endometrial carcinoma OP Oncology f/u  * Hypomagnesemia Started PO MgOx 400mg  daily  * DVT prophylaxis Lovenox  Stable for discharge home.  CONSULTS OBTAINED:  Treatment Team:  Lytle Butte, MD Lloyd Huger, MD [name], MD  DRUG ALLERGIES:  No Known Allergies  DISCHARGE MEDICATIONS:   Discharge Medication List as of 03/23/2016 12:38 PM    START taking these medications   Details  diphenoxylate-atropine (LOMOTIL) 2.5-0.025 MG tablet Take 1 tablet by mouth 4 (four) times daily as needed for diarrhea or loose stools., Starting 03/19/2016, Until Discontinued, Print    loperamide ([name] A-D) 2 MG tablet Take 1 tablet (2 mg total) by mouth 4 (four) times daily as needed for diarrhea or loose stools., Starting 03/19/2016, Until Discontinued, Print    octreotide (SANDOSTATIN) 100 MCG/ML SOLN injection Inject 1 mL (100 mcg total) into the skin every 8 (eight) hours., Starting 03/23/2016, Until Discontinued, Print      CONTINUE these medications which have CHANGED   Details  potassium chloride 20 MEQ TBCR Take 20 mEq by mouth 2 (two) times daily., Starting 03/22/2016, Until Discontinued, Print      CONTINUE these medications which have NOT CHANGED   Details  ALPRAZolam (XANAX) 0.25 MG tablet Take 0.25 mg by mouth at bedtime as needed for anxiety. , Starting 02/09/2016, Until Discontinued, Historical Med  STOP taking these medications     sertraline (ZOLOFT) 50 MG tablet         Today   VITAL SIGNS:  Blood pressure 130/84, pulse 85, temperature 98.3 F (36.8 C), temperature source Oral, resp. rate 16, height 5\' 2"  (1.575 m), weight 68.04 kg (150 lb), SpO2 96 %.  I/O:  No intake or  output data in the 24 hours ending 03/24/16 1459  PHYSICAL EXAMINATION:  Physical Exam  GENERAL:  70 y.o.-year-old patient lying in the bed with no acute distress.  LUNGS: Normal breath sounds bilaterally, no wheezing, rales,rhonchi or crepitation. No use of accessory muscles of respiration.  CARDIOVASCULAR: S1, S2 normal. No murmurs, rubs, or gallops.  ABDOMEN: Soft, non-tender, non-distended. Bowel sounds present. No organomegaly or mass. Colosctomy bag in place NEUROLOGIC: Moves all 4 extremities. PSYCHIATRIC: The patient is alert and oriented x 3.  SKIN: No obvious rash, lesion, or ulcer.   DATA REVIEW:   CBC  Recent Labs Lab 03/19/16 0558  WBC 3.8  HGB 11.2*  HCT 32.6*  PLT 184    Chemistries   Recent Labs Lab 03/18/16 1030  03/21/16 0500  03/23/16 0509  NA 134*  < > 137  < > 140  K 2.8*  < > 3.8  < > 3.8  CL 98*  < > 114*  < > 117*  CO2 21*  < > 16*  < > 16*  GLUCOSE 112*  < > 97  < > 105*  BUN 73*  < > 26*  < > 20  CREATININE 2.29*  < > 1.32*  < > 1.29*  CALCIUM 7.6*  < > 7.5*  < > 8.3*  MG 1.5*  < > 1.6*  --   --   AST 30  --   --   --   --   ALT 17  --   --   --   --   ALKPHOS 105  --   --   --   --   BILITOT 0.7  --   --   --   --   < > = values in this interval not displayed.  Cardiac Enzymes  Recent Labs Lab 03/18/16 1030  TROPONINI 0.04*    Microbiology Results  Results for orders placed or performed during the hospital encounter of 03/18/16  Culture, blood (Routine X 2) w Reflex to ID Panel     Status: None   Collection Time: 03/18/16 10:44 AM  Result Value Ref Range Status   Specimen Description BLOOD RIGHT ARM  Final   Special Requests BOTTLES DRAWN AEROBIC AND ANAEROBIC  8CC  Final   Culture NO GROWTH 5 DAYS  Final   Report Status 03/23/2016 FINAL  Final  Culture, blood (Routine X 2) w Reflex to ID Panel     Status: None   Collection Time: 03/18/16 10:44 AM  Result Value Ref Range Status   Specimen Description BLOOD RIGHT PORTA CATH  CHEST  Final   Special Requests BOTTLES DRAWN AEROBIC AND ANAEROBIC  [place]  Final   Culture NO GROWTH 5 DAYS  Final   Report Status 03/23/2016 FINAL  Final  Urine culture     Status: Abnormal   Collection Time: 03/18/16 10:44 AM  Result Value Ref Range Status   Specimen Description URINE, RANDOM  Final   Special Requests NONE  Final   Culture MULTIPLE SPECIES PRESENT, SUGGEST RECOLLECTION (A)  Final   Report Status 03/20/2016 FINAL  Final  RADIOLOGY:  No results found.  Follow up with PCP in 1 week.  Management plans discussed with the patient, family and they are in agreement.  CODE STATUS:  Code Status History    Date Active Date Inactive Code Status Order ID Comments User Context   03/18/2016 11:46 AM 03/23/2016  7:42 PM Full Code [id number]  Lytle Butte, MD ED      TOTAL TIME TAKING CARE OF THIS PATIENT ON DAY OF DISCHARGE: more than 30 minutes.   [name] R M.D on 03/24/2016 at 2:59 PM  Between 7am to 6pm - Pager - [phone]  After 6pm go to [email or web address] - password [email or web address] [facility]  Office  [phone]  CC: Primary care physician; [name], MD  Note: This dictation was prepared with Dragon dictation along with smaller phrase technology. Any transcriptional errors that result from this process are unintentional.

## 2016-03-25 ENCOUNTER — Inpatient Hospital Stay
Admission: EM | Admit: 2016-03-25 | Discharge: 2016-03-28 | DRG: 682 | Disposition: A | Discharge status: Home / Self Care | Payer: Medicare Other | Attending: Internal Medicine | Admitting: Internal Medicine

## 2016-03-25 ENCOUNTER — Encounter: Payer: Self-pay | Admitting: Emergency Medicine

## 2016-03-25 DIAGNOSIS — M79662 Pain in left lower leg: Secondary | ICD-10-CM

## 2016-03-25 DIAGNOSIS — Z800 Family history of malignant neoplasm of digestive organs: Secondary | ICD-10-CM | POA: Diagnosis not present

## 2016-03-25 DIAGNOSIS — J9811 Atelectasis: Secondary | ICD-10-CM | POA: Diagnosis present

## 2016-03-25 DIAGNOSIS — E872 Acidosis, unspecified: Secondary | ICD-10-CM

## 2016-03-25 DIAGNOSIS — J9601 Acute respiratory failure with hypoxia: Secondary | ICD-10-CM | POA: Diagnosis present

## 2016-03-25 DIAGNOSIS — N390 Urinary tract infection, site not specified: Secondary | ICD-10-CM | POA: Diagnosis present

## 2016-03-25 DIAGNOSIS — Z803 Family history of malignant neoplasm of breast: Secondary | ICD-10-CM

## 2016-03-25 DIAGNOSIS — R197 Diarrhea, unspecified: Secondary | ICD-10-CM | POA: Diagnosis present

## 2016-03-25 DIAGNOSIS — R8271 Bacteriuria: Secondary | ICD-10-CM | POA: Diagnosis present

## 2016-03-25 DIAGNOSIS — R0902 Hypoxemia: Secondary | ICD-10-CM

## 2016-03-25 DIAGNOSIS — F17210 Nicotine dependence, cigarettes, uncomplicated: Secondary | ICD-10-CM | POA: Diagnosis present

## 2016-03-25 DIAGNOSIS — E669 Obesity, unspecified: Secondary | ICD-10-CM | POA: Diagnosis present

## 2016-03-25 DIAGNOSIS — E860 Dehydration: Secondary | ICD-10-CM | POA: Diagnosis present

## 2016-03-25 DIAGNOSIS — Z90710 Acquired absence of both cervix and uterus: Secondary | ICD-10-CM

## 2016-03-25 DIAGNOSIS — N179 Acute kidney failure, unspecified: Principal | ICD-10-CM | POA: Diagnosis present

## 2016-03-25 DIAGNOSIS — Z8542 Personal history of malignant neoplasm of other parts of uterus: Secondary | ICD-10-CM

## 2016-03-25 DIAGNOSIS — R8281 Pyuria: Secondary | ICD-10-CM

## 2016-03-25 DIAGNOSIS — R000 Tachycardia, unspecified: Secondary | ICD-10-CM | POA: Diagnosis present

## 2016-03-25 DIAGNOSIS — Z9851 Tubal ligation status: Secondary | ICD-10-CM

## 2016-03-25 DIAGNOSIS — N189 Chronic kidney disease, unspecified: Secondary | ICD-10-CM | POA: Diagnosis present

## 2016-03-25 DIAGNOSIS — Z9049 Acquired absence of other specified parts of digestive tract: Secondary | ICD-10-CM

## 2016-03-25 DIAGNOSIS — Z87442 Personal history of urinary calculi: Secondary | ICD-10-CM | POA: Diagnosis not present

## 2016-03-25 LAB — C DIFFICILE QUICK SCREEN W PCR REFLEX
C DIFFICLE (CDIFF) ANTIGEN: NEGATIVE
C Diff interpretation: NOT DETECTED
C Diff toxin: NEGATIVE

## 2016-03-25 LAB — COMPREHENSIVE METABOLIC PANEL
ALT: 25 U/L (ref 14–54)
AST: 35 U/L (ref 15–41)
Albumin: 2.3 g/dL — ABNORMAL LOW (ref 3.5–5.0)
Alkaline Phosphatase: 175 U/L — ABNORMAL HIGH (ref 38–126)
Anion gap: 11 (ref 5–15)
BUN: 37 mg/dL — AB (ref 6–20)
CHLORIDE: 115 mmol/L — AB (ref 101–111)
CO2: 13 mmol/L — AB (ref 22–32)
CREATININE: 2.15 mg/dL — AB (ref 0.44–1.00)
Calcium: 8 mg/dL — ABNORMAL LOW (ref 8.9–10.3)
GFR calc Af Amer: 26 mL/min — ABNORMAL LOW (ref >60)
GFR calc non Af Amer: 22 mL/min — ABNORMAL LOW (ref >60)
Glucose, Bld: 120 mg/dL — ABNORMAL HIGH (ref 65–99)
Potassium: 3.9 mmol/L (ref 3.5–5.1)
SODIUM: 139 mmol/L (ref 135–145)
Total Bilirubin: 0.3 mg/dL (ref 0.3–1.2)
Total Protein: 5.5 g/dL — ABNORMAL LOW (ref 6.5–8.1)

## 2016-03-25 LAB — CBC
HCT: 35.1 % (ref 35.0–47.0)
Hemoglobin: 11.9 g/dL — ABNORMAL LOW (ref 12.0–16.0)
MCH: 32.7 pg (ref 26.0–34.0)
MCHC: 34 g/dL (ref 32.0–36.0)
MCV: 96.3 fL (ref 80.0–100.0)
PLATELETS: 247 10*3/uL (ref 150–440)
RBC: 3.65 MIL/uL — ABNORMAL LOW (ref 3.80–5.20)
RDW: 14.2 % (ref 11.5–14.5)
WBC: 12.6 10*3/uL — ABNORMAL HIGH (ref 3.6–11.0)

## 2016-03-25 LAB — URINALYSIS COMPLETE WITH MICROSCOPIC (ARMC ONLY)
BILIRUBIN URINE: NEGATIVE
Bacteria, UA: NONE SEEN
GLUCOSE, UA: NEGATIVE mg/dL
Ketones, ur: NEGATIVE mg/dL
Nitrite: NEGATIVE
PH: 5 (ref 5.0–8.0)
Protein, ur: 30 mg/dL — AB
SPECIFIC GRAVITY, URINE: 1.015 (ref 1.005–1.030)

## 2016-03-25 LAB — TYPE AND SCREEN
ABO/RH(D): O POS
Antibody Screen: NEGATIVE

## 2016-03-25 LAB — MAGNESIUM: Magnesium: 1.8 mg/dL (ref 1.7–2.4)

## 2016-03-25 LAB — OCCULT BLOOD X 1 CARD TO LAB, STOOL: FECAL OCCULT BLD: POSITIVE — AB

## 2016-03-25 LAB — LIPASE, BLOOD: LIPASE: 42 U/L (ref 11–51)

## 2016-03-25 LAB — PHOSPHORUS: PHOSPHORUS: 2.6 mg/dL (ref 2.5–4.6)

## 2016-03-25 MED ORDER — POTASSIUM CHLORIDE CRYS ER 20 MEQ PO TBCR
20.0000 meq | EXTENDED_RELEASE_TABLET | Freq: Two times a day (BID) | ORAL | Status: DC
  Administered 2016-03-25 – 2016-03-26 (×3): 20 meq via ORAL
  Filled 2016-03-25 (×3): qty 1

## 2016-03-25 MED ORDER — ACETAMINOPHEN 650 MG RE SUPP: 650.0000 mg | Freq: Four times a day (QID) | RECTAL | Status: DC | PRN

## 2016-03-25 MED ORDER — DIPHENOXYLATE-ATROPINE 2.5-0.025 MG PO TABS
1.0000 | ORAL_TABLET | Freq: Four times a day (QID) | ORAL | Status: DC | PRN
  Administered 2016-03-25: 1 via ORAL
  Filled 2016-03-25: qty 1

## 2016-03-25 MED ORDER — ONDANSETRON HCL 4 MG/2ML IJ SOLN
4.0000 mg | Freq: Four times a day (QID) | INTRAMUSCULAR | Status: DC | PRN
  Administered 2016-03-25: 4 mg via INTRAVENOUS
  Filled 2016-03-25: qty 2

## 2016-03-25 MED ORDER — ACETAMINOPHEN 325 MG PO TABS: 650.0000 mg | ORAL_TABLET | Freq: Four times a day (QID) | ORAL | Status: DC | PRN

## 2016-03-25 MED ORDER — OCTREOTIDE ACETATE 100 MCG/ML IJ SOLN
100.0000 ug | Freq: Three times a day (TID) | INTRAMUSCULAR | Status: DC
  Administered 2016-03-26 – 2016-03-28 (×7): 100 ug via SUBCUTANEOUS
  Filled 2016-03-25 (×9): qty 1

## 2016-03-25 MED ORDER — SODIUM CHLORIDE 0.9 % IV SOLN
INTRAVENOUS | Status: DC
  Administered 2016-03-25 – 2016-03-27 (×6): via INTRAVENOUS

## 2016-03-25 MED ORDER — OCTREOTIDE ACETATE 100 MCG/ML IJ SOLN
100.0000 ug | Freq: Once | INTRAMUSCULAR | Status: AC
Start: 2016-03-25 — End: 2016-03-25
  Administered 2016-03-25: 100 ug via SUBCUTANEOUS
  Filled 2016-03-25: qty 1

## 2016-03-25 MED ORDER — LOPERAMIDE HCL 2 MG PO CAPS: 2.0000 mg | ORAL_CAPSULE | Freq: Four times a day (QID) | ORAL | Status: DC | PRN

## 2016-03-25 MED ORDER — SODIUM CHLORIDE 0.9 % IV BOLUS (SEPSIS)
1000.0000 mL | Freq: Once | INTRAVENOUS | Status: AC
  Administered 2016-03-25: 1000 mL via INTRAVENOUS

## 2016-03-25 MED ORDER — ALPRAZOLAM 0.25 MG PO TABS
0.2500 mg | ORAL_TABLET | Freq: Every evening | ORAL | Status: DC | PRN
  Administered 2016-03-26: 0.25 mg via ORAL
  Filled 2016-03-25: qty 1

## 2016-03-25 NOTE — H&P (Signed)
[name] is an 70 y.o. female.   Chief Complaint: Weakness HPI: Just discharged from hospital 2 days ago for same issues. Did well at first then today had nausea and vomiting, palpitations and felt weak. Found to be in acute renal failure and tachycardic in ED.  Past Medical History:  Diagnosis Date  . Cancer [facility]) 2011   endometrial  . Chronic kidney disease    STONES  . Diverticulitis of colon (without mention of hemorrhage)   . History of kidney stones 2011  . Mechanical complication of colostomy and enterostomy   . Obesity, unspecified   . Personal history of tobacco use, presenting hazards to health     Past Surgical History:  Procedure Laterality Date  . ABDOMINAL HYSTERECTOMY  2011  . APPENDECTOMY    . CHOLECYSTECTOMY  05/07/14   Incidental during ventral hernia repair.   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 COLON SURGERY  05/20/1999   Drainage of pelvic abscess, end colostomy  . COLON SURGERY  11/15/2010   Revision of colostomy stoma  . COLONOSCOPY  June 2010,03/31/14   Tubular adenoma of the transverse colon, hyperplastic polyps of the sigmoid.  Marland Kitchen COLOSTOMY  2011  . DILATION AND CURETTAGE OF UTERUS    . HERNIA REPAIR  04/06/14   ventral hernia, tetro rectus Ventralex ST mesh, 20 x 33 cm.   Marland Kitchen HYSTEROSCOPY  2011  . LITHOTRIPSY  2009  . MOLE REMOVAL  2003  . PORTACATH PLACEMENT N/A 03/01/2016   Procedure: INSERTION PORT-A-CATH;  Surgeon: [name], MD;  Location: [facility] ORS;  Service: General;  Laterality: N/A;  . PYELOLITHOTOMY    . skin cancer removal  02/2014  . TUBAL LIGATION      Family History  Problem Relation Age of Onset  . Breast cancer Maternal Grandmother   . Colon cancer Mother   . Cancer Mother    Social History:  reports that she has been smoking Cigarettes.  She has a 3.75 pack-year smoking history. She has never used smokeless tobacco. She reports  that she does not drink alcohol or use drugs.  Allergies: No Known Allergies   (Not in a hospital admission)  Results for orders placed or performed during the hospital encounter of 03/25/16 (from the past 48 hour(s))  Lipase, blood     Status: None   Collection Time: 03/25/16  3:27 PM  Result Value Ref Range   Lipase 42 11 - 51 U/L  Comprehensive metabolic panel     Status: Abnormal   Collection Time: 03/25/16  3:27 PM  Result Value Ref Range   Sodium 139 135 - 145 mmol/L   Potassium 3.9 3.5 - 5.1 mmol/L   Chloride 115 (H) 101 - 111 mmol/L   CO2 13 (L) 22 - 32 mmol/L   Glucose, Bld 120 (H) 65 - 99 mg/dL   BUN 37 (H) 6 - 20 mg/dL   Creatinine, Ser 2.15 (H) 0.44 - 1.00 mg/dL   Calcium 8.0 (L) 8.9 - 10.3 mg/dL   Total Protein 5.5 (L) 6.5 - 8.1 g/dL   Albumin 2.3 (L) 3.5 - 5.0 g/dL   AST 35 15 - 41 U/L   ALT 25 14 - 54 U/L   Alkaline Phosphatase 175 (H) 38 - 126 U/L   Total Bilirubin 0.3 0.3 - 1.2 mg/dL   GFR calc non Af Amer 22 (L) >60 mL/min   GFR  calc Af Amer 26 (L) >60 mL/min    Comment: (NOTE) The eGFR has been calculated using the CKD EPI equation. This calculation has not been validated in all clinical situations. eGFR's persistently <60 mL/min signify possible Chronic Kidney Disease.    Anion gap 11 5 - 15  CBC     Status: Abnormal   Collection Time: 03/25/16  3:27 PM  Result Value Ref Range   WBC 12.6 (H) 3.6 - 11.0 K/uL   RBC 3.65 (L) 3.80 - 5.20 MIL/uL   Hemoglobin 11.9 (L) 12.0 - 16.0 g/dL   HCT 35.1 35.0 - 47.0 %   MCV 96.3 80.0 - 100.0 fL   MCH 32.7 26.0 - 34.0 pg   MCHC 34.0 32.0 - 36.0 g/dL   RDW 14.2 11.5 - 14.5 %   Platelets 247 150 - 440 K/uL  Magnesium     Status: None   Collection Time: 03/25/16  3:27 PM  Result Value Ref Range   Magnesium 1.8 1.7 - 2.4 mg/dL  Phosphorus     Status: None   Collection Time: 03/25/16  3:27 PM  Result Value Ref Range   Phosphorus 2.6 2.5 - 4.6 mg/dL  Type and screen     Status: None   Collection Time: 03/25/16   3:27 PM  Result Value Ref Range   ABO/RH(D) O POS    Antibody Screen NEG    Sample Expiration 03/28/2016   C difficile quick scan w PCR reflex     Status: None   Collection Time: 03/25/16  4:53 PM  Result Value Ref Range   C Diff antigen NEGATIVE NEGATIVE   C Diff toxin NEGATIVE NEGATIVE   C Diff interpretation No C. difficile detected.   Occult blood card to lab, stool RN will collect     Status: Abnormal   Collection Time: 03/25/16  4:53 PM  Result Value Ref Range   Fecal Occult Bld POSITIVE (A) NEGATIVE   No results found.  Review of Systems  Constitutional: Positive for malaise/fatigue. Negative for chills and fever.  HENT: Negative for hearing loss.   Eyes: Negative for blurred vision.  Respiratory: Negative for shortness of breath.   Cardiovascular: Negative for chest pain.  Gastrointestinal: Positive for diarrhea, nausea and vomiting.  Genitourinary: Negative for dysuria.  Musculoskeletal: Positive for joint pain.  Skin: Negative for rash.  Neurological: Negative for dizziness and sensory change.    Blood pressure (!) 101/58, pulse (!) 111, temperature 98 F (36.7 C), temperature source Oral, resp. rate (!) 26, height _0  (1.575 m), weight 75 kg (165 lb 5 oz), SpO2 92 %. Physical Exam  Constitutional: She is oriented to person, place, and time. She appears well-developed and well-nourished.  Weak appearing  HENT:  Head: Normocephalic and atraumatic.  Mouth/Throat: No oropharyngeal exudate.  Oral mucosa dry  Eyes: EOM are normal. Pupils are equal, round, and reactive to light. No scleral icterus.  Neck: Neck supple. No JVD present. No tracheal deviation present. No thyromegaly present.  Cardiovascular:  Tachy with no mumurs  Respiratory: Effort normal and breath sounds normal. No respiratory distress. She has no wheezes. She exhibits no tenderness.  GI: Soft. There is no tenderness.  Hyperactive bowel sounds. Ostomy in place with liquid stool.  Musculoskeletal:  She exhibits no edema, tenderness or deformity.  Lymphadenopathy:    She has no cervical adenopathy.  Neurological: She is alert and oriented to person, place, and time. No cranial nerve deficit.  Skin: Skin is warm and  dry. No erythema.     Assessment/Plan 1. Acute Renal Failure: Secondary from volume loss from vomiting and high output ostomy. Hydrate aggressively with IVF and follow up renal function.  2. Diarrhea: On lomotil and octreotide. Will continue subq octreotide and scheduled lomotil. C. Diff negative.  3. Nausea/Vomiting: PRN antiemetics.  4. Tachycardia: Improving with IVF.  Time spent: 45 min  Baxter Hire, MD 03/25/2016, 6:45 PM

## 2016-03-25 NOTE — ED Notes (Signed)
Pt on transport to 217

## 2016-03-25 NOTE — ED Provider Notes (Signed)
[facility] Emergency Department Provider Note  ____________________________________________  Time seen: Approximately 3:41 PM  I have reviewed the triage vital signs and the nursing notes.   HISTORY  Chief Complaint Abdominal Pain and Nausea    HPI [name] is a 70 y.o. female who complains of generalized weakness fatigue and dry mouth today. She is recently Hospital where she is found to be dehydrated and have elected 5 abnormalities. This is thought to be due to copious watery output from her colostomy. She is receiving subcutaneous injections of medication to slow down bowel motility with persistent voluminous watery output. No change in quality of stool.  Eating/drinking normally. No vomiting. Denies cp/sob/ap.      Past Medical History:  Diagnosis Date  . Cancer [facility]) 2011   endometrial  . Chronic kidney disease    STONES  . Diverticulitis of colon (without mention of hemorrhage)   . History of kidney stones 2011  . Mechanical complication of colostomy and enterostomy   . Obesity, unspecified   . Personal history of tobacco use, presenting hazards to health      Patient Active Problem List   Diagnosis Date Noted  . Diarrhea   . Dehydration   . Acute renal failure (ARF) ([place]) 03/18/2016  . Hypomagnesemia 03/07/2016  . Watery stools 01/23/2016  . Generalized abdominal pain 01/23/2016  . Endometrial cancer ([place]) 02/10/2015  . Paroxysmal supraventricular tachycardia ([place]) 05/24/2014  . History of endometrial cancer 04/09/2014  . Tubular adenomas removed from the transverse colon in June 2010. Hyperplastic polyps identified in the sigmoid colon. 03/02/2014  . History of colon polyps 03/02/2014  . Ventral hernia 09/29/2013  . 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 ABDOMINAL HYSTERECTOMY  2011  .  APPENDECTOMY    . CHOLECYSTECTOMY  05/07/14   Incidental during ventral hernia repair.   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 COLON SURGERY  05/20/1999   Drainage of pelvic abscess, end colostomy  . COLON SURGERY  11/15/2010   Revision of colostomy stoma  . COLONOSCOPY  June 2010,03/31/14   Tubular adenoma of the transverse colon, hyperplastic polyps of the sigmoid.  Marland Kitchen COLOSTOMY  2011  . DILATION AND CURETTAGE OF UTERUS    . HERNIA REPAIR  04/06/14   ventral hernia, tetro rectus Ventralex ST mesh, 20 x 33 cm.   Marland Kitchen HYSTEROSCOPY  2011  . LITHOTRIPSY  2009  . MOLE REMOVAL  2003  . PORTACATH PLACEMENT N/A 03/01/2016   Procedure: INSERTION PORT-A-CATH;  Surgeon: [name], MD;  Location: [facility] ORS;  Service: General;  Laterality: N/A;  . PYELOLITHOTOMY    . skin cancer removal  02/2014  . TUBAL LIGATION       Current Outpatient Rx  . Order #: [id number] Class: Historical Med  . Order #: [id number] Class: Print  . Order #: [id number] Class: Print  . Order #: [id number] Class: Print  . Order #: [id number] Class: Print  . Order #: [id number] Class: Print     Allergies Review of patient's allergies indicates no known allergies.   Family History  Problem Relation Age of Onset  . Breast cancer Maternal Grandmother   . Colon cancer Mother   . Cancer Mother     Social History Social History  Substance Use Topics  . Smoking status: Current Every  Day Smoker    Packs/day: 0.25    Years: 15.00    Types: Cigarettes  . Smokeless tobacco: Never Used  . Alcohol use No    Review of Systems  Constitutional:   No fever or chills.  ENT:   No sore throat. No rhinorrhea. Cardiovascular:   No chest pain. Respiratory:   No dyspnea or cough. Gastrointestinal:   Negative for abdominal pain, vomiting. Persistent watery output through colostomy. Genitourinary:   Negative for dysuria or difficulty  urinating. Musculoskeletal:   Negative for focal pain or swelling Neurological:   Negative for headaches 10-point ROS otherwise negative.  ____________________________________________   PHYSICAL EXAM:  VITAL SIGNS: ED Triage Vitals  Enc Vitals Group     BP 03/25/16 1518 108/71     Pulse Rate 03/25/16 1518 (!) 112     Resp 03/25/16 1518 20     Temp 03/25/16 1518 98 F (36.7 C)     Temp Source 03/25/16 1518 Oral     SpO2 03/25/16 1511 (!) 85 %     Weight 03/25/16 1518 165 lb 5 oz (75 kg)     Height 03/25/16 1518 5\' 2"  (1.575 m)     Head Circumference --      Peak Flow --      Pain Score 03/25/16 1524 0     Pain Loc --      Pain Edu? --      Excl. in [place]? --     Vital signs reviewed, nursing assessments reviewed.   Constitutional:   Alert and oriented. No distress. Eyes:   No scleral icterus. No conjunctival pallor. PERRL. EOMI.  No nystagmus. ENT   Head:   Normocephalic and atraumatic.   Nose:   No congestion/rhinnorhea. No septal hematoma   Mouth/Throat:   Dry mucous membranes, no pharyngeal erythema. No peritonsillar mass.    Neck:   No stridor. No SubQ emphysema. No meningismus. Hematological/Lymphatic/Immunilogical:   No cervical lymphadenopathy. Cardiovascular:   Cardiac heart rate 110. Symmetric bilateral radial and DP pulses.  No murmurs.  Respiratory:   Normal respiratory effort without tachypnea nor retractions. Breath sounds are clear and equal bilaterally. No wheezes/rales/rhonchi. Gastrointestinal:   Soft and nontender. Non distended. There is no CVA tenderness.  No rebound, rigidity, or guarding. Colostomy in place, watery foamy output, not bloody. Colostomy output tested with Hemoccult, negative. QC controls okay. Genitourinary:   deferred Musculoskeletal:   Nontender with normal range of motion in all extremities. No joint effusions.  No lower extremity tenderness.  No edema. Neurologic:   Normal speech and language.  CN 2-10 normal. Motor  grossly intact. No gross focal neurologic deficits are appreciated.  Skin:    Skin is warm, dry and intact. No rash noted.  No petechiae, purpura, or bullae. Poor skin turgor  ____________________________________________    LABS (pertinent positives/negatives) (all labs ordered are listed, but only abnormal results are displayed) Labs Reviewed  COMPREHENSIVE METABOLIC PANEL - Abnormal; Notable for the following:       Result Value   Chloride 115 (*)    CO2 13 (*)    Glucose, Bld 120 (*)    BUN 37 (*)    Creatinine, Ser 2.15 (*)    Calcium 8.0 (*)    Total Protein 5.5 (*)    Albumin 2.3 (*)    Alkaline Phosphatase 175 (*)    GFR calc non Af Amer 22 (*)    GFR calc Af Amer 26 (*)    All  other components within normal limits  CBC - Abnormal; Notable for the following:    WBC 12.6 (*)    RBC 3.65 (*)    Hemoglobin 11.9 (*)    All other components within normal limits  C DIFFICILE QUICK SCREEN W PCR REFLEX  LIPASE, BLOOD  MAGNESIUM  PHOSPHORUS  URINALYSIS COMPLETEWITH MICROSCOPIC ([facility] ONLY)  OCCULT BLOOD X 1 CARD TO LAB, STOOL  TYPE AND SCREEN   ____________________________________________   EKG  Interpreted by me Sinus tachycardia rate 112, normal axis, mildly prolonged QTC at 512. Normal QRS ST segments and T waves.  ____________________________________________    RADIOLOGY    ____________________________________________   PROCEDURES Procedures  ____________________________________________   INITIAL IMPRESSION / ASSESSMENT AND PLAN / ED COURSE  Pertinent labs & imaging results that were available during my care of the patient were reviewed by me and considered in my medical decision making (see chart for details).  Patient not in distress but clinically appears dehydrated. Possibly anemia as well. We'll trend hemoglobin is a was 11.2 about a week ago. Check Hemoccult and C. difficile off of the colostomy output. IV fluids for now.     Clinical  Course  Value Comment By Time  Creatinine: (!) 2.15 Acute renal failure. No significant or joint abnormalities. Continue IV fluids. On reassessment patient has persistent tachycardia, we'll plan for admission. Carrie Mew, MD 07/23 1709   ____________________________________________   FINAL CLINICAL IMPRESSION(S) / ED DIAGNOSES  Final diagnoses:  Acute renal failure, unspecified acute renal failure type ([place])  Dehydration  Tachycardia       Portions of this note were generated with dragon dictation software. Dictation errors may occur despite best attempts at proofreading.    Carrie Mew, MD 03/25/16 [phone]

## 2016-03-25 NOTE — ED Triage Notes (Signed)
Pt presents to ED from home via EMS c/o n/v that worsened today. Recently discharged Friday. States lots of fluid is coming from her colostomy bag.

## 2016-03-26 ENCOUNTER — Inpatient Hospital Stay: Payer: Medicare Other

## 2016-03-26 LAB — GASTROINTESTINAL PANEL BY PCR, STOOL (REPLACES STOOL CULTURE)
ADENOVIRUS F40/41: NOT DETECTED
Astrovirus: NOT DETECTED
CAMPYLOBACTER SPECIES: NOT DETECTED
CRYPTOSPORIDIUM: NOT DETECTED
Cyclospora cayetanensis: NOT DETECTED
E. coli O157: NOT DETECTED
ENTEROAGGREGATIVE E COLI (EAEC): NOT DETECTED
ENTEROPATHOGENIC E COLI (EPEC): NOT DETECTED
Entamoeba histolytica: NOT DETECTED
Enterotoxigenic E coli (ETEC): NOT DETECTED
Giardia lamblia: NOT DETECTED
NOROVIRUS GI/GII: NOT DETECTED
PLESIMONAS SHIGELLOIDES: NOT DETECTED
ROTAVIRUS A: NOT DETECTED
SALMONELLA SPECIES: NOT DETECTED
SHIGA LIKE TOXIN PRODUCING E COLI (STEC): NOT DETECTED
SHIGELLA/ENTEROINVASIVE E COLI (EIEC): NOT DETECTED
Sapovirus (I, II, IV, and V): NOT DETECTED
Vibrio cholerae: NOT DETECTED
Vibrio species: NOT DETECTED
YERSINIA ENTEROCOLITICA: NOT DETECTED

## 2016-03-26 LAB — BASIC METABOLIC PANEL
Anion gap: 5 (ref 5–15)
BUN: 35 mg/dL — AB (ref 6–20)
CO2: 13 mmol/L — ABNORMAL LOW (ref 22–32)
CREATININE: 1.92 mg/dL — AB (ref 0.44–1.00)
Calcium: 7.4 mg/dL — ABNORMAL LOW (ref 8.9–10.3)
Chloride: 120 mmol/L — ABNORMAL HIGH (ref 101–111)
GFR, EST AFRICAN AMERICAN: 30 mL/min — AB (ref >60)
GFR, EST NON AFRICAN AMERICAN: 26 mL/min — AB (ref >60)
Glucose, Bld: 82 mg/dL (ref 65–99)
POTASSIUM: 4.1 mmol/L (ref 3.5–5.1)
SODIUM: 138 mmol/L (ref 135–145)

## 2016-03-26 MED ORDER — TECHNETIUM TO 99M ALBUMIN AGGREGATED
4.0000 | Freq: Once | INTRAVENOUS | Status: AC | PRN
  Administered 2016-03-26: 4.368 via INTRAVENOUS

## 2016-03-26 MED ORDER — DEXTROSE 5 % IV SOLN
1.0000 g | INTRAVENOUS | Status: DC
  Administered 2016-03-26 – 2016-03-27 (×2): 1 g via INTRAVENOUS
  Filled 2016-03-26 (×4): qty 10

## 2016-03-26 MED ORDER — TECHNETIUM TC 99M DIETHYLENETRIAME-PENTAACETIC ACID: 32.5300 | Freq: Once | INTRAVENOUS | Status: DC | PRN

## 2016-03-26 MED ORDER — TECHNETIUM TC 99M DIETHYLENETRIAME-PENTAACETIC ACID
30.0000 | Freq: Once | INTRAVENOUS | Status: AC | PRN
Start: 2016-03-26 — End: 2016-03-26
  Administered 2016-03-26: 32.53 via INTRAVENOUS

## 2016-03-26 NOTE — Progress Notes (Signed)
SpO2 88% on O2 via 6LPM Nasal Cannula. Pt. Resting comfortably with no shortness of breath or distress noted. Pt. Placed on High Flow Nasal Cannula 45L @ 88% with SpO2 improving to 90%. Pt. Tolerating well. Will continue to monitor.

## 2016-03-26 NOTE — Progress Notes (Signed)
Report given to [facility] on 1C. Estill Bamberg is aware that pt is in nuclear medicine and we will receive pt from nuclear medicine, once completed. RN took pt belongings to room 110.   Angus Seller

## 2016-03-26 NOTE — Progress Notes (Signed)
Doctor Ether Griffins was notified of pt. Being pale, lips are blue, hands are cool, and weak radial pulses. Respiratory was called for reinforcement of pt.'s HFNC. Pt is reaching 90% on 45 L/min. Pt states that "she does not feel short of breath and feels fine" Pt also states that she does not use oxygen at home. Will continue to monitor pt closely.   [name]

## 2016-03-26 NOTE — Progress Notes (Signed)
[place] at [place] NAME: [name]    MR#:  [id number]  DATE OF BIRTH:  [birth year]  SUBJECTIVE:  CHIEF COMPLAINT:   Chief Complaint  Patient presents with  . Abdominal Pain  . Nausea  Patient is a 70 year old Caucasian female with past medical history significant for history of endometrial cancer, who presents to the hospital with complaints of nausea, abdominal pain, weakness. On arrival to emergency room, she was noted to be in acute renal failure, tachycardic, admitted for IV fluid infusion. Patient feels good today. Denies any abdominal pain or discomfort, able to eat breakfast, no significant nausea. Denies any shortness of breath, although remains on high flow oxygen through nasal cannula. Chest x-ray was not done. In emergency room, the emergently on the floor, unremarkable. V/Q spending. Kidney function is a little bit better on IV fluid administration, although urinary output is marginal at 325 cc. Stool in the colostomy bag is liquidy, watery, about 215 cc since admission.  Review of Systems  Constitutional: Negative for chills, fever and weight loss.  HENT: Negative for congestion.   Eyes: Negative for blurred vision and double vision.  Respiratory: Negative for cough, sputum production, shortness of breath and wheezing.   Cardiovascular: Negative for chest pain, palpitations, orthopnea, leg swelling and PND.  Gastrointestinal: Negative for abdominal pain, blood in stool, constipation, diarrhea, nausea and vomiting.  Genitourinary: Negative for dysuria, frequency, hematuria and urgency.  Musculoskeletal: Negative for falls.  Neurological: Negative for dizziness, tremors, focal weakness and headaches.  Endo/Heme/Allergies: Does not bruise/bleed easily.  Psychiatric/Behavioral: Negative for depression. The patient does not have insomnia.     VITAL SIGNS: Blood pressure 121/67, pulse 77, temperature 97.5 F (36.4 C),  temperature source Oral, resp. rate 20, height 5\' 2"  (1.575 m), weight 72.6 kg (160 lb 1.6 oz), SpO2 (!) 87 %.  PHYSICAL EXAMINATION:   GENERAL:  70 y.o.-year-old patient lying in the bed in mild to moderate respiratory distress, remains on 45% of FiO2 via high flow nasal cannulas.  EYES: Pupils equal, round, reactive to light and accommodation. No scleral icterus. Extraocular muscles intact.  HEENT: Head atraumatic, normocephalic. Oropharynx and nasopharynx clear.  NECK:  Supple, no jugular venous distention. No thyroid enlargement, no tenderness.  LUNGS: Normal breath sounds bilaterally, no wheezing, rales,rhonchi or crepitation. Intermittent use of accessory muscles of respiration, mostly with exertion or speech.  CARDIOVASCULAR: S1, S2 , tachycardic. No murmurs, rubs, or gallops.  ABDOMEN: Soft, nontender, nondistended. Bowel sounds present. No organomegaly or mass. Ostomy bag with liquidy stool.  EXTREMITIES: Trace left lower extremity and pedal edema, some calf tenderness on the left, no cyanosis, or clubbing.  NEUROLOGIC: Cranial nerves II through XII are intact. Muscle strength 5/5 in all extremities. Sensation intact. Gait not checked.  PSYCHIATRIC: The patient is alert and oriented x 2, but not to situation.  SKIN: No obvious rash, lesion, or ulcer.   ORDERS/RESULTS REVIEWED:   CBC  Recent Labs Lab 03/25/16 1527  WBC 12.6*  HGB 11.9*  HCT 35.1  PLT 247  MCV 96.3  MCH 32.7  MCHC 34.0  RDW 14.2   ------------------------------------------------------------------------------------------------------------------  Chemistries   Recent Labs Lab 03/20/16 0543 03/21/16 0500 03/22/16 1023 03/22/16 1611 03/23/16 0509 03/25/16 1527 03/26/16 0520  NA 139 137 138  --  140 139 138  K 3.7 3.8 2.9* 3.1* 3.8 3.9 4.1  CL 116* 114* 115*  --  117* 115* 120*  CO2 18* 16* 15*  --  16* 13* 13*  GLUCOSE 87 97 111*  --  105* 120* 82  BUN 33* 26* 21*  --  20 37* 35*  CREATININE  1.13* 1.32* 1.27*  --  1.29* 2.15* 1.92*  CALCIUM 7.3* 7.5* 8.1*  --  8.3* 8.0* 7.4*  MG 1.4* 1.6*  --   --   --  1.8  --   AST  --   --   --   --   --  35  --   ALT  --   --   --   --   --  25  --   ALKPHOS  --   --   --   --   --  175*  --   BILITOT  --   --   --   --   --  0.3  --    ------------------------------------------------------------------------------------------------------------------ estimated creatinine clearance is 25.8 mL/min (by C-G formula based on SCr of 1.92 mg/dL). ------------------------------------------------------------------------------------------------------------------ No results for input(s): TSH, T4TOTAL, T3FREE, THYROIDAB in the last 72 hours.  Invalid input(s): FREET3  Cardiac Enzymes No results for input(s): CKMB, TROPONINI, MYOGLOBIN in the last 168 hours.  Invalid input(s): CK ------------------------------------------------------------------------------------------------------------------ Invalid input(s): POCBNP ---------------------------------------------------------------------------------------------------------------  RADIOLOGY: US Renal  Result Date: 03/26/2016 CLINICAL DATA:  Acute renal failure. EXAM: RENAL / URINARY TRACT ULTRASOUND COMPLETE COMPARISON:  PET-CT 02/16/2016.  CT abdomen 02/06/2016. FINDINGS: Right Kidney: Length: 11.5 cm. Echogenicity within normal limits. Multiple renal cysts are noted some of which are thinly septated. No definite solid lesion identified. Polycystic kidney disease could present in this fashion. No hydronephrosis. Left Kidney: Length: 9.8 cm. Echogenicity within normal limits. Multiple renal cysts are noted some of which are thinly septated. Polycystic kidney disease could present this fashion. No hydronephrosis . Bladder: Appears normal for degree of bladder distention. IMPRESSION: 1. Multiple bilateral renal cysts some of which are thinly septated. No definite solid lesion identified. Polycystic kidney  disease could present this fashion. No hydronephrosis or acute abnormality. 2. No evidence of bladder distention. Electronically Signed   By: Marcello Moores  Register   On: 03/26/2016 10:38  Dg Chest Port 1 View  Result Date: 03/26/2016 CLINICAL DATA:  Hypoxemia, tachycardia EXAM: PORTABLE CHEST 1 VIEW COMPARISON:  03/18/2016 FINDINGS: Right-sided Port-A-Cath in satisfactory position. There is no focal consolidation. There is mild right lower lobe atelectasis. There is no pleural effusion or pneumothorax. The heart and mediastinal contours are unremarkable. 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 IMPRESSION: No active disease. Electronically Signed   By: Kathreen Devoid   On: 03/26/2016 09:11   EKG:  Orders placed or performed during the hospital encounter of 03/18/16  . ED EKG  . ED EKG    ASSESSMENT AND PLAN:  Active Problems:   Acute renal failure (ARF) (HCC) #1. Acute on chronic renal failure, likely dehydration related, continue IV fluids, following kidney function closely, seems to be improving, ultrasound of kidneys showed no hydronephrosis. Urinalysis revealed pyuria, concerning for urinary tract infection, get stat urine culture, initiate patient on Rocephin #2. Pyuria, concerning for urinary tract infection, get urinary cultures, initiate Rocephin, follow culture results and adjust antibiotics depending on culture results #3. Diarrhea,, get stool cultures, C. difficile was negative, continue IV fluids, add cholestyramine if stool cultures are negative for bacterial infection #4. Acute respiratory failure with hypoxia of unclear etiology, get VQ scan and chest x-ray was unremarkable to rule out pulmonary embolism, continue oxygen therapy, keeping pulse oximeter at around 92-94%. Get pulmonary involved  Management plans discussed with the patient, family and they are in agreement.   DRUG ALLERGIES: No Known Allergies  CODE STATUS:     Code Status Orders        Start      Ordered   03/25/16 1942  Full code  Continuous     03/25/16 1941    Code Status History    Date Active Date Inactive Code Status Order ID Comments User Context   03/18/2016 11:46 AM 03/23/2016  7:42 PM Full Code [id number]  Lytle Butte, MD ED      TOTAL Critical care TIME TAKING CARE OF THIS PATIENT: 45 minutes.    Theodoro Grist M.D on 03/26/2016 at 11:06 AM  Between 7am to 6pm - Pager - [phone]  After 6pm go to [email or web address] - password [email or web address] [facility]  [place] [facility]  Office  [phone]  CC: Primary care physician; [name], MD

## 2016-03-27 ENCOUNTER — Inpatient Hospital Stay: Payer: Medicare Other

## 2016-03-27 LAB — BASIC METABOLIC PANEL
Anion gap: 7 (ref 5–15)
BUN: 24 mg/dL — AB (ref 6–20)
CALCIUM: 7.7 mg/dL — AB (ref 8.9–10.3)
CHLORIDE: 118 mmol/L — AB (ref 101–111)
CO2: 15 mmol/L — AB (ref 22–32)
CREATININE: 1.5 mg/dL — AB (ref 0.44–1.00)
GFR calc Af Amer: 40 mL/min — ABNORMAL LOW (ref >60)
GFR calc non Af Amer: 34 mL/min — ABNORMAL LOW (ref >60)
Glucose, Bld: 108 mg/dL — ABNORMAL HIGH (ref 65–99)
Potassium: 4.4 mmol/L (ref 3.5–5.1)
SODIUM: 140 mmol/L (ref 135–145)

## 2016-03-27 LAB — URINE CULTURE

## 2016-03-27 LAB — CBC
HCT: 34.1 % — ABNORMAL LOW (ref 35.0–47.0)
Hemoglobin: 11.4 g/dL — ABNORMAL LOW (ref 12.0–16.0)
MCH: 33 pg (ref 26.0–34.0)
MCHC: 33.5 g/dL (ref 32.0–36.0)
MCV: 98.4 fL (ref 80.0–100.0)
PLATELETS: 242 10*3/uL (ref 150–440)
RBC: 3.46 MIL/uL — ABNORMAL LOW (ref 3.80–5.20)
RDW: 15.1 % — AB (ref 11.5–14.5)
WBC: 10 10*3/uL (ref 3.6–11.0)

## 2016-03-27 MED ORDER — BUDESONIDE 0.25 MG/2ML IN SUSP
0.2500 mg | Freq: Two times a day (BID) | RESPIRATORY_TRACT | Status: DC
  Filled 2016-03-27: qty 2

## 2016-03-27 MED ORDER — METHYLPREDNISOLONE SODIUM SUCC 125 MG IJ SOLR
60.0000 mg | INTRAMUSCULAR | Status: DC
  Administered 2016-03-27 – 2016-03-28 (×2): 60 mg via INTRAVENOUS
  Filled 2016-03-27 (×2): qty 2

## 2016-03-27 MED ORDER — IPRATROPIUM-ALBUTEROL 0.5-2.5 (3) MG/3ML IN SOLN
3.0000 mL | RESPIRATORY_TRACT | Status: DC
  Filled 2016-03-27: qty 3

## 2016-03-27 MED ORDER — BOOST / RESOURCE BREEZE PO LIQD
1.0000 | Freq: Two times a day (BID) | ORAL | Status: DC
  Administered 2016-03-27: 1 via ORAL

## 2016-03-27 NOTE — Care Management Important Message (Signed)
Important Message  Patient Details  Name: [name] MRN: [id number] Date of Birth: 1946-04-12   Medicare Important Message Given:  Yes    [name], RN 03/27/2016, 1:25 PM

## 2016-03-27 NOTE — Progress Notes (Signed)
Pt transitioned over to room air by respiratory therapist, o2 sats maintained in mid to upper 90s at this time, pt with no complaints, no distress or discomfort noted

## 2016-03-27 NOTE — Progress Notes (Signed)
Initial Nutrition Assessment  DOCUMENTATION CODES:   Not applicable  INTERVENTION:   Cater to pt preferences on Soft diet order. Pt reports only liking popsicles at the moment.  Recommend Boost Breeze po BID, each supplement provides 250 kcal and 9 grams of protein, pt now agreeable to try.   NUTRITION DIAGNOSIS:   Inadequate oral intake related to poor appetite as evidenced by per patient/family report.  GOAL:   Patient will meet greater than or equal to 90% of their needs  MONITOR:   PO intake, Supplement acceptance, Labs, Weight trends, I & O's  REASON FOR ASSESSMENT:   Malnutrition Screening Tool    ASSESSMENT:   Pt recently discharged from hospital 2 days PTA w n/v and increased ostomy output with abnormal electrolytes. Pt with h/o endometrial cancer undergoing chemotherapy. Pt currently admitted with the same and respiratory failure, on HFNC this am.  Past Medical History:  Diagnosis Date  . Cancer [facility]) 2011   endometrial  . Chronic kidney disease    STONES  . Diverticulitis of colon (without mention of hemorrhage)   . History of kidney stones 2011  . Mechanical complication of colostomy and enterostomy   . Obesity, unspecified   . Personal history of tobacco use, presenting hazards to health     Diet Order:  DIET SOFT Room service appropriate? Yes; Fluid consistency: Thin   Pt reports eating one bite of biscuit brought in by husband this morning. Pt reports eating all of a red popsicle and reports that is the only thing she could tolerate right now.    Medications: Solumedrol Labs: BUN 24, Cre 1.50 (Magnesium, Potassium and Phosphorus WDL on admission)   Gastrointestinal Profile: Last BM:  7/25/25017 via ostomy   Nutrition-Focused Physical Exam Findings:  Unable to complete Nutrition-Focused physical exam at this time.    Weight Change: Per [facility] weight trends, weight increased since last admission, however still down 4lbs from last month (2%  weight loss in one month)  Skin:  Reviewed, no issues   Height:   Ht Readings from Last 1 Encounters:  03/25/16 5\' 2"  (1.575 m)    Weight:   Wt Readings from Last 1 Encounters:  03/25/16 160 lb 1.6 oz (72.6 kg)   Wt Readings from Last 10 Encounters:  03/25/16 160 lb 1.6 oz (72.6 kg)  03/18/16 150 lb (68 kg)  03/07/16 155 lb 10.3 oz (70.6 kg)  03/01/16 157 lb (71.2 kg)  02/27/16 164 lb 9.2 oz (74.6 kg)  02/08/16 165 lb (74.8 kg)  02/08/16 165 lb 5 oz (75 kg)  01/23/16 163 lb (73.9 kg)  12/13/15 172 lb (78 kg)  11/25/15 175 lb (79.4 kg)    BMI:  Body mass index is 29.28 kg/m.  Estimated Nutritional Needs:   Kcal:  1825-2150kcals  Protein:  87-105g protein  Fluid:  >/= 1.8L fluid  EDUCATION NEEDS:   No education needs identified at this time  Dwyane Luo, RD, LDN Pager [phone] Weekend/On-Call Pager [phone]

## 2016-03-27 NOTE — Progress Notes (Signed)
[place] at [place] NAME: [name]    MR#:  [id number]  DATE OF BIRTH:  August 16, 1946  SUBJECTIVE:  CHIEF COMPLAINT:   Chief Complaint  Patient presents with  . Abdominal Pain  . Nausea  Patient is a 70 year old Caucasian female with past medical history significant for history of endometrial cancer, who presents to the hospital with complaints of nausea, abdominal pain, weakness. On arrival to emergency room, she was noted to be in acute renal failure, tachycardic, admitted for IV fluid infusion. Patient feels good today. Denies any abdominal pain or discomfort, able to eat breakfast, no nausea. Denies shortness of breath, although remains on high flow oxygen through nasal cannula. Chest x-ray Was unremarkable, as well as VQ scan. The CT scan of the chest is pending. Patient admits of having smoked cigarettes for approximately 30 years   Review of Systems  Constitutional: Negative for chills, fever and weight loss.  HENT: Negative for congestion.   Eyes: Negative for blurred vision and double vision.  Respiratory: Negative for cough, sputum production, shortness of breath and wheezing.   Cardiovascular: Negative for chest pain, palpitations, orthopnea, leg swelling and PND.  Gastrointestinal: Negative for abdominal pain, blood in stool, constipation, diarrhea, nausea and vomiting.  Genitourinary: Negative for dysuria, frequency, hematuria and urgency.  Musculoskeletal: Negative for falls.  Neurological: Negative for dizziness, tremors, focal weakness and headaches.  Endo/Heme/Allergies: Does not bruise/bleed easily.  Psychiatric/Behavioral: Negative for depression. The patient does not have insomnia.     VITAL SIGNS: Blood pressure 131/72, pulse 75, temperature 97.6 F (36.4 C), temperature source Oral, resp. rate 18, height 5\' 2"  (1.575 m), weight 72.6 kg (160 lb 1.6 oz), SpO2 100 %.  PHYSICAL EXAMINATION:   GENERAL:  70  y.o.-year-old patient lying in the bed in mild respiratory distress, remains on 73% of FiO2 via high flow nasal cannulas.  EYES: Pupils equal, round, reactive to light and accommodation. No scleral icterus. Extraocular muscles intact.  HEENT: Head atraumatic, normocephalic. Oropharynx and nasopharynx clear.  NECK:  Supple, no jugular venous distention. No thyroid enlargement, no tenderness.  LUNGS: Normal breath sounds bilaterally, no wheezing, rales,rhonchi or crepitation. Intermittent use of accessory muscles of respiration, mostly with exertion or speech.  CARDIOVASCULAR: S1, S2 , tachycardic. No murmurs, rubs, or gallops.  ABDOMEN: Soft, nontender, nondistended. Bowel sounds present. No organomegaly or mass. Ostomy bag had no stool.  EXTREMITIES: Trace left lower extremity and pedal edema, some calf tenderness on the left, no cyanosis, or clubbing.  NEUROLOGIC: Cranial nerves II through XII are intact. Muscle strength 5/5 in all extremities. Sensation intact. Gait not checked.  PSYCHIATRIC: The patient is alert and oriented x 2, but not to situation.  SKIN: No obvious rash, lesion, or ulcer.   ORDERS/RESULTS REVIEWED:   CBC  Recent Labs Lab 03/25/16 1527 03/27/16 0421  WBC 12.6* 10.0  HGB 11.9* 11.4*  HCT 35.1 34.1*  PLT 247 242  MCV 96.3 98.4  MCH 32.7 33.0  MCHC 34.0 33.5  RDW 14.2 15.1*   ------------------------------------------------------------------------------------------------------------------  Chemistries   Recent Labs Lab 03/21/16 0500 03/22/16 1023 03/22/16 1611 03/23/16 0509 03/25/16 1527 03/26/16 0520 03/27/16 0421  NA 137 138  --  140 139 138 140  K 3.8 2.9* 3.1* 3.8 3.9 4.1 4.4  CL 114* 115*  --  117* 115* 120* 118*  CO2 16* 15*  --  16* 13* 13* 15*  GLUCOSE 97 111*  --  105* 120* 82  108*  BUN 26* 21*  --  20 37* 35* 24*  CREATININE 1.32* 1.27*  --  1.29* 2.15* 1.92* 1.50*  CALCIUM 7.5* 8.1*  --  8.3* 8.0* 7.4* 7.7*  MG 1.6*  --   --   --  1.8   --   --   AST  --   --   --   --  35  --   --   ALT  --   --   --   --  25  --   --   ALKPHOS  --   --   --   --  175*  --   --   BILITOT  --   --   --   --  0.3  --   --    ------------------------------------------------------------------------------------------------------------------ estimated creatinine clearance is 33 mL/min (by C-G formula based on SCr of 1.5 mg/dL). ------------------------------------------------------------------------------------------------------------------ No results for input(s): TSH, T4TOTAL, T3FREE, THYROIDAB in the last 72 hours.  Invalid input(s): FREET3  Cardiac Enzymes No results for input(s): CKMB, TROPONINI, MYOGLOBIN in the last 168 hours.  Invalid input(s): CK ------------------------------------------------------------------------------------------------------------------ Invalid input(s): POCBNP ---------------------------------------------------------------------------------------------------------------  RADIOLOGY: US Renal  Result Date: 03/26/2016 CLINICAL DATA:  Acute renal failure. EXAM: RENAL / URINARY TRACT ULTRASOUND COMPLETE COMPARISON:  PET-CT 02/16/2016.  CT abdomen 02/06/2016. FINDINGS: Right Kidney: Length: 11.5 cm. Echogenicity within normal limits. Multiple renal cysts are noted some of which are thinly septated. No definite solid lesion identified. Polycystic kidney disease could present in this fashion. No hydronephrosis. Left Kidney: Length: 9.8 cm. Echogenicity within normal limits. Multiple renal cysts are noted some of which are thinly septated. Polycystic kidney disease could present this fashion. No hydronephrosis . Bladder: Appears normal for degree of bladder distention. IMPRESSION: 1. Multiple bilateral renal cysts some of which are thinly septated. No definite solid lesion identified. Polycystic kidney disease could present this fashion. No hydronephrosis or acute abnormality. 2. No evidence of bladder distention.  Electronically Signed   By: Marcello Moores  Register   On: 03/26/2016 10:38  Nm Pulmonary Perf And Vent  Result Date: 03/26/2016 CLINICAL DATA:  Hypoxia and weakness EXAM: NUCLEAR MEDICINE VENTILATION - PERFUSION LUNG SCAN TECHNIQUE: Ventilation images were obtained in multiple projections using inhaled aerosol Tc-46m DTPA. Perfusion images were obtained in multiple projections after intravenous injection of Tc-44m MAA. RADIOPHARMACEUTICALS:  32.53 mCi Technetium-78m DTPA aerosol inhalation and 4.368 mCi Technetium-32m MAA IV COMPARISON:  Chest x-ray from earlier in the same day FINDINGS: Ventilation: Ventilation images demonstrate some central clumping although no definitive ventilation defect is seen. Perfusion: No wedge shaped peripheral perfusion defects to suggest acute pulmonary embolism. IMPRESSION: No ventilation-perfusion mismatch to suggest pulmonary embolism is noted. Electronically Signed   By: Inez Catalina M.D.   On: 03/26/2016 16:37  US Venous Img Lower Unilateral Left  Result Date: 03/26/2016 CLINICAL DATA:  70 year old female with a history of calf pain EXAM: LEFT LOWER EXTREMITY VENOUS DOPPLER ULTRASOUND TECHNIQUE: Gray-scale sonography with graded compression, as well as color Doppler and duplex ultrasound were performed to evaluate the lower extremity deep venous systems from the level of the common femoral vein and including the common femoral, femoral, profunda femoral, popliteal and calf veins including the posterior tibial, peroneal and gastrocnemius veins when visible. The superficial great saphenous vein was also interrogated. Spectral Doppler was utilized to evaluate flow at rest and with distal augmentation maneuvers in the common femoral, femoral and popliteal veins. COMPARISON:  None. FINDINGS: Contralateral Common Femoral Vein: Respiratory phasicity is normal  and symmetric with the symptomatic side. No evidence of thrombus. Normal compressibility. Common Femoral Vein: No evidence of  thrombus. Normal compressibility, respiratory phasicity and response to augmentation. Saphenofemoral Junction: No evidence of thrombus. Normal compressibility and flow on color Doppler imaging. Profunda Femoral Vein: No evidence of thrombus. Normal compressibility and flow on color Doppler imaging. Femoral Vein: No evidence of thrombus. Normal compressibility, respiratory phasicity and response to augmentation. Popliteal Vein: No evidence of thrombus. Normal compressibility, respiratory phasicity and response to augmentation. Calf Veins: No evidence of thrombus. Normal compressibility and flow on color Doppler imaging. Superficial Great Saphenous Vein: No evidence of thrombus. Normal compressibility and flow on color Doppler imaging. Other Findings:  None. IMPRESSION: Sonographic survey of the left lower extremity negative for DVT. Signed, Dulcy Fanny. Earleen Newport, DO [facility] Electronically Signed   By: Corrie Mckusick D.O.   On: 03/26/2016 14:11  Dg Chest Port 1 View  Result Date: 03/26/2016 CLINICAL DATA:  Hypoxemia, tachycardia EXAM: PORTABLE CHEST 1 VIEW COMPARISON:  03/18/2016 FINDINGS: Right-sided Port-A-Cath in satisfactory position. There is no focal consolidation. There is mild right lower lobe atelectasis. There is no pleural effusion or pneumothorax. The heart and mediastinal contours are unremarkable. 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 IMPRESSION: No active disease. Electronically Signed   By: Kathreen Devoid   On: 03/26/2016 09:11   EKG:  Orders placed or performed during the hospital encounter of 03/18/16  . ED EKG  . ED EKG    ASSESSMENT AND PLAN:  Active Problems:   Acute renal failure (ARF) (HCC) #1. Acute on chronic renal failure, likely dehydration related, Improved, continue IV fluids, following kidney function in the morning, ultrasound of kidneys showed no hydronephrosis. Urinalysis revealed pyuria, concerning  for urinary tract infection,  urine culture is pending, continue patient on Rocephin #2. Urinary tract infection versus sterile pyuria, urinary culture is pending, continue Rocephin, follow culture results and adjust antibiotics depending on culture results #3. Diarrhea, likely viral, stool cultures, including C. difficile were negative, continue IV fluids, lower rate, diarrhea has subsided  #4. Acute respiratory failure with hypoxia of unclear etiology, VQ scan is low probability for PE, chest x-ray was unremarkable , initiate patient on steroids, inhalation therapy, as long as she is agreeable, getting pulmonologist involved, initiate incentive spirometry, taper off oxygen therapy, wean off to room air, keeping pulse oximeter at around 92-94%.    Management plans discussed with the patient, family and they are in agreement.   DRUG ALLERGIES: No Known Allergies  CODE STATUS:     Code Status Orders        Start     Ordered   03/25/16 1942  Full code  Continuous     03/25/16 1941    Code Status History    Date Active Date Inactive Code Status Order ID Comments User Context   03/18/2016 11:46 AM 03/23/2016  7:42 PM Full Code [id number]  Lytle Butte, MD ED      TOTAL TIME TAKING CARE OF THIS PATIENT: 35 minutes.    Theodoro Grist M.D on 03/27/2016 at 11:00 AM  Between 7am to 6pm - Pager - [phone]  After 6pm go to [email or web address] - password [email or web address] [facility]  [place] [facility]  Office  [phone]  CC: Primary care physician; [name], MD

## 2016-03-28 ENCOUNTER — Encounter: Payer: Medicare Other | Admitting: Hematology and Oncology

## 2016-03-28 ENCOUNTER — Ambulatory Visit: Payer: Medicare Other

## 2016-03-28 ENCOUNTER — Other Ambulatory Visit: Payer: Self-pay | Admitting: Internal Medicine

## 2016-03-28 ENCOUNTER — Other Ambulatory Visit: Payer: Self-pay | Admitting: Hematology and Oncology

## 2016-03-28 ENCOUNTER — Other Ambulatory Visit: Payer: Medicare Other

## 2016-03-28 DIAGNOSIS — J9601 Acute respiratory failure with hypoxia: Secondary | ICD-10-CM

## 2016-03-28 DIAGNOSIS — R8271 Bacteriuria: Secondary | ICD-10-CM

## 2016-03-28 DIAGNOSIS — R8281 Pyuria: Secondary | ICD-10-CM

## 2016-03-28 DIAGNOSIS — J9811 Atelectasis: Secondary | ICD-10-CM

## 2016-03-28 DIAGNOSIS — E872 Acidosis, unspecified: Secondary | ICD-10-CM

## 2016-03-28 LAB — BASIC METABOLIC PANEL
Anion gap: 8 (ref 5–15)
BUN: 19 mg/dL (ref 6–20)
CHLORIDE: 118 mmol/L — AB (ref 101–111)
CO2: 14 mmol/L — AB (ref 22–32)
Calcium: 8.1 mg/dL — ABNORMAL LOW (ref 8.9–10.3)
Creatinine, Ser: 1.18 mg/dL — ABNORMAL HIGH (ref 0.44–1.00)
GFR calc non Af Amer: 46 mL/min — ABNORMAL LOW (ref >60)
GFR, EST AFRICAN AMERICAN: 53 mL/min — AB (ref >60)
Glucose, Bld: 125 mg/dL — ABNORMAL HIGH (ref 65–99)
POTASSIUM: 4.3 mmol/L (ref 3.5–5.1)
SODIUM: 140 mmol/L (ref 135–145)

## 2016-03-28 MED ORDER — SODIUM BICARBONATE 650 MG PO TABS
650.0000 mg | ORAL_TABLET | Freq: Two times a day (BID) | ORAL | Status: DC
  Administered 2016-03-28: 08:00:00 650 mg via ORAL
  Filled 2016-03-28: qty 1

## 2016-03-28 MED ORDER — SODIUM BICARBONATE 650 MG PO TABS: 650.0000 mg | ORAL_TABLET | Freq: Two times a day (BID) | ORAL | 6 refills | Status: DC

## 2016-03-28 MED ORDER — BOOST / RESOURCE BREEZE PO LIQD: 1.0000 | Freq: Two times a day (BID) | ORAL | 5 refills | Status: DC

## 2016-03-28 MED ORDER — OCTREOTIDE ACETATE 20 MG IM KIT: 10.0000 mg | PACK | Freq: Once | INTRAMUSCULAR | Status: DC

## 2016-03-28 NOTE — Discharge Summary (Signed)
[facility] NAME: Shanquetta Ormond    MR#:  [id number]  DATE OF BIRTH:  05-14-1946  DATE OF ADMISSION:  03/25/2016 ADMITTING PHYSICIAN: Baxter Hire, MD  DATE OF DISCHARGE: No discharge date for patient encounter.  PRIMARY CARE PHYSICIAN: [name], MD     ADMISSION DIAGNOSIS:  Dehydration [E86.0] Tachycardia [R00.0] Acute renal failure, unspecified acute renal failure type ([facility]) [N17.9]  DISCHARGE DIAGNOSIS:  Principal Problem:   Acute renal failure (ARF) (HCC) Active Problems:   Diarrhea   Acute respiratory failure with hypoxia (HCC)   Atelectasis   Metabolic acidosis   Bacteriuria with pyuria   SECONDARY DIAGNOSIS:   Past Medical History:  Diagnosis Date  . Cancer [facility]) 2011   endometrial  . Chronic kidney disease    STONES  . Diverticulitis of colon (without mention of hemorrhage)   . History of kidney stones 2011  . Mechanical complication of colostomy and enterostomy   . Obesity, unspecified   . Personal history of tobacco use, presenting hazards to health     .pro HOSPITAL COURSE:  Patient is a 70 year old Caucasian female with past medical history significant for history of endometrial cancer, who presents to the hospital with complaints of nausea, abdominal pain, weakness. On arrival to emergency room, she was noted to be in acute renal failure, tachycardic, dehydrated and was admitted for IV fluid infusion. With this conservative therapy the patient's condition improved, her kidney function improved to GFR of  53, patient's baseline.  Patient's diarrhea subsided, oral intake has improved. Stool cultures taken in the hospital revealed no growth, it was negative for C. difficile. Patient's labs, however, revealed metabolic acidosis, concerning for bicarbonate loss with diarrheal stool, sodium bicarbonate was initiated orally. Patient was advised to continue octreotide subcutaneously, as previously  prescribed by Dr. Mike Gip, discontinue bicarbonate when the diarrhea subsides. While in the hospital patient was noted to be severely hypoxic, requiring high flow oxygen per nasal cannulas, VQ scan was negative as well as chest x-ray, CT scan of the chest revealed atelectasis in the right middle lobe, concerning for possible aspiration. Patient was given incentive spirometer, nebulizing therapy and improved, weaned off to room air, no cough, phlegm production, any other signs of pneumonia when noted.  Discussion by problem: #1. Acute on chronic renal failure, likely dehydration related, Improved, ultrasound of kidneys showed no hydronephrosis. Urinalysis revealed pyuria, concerning for urinary tract infection,  urine culture, however, revealed multiple species, recollection was suggested. Patient received a few doses of Rocephin while in the hospital, repeated urine culture is pending, recommend to follow up as outpatient   #2. Urinary tract infection versus sterile pyuria, urinary culture is pending, patient is asymptomatic, follow culture results as outpatient and initiate antibiotic therapy depending on culture results.  #3. Diarrhea, likely viral, stool cultures, including C. difficile were negative, diarrhea has subsided , recommended to continue octreotide as previously scheduled at 100 g subcutaneously every 8 hours, discussed with Dr. Mike Gip, care management #4. Acute respiratory failure with hypoxia of unclear etiology, suspected some aspiration, VQ scan was low probability for PE, chest x-ray was unremarkable , CT of chest revealed right middle lobe atelectasis, likely aspiration, but no pneumonia, patient was weaned off oxygen, now a she is on the room air and is comfortable  #5. Metabolic acidosis, likely diarrhea related, initiate patient on bicarbonate orally, discontinue bicarbonate tablets. When patient's diarrhea resolves with octreotide.  #6. Generalized weakness, get physical therapy at  home, order home health services  DISCHARGE CONDITIONS:   Stable  CONSULTS OBTAINED:  Treatment Team:  Erby Pian, MD  DRUG ALLERGIES:  No Known Allergies  DISCHARGE MEDICATIONS:   Current Discharge Medication List    START taking these medications   Details  feeding supplement (BOOST / RESOURCE BREEZE) LIQD Take 1 Container by mouth 2 (two) times daily between meals. Qty: 60 Container, Refills: 5    sodium bicarbonate 650 MG tablet Take 1 tablet (650 mg total) by mouth 2 (two) times daily. Qty: 60 tablet, Refills: 6      CONTINUE these medications which have NOT CHANGED   Details  ALPRAZolam (XANAX) 0.25 MG tablet Take 0.25 mg by mouth at bedtime as needed for anxiety.     diphenoxylate-atropine (LOMOTIL) 2.5-0.025 MG tablet Take 1 tablet by mouth 4 (four) times daily as needed for diarrhea or loose stools. Qty: 30 tablet, Refills: 0    loperamide ([name] A-D) 2 MG tablet Take 1 tablet (2 mg total) by mouth 4 (four) times daily as needed for diarrhea or loose stools. Qty: 30 tablet, Refills: 0    octreotide (SANDOSTATIN) 100 MCG/ML SOLN injection Inject 1 mL (100 mcg total) into the skin every 8 (eight) hours. Qty: 13 mL, Refills: 0    potassium chloride 20 MEQ TBCR Take 20 mEq by mouth 2 (two) times daily. Qty: 60 tablet, Refills: 0    Syringe, Disposable, 1 ML MISC 1 Units by Does not apply route 3 (three) times daily. Qty: 25 each, Refills: 0         DISCHARGE INSTRUCTIONS:    Patient is to follow-up with primary care physician, oncologist as outpatient  If you experience worsening of your admission symptoms, develop shortness of breath, life threatening emergency, suicidal or homicidal thoughts you must seek medical attention immediately by calling 911 or calling your MD immediately  if symptoms less severe.  You Must read complete instructions/literature along with all the possible adverse reactions/side effects for all the Medicines you take and that  have been prescribed to you. Take any new Medicines after you have completely understood and accept all the possible adverse reactions/side effects.   Please note  You were cared for by a hospitalist during your hospital stay. If you have any questions about your discharge medications or the care you received while you were in the hospital after you are discharged, you can call the unit and asked to speak with the hospitalist on call if the hospitalist that took care of you is not available. Once you are discharged, your primary care physician will handle any further medical issues. Please note that NO REFILLS for any discharge medications will be authorized once you are discharged, as it is imperative that you return to your primary care physician (or establish a relationship with a primary care physician if you do not have one) for your aftercare needs so that they can reassess your need for medications and monitor your lab values.    Today   CHIEF COMPLAINT:   Chief Complaint  Patient presents with  . Abdominal Pain  . Nausea    HISTORY OF PRESENT ILLNESS:  Mashell Faunce  is a 70 y.o. female with a known history of endometrial cancer, who presents to the hospital with complaints of nausea, abdominal pain, weakness. On arrival to emergency room, she was noted to be in acute renal failure, tachycardic, dehydrated and was admitted for IV fluid infusion. With this conservative therapy the patient's  condition improved, her kidney function improved to GFR of  53, patient's baseline.  Patient's diarrhea subsided, oral intake has improved. Stool cultures taken in the hospital revealed no growth, it was negative for C. difficile. Patient's labs, however, revealed metabolic acidosis, concerning for bicarbonate loss with diarrheal stool, sodium bicarbonate was initiated orally. Patient was advised to continue octreotide subcutaneously, as previously prescribed by Dr. Mike Gip, discontinue bicarbonate  when the diarrhea subsides. While in the hospital patient was noted to be severely hypoxic, requiring high flow oxygen per nasal cannulas, VQ scan was negative as well as chest x-ray, CT scan of the chest revealed atelectasis in the right middle lobe, concerning for possible aspiration. Patient was given incentive spirometer, nebulizing therapy and improved, weaned off to room air, no cough, phlegm production, any other signs of pneumonia when noted.  Discussion by problem: #1. Acute on chronic renal failure, likely dehydration related, Improved, ultrasound of kidneys showed no hydronephrosis. Urinalysis revealed pyuria, concerning for urinary tract infection,  urine culture, however, revealed multiple species, recollection was suggested. Patient received a few doses of Rocephin while in the hospital, repeated urine culture is pending, recommend to follow up as outpatient   #2. Urinary tract infection versus sterile pyuria, urinary culture is pending, patient is asymptomatic, follow culture results as outpatient and initiate antibiotic therapy depending on culture results.  #3. Diarrhea, likely viral, stool cultures, including C. difficile were negative, diarrhea has subsided , recommended to continue octreotide as previously scheduled at 100 g subcutaneously every 8 hours, discussed with Dr. Mike Gip, care management #4. Acute respiratory failure with hypoxia of unclear etiology, suspected some aspiration, VQ scan was low probability for PE, chest x-ray was unremarkable , CT of chest revealed right middle lobe atelectasis, likely aspiration, but no pneumonia, patient was weaned off oxygen, now a she is on the room air and is comfortable  #5. Metabolic acidosis, likely diarrhea related, initiate patient on bicarbonate orally, discontinue bicarbonate tablets. When patient's diarrhea resolves with octreotide.  #6. Generalized weakness, get physical therapy at home, order home health services     VITAL  SIGNS:  Blood pressure 118/68, pulse 83, temperature 98.2 F (36.8 C), temperature source Oral, resp. rate 18, height 5\' 2"  (1.575 m), weight 72.6 kg (160 lb 1.6 oz), SpO2 100 %.  I/O:   Intake/Output Summary (Last 24 hours) at 03/28/16 1205 Last data filed at 03/28/16 0654  Gross per 24 hour  Intake                0 ml  Output              600 ml  Net             -600 ml    PHYSICAL EXAMINATION:  GENERAL:  70 y.o.-year-old patient lying in the bed with no acute distress.  EYES: Pupils equal, round, reactive to light and accommodation. No scleral icterus. Extraocular muscles intact.  HEENT: Head atraumatic, normocephalic. Oropharynx and nasopharynx clear.  NECK:  Supple, no jugular venous distention. No thyroid enlargement, no tenderness.  LUNGS: Normal breath sounds bilaterally, no wheezing, rales,rhonchi or crepitation. No use of accessory muscles of respiration.  CARDIOVASCULAR: S1, S2 normal. No murmurs, rubs, or gallops.  ABDOMEN: Soft, non-tender, non-distended. Bowel sounds present. No organomegaly or mass.  EXTREMITIES: No pedal edema, cyanosis, or clubbing.  NEUROLOGIC: Cranial nerves II through XII are intact. Muscle strength 5/5 in all extremities. Sensation intact. Gait not checked.  PSYCHIATRIC: The patient is alert and  oriented x 3.  SKIN: No obvious rash, lesion, or ulcer.   DATA REVIEW:   CBC  Recent Labs Lab 03/27/16 0421  WBC 10.0  HGB 11.4*  HCT 34.1*  PLT 242    Chemistries   Recent Labs Lab 03/25/16 1527  03/28/16 0432  NA 139  < > 140  K 3.9  < > 4.3  CL 115*  < > 118*  CO2 13*  < > 14*  GLUCOSE 120*  < > 125*  BUN 37*  < > 19  CREATININE 2.15*  < > 1.18*  CALCIUM 8.0*  < > 8.1*  MG 1.8  --   --   AST 35  --   --   ALT 25  --   --   ALKPHOS 175*  --   --   BILITOT 0.3  --   --   < > = values in this interval not displayed.  Cardiac Enzymes No results for input(s): TROPONINI in the last 168 hours.  Microbiology Results  Results for  orders placed or performed during the hospital encounter of 03/25/16  C difficile quick scan w PCR reflex     Status: None   Collection Time: 03/25/16  4:53 PM  Result Value Ref Range Status   C Diff antigen NEGATIVE NEGATIVE Final   C Diff toxin NEGATIVE NEGATIVE Final   C Diff interpretation No C. difficile detected.  Final  Urine culture     Status: Abnormal   Collection Time: 03/26/16  2:28 PM  Result Value Ref Range Status   Specimen Description URINE, CATHETERIZED  Final   Special Requests NONE  Final   Culture MULTIPLE SPECIES PRESENT, SUGGEST RECOLLECTION (A)  Final   Report Status 03/27/2016 FINAL  Final  Gastrointestinal Panel by PCR , Stool     Status: None   Collection Time: 03/26/16  4:47 PM  Result Value Ref Range Status   Campylobacter species NOT DETECTED NOT DETECTED Final   Plesimonas shigelloides NOT DETECTED NOT DETECTED Final   Salmonella species NOT DETECTED NOT DETECTED Final   Yersinia enterocolitica NOT DETECTED NOT DETECTED Final   Vibrio species NOT DETECTED NOT DETECTED Final   Vibrio cholerae NOT DETECTED NOT DETECTED Final   Enteroaggregative E coli (EAEC) NOT DETECTED NOT DETECTED Final   Enteropathogenic E coli (EPEC) NOT DETECTED NOT DETECTED Final   Enterotoxigenic E coli (ETEC) NOT DETECTED NOT DETECTED Final   Shiga like toxin producing E coli (STEC) NOT DETECTED NOT DETECTED Final   E. coli O157 NOT DETECTED NOT DETECTED Final   Shigella/Enteroinvasive E coli (EIEC) NOT DETECTED NOT DETECTED Final   Cryptosporidium NOT DETECTED NOT DETECTED Final   Cyclospora cayetanensis NOT DETECTED NOT DETECTED Final   Entamoeba histolytica NOT DETECTED NOT DETECTED Final   Giardia lamblia NOT DETECTED NOT DETECTED Final   Adenovirus F40/41 NOT DETECTED NOT DETECTED Final   Astrovirus NOT DETECTED NOT DETECTED Final   Norovirus GI/GII NOT DETECTED NOT DETECTED Final   Rotavirus A NOT DETECTED NOT DETECTED Final   Sapovirus (I, II, IV, and V) NOT DETECTED  NOT DETECTED Final    RADIOLOGY:  Ct Chest Wo Contrast  Result Date: 03/27/2016 CLINICAL DATA:  70 year old female with hypoxia and weakness. History of endometrial cancer. EXAM: CT CHEST WITHOUT CONTRAST TECHNIQUE: Multidetector CT imaging of the chest was performed following the standard protocol without IV contrast. COMPARISON:  02/16/2016 PET-CT and prior CTs FINDINGS: Cardiovascular: Coronary artery and thoracic aortic atherosclerotic calcifications again  noted. There is no evidence of thoracic aortic aneurysm. Mediastinum/Nodes: There is no evidence of mediastinal mass, abnormal appearing mediastinal or hilar lymph nodes or pericardial effusion. A right Port-A-Cath is identified with tip in the lower SVC. Lungs/Pleura: Trace bilateral pleural effusions are noted. Right subsegmental anterior mid lung atelectasis/ scarring again noted. Mild bibasilar atelectasis is present. There is no evidence of airspace disease, consolidation or pneumothorax. A 4 mm left apical nodule is unchanged. Upper Abdomen: A right retrocrural lymph node has decreased in size, now 0.9 cm (image 52), previously 1.2 cm. Hepatic steatosis and multiple renal cysts again noted. Musculoskeletal: No acute or suspicious abnormalities identified. Mild superior endplate compression of L1 again noted. IMPRESSION: Trace bilateral pleural effusions with right subsegmental mid lung atelectasis/scarring and mild bibasilar atelectasis. Decreasing size of right retrocrural lymph node. Coronary artery disease, thoracic aortic atherosclerosis and hepatic steatosis again noted. Electronically Signed   By: Margarette Canada M.D.   On: 03/27/2016 12:11  Nm Pulmonary Perf And Vent  Result Date: 03/26/2016 CLINICAL DATA:  Hypoxia and weakness EXAM: NUCLEAR MEDICINE VENTILATION - PERFUSION LUNG SCAN TECHNIQUE: Ventilation images were obtained in multiple projections using inhaled aerosol Tc-28m DTPA. Perfusion images were obtained in multiple projections  after intravenous injection of Tc-75m MAA. RADIOPHARMACEUTICALS:  32.53 mCi Technetium-15m DTPA aerosol inhalation and 4.368 mCi Technetium-6m MAA IV COMPARISON:  Chest x-ray from earlier in the same day FINDINGS: Ventilation: Ventilation images demonstrate some central clumping although no definitive ventilation defect is seen. Perfusion: No wedge shaped peripheral perfusion defects to suggest acute pulmonary embolism. IMPRESSION: No ventilation-perfusion mismatch to suggest pulmonary embolism is noted. Electronically Signed   By: Inez Catalina M.D.   On: 03/26/2016 16:37  US Venous Img Lower Unilateral Left  Result Date: 03/26/2016 CLINICAL DATA:  70 year old female with a history of calf pain EXAM: LEFT LOWER EXTREMITY VENOUS DOPPLER ULTRASOUND TECHNIQUE: Gray-scale sonography with graded compression, as well as color Doppler and duplex ultrasound were performed to evaluate the lower extremity deep venous systems from the level of the common femoral vein and including the common femoral, femoral, profunda femoral, popliteal and calf veins including the posterior tibial, peroneal and gastrocnemius veins when visible. The superficial great saphenous vein was also interrogated. Spectral Doppler was utilized to evaluate flow at rest and with distal augmentation maneuvers in the common femoral, femoral and popliteal veins. COMPARISON:  None. FINDINGS: Contralateral Common Femoral Vein: Respiratory phasicity is normal and symmetric with the symptomatic side. No evidence of thrombus. Normal compressibility. Common Femoral Vein: No evidence of thrombus. Normal compressibility, respiratory phasicity and response to augmentation. Saphenofemoral Junction: No evidence of thrombus. Normal compressibility and flow on color Doppler imaging. Profunda Femoral Vein: No evidence of thrombus. Normal compressibility and flow on color Doppler imaging. Femoral Vein: No evidence of thrombus. Normal compressibility, respiratory  phasicity and response to augmentation. Popliteal Vein: No evidence of thrombus. Normal compressibility, respiratory phasicity and response to augmentation. Calf Veins: No evidence of thrombus. Normal compressibility and flow on color Doppler imaging. Superficial Great Saphenous Vein: No evidence of thrombus. Normal compressibility and flow on color Doppler imaging. Other Findings:  None. IMPRESSION: Sonographic survey of the left lower extremity negative for DVT. Signed, Dulcy Fanny. Earleen Newport, DO [facility] Electronically Signed   By: Corrie Mckusick D.O.   On: 03/26/2016 14:11   EKG:   Orders placed or performed during the hospital encounter of 03/18/16  . ED EKG  . ED EKG  Management plans discussed with the patient, family and they are in agreement.  CODE STATUS:     Code Status Orders        Start     Ordered   03/25/16 1942  Full code  Continuous     03/25/16 1941    Code Status History    Date Active Date Inactive Code Status Order ID Comments User Context   03/18/2016 11:46 AM 03/23/2016  7:42 PM Full Code [id number]  Lytle Butte, MD ED      TOTAL TIME TAKING CARE OF THIS PATIENT: 40  minutes.    Theodoro Grist M.D on 03/28/2016 at 12:05 PM  Between 7am to 6pm - Pager - [phone]  After 6pm go to [email or web address] - password [email or web address] [facility]  [facility]  Office  [phone]  CC: Primary care physician; [name], MD

## 2016-03-28 NOTE — Progress Notes (Unsigned)
[facility] day:  03/28/16   Chief Complaint: [name] is a 70 y.o. female with a history of stage III endometrial cancer who is seen for assessment prior to cycle #2 carboplatin and Taxol.   HPI:  The patient was last seen by me in the medical oncology clinic on 03/07/2016.  At that time, she received cycle #1 carboplatin and Taxol.  She was admitted to [facility] from 03/18/2016 - 03/23/2016 with acute renal failure secondary to dehydration from diarrhea.  She was treated with IVF and anti-emetics.  [name] and Lomotil were unsuccessful.  Questran was tried.  Her diarrhea improved with sandostatin 100 mcg SQ q 8 hours.   Past Medical History:  Diagnosis Date  . Cancer [facility]) 2011   endometrial  . Chronic kidney disease    STONES  . Diverticulitis of colon (without mention of hemorrhage)   . History of kidney stones 2011  . Mechanical complication of colostomy and enterostomy   . Obesity, unspecified   . Personal history of tobacco use, presenting hazards to health     Past Surgical History:  Procedure Laterality Date  . ABDOMINAL HYSTERECTOMY  2011  . APPENDECTOMY    . CHOLECYSTECTOMY  05/07/14   Incidental during ventral hernia repair.   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 COLON SURGERY  05/20/1999   Drainage of pelvic abscess, end colostomy  . COLON SURGERY  11/15/2010   Revision of colostomy stoma  . COLONOSCOPY  June 2010,03/31/14   Tubular adenoma of the transverse colon, hyperplastic polyps of the sigmoid.  Marland Kitchen COLOSTOMY  2011  . DILATION AND CURETTAGE OF UTERUS    . HERNIA REPAIR  04/06/14   ventral hernia, tetro rectus Ventralex ST mesh, 20 x 33 cm.   Marland Kitchen HYSTEROSCOPY  2011  . LITHOTRIPSY  2009  . MOLE REMOVAL  2003  . PORTACATH PLACEMENT N/A 03/01/2016   Procedure: INSERTION PORT-A-CATH;  Surgeon: [name], MD;  Location: [facility]  ORS;  Service: General;  Laterality: N/A;  . PYELOLITHOTOMY    . skin cancer removal  02/2014  . TUBAL LIGATION      Family History  Problem Relation Age of Onset  . Breast cancer Maternal Grandmother   . Colon cancer Mother   . Cancer Mother     Social History:  reports that she has been smoking Cigarettes.  She has a 3.75 pack-year smoking history. She has never used smokeless tobacco. She reports that she does not drink alcohol or use drugs.  She smokes 7 cigarettes a day plus vapor cigarettes.  She doesn't want to quit smoking.  She is a retired Marine scientist.  The patient is accompaied by her husband,  [name], today.  Allergies: No Known Allergies  Current Medications: No current facility-administered medications for this visit.    No current outpatient prescriptions on file.   Facility-Administered Medications Ordered in Other Visits  Medication Dose Route Frequency Provider Last Rate Last Dose  . acetaminophen (TYLENOL) tablet 650 mg  650 mg Oral Q6H PRN Baxter Hire, MD       Or  . acetaminophen (TYLENOL) suppository 650 mg  650 mg Rectal Q6H PRN Baxter Hire, MD      . ALPRAZolam [name]) tablet 0.25 mg  0.25 mg Oral QHS PRN Baxter Hire, MD   0.25 mg at 03/26/16 2025  . budesonide (  PULMICORT) nebulizer solution 0.25 mg  0.25 mg Nebulization BID Theodoro Grist, MD      . cefTRIAXone (ROCEPHIN) 1 g in dextrose 5 % 50 mL IVPB  1 g Intravenous Q24H Theodoro Grist, MD   1 g at 03/27/16 1329  . diphenoxylate-atropine (LOMOTIL) 2.5-0.025 MG per tablet 1 tablet  1 tablet Oral QID PRN Baxter Hire, MD   1 tablet at 03/25/16 2033  . feeding supplement (BOOST / RESOURCE BREEZE) liquid 1 Container  1 Container Oral BID BM Theodoro Grist, MD   1 Container at 03/27/16 1329  . ipratropium-albuterol (DUONEB) 0.5-2.5 (3) MG/3ML nebulizer solution 3 mL  3 mL Nebulization Q4H Theodoro Grist, MD      . loperamide ([name]) capsule 2 mg  2 mg Oral QID PRN Baxter Hire, MD      .  methylPREDNISolone sodium succinate (SOLU-MEDROL) 125 mg/2 mL injection 60 mg  60 mg Intravenous Q24H Theodoro Grist, MD   60 mg at 03/27/16 0900  . octreotide (SANDOSTATIN) injection 100 mcg  100 mcg Subcutaneous Q8H Baxter Hire, MD   100 mcg at 03/27/16 2325  . ondansetron (ZOFRAN) injection 4 mg  4 mg Intravenous Q6H PRN Baxter Hire, MD   4 mg at 03/25/16 2033    Review of Systems:  GENERAL:  Fatigue.  No fevers or sweats.  Weight loss of 9 pounds. PERFORMANCE STATUS (ECOG):  1 HEENT:  No visual changes, runny nose, sore throat, mouth sores or tenderness. Lungs: No shortness of breath or cough.  No hemoptysis. Cardiac:  No chest pain, palpitations, orthopnea, or PND. GI:  Abdominal discomfort and cramps.  Watery diarrhea, no change.  No nausea, vomiting, constipation, melena or hematochezia. GU:  No urgency, frequency, dysuria, or hematuria. Musculoskeletal:  No back pain.  No joint pain.  No muscle tenderness. Extremities:  No pain or swelling. Skin:  No rashes or skin changes. Neuro:  No headache, numbness or weakness, balance or coordination issues. Endocrine:  No diabetes, thyroid issues, hot flashes or night sweats. Psych:  Poor sleep.  No mood changes, depression or anxiety. Pain: Abdominal discomfort/pain. Review of systems:  All other systems reviewed and found to be negative.  Physical Exam: There were no vitals taken for this visit. GENERAL:  Well developed, well nourished, woman sitting comfortably in the exam room in no acute distress. MENTAL STATUS:  Alert and oriented to person, place and time. HEAD:  Short gray hair.  Normocephalic, atraumatic, face symmetric, no Cushingoid features. EYES:  Blue eyes.  Pupils equal round and reactive to light and accomodation.  No conjunctivitis or scleral icterus. ENT:  Oropharynx clear without lesion.  Tongue normal. Mucous membranes moist.  RESPIRATORY:  Clear to auscultation without rales, wheezes or rhonchi. CARDIOVASCULAR:   Regular rate and rhythm without murmur, rub or gallop. ABDOMEN:  Colostomy.  Soft, non-tender, with active bowel sounds, and no hepatosplenomegaly.  No masses. SKIN:  No rashes, ulcers or lesions. EXTREMITIES: No edema, no skin discoloration or tenderness.  No palpable cords. LYMPH NODES: No palpable cervical, supraclavicular, axillary or inguinal adenopathy  NEUROLOGICAL: Unremarkable. PSYCH:  Appropriate.  No visits with results within 3 Day(s) from this visit. Latest known visit with results is:  Hospital Outpatient Visit on 02/21/2016  Component Date Value Ref Range Status  . aPTT 02/21/2016 34  24 - 36 seconds Final  . Prothrombin Time 02/21/2016 14.1  11.4 - 15.0 seconds Final  . INR 02/21/2016 1.07   Final  . SURGICAL  PATHOLOGY 02/21/2016    Final-Edited                   Value:Surgical Pathology THIS IS AN ADDENDUM REPORT CASE: [id number] PATIENT: [facility] Surgical Pathology Report Addendum  Reason for Addendum #1:  Immunohistochemistry results  SPECIMEN SUBMITTED: A. Pelvic mass, right B. Pelvic mass, right  CLINICAL HISTORY: T3N2M0 (stage III c2) endometrial carcinoma, grade 3 in 2011  PRE-OPERATIVE DIAGNOSIS: Probable recurrent endometrial carcinoma involving 4-5 cm right pelvic sidewall mass and high left aortic node  POST-OPERATIVE DIAGNOSIS: Same as pre op     DIAGNOSIS: A. PELVIC MASS, RIGHT; BIOPSY: - HIGH GRADE METASTATIC ADENOCARCINOMA, MORPHOLOGICALLY CONSISTENT WITH PATIENT'S KNOWN HISTORY OF ENDOMETRIAL CARCINOMA.  B. PELVIC MASS, RIGHT; CT-GUIDED BIOPSY: - HIGH GRADE METASTATIC ADENOCARCINOMA, MORPHOLOGICALLY CONSISTENT WITH PATIENT'S KNOWN HISTORY OF ENDOMETRIAL CARCINOMA.  Comment: The patient has a history of poorly differentiated endometrial adenocarcinoma (FIGO                          3)  diagnosed in June 2011. A limited panel of immunohistochemical stains was performed to sub classify the type of endometrial carcinoma.  Stain results: p53: positive (focal strong subset in a background of wild type pattern)  ER: positive (diffuse) p 16: positive (focal strong subset) Napsin: negative Stain controls worked appropriately. This pattern of immunoreactivity is consistent with high grade endometrial adenocarcinoma, endometrioid type with focal aberrant expression of p53 and p16.  The staining pattern is not consistent with serous carcinoma. Preliminary findings were communicated to Dr. Rogue Bussing on 02/22/2016.  GROSS DESCRIPTION:  A. Labeled: right pelvic mass  Tissue fragment(s): 2  Size: cores of pink-tan tissue, and aggregate 2.0 x 0.1 x 0.1 cm  Description: cores received in saline, touch prep prepared with Diff Quik stain slide, following staining instructed by Dr. Reuel Derby entirely formalin fixed, wrapped in lens paper, marked blue and submit                         ted in a mesh bag  Entirely submitted in one cassette(s).   B. Labeled: right pelvic mass  Tissue fragment(s): 3  Size: cores of pink-tan tissue 0.3-2 cm in length and in diameter 0.1 cm  Description: pink-tan course, wrapped in lens paper, marked blue and submitted in a mesh bag  Entirely submitted in 1 cassette(s).     Final Diagnosis performed by Quay Burow, MD.  Electronically signed 02/24/2016 1:43:43PM   The electronic signature indicates that the named Attending Pathologist has evaluated the specimen  Technical component performed at [facility], [address] [place], [place] [address] Lab: [phone] Dir: Darrick Penna. Evette Doffing, MD  Professional component performed at [facility], [facility], [place], [place], [place] [address] Lab: [phone] Dir: Dellia Nims. Reuel Derby, MD   ADDENDUM: Immunohistochemistry (IHC) Testing for Mismatch Repair (MMR) Proteins:  Results:  MLH1: Intact nuclear expression MSH2: Intact nuclear exp                         ression MSH6: Intact nuclear  expression PMS2: Intact nuclear expression  IHC Interpretation: No loss of nuclear expression of MMR proteins: Low probability of MSI-H.  Testing was performed on block: B1  IHC slides were prepared by [facility], [place], [place], and interpreted by Dr. Dicie Beam. All controls stained appropriately.    Addendum #1 performed by Bryan Lemma,  MD.  Electronically signed 02/29/2016 5:34:54PM    Technical component performed at [facility], [address], [place], [place] [address] Lab: [phone] Dir: Darrick Penna. Evette Doffing, MD  Professional component performed at [facility], [facility], [place], [place], [place] [address] Lab: [phone] Dir: Dellia Nims. Reuel Derby, MD      Assessment:  [name] is a 70 y.o. female with a history of stage III endometrial carcinoma stats post TAH/BSO in 02/2010.  Pathology revealed grade III endometrial carcinoma.  Tumor was 3 x 3 cm and extended through the thickness of the myometrium.  There were 2 of 6 right pelvic lymph nodes and 2 of 6 periaortic nodes involved with metastatic disease. Peritoneal washings were negative.  Pathologic stage was T3N2M0 (stage III c2).  She received 6 cycles of carboplatin and Taxol from 03/27/2010 until 08/23/2010.  She received brachytherapy (completed 11/2010).  She developed a grade I/II neuropathy.  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 Following re-anastomosis, she developed a leak and underwent diverting colostomy.  She has had 3 incisional hernias.  PET scan on 02/16/2016 revealed a 4.4 x 5.0 cm hypermetabolic soft tissue mass along the right pelvic sidewall, presumably malignant metastatic lymphadenopathy.  There was also metastatic lymphadenopathy in the left para-aortic nodal station adjacent to the left renal hilum, and in the right retrocrural nodal station.  There was a metastatic lesion in the right ilium.  There was evidence  of persistent partial small bowel obstruction.  CA125 results are as follows: 19 on 01/19/2014, 23.6 on 07/23/2014, 40.5 on 01/26/2015, 102.5 on 02/08/2016, and 82.7 on 03/07/2016.  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  MSI testing is pending.   She is s/p 1 cycle of carboplatin and Taxol (03/07/2016).  Course was complicated by an admission on 03/18/2016 with acute renal failure secondary to dehydration caused by diarrhea and poor oral intake.  She has chronic renal insufficiency.  CrCl is 32.6 ml/min.  Symptomatically, she has crampy abdominal discomfort.  She has chronic diarrhea.  Exam is stable.  Weight is down.  She has hypomagnesemia.  Plan: 1.  Labs today:  CBC with diff, CMP, Mg, CA125.  2.  Review chemotherapy plans.  No plans for Neulasta with cycle #1.  Monitor counts closely.  Patient consented to treatment.. 3.  Cycle #1 carboplatin and Taxol. 4.  Discuss importance of hydration.  Discuss caloric intake.  Discuss management of diarrhea. 5.  RTC on 03/16/2016 for labs (CBC with diff, BMP, Mg). 6.  RTC in 3 weeks for MD assessment, labs (CBC with diff, CMP, Mg, CA125), and cycle #2 carboplatin and Taxol.   Lequita Asal, MD  03/28/2016, 6:35 AM

## 2016-03-28 NOTE — Progress Notes (Signed)
Discharge paperwork reviewed with patient who verbalized understanding. Prescriptions sent electronically to [facility]. Patient's husband to transport home.

## 2016-03-29 LAB — URINE CULTURE: CULTURE: NO GROWTH

## 2016-03-30 LAB — BLOOD GAS, VENOUS
Acid-base deficit: 12.9 mmol/L — ABNORMAL HIGH (ref 0.0–2.0)
Bicarbonate: 14.5 mEq/L — ABNORMAL LOW (ref 21.0–28.0)
Patient temperature: 37
pCO2, Ven: 38 mmHg — ABNORMAL LOW (ref 44.0–60.0)
pH, Ven: 7.19 — CL (ref 7.320–7.430)
pO2, Ven: <31 mmHg — ABNORMAL LOW (ref 31.0–45.0)

## 2016-04-01 NOTE — Progress Notes (Signed)
[facility] day:  04/02/16   Chief Complaint: [name] is a 70 y.o. female with recurrent stage III endometrial cancer who is seen for reassessment after interval hospitalizations.  HPI:  The patient was last seen by me in the medical oncology clinic on 03/07/2016.  At that time, she received cycle #1 carboplatin and Taxol.  She was admitted to [facility] from 03/18/2016 - 03/23/2016 with acute renal failure secondary to dehydration from diarrhea.  She was treated with IVF and anti-emetics.  [name] and Lomotil were unsuccessful.  Questran was tried.  Her diarrhea improved with sandostatin 100 mcg SQ q 8 hours.  She was readmited to [facility] from 03/25/2016 - 03/28/2016 with acute renal failure secondary to diarrhea, nausea, abdominal pain, and weakness.   Stool was negative for C diff.  Sodium bicarbonate was instituted for a low bicarbonate secondary to her diarrhea.  She continued her octreotide.  While in the hospital, she had an episode of hypoxia, requiring high flow oxygen.  VQ scan was negative.  Chest CT scan of the chest revealed atelectasis in the right middle lobe, concerning for possible aspiration. Patient was given incentive spirometry and nebulizer therapy .  She improved and was weaned off  supplemental oxygen back to room air.   Since discharge, the patient feels "as well as I can".  She describes "puttering around the house".  She has continued her octreotide every 8 hours.  Ostomy output is pudding and not water.  She sees Dr. Allen Norris today.  She notes new onset lower extremity edema.  Her weight is up 6 pounds.   Past Medical History:  Diagnosis Date  . Cancer [facility]) 2011   endometrial  . Chronic kidney disease    STONES  . Diverticulitis of colon (without mention of hemorrhage)   . History of kidney stones 2011  . Mechanical complication of colostomy and enterostomy   . Obesity, unspecified   . Personal history of tobacco use,  presenting hazards to health     Past Surgical History:  Procedure Laterality Date  . ABDOMINAL HYSTERECTOMY  2011  . APPENDECTOMY    . CHOLECYSTECTOMY  05/07/14   Incidental during ventral hernia repair.   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 COLON SURGERY  05/20/1999   Drainage of pelvic abscess, end colostomy  . COLON SURGERY  11/15/2010   Revision of colostomy stoma  . COLONOSCOPY  June 2010,03/31/14   Tubular adenoma of the transverse colon, hyperplastic polyps of the sigmoid.  [facility] COLOSTOMY  2011  . DILATION AND CURETTAGE OF UTERUS    . HERNIA REPAIR  04/06/14   ventral hernia, tetro rectus Ventralex ST mesh, 20 x 33 cm.   [facility] HYSTEROSCOPY  2011  . LITHOTRIPSY  2009  . MOLE REMOVAL  2003  . PORTACATH PLACEMENT N/A 03/01/2016   Procedure: INSERTION PORT-A-CATH;  Surgeon: [name], MD;  Location: [facility] ORS;  Service: General;  Laterality: N/A;  . PYELOLITHOTOMY    . skin cancer removal  02/2014  . TUBAL LIGATION      Family History  Problem Relation Age of Onset  . Breast cancer Maternal Grandmother   . Colon cancer Mother   . Cancer Mother     Social History:  reports that she has been smoking Cigarettes.  She has a 3.75 pack-year smoking history. She has never used smokeless tobacco.  She reports that she does not drink alcohol or use drugs.  She smokes 7 cigarettes a day plus vapor cigarettes.  She doesn't want to quit smoking.  She is a retired Marine scientist.  The patient is accompaied by her husband,  [name], today.  Allergies: No Known Allergies  Current Medications: Current Outpatient Prescriptions  Medication Sig Dispense Refill  . ALPRAZolam (XANAX) 0.25 MG tablet Take 0.25 mg by mouth at bedtime as needed for anxiety.     . diphenoxylate-atropine (LOMOTIL) 2.5-0.025 MG tablet Take 1 tablet by mouth 4 (four) times daily as needed for diarrhea or loose stools. 30 tablet 0  . feeding  supplement (BOOST / RESOURCE BREEZE) LIQD Take 1 Container by mouth 2 (two) times daily between meals. 60 Container 5  . HYDROcodone-acetaminophen (NORCO/VICODIN) 5-325 MG tablet     . KLOR-CON M20 20 MEQ tablet     . loperamide ([name] A-D) 2 MG tablet Take 1 tablet (2 mg total) by mouth 4 (four) times daily as needed for diarrhea or loose stools. 30 tablet 0  . octreotide (SANDOSTATIN) 100 MCG/ML SOLN injection Inject 1 mL (100 mcg total) into the skin every 8 (eight) hours. 13 mL 0  . ondansetron (ZOFRAN) 8 MG tablet     . potassium chloride 20 MEQ TBCR Take 20 mEq by mouth 2 (two) times daily. 60 tablet 0  . sertraline (ZOLOFT) 50 MG tablet     . sodium bicarbonate 650 MG tablet Take 1 tablet (650 mg total) by mouth 2 (two) times daily. 60 tablet 6  . Syringe, Disposable, 1 ML MISC 1 Units by Does not apply route 3 (three) times daily. 25 each 0   No current facility-administered medications for this visit.     Review of Systems:  GENERAL:  Fatigue.  No fevers or sweats.  Weight up 6 pounds. PERFORMANCE STATUS (ECOG):  2 HEENT:  No visual changes, runny nose, sore throat, mouth sores or tenderness. Lungs: No shortness of breath or cough.  No hemoptysis. Cardiac:  No chest pain, palpitations, orthopnea, or PND. GI:  Ostomy output with consistency of pudding (improved) on octreotide.  No nausea, vomiting, constipation, melena or hematochezia. GU:  No urgency, frequency, dysuria, or hematuria. Musculoskeletal:  No back pain.  No joint pain.  No muscle tenderness. Extremities:  No pain or swelling. Skin:  No rashes or skin changes. Neuro:  No headache, numbness or weakness, balance or coordination issues. Endocrine:  No diabetes, thyroid issues, hot flashes or night sweats. Psych:  No mood changes, depression or anxiety. Pain:  No pain. Review of systems:  All other systems reviewed and found to be negative.  Physical Exam: Blood pressure 109/69, pulse 93, temperature 97.4 F (36.3  C), temperature source Tympanic, weight 166 lb 7.2 oz (75.5 kg), SpO2 91 %. GENERAL:  Elderly woman sitting comfortably in a wheelchair in the exam room in no acute distress. MENTAL STATUS:  Alert and oriented to person, place and time. HEAD:  Short gray hair.  Normocephalic, atraumatic, face symmetric, no Cushingoid features. EYES:  Blue eyes.  Pupils equal round and reactive to light and accomodation.  No conjunctivitis or scleral icterus. ENT:  Oropharynx clear without lesion.  Tongue normal. Mucous membranes moist.  RESPIRATORY:  Clear to auscultation without rales, wheezes or rhonchi. CARDIOVASCULAR:  Regular rate and rhythm without murmur, rub or gallop. ABDOMEN:  Colostomy.  Soft, non-tender, with active bowel sounds, and no hepatosplenomegaly.  No masses. SKIN:  No rashes, ulcers or lesions.  EXTREMITIES: Bilateral lower extremity edema.  No skin discoloration or tenderness.  No palpable cords. LYMPH NODES: No palpable cervical, supraclavicular, axillary or inguinal adenopathy  NEUROLOGICAL: Unremarkable. PSYCH:  Appropriate.  No visits with results within 3 Day(s) from this visit. Latest known visit with results is:  Hospital Outpatient Visit on 02/21/2016  Component Date Value Ref Range Status  . aPTT 02/21/2016 34  24 - 36 seconds Final  . Prothrombin Time 02/21/2016 14.1  11.4 - 15.0 seconds Final  . INR 02/21/2016 1.07   Final  . SURGICAL PATHOLOGY 02/21/2016    Final-Edited                   Value:Surgical Pathology THIS IS AN ADDENDUM REPORT CASE: [id number] PATIENT: [facility] Surgical Pathology Report Addendum  Reason for Addendum #1:  Immunohistochemistry results  SPECIMEN SUBMITTED: A. Pelvic mass, right B. Pelvic mass, right  CLINICAL HISTORY: T3N2M0 (stage III c2) endometrial carcinoma, grade 3 in 2011  PRE-OPERATIVE DIAGNOSIS: Probable recurrent endometrial carcinoma involving 4-5 cm right pelvic sidewall mass and high left aortic  node  POST-OPERATIVE DIAGNOSIS: Same as pre op     DIAGNOSIS: A. PELVIC MASS, RIGHT; BIOPSY: - HIGH GRADE METASTATIC ADENOCARCINOMA, MORPHOLOGICALLY CONSISTENT WITH PATIENT'S KNOWN HISTORY OF ENDOMETRIAL CARCINOMA.  B. PELVIC MASS, RIGHT; CT-GUIDED BIOPSY: - HIGH GRADE METASTATIC ADENOCARCINOMA, MORPHOLOGICALLY CONSISTENT WITH PATIENT'S KNOWN HISTORY OF ENDOMETRIAL CARCINOMA.  Comment: The patient has a history of poorly differentiated endometrial adenocarcinoma (FIGO                          3)  diagnosed in June 2011. A limited panel of immunohistochemical stains was performed to sub classify the type of endometrial carcinoma. Stain results: p53: positive (focal strong subset in a background of wild type pattern)  ER: positive (diffuse) p 16: positive (focal strong subset) Napsin: negative Stain controls worked appropriately. This pattern of immunoreactivity is consistent with high grade endometrial adenocarcinoma, endometrioid type with focal aberrant expression of p53 and p16.  The staining pattern is not consistent with serous carcinoma. Preliminary findings were communicated to Dr. Rogue Bussing on 02/22/2016.  GROSS DESCRIPTION:  A. Labeled: right pelvic mass  Tissue fragment(s): 2  Size: cores of pink-tan tissue, and aggregate 2.0 x 0.1 x 0.1 cm  Description: cores received in saline, touch prep prepared with Diff Quik stain slide, following staining instructed by Dr. Reuel Derby entirely formalin fixed, wrapped in lens paper, marked blue and submit                         ted in a mesh bag  Entirely submitted in one cassette(s).   B. Labeled: right pelvic mass  Tissue fragment(s): 3  Size: cores of pink-tan tissue 0.3-2 cm in length and in diameter 0.1 cm  Description: pink-tan course, wrapped in lens paper, marked blue and submitted in a mesh bag  Entirely submitted in 1 cassette(s).     Final Diagnosis performed by Quay Burow, MD.  Electronically  signed 02/24/2016 1:43:43PM   The electronic signature indicates that the named Attending Pathologist has evaluated the specimen  Technical component performed at [facility], [address] [place], [place] [address] Lab: [phone] Dir: Darrick Penna. Evette Doffing, MD  Professional component performed at [facility], [facility], [place], [place], [place] [address] Lab: [phone] Dir: Dellia Nims. Reuel Derby, MD   ADDENDUM: Immunohistochemistry (IHC) Testing for Mismatch Repair (MMR) Proteins:  Results:  MLH1:  Intact nuclear expression MSH2: Intact nuclear exp                         ression MSH6: Intact nuclear expression PMS2: Intact nuclear expression  IHC Interpretation: No loss of nuclear expression of MMR proteins: Low probability of MSI-H.  Testing was performed on block: B1  IHC slides were prepared by [facility], [place], [place], and interpreted by Dr. Dicie Beam. All controls stained appropriately.    Addendum #1 performed by Bryan Lemma, MD.  Electronically signed 02/29/2016 5:34:54PM    Technical component performed at [facility], [address], [place], [place] [address] Lab: [phone] Dir: Darrick Penna. Evette Doffing, MD  Professional component performed at [facility], [facility], [place], [place],  [address] Lab: [phone] Dir: Dellia Nims. Reuel Derby, MD      Assessment:  [name] is a 70 y.o. female with a history of stage III endometrial carcinoma stats post TAH/BSO in 02/2010.  Pathology revealed grade III endometrial carcinoma.  Tumor was 3 x 3 cm and extended through the thickness of the myometrium.  There were 2 of 6 right pelvic lymph nodes and 2 of 6 periaortic nodes involved with metastatic disease. Peritoneal washings were negative.  Pathologic stage was T3N2M0 (stage III c2).  She received 6 cycles of carboplatin and Taxol from 03/27/2010 until 08/23/2010.  She received  brachytherapy (completed 11/2010).  She developed a grade I/II neuropathy.  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 Following re-anastomosis, she developed a leak and underwent diverting colostomy.  She has had 3 incisional hernias.  PET scan on 02/16/2016 revealed a 4.4 x 5.0 cm hypermetabolic soft tissue mass along the right pelvic sidewall, presumably malignant metastatic lymphadenopathy.  There was also metastatic lymphadenopathy in the left para-aortic nodal station adjacent to the left renal hilum, and in the right retrocrural nodal station.  There was a metastatic lesion in the right ilium.  There was evidence of persistent partial small bowel obstruction.  CA125 results are as follows: 19 on 01/19/2014, 23.6 on 07/23/2014, 40.5 on 01/26/2015, 102.5 on 02/08/2016, 82.7 on 03/07/2016, and 77.9 on 04/02/2016.  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  MSI testing is pending.   She is s/p 1 cycle of carboplatin and Taxol (03/07/2016).  Course was complicated by 2 admissions to [facility] (03/18/2016 and 03/25/2016) with acute renal failure secondary to dehydration from diarrhea.  She was treated with IVF and anti-emetics.  Her diarrhea improved with sandostatin 100 mcg SQ q 8 hours.  Stool was negative for C diff.  Sodium bicarbonate was instituted for a low bicarbonate secondary to her diarrhea.   She has chronic renal insufficiency.  CrCl is 32.6 ml/min.  Symptomatically,  she is fatigued.  Stool consistency has improved on octreotide.  She has new lower extremity edema.  She has hypomagnesemia (0.9)  Plan: 1.  Labs today:  CBC with diff, CMP, Mg, CA125. 2.  Discuss patient's thoughts about treatment.  She wishes to proceed with chemotherapy this week. 3.  Discuss new lower extremity edema.  Discuss plan to r/o DVT.  Echocardiogram to assess EF.  Last echo was in 05/2014 (EF was  55-60%).  Etiology may be secondary to low albumen. 4.  Bilateral lower extremity duplex- r/o DVT. 5.  Echocardiogram: assess EF. 6.  Follow-up with Dr. Allen Norris today. 7.  Magnesium 2  gm IV today. 8.  Sandostatin LAR 10 mg IM this week (approval pending). 9.  Continue short acting octreotide q 8 hours prn until long acting octreotide started. 10.  RTC tomorrow in [place] for labs (Mg) +/- additional IV magnesium. 11.  RTC on 04/05/2016 for labs (CBC with diff, CMP, Mg) and cycle #2 carboplatin and Taxol. 12.  RTC in 1 week for MD assess and labs (CBC with diff, BMP, Mg).  Addendum:  Bilateral lower extremity duplex on 04/02/2016 was negative.   Lequita Asal, MD  04/02/2016, 11:18 AM

## 2016-04-02 ENCOUNTER — Ambulatory Visit (INDEPENDENT_AMBULATORY_CARE_PROVIDER_SITE_OTHER): Payer: Medicare Other | Admitting: Gastroenterology

## 2016-04-02 ENCOUNTER — Inpatient Hospital Stay: Payer: Medicare Other

## 2016-04-02 ENCOUNTER — Other Ambulatory Visit: Payer: Self-pay | Admitting: *Deleted

## 2016-04-02 ENCOUNTER — Ambulatory Visit
Admission: RE | Admit: 2016-04-02 | Discharge: 2016-04-02 | Disposition: A | Discharge status: Home / Self Care | Payer: Medicare Other | Source: Ambulatory Visit | Attending: Hematology and Oncology | Admitting: Hematology and Oncology

## 2016-04-02 ENCOUNTER — Encounter: Payer: Self-pay | Admitting: Gastroenterology

## 2016-04-02 ENCOUNTER — Inpatient Hospital Stay (HOSPITAL_BASED_OUTPATIENT_CLINIC_OR_DEPARTMENT_OTHER): Payer: Medicare Other | Admitting: Hematology and Oncology

## 2016-04-02 ENCOUNTER — Other Ambulatory Visit: Payer: Self-pay

## 2016-04-02 VITALS — BP 109/66 | HR 93 | Temp 97.4°F | Ht 62.0 in | Wt 166.0 lb

## 2016-04-02 VITALS — BP 109/69 | HR 93 | Temp 97.4°F | Wt 166.4 lb

## 2016-04-02 DIAGNOSIS — F17210 Nicotine dependence, cigarettes, uncomplicated: Secondary | ICD-10-CM | POA: Diagnosis not present

## 2016-04-02 DIAGNOSIS — K6389 Other specified diseases of intestine: Secondary | ICD-10-CM | POA: Diagnosis not present

## 2016-04-02 DIAGNOSIS — Z5111 Encounter for antineoplastic chemotherapy: Secondary | ICD-10-CM | POA: Diagnosis not present

## 2016-04-02 DIAGNOSIS — R197 Diarrhea, unspecified: Secondary | ICD-10-CM | POA: Diagnosis not present

## 2016-04-02 DIAGNOSIS — C779 Secondary and unspecified malignant neoplasm of lymph node, unspecified: Secondary | ICD-10-CM

## 2016-04-02 DIAGNOSIS — Z8679 Personal history of other diseases of the circulatory system: Secondary | ICD-10-CM

## 2016-04-02 DIAGNOSIS — K432 Incisional hernia without obstruction or gangrene: Secondary | ICD-10-CM | POA: Diagnosis not present

## 2016-04-02 DIAGNOSIS — C541 Malignant neoplasm of endometrium: Secondary | ICD-10-CM

## 2016-04-02 DIAGNOSIS — K5660 Unspecified intestinal obstruction: Secondary | ICD-10-CM

## 2016-04-02 DIAGNOSIS — E669 Obesity, unspecified: Secondary | ICD-10-CM | POA: Diagnosis not present

## 2016-04-02 DIAGNOSIS — Z79899 Other long term (current) drug therapy: Secondary | ICD-10-CM

## 2016-04-02 DIAGNOSIS — R600 Localized edema: Secondary | ICD-10-CM | POA: Diagnosis present

## 2016-04-02 DIAGNOSIS — N189 Chronic kidney disease, unspecified: Secondary | ICD-10-CM | POA: Diagnosis not present

## 2016-04-02 DIAGNOSIS — N179 Acute kidney failure, unspecified: Secondary | ICD-10-CM

## 2016-04-02 DIAGNOSIS — Z87442 Personal history of urinary calculi: Secondary | ICD-10-CM | POA: Diagnosis not present

## 2016-04-02 DIAGNOSIS — K9403 Colostomy malfunction: Secondary | ICD-10-CM

## 2016-04-02 LAB — CBC WITH DIFFERENTIAL/PLATELET
Basophils Absolute: 0.1 10*3/uL (ref 0–0.1)
Basophils Relative: 1 %
Eosinophils Absolute: 0.1 10*3/uL (ref 0–0.7)
Eosinophils Relative: 1 %
HCT: 31.3 % — ABNORMAL LOW (ref 35.0–47.0)
Hemoglobin: 10.4 g/dL — ABNORMAL LOW (ref 12.0–16.0)
Lymphocytes Relative: 15 %
Lymphs Abs: 1.3 10*3/uL (ref 1.0–3.6)
MCH: 32.4 pg (ref 26.0–34.0)
MCHC: 33.1 g/dL (ref 32.0–36.0)
MCV: 97.7 fL (ref 80.0–100.0)
Monocytes Absolute: 0.9 10*3/uL (ref 0.2–0.9)
Monocytes Relative: 10 %
Neutro Abs: 6.8 10*3/uL — ABNORMAL HIGH (ref 1.4–6.5)
Neutrophils Relative %: 73 %
Platelets: 267 10*3/uL (ref 150–440)
RBC: 3.2 MIL/uL — ABNORMAL LOW (ref 3.80–5.20)
RDW: 15 % — ABNORMAL HIGH (ref 11.5–14.5)
WBC: 9.2 10*3/uL (ref 3.6–11.0)

## 2016-04-02 LAB — COMPREHENSIVE METABOLIC PANEL
ALT: 35 U/L (ref 14–54)
AST: 38 U/L (ref 15–41)
Albumin: 2.5 g/dL — ABNORMAL LOW (ref 3.5–5.0)
Alkaline Phosphatase: 178 U/L — ABNORMAL HIGH (ref 38–126)
Anion gap: 6 (ref 5–15)
BUN: 20 mg/dL (ref 6–20)
CO2: 15 mmol/L — ABNORMAL LOW (ref 22–32)
Calcium: 7.3 mg/dL — ABNORMAL LOW (ref 8.9–10.3)
Chloride: 117 mmol/L — ABNORMAL HIGH (ref 101–111)
Creatinine, Ser: 1.27 mg/dL — ABNORMAL HIGH (ref 0.44–1.00)
GFR calc Af Amer: 49 mL/min — ABNORMAL LOW (ref >60)
GFR calc non Af Amer: 42 mL/min — ABNORMAL LOW (ref >60)
Glucose, Bld: 112 mg/dL — ABNORMAL HIGH (ref 65–99)
Potassium: 4.6 mmol/L (ref 3.5–5.1)
Sodium: 138 mmol/L (ref 135–145)
Total Bilirubin: 0.5 mg/dL (ref 0.3–1.2)
Total Protein: 5.5 g/dL — ABNORMAL LOW (ref 6.5–8.1)

## 2016-04-02 LAB — MAGNESIUM: Magnesium: 0.9 mg/dL — CL (ref 1.7–2.4)

## 2016-04-02 MED ORDER — SODIUM CHLORIDE 0.9% FLUSH
10.0000 mL | Freq: Once | INTRAVENOUS | Status: AC
  Administered 2016-04-02: 10 mL via INTRAVENOUS
  Filled 2016-04-02: qty 10

## 2016-04-02 MED ORDER — MAGNESIUM SULFATE 2 GM/50ML IV SOLN
2.0000 g | Freq: Once | INTRAVENOUS | Status: AC
  Administered 2016-04-02: 2 g via INTRAVENOUS

## 2016-04-02 MED ORDER — HEPARIN SOD (PORK) LOCK FLUSH 100 UNIT/ML IV SOLN
500.0000 [IU] | Freq: Once | INTRAVENOUS | Status: AC
  Administered 2016-04-02: 500 [IU] via INTRAVENOUS

## 2016-04-02 NOTE — Progress Notes (Signed)
Patient is here today for follow up, was recently in the hospital, does have swelling in her legs. She feels very weak no energy.

## 2016-04-02 NOTE — Progress Notes (Signed)
Primary Care Physician: [name], MD  Primary Gastroenterologist:  Dr. [name]  Chief Complaint  Patient presents with  . Diarrhea    HPI: [name] is a 70 y.o. female here for follow-up after being seen in the hospital for diarrhea. The patient reports that her diarrhea is much better while taking octreotide.  The patient has stopped all of her other antidiarrheal medication.  There is no report of any weight loss black stools or bloody stools.  The patient reports her stools coming from her colostomy bag being a putty consistency.  Current Outpatient Prescriptions  Medication Sig Dispense Refill  . ALPRAZolam (XANAX) 0.25 MG tablet Take 0.25 mg by mouth at bedtime as needed for anxiety.     Marland Kitchen HYDROcodone-acetaminophen (NORCO/VICODIN) 5-325 MG tablet     . KLOR-CON M20 20 MEQ tablet     . octreotide (SANDOSTATIN) 100 MCG/ML SOLN injection Inject 1 mL (100 mcg total) into the skin every 8 (eight) hours. 13 mL 0  . ondansetron (ZOFRAN) 8 MG tablet     . potassium chloride 20 MEQ TBCR Take 20 mEq by mouth 2 (two) times daily. 60 tablet 0  . sertraline (ZOLOFT) 50 MG tablet     . sodium bicarbonate 650 MG tablet Take 1 tablet (650 mg total) by mouth 2 (two) times daily. 60 tablet 6  . Syringe, Disposable, 1 ML MISC 1 Units by Does not apply route 3 (three) times daily. 25 each 0   No current facility-administered medications for this visit.     Allergies as of 04/02/2016  . (No Known Allergies)    ROS:  General: Negative for anorexia, weight loss, fever, chills, fatigue, weakness. ENT: Negative for hoarseness, difficulty swallowing , nasal congestion. CV: Negative for chest pain, angina, palpitations, dyspnea on exertion, peripheral edema.  Respiratory: Negative for dyspnea at rest, dyspnea on exertion, cough, sputum, wheezing.  GI: See history of present illness. GU:  Negative for dysuria, hematuria, urinary incontinence, urinary frequency, nocturnal  urination.  Endo: Negative for unusual weight change.    Physical Examination:   BP 109/66 (BP Location: Left Arm, Patient Position: Sitting)   Pulse 93   Temp 97.4 F (36.3 C)   Ht 5\' 2"  (1.575 m)   Wt 166 lb (75.3 kg)   BMI 30.36 kg/m   General: Well-nourished, well-developed in no acute distress.  Eyes: No icterus. Conjunctivae pink. Mouth: Oropharyngeal mucosa moist and pink , no lesions erythema or exudate. Extremities: No lower extremity edema. No clubbing or deformities. Neuro: Alert and oriented x 3.  Grossly intact. Skin: Warm and dry, no jaundice.   Psych: Alert and cooperative, normal mood and affect.  Labs:    Imaging Studies: Ct Chest Wo Contrast  Result Date: 03/27/2016 CLINICAL DATA:  70 year old female with hypoxia and weakness. History of endometrial cancer. EXAM: CT CHEST WITHOUT CONTRAST TECHNIQUE: Multidetector CT imaging of the chest was performed following the standard protocol without IV contrast. COMPARISON:  02/16/2016 PET-CT and prior CTs FINDINGS: Cardiovascular: Coronary artery and thoracic aortic atherosclerotic calcifications again noted. There is no evidence of thoracic aortic aneurysm. Mediastinum/Nodes: There is no evidence of mediastinal mass, abnormal appearing mediastinal or hilar lymph nodes or pericardial effusion. A right Port-A-Cath is identified with tip in the lower SVC. Lungs/Pleura: Trace bilateral pleural effusions are noted. Right subsegmental anterior mid lung atelectasis/ scarring again noted. Mild bibasilar atelectasis is present. There is no evidence of airspace disease, consolidation or pneumothorax. A 4 mm left apical  nodule is unchanged. Upper Abdomen: A right retrocrural lymph node has decreased in size, now 0.9 cm (image 52), previously 1.2 cm. Hepatic steatosis and multiple renal cysts again noted. Musculoskeletal: No acute or suspicious abnormalities identified. Mild superior endplate compression of L1 again noted. IMPRESSION: Trace  bilateral pleural effusions with right subsegmental mid lung atelectasis/scarring and mild bibasilar atelectasis. Decreasing size of right retrocrural lymph node. Coronary artery disease, thoracic aortic atherosclerosis and hepatic steatosis again noted. Electronically Signed   By: Margarette Canada M.D.   On: 03/27/2016 12:11  US Renal  Result Date: 03/26/2016 CLINICAL DATA:  Acute renal failure. EXAM: RENAL / URINARY TRACT ULTRASOUND COMPLETE COMPARISON:  PET-CT 02/16/2016.  CT abdomen 02/06/2016. FINDINGS: Right Kidney: Length: 11.5 cm. Echogenicity within normal limits. Multiple renal cysts are noted some of which are thinly septated. No definite solid lesion identified. Polycystic kidney disease could present in this fashion. No hydronephrosis. Left Kidney: Length: 9.8 cm. Echogenicity within normal limits. Multiple renal cysts are noted some of which are thinly septated. Polycystic kidney disease could present this fashion. No hydronephrosis . Bladder: Appears normal for degree of bladder distention. IMPRESSION: 1. Multiple bilateral renal cysts some of which are thinly septated. No definite solid lesion identified. Polycystic kidney disease could present this fashion. No hydronephrosis or acute abnormality. 2. No evidence of bladder distention. Electronically Signed   By: Marcello Moores  Register   On: 03/26/2016 10:38  Nm Pulmonary Perf And Vent  Result Date: 03/26/2016 CLINICAL DATA:  Hypoxia and weakness EXAM: NUCLEAR MEDICINE VENTILATION - PERFUSION LUNG SCAN TECHNIQUE: Ventilation images were obtained in multiple projections using inhaled aerosol Tc-47m DTPA. Perfusion images were obtained in multiple projections after intravenous injection of Tc-16m MAA. RADIOPHARMACEUTICALS:  32.53 mCi Technetium-67m DTPA aerosol inhalation and 4.368 mCi Technetium-71m MAA IV COMPARISON:  Chest x-ray from earlier in the same day FINDINGS: Ventilation: Ventilation images demonstrate some central clumping although no  definitive ventilation defect is seen. Perfusion: No wedge shaped peripheral perfusion defects to suggest acute pulmonary embolism. IMPRESSION: No ventilation-perfusion mismatch to suggest pulmonary embolism is noted. Electronically Signed   By: Inez Catalina M.D.   On: 03/26/2016 16:37  US Venous Img Lower Bilateral  Result Date: 04/02/2016 CLINICAL DATA:  Bilateral lower extremity edema. History of endometrial cancer. History of smoking. Evaluate for DVT. EXAM: BILATERAL LOWER EXTREMITY VENOUS DOPPLER ULTRASOUND TECHNIQUE: Gray-scale sonography with graded compression, as well as color Doppler and duplex ultrasound were performed to evaluate the lower extremity deep venous systems from the level of the common femoral vein and including the common femoral, femoral, profunda femoral, popliteal and calf veins including the posterior tibial, peroneal and gastrocnemius veins when visible. The superficial great saphenous vein was also interrogated. Spectral Doppler was utilized to evaluate flow at rest and with distal augmentation maneuvers in the common femoral, femoral and popliteal veins. COMPARISON:  Left lower extremity venous Doppler ultrasound -03/26/2026 FINDINGS: RIGHT LOWER EXTREMITY Common Femoral Vein: No evidence of thrombus. Normal compressibility, respiratory phasicity and response to augmentation. Saphenofemoral Junction: No evidence of thrombus. Normal compressibility and flow on color Doppler imaging. Profunda Femoral Vein: No evidence of thrombus. Normal compressibility and flow on color Doppler imaging. Femoral Vein: No evidence of thrombus. Normal compressibility, respiratory phasicity and response to augmentation. Popliteal Vein: No evidence of thrombus. Normal compressibility, respiratory phasicity and response to augmentation. Calf Veins: No evidence of thrombus. Normal compressibility and flow on color Doppler imaging. Superficial Great Saphenous Vein: No evidence of thrombus. Normal  compressibility and flow on  color Doppler imaging. Venous Reflux:  None. Other Findings: Note is made of a approximately 3.2 x 1.0 x 2.1 cm serpiginous largely anechoic though minimally complex fluid collection within the popliteal fossa which is favored to represent a minimally complex Baker cyst. LEFT LOWER EXTREMITY Common Femoral Vein: No evidence of thrombus. Normal compressibility, respiratory phasicity and response to augmentation. Saphenofemoral Junction: No evidence of thrombus. Normal compressibility and flow on color Doppler imaging. Profunda Femoral Vein: No evidence of thrombus. Normal compressibility and flow on color Doppler imaging. Femoral Vein: No evidence of thrombus. Normal compressibility, respiratory phasicity and response to augmentation. Popliteal Vein: No evidence of thrombus. Normal compressibility, respiratory phasicity and response to augmentation. Calf Veins: No evidence of thrombus. Normal compressibility and flow on color Doppler imaging. Superficial Great Saphenous Vein: No evidence of thrombus. Normal compressibility and flow on color Doppler imaging. Venous Reflux:  None. Other Findings:  None. IMPRESSION: 1. No evidence of DVT within either lower extremity. 2. Incidental note made of an approximately 3.2 cm minimally complex right-sided Baker cyst. Electronically Signed   By: Sandi Mariscal M.D.   On: 04/02/2016 16:13   US Venous Img Lower Unilateral Left  Result Date: 03/26/2016 CLINICAL DATA:  70 year old female with a history of calf pain EXAM: LEFT LOWER EXTREMITY VENOUS DOPPLER ULTRASOUND TECHNIQUE: Gray-scale sonography with graded compression, as well as color Doppler and duplex ultrasound were performed to evaluate the lower extremity deep venous systems from the level of the common femoral vein and including the common femoral, femoral, profunda femoral, popliteal and calf veins including the posterior tibial, peroneal and gastrocnemius veins when visible. The superficial  great saphenous vein was also interrogated. Spectral Doppler was utilized to evaluate flow at rest and with distal augmentation maneuvers in the common femoral, femoral and popliteal veins. COMPARISON:  None. FINDINGS: Contralateral Common Femoral Vein: Respiratory phasicity is normal and symmetric with the symptomatic side. No evidence of thrombus. Normal compressibility. Common Femoral Vein: No evidence of thrombus. Normal compressibility, respiratory phasicity and response to augmentation. Saphenofemoral Junction: No evidence of thrombus. Normal compressibility and flow on color Doppler imaging. Profunda Femoral Vein: No evidence of thrombus. Normal compressibility and flow on color Doppler imaging. Femoral Vein: No evidence of thrombus. Normal compressibility, respiratory phasicity and response to augmentation. Popliteal Vein: No evidence of thrombus. Normal compressibility, respiratory phasicity and response to augmentation. Calf Veins: No evidence of thrombus. Normal compressibility and flow on color Doppler imaging. Superficial Great Saphenous Vein: No evidence of thrombus. Normal compressibility and flow on color Doppler imaging. Other Findings:  None. IMPRESSION: Sonographic survey of the left lower extremity negative for DVT. Signed, Dulcy Fanny. Earleen Newport, DO [facility] Electronically Signed   By: Corrie Mckusick D.O.   On: 03/26/2016 14:11  Dg Chest Port 1 View  Result Date: 03/26/2016 CLINICAL DATA:  Hypoxemia, tachycardia EXAM: PORTABLE CHEST 1 VIEW COMPARISON:  03/18/2016 FINDINGS: Right-sided Port-A-Cath in satisfactory position. There is no focal consolidation. There is mild right lower lobe atelectasis. There is no pleural effusion or pneumothorax. The heart and mediastinal contours are unremarkable. 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 IMPRESSION: No active disease. Electronically Signed   By: Kathreen Devoid   On: 03/26/2016  09:11  Dg Chest Port 1 View  Result Date: 03/18/2016 CLINICAL DATA:  Weakness, nausea and vomiting. EXAM: PORTABLE CHEST 1 VIEW COMPARISON:  03/01/2016 FINDINGS: There is a right chest wall port a catheter noted with tip in the projection of the SVC  peer heart size is normal. No pleural effusion or edema identified. Subsegmental atelectasis noted within the right midlung. No airspace consolidation identified. IMPRESSION: 1. Right midlung subsegmental atelectasis. Electronically Signed   By: Kerby Moors M.D.   On: 03/18/2016 11:22    Assessment and Plan:   [name] is a 70 y.o. y/o female who was discharged from the hospital after being admitted for diarrhea. The diarrhea is much better at this time.  She reports that the octreotide is what is keeping the diarrhea at [facility].  The patient will be set up for a colonoscopy.  She will also have her blood sent off for folate for possible bacterial overgrowth which has been found to be high in these patients.  The patient has been explained the plan and agrees with it.   Note: This dictation was prepared with Dragon dictation along with smaller phrase technology. Any transcriptional errors that result from this process are unintentional.

## 2016-04-03 ENCOUNTER — Other Ambulatory Visit: Payer: Self-pay | Admitting: Hematology and Oncology

## 2016-04-03 ENCOUNTER — Inpatient Hospital Stay: Payer: Medicare Other

## 2016-04-03 ENCOUNTER — Telehealth: Payer: Self-pay | Admitting: *Deleted

## 2016-04-03 DIAGNOSIS — Z5111 Encounter for antineoplastic chemotherapy: Secondary | ICD-10-CM | POA: Diagnosis present

## 2016-04-03 DIAGNOSIS — E876 Hypokalemia: Secondary | ICD-10-CM | POA: Insufficient documentation

## 2016-04-03 DIAGNOSIS — K432 Incisional hernia without obstruction or gangrene: Secondary | ICD-10-CM | POA: Insufficient documentation

## 2016-04-03 DIAGNOSIS — N189 Chronic kidney disease, unspecified: Secondary | ICD-10-CM | POA: Insufficient documentation

## 2016-04-03 DIAGNOSIS — Z79899 Other long term (current) drug therapy: Secondary | ICD-10-CM | POA: Diagnosis not present

## 2016-04-03 DIAGNOSIS — Z933 Colostomy status: Secondary | ICD-10-CM | POA: Insufficient documentation

## 2016-04-03 DIAGNOSIS — C541 Malignant neoplasm of endometrium: Secondary | ICD-10-CM | POA: Insufficient documentation

## 2016-04-03 DIAGNOSIS — K9403 Colostomy malfunction: Secondary | ICD-10-CM

## 2016-04-03 DIAGNOSIS — E860 Dehydration: Secondary | ICD-10-CM | POA: Insufficient documentation

## 2016-04-03 DIAGNOSIS — G629 Polyneuropathy, unspecified: Secondary | ICD-10-CM | POA: Insufficient documentation

## 2016-04-03 DIAGNOSIS — C779 Secondary and unspecified malignant neoplasm of lymph node, unspecified: Secondary | ICD-10-CM | POA: Insufficient documentation

## 2016-04-03 DIAGNOSIS — R601 Generalized edema: Secondary | ICD-10-CM | POA: Insufficient documentation

## 2016-04-03 DIAGNOSIS — F17210 Nicotine dependence, cigarettes, uncomplicated: Secondary | ICD-10-CM | POA: Insufficient documentation

## 2016-04-03 LAB — MAGNESIUM: Magnesium: 1.4 mg/dL — ABNORMAL LOW (ref 1.7–2.4)

## 2016-04-03 LAB — CA 125: CA 125: 77.9 U/mL — ABNORMAL HIGH (ref 0.0–38.1)

## 2016-04-03 MED ORDER — HEPARIN SOD (PORK) LOCK FLUSH 100 UNIT/ML IV SOLN
500.0000 [IU] | Freq: Once | INTRAVENOUS | Status: AC
  Administered 2016-04-03: 500 [IU] via INTRAVENOUS

## 2016-04-03 MED ORDER — MAGNESIUM SULFATE 50 % IJ SOLN
1.0000 g | Freq: Once | INTRAVENOUS | Status: AC
  Administered 2016-04-03: 1 g via INTRAVENOUS
  Filled 2016-04-03: qty 2

## 2016-04-03 MED ORDER — OCTREOTIDE ACETATE 20 MG IM KIT: 10.0000 mg | PACK | Freq: Once | INTRAMUSCULAR | Status: DC

## 2016-04-03 MED ORDER — SODIUM CHLORIDE 0.9 % IV SOLN: Freq: Once | INTRAVENOUS | Status: DC

## 2016-04-03 MED ORDER — MAGNESIUM SULFATE 2 GM/50ML IV SOLN: 2.0000 g | Freq: Once | INTRAVENOUS | Status: DC

## 2016-04-03 NOTE — Telephone Encounter (Signed)
Called pt this am to find out the last shot of sandostatin she had.  It was 3 am this morning.  Advised her that she does not need to take another one because she is now going to be on long acting injection that she will get this afternoon. She thanked me for the phone call and I did remind her that she is getting the mag. Level and poss IV mag today in [place] if she needs it

## 2016-04-04 ENCOUNTER — Other Ambulatory Visit: Payer: Medicare Other

## 2016-04-04 ENCOUNTER — Encounter: Payer: Self-pay | Admitting: Hematology and Oncology

## 2016-04-05 ENCOUNTER — Inpatient Hospital Stay: Payer: Medicare Other | Attending: Oncology | Admitting: Oncology

## 2016-04-05 ENCOUNTER — Other Ambulatory Visit: Payer: Self-pay | Admitting: Hematology and Oncology

## 2016-04-05 ENCOUNTER — Inpatient Hospital Stay: Payer: Medicare Other

## 2016-04-05 DIAGNOSIS — C541 Malignant neoplasm of endometrium: Secondary | ICD-10-CM

## 2016-04-05 DIAGNOSIS — Z79899 Other long term (current) drug therapy: Secondary | ICD-10-CM

## 2016-04-05 DIAGNOSIS — C779 Secondary and unspecified malignant neoplasm of lymph node, unspecified: Secondary | ICD-10-CM

## 2016-04-05 DIAGNOSIS — G629 Polyneuropathy, unspecified: Secondary | ICD-10-CM | POA: Diagnosis not present

## 2016-04-05 DIAGNOSIS — F17210 Nicotine dependence, cigarettes, uncomplicated: Secondary | ICD-10-CM

## 2016-04-05 DIAGNOSIS — Z5111 Encounter for antineoplastic chemotherapy: Secondary | ICD-10-CM | POA: Diagnosis not present

## 2016-04-05 DIAGNOSIS — Z933 Colostomy status: Secondary | ICD-10-CM

## 2016-04-05 DIAGNOSIS — N189 Chronic kidney disease, unspecified: Secondary | ICD-10-CM

## 2016-04-05 LAB — COMPREHENSIVE METABOLIC PANEL
ALT: 36 U/L (ref 14–54)
AST: 33 U/L (ref 15–41)
Albumin: 2.2 g/dL — ABNORMAL LOW (ref 3.5–5.0)
Alkaline Phosphatase: 177 U/L — ABNORMAL HIGH (ref 38–126)
Anion gap: 5 (ref 5–15)
BUN: 20 mg/dL (ref 6–20)
CO2: 17 mmol/L — ABNORMAL LOW (ref 22–32)
Calcium: 7.7 mg/dL — ABNORMAL LOW (ref 8.9–10.3)
Chloride: 116 mmol/L — ABNORMAL HIGH (ref 101–111)
Creatinine, Ser: 0.98 mg/dL (ref 0.44–1.00)
GFR calc Af Amer: >60 mL/min (ref >60)
GFR calc non Af Amer: 58 mL/min — ABNORMAL LOW (ref >60)
Glucose, Bld: 108 mg/dL — ABNORMAL HIGH (ref 65–99)
Potassium: 4.5 mmol/L (ref 3.5–5.1)
Sodium: 138 mmol/L (ref 135–145)
Total Bilirubin: 0.5 mg/dL (ref 0.3–1.2)
Total Protein: 5.4 g/dL — ABNORMAL LOW (ref 6.5–8.1)

## 2016-04-05 LAB — CBC WITH DIFFERENTIAL/PLATELET
Basophils Absolute: 0.1 10*3/uL (ref 0–0.1)
Basophils Relative: 1 %
Eosinophils Absolute: 0.1 10*3/uL (ref 0–0.7)
Eosinophils Relative: 2 %
HCT: 30.1 % — ABNORMAL LOW (ref 35.0–47.0)
Hemoglobin: 10.2 g/dL — ABNORMAL LOW (ref 12.0–16.0)
Lymphocytes Relative: 18 %
Lymphs Abs: 1.1 10*3/uL (ref 1.0–3.6)
MCH: 32.8 pg (ref 26.0–34.0)
MCHC: 33.8 g/dL (ref 32.0–36.0)
MCV: 97 fL (ref 80.0–100.0)
Monocytes Absolute: 1 10*3/uL — ABNORMAL HIGH (ref 0.2–0.9)
Monocytes Relative: 16 %
Neutro Abs: 4.1 10*3/uL (ref 1.4–6.5)
Neutrophils Relative %: 63 %
Platelets: 317 10*3/uL (ref 150–440)
RBC: 3.1 MIL/uL — ABNORMAL LOW (ref 3.80–5.20)
RDW: 15.9 % — ABNORMAL HIGH (ref 11.5–14.5)
WBC: 6.4 10*3/uL (ref 3.6–11.0)

## 2016-04-05 LAB — MAGNESIUM: Magnesium: 1.3 mg/dL — ABNORMAL LOW (ref 1.7–2.4)

## 2016-04-05 MED ORDER — OCTREOTIDE ACETATE 20 MG IM KIT
10.0000 mg | PACK | Freq: Once | INTRAMUSCULAR | Status: AC
  Administered 2016-04-05: 10 mg via INTRAMUSCULAR

## 2016-04-05 MED ORDER — MAGNESIUM SULFATE 4 GM/100ML IV SOLN
4.0000 g | Freq: Once | INTRAVENOUS | Status: AC
  Administered 2016-04-05: 4 g via INTRAVENOUS
  Filled 2016-04-05: qty 100

## 2016-04-05 MED ORDER — SODIUM CHLORIDE 0.9% FLUSH
10.0000 mL | INTRAVENOUS | Status: DC | PRN
  Administered 2016-04-05: 10 mL via INTRAVENOUS
  Filled 2016-04-05: qty 10

## 2016-04-05 MED ORDER — CARBOPLATIN CHEMO INJECTION 600 MG/60ML
438.0000 mg | Freq: Once | INTRAVENOUS | Status: AC
  Administered 2016-04-05: 440 mg via INTRAVENOUS
  Filled 2016-04-05: qty 44

## 2016-04-05 MED ORDER — HEPARIN SOD (PORK) LOCK FLUSH 100 UNIT/ML IV SOLN
500.0000 [IU] | Freq: Once | INTRAVENOUS | Status: AC
  Administered 2016-04-05: 500 [IU] via INTRAVENOUS
  Filled 2016-04-05: qty 5

## 2016-04-05 MED ORDER — DEXTROSE 5 % IV SOLN: 175.0000 mg/m2 | Freq: Once | INTRAVENOUS | Status: DC

## 2016-04-05 MED ORDER — SODIUM CHLORIDE 0.9 % IV SOLN
20.0000 mg | Freq: Once | INTRAVENOUS | Status: AC
  Administered 2016-04-05: 20 mg via INTRAVENOUS
  Filled 2016-04-05: qty 2

## 2016-04-05 MED ORDER — PALONOSETRON HCL INJECTION 0.25 MG/5ML
0.2500 mg | Freq: Once | INTRAVENOUS | Status: AC
  Administered 2016-04-05: 0.25 mg via INTRAVENOUS
  Filled 2016-04-05: qty 5

## 2016-04-05 MED ORDER — SODIUM CHLORIDE 0.9 % IV SOLN: 4.0000 g | Freq: Once | INTRAVENOUS | Status: DC

## 2016-04-05 MED ORDER — SODIUM CHLORIDE 0.9 % IV SOLN
Freq: Once | INTRAVENOUS | Status: AC
  Administered 2016-04-05: 10:00:00 via INTRAVENOUS
  Filled 2016-04-05: qty 1000

## 2016-04-05 MED ORDER — FAMOTIDINE IN NACL 20-0.9 MG/50ML-% IV SOLN
20.0000 mg | Freq: Once | INTRAVENOUS | Status: AC
  Administered 2016-04-05: 20 mg via INTRAVENOUS
  Filled 2016-04-05: qty 50

## 2016-04-05 MED ORDER — PACLITAXEL CHEMO INJECTION 300 MG/50ML
175.0000 mg/m2 | Freq: Once | INTRAVENOUS | Status: AC
Start: 2016-04-05 — End: 2016-04-05
  Administered 2016-04-05: 312 mg via INTRAVENOUS
  Filled 2016-04-05: qty 52

## 2016-04-05 MED ORDER — DIPHENHYDRAMINE HCL 50 MG/ML IJ SOLN
50.0000 mg | Freq: Once | INTRAMUSCULAR | Status: AC
Start: 2016-04-05 — End: 2016-04-05
  Administered 2016-04-05: 25 mg via INTRAVENOUS
  Filled 2016-04-05: qty 1

## 2016-04-05 NOTE — Progress Notes (Signed)
[facility] day:  04/05/16   Chief Complaint: [name] is a 70 y.o. female with recurrent stage III endometrial cancer who is seen for reassessment after interval hospitalizations.  HPI:  Patient returns to clinic today for further evaluation and consideration of cycle 2 of carboplatinum and Taxol. She continues to have increased weakness and fatigue, but feels improved and nearly back to her baseline. She does not complain of any further diarrhea today. She has no neurologic complaints. She denies any recent fevers. She has no chest pain or shortness of breath. She denies any nausea, vomiting, or constipation. She has no urinary complaints. Patient offers no specific complaints today.    Past Medical History:  Diagnosis Date  . Cancer [facility]) 2011   endometrial  . Chronic kidney disease    STONES  . Diverticulitis of colon (without mention of hemorrhage)   . History of kidney stones 2011  . Mechanical complication of colostomy and enterostomy   . Obesity, unspecified   . Personal history of tobacco use, presenting hazards to health     Past Surgical History:  Procedure Laterality Date  . ABDOMINAL HYSTERECTOMY  2011  . APPENDECTOMY    . CHOLECYSTECTOMY  05/07/14   Incidental during ventral hernia repair.   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 COLON SURGERY  05/20/1999   Drainage of pelvic abscess, end colostomy  . COLON SURGERY  11/15/2010   Revision of colostomy stoma  . COLONOSCOPY  June 2010,03/31/14   Tubular adenoma of the transverse colon, hyperplastic polyps of the sigmoid.  Marland Kitchen COLOSTOMY  2011  . DILATION AND CURETTAGE OF UTERUS    . HERNIA REPAIR  04/06/14   ventral hernia, tetro rectus Ventralex ST mesh, 20 x 33 cm.   Marland Kitchen HYSTEROSCOPY  2011  . LITHOTRIPSY  2009  . MOLE REMOVAL  2003  . PORTACATH PLACEMENT N/A 03/01/2016   Procedure:  INSERTION PORT-A-CATH;  Surgeon: [name], MD;  Location: [facility] ORS;  Service: General;  Laterality: N/A;  . PYELOLITHOTOMY    . skin cancer removal  02/2014  . TUBAL LIGATION      Family History  Problem Relation Age of Onset  . Breast cancer Maternal Grandmother   . Colon cancer Mother   . Cancer Mother     Social History:  reports that she has been smoking Cigarettes.  She has a 3.75 pack-year smoking history. She has never used smokeless tobacco. She reports that she does not drink alcohol or use drugs.  She smokes 7 cigarettes a day plus vapor cigarettes.  She doesn't want to quit smoking.  She is a retired Marine scientist.  The patient is accompaied by her husband,  [name], today.  Allergies: No Known Allergies  Current Medications: Current Outpatient Prescriptions  Medication Sig Dispense Refill  . ALPRAZolam (XANAX) 0.25 MG tablet Take 0.25 mg by mouth at bedtime as needed for anxiety.     Marland Kitchen HYDROcodone-acetaminophen (NORCO/VICODIN) 5-325 MG tablet     . KLOR-CON M20 20 MEQ tablet     . octreotide (SANDOSTATIN) 100 MCG/ML SOLN injection Inject 1 mL (100 mcg total) into the skin every 8 (eight) hours. 13 mL 0  . potassium chloride 20 MEQ TBCR Take 20 mEq by mouth 2 (two) times daily. 60 tablet 0  . sertraline (ZOLOFT) 50 MG tablet     . sodium bicarbonate  650 MG tablet Take 1 tablet (650 mg total) by mouth 2 (two) times daily. 60 tablet 6  . Syringe, Disposable, 1 ML MISC 1 Units by Does not apply route 3 (three) times daily. 25 each 0  . ondansetron (ZOFRAN) 8 MG tablet      No current facility-administered medications for this visit.    Facility-Administered Medications Ordered in Other Visits  Medication Dose Route Frequency Provider Last Rate Last Dose  . heparin lock flush 100 unit/mL  500 Units Intravenous Once Lequita Asal, MD      . sodium chloride flush (NS) 0.9 % injection 10 mL  10 mL Intravenous PRN Lequita Asal, MD   10 mL at 04/05/16 0845    Review  of Systems:  GENERAL:  Fatigue.  No fevers or sweats.  PERFORMANCE STATUS (ECOG):  2 HEENT:  No visual changes, runny nose, sore throat, mouth sores or tenderness. Lungs: No shortness of breath or cough.  No hemoptysis. Cardiac:  No chest pain, palpitations, orthopnea, or PND. GI:  No nausea, vomiting, constipation, melena or hematochezia. GU:  No urgency, frequency, dysuria, or hematuria. Musculoskeletal:  No back pain.  No joint pain.  No muscle tenderness. Extremities:  No pain or swelling. Skin:  No rashes or skin changes. Neuro:  No headache, numbness or weakness, balance or coordination issues. Endocrine:  No diabetes, thyroid issues, hot flashes or night sweats. Psych:  No mood changes, depression or anxiety. Pain:  No pain.  Review of systems:  All other systems reviewed and found to be negative.  Physical Exam: Blood pressure 119/84, pulse 83, temperature (!) 95 F (35 C), temperature source Tympanic, resp. rate 17, height 5' 2"  (1.575 m), weight 164 lb 7.4 oz (74.6 kg). GENERAL:  Elderly woman sitting comfortably in a wheelchair in the exam room in no acute distress. MENTAL STATUS:  Alert and oriented to person, place and time. HEAD:  Short gray hair.  Normocephalic, atraumatic, face symmetric, no Cushingoid features. EYES:  Blue eyes.  Pupils equal round and reactive to light and accomodation.  No conjunctivitis or scleral icterus. ENT:  Oropharynx clear without lesion.  Tongue normal. Mucous membranes moist.  RESPIRATORY:  Clear to auscultation without rales, wheezes or rhonchi. CARDIOVASCULAR:  Regular rate and rhythm without murmur, rub or gallop. ABDOMEN:  Colostomy.  Soft, non-tender, with active bowel sounds, and no hepatosplenomegaly.  No masses. SKIN:  No rashes, ulcers or lesions. EXTREMITIES: Bilateral lower extremity edema.  No skin discoloration or tenderness.  No palpable cords. LYMPH NODES: No palpable cervical, supraclavicular, axillary or inguinal adenopathy   NEUROLOGICAL: Unremarkable. PSYCH:  Appropriate.  No visits with results within 3 Day(s) from this visit. Latest known visit with results is:  Hospital Outpatient Visit on 02/21/2016  Component Date Value Ref Range Status  . aPTT 02/21/2016 34  24 - 36 seconds Final  . Prothrombin Time 02/21/2016 14.1  11.4 - 15.0 seconds Final  . INR 02/21/2016 1.07   Final  . SURGICAL PATHOLOGY 02/21/2016    Final-Edited                   Value:Surgical Pathology THIS IS AN ADDENDUM REPORT CASE: [id number] PATIENT: [facility] Surgical Pathology Report Addendum  Reason for Addendum #1:  Immunohistochemistry results  SPECIMEN SUBMITTED: A. Pelvic mass, right B. Pelvic mass, right  CLINICAL HISTORY: T3N2M0 (stage III c2) endometrial carcinoma, grade 3 in 2011  PRE-OPERATIVE DIAGNOSIS: Probable recurrent endometrial carcinoma involving 4-5 cm right pelvic sidewall  mass and high left aortic node  POST-OPERATIVE DIAGNOSIS: Same as pre op     DIAGNOSIS: A. PELVIC MASS, RIGHT; BIOPSY: - HIGH GRADE METASTATIC ADENOCARCINOMA, MORPHOLOGICALLY CONSISTENT WITH PATIENT'S KNOWN HISTORY OF ENDOMETRIAL CARCINOMA.  B. PELVIC MASS, RIGHT; CT-GUIDED BIOPSY: - HIGH GRADE METASTATIC ADENOCARCINOMA, MORPHOLOGICALLY CONSISTENT WITH PATIENT'S KNOWN HISTORY OF ENDOMETRIAL CARCINOMA.  Comment: The patient has a history of poorly differentiated endometrial adenocarcinoma (FIGO                          3)  diagnosed in June 2011. A limited panel of immunohistochemical stains was performed to sub classify the type of endometrial carcinoma. Stain results: p53: positive (focal strong subset in a background of wild type pattern)  ER: positive (diffuse) p 16: positive (focal strong subset) Napsin: negative Stain controls worked appropriately. This pattern of immunoreactivity is consistent with high grade endometrial adenocarcinoma, endometrioid type with focal aberrant expression of p53 and  p16.  The staining pattern is not consistent with serous carcinoma. Preliminary findings were communicated to Dr. Rogue Bussing on 02/22/2016.  GROSS DESCRIPTION:  A. Labeled: right pelvic mass  Tissue fragment(s): 2  Size: cores of pink-tan tissue, and aggregate 2.0 x 0.1 x 0.1 cm  Description: cores received in saline, touch prep prepared with Diff Quik stain slide, following staining instructed by Dr. Reuel Derby entirely formalin fixed, wrapped in lens paper, marked blue and submit                         ted in a mesh bag  Entirely submitted in one cassette(s).   B. Labeled: right pelvic mass  Tissue fragment(s): 3  Size: cores of pink-tan tissue 0.3-2 cm in length and in diameter 0.1 cm  Description: pink-tan course, wrapped in lens paper, marked blue and submitted in a mesh bag  Entirely submitted in 1 cassette(s).     Final Diagnosis performed by Quay Burow, MD.  Electronically signed 02/24/2016 1:43:43PM   The electronic signature indicates that the named Attending Pathologist has evaluated the specimen  Technical component performed at [facility], [address] [place], [place] [address] Lab: [phone] Dir: Darrick Penna. Evette Doffing, MD  Professional component performed at [facility], [facility], [place], [place], [place] [address] Lab: [phone] Dir: Dellia Nims. Reuel Derby, MD   ADDENDUM: Immunohistochemistry (IHC) Testing for Mismatch Repair (MMR) Proteins:  Results:  MLH1: Intact nuclear expression MSH2: Intact nuclear exp                         ression MSH6: Intact nuclear expression PMS2: Intact nuclear expression  IHC Interpretation: No loss of nuclear expression of MMR proteins: Low probability of MSI-H.  Testing was performed on block: B1  IHC slides were prepared by [facility], [place], [place], and interpreted by Dr. Dicie Beam. All controls stained appropriately.    Addendum #1 performed by  Bryan Lemma, MD.  Electronically signed 02/29/2016 5:34:54PM    Technical component performed at [facility], [address], [place], [place] [address] Lab: [phone] Dir: Darrick Penna. Evette Doffing, MD  Professional component performed at [facility], [facility], [place], [place], [place] [address] Lab: [phone] Dir: Dellia Nims. Reuel Derby, MD      Assessment:  [name] is a 70 y.o. female with a history of stage III endometrial carcinoma stats post TAH/BSO in 02/2010.  Pathology revealed grade III endometrial  carcinoma.  Tumor was 3 x 3 cm and extended through the thickness of the myometrium.  There were 2 of 6 right pelvic lymph nodes and 2 of 6 periaortic nodes involved with metastatic disease. Peritoneal washings were negative.  Pathologic stage was T3N2M0 (stage III c2).  She received 6 cycles of carboplatin and Taxol from 03/27/2010 until 08/23/2010.  She received brachytherapy (completed 11/2010).  She developed a grade I/II neuropathy.  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 Following re-anastomosis, she developed a leak and underwent diverting colostomy.  She has had 3 incisional hernias.  PET scan on 02/16/2016 revealed a 4.4 x 5.0 cm hypermetabolic soft tissue mass along the right pelvic sidewall, presumably malignant metastatic lymphadenopathy.  There was also metastatic lymphadenopathy in the left para-aortic nodal station adjacent to the left renal hilum, and in the right retrocrural nodal station.  There was a metastatic lesion in the right ilium.  There was evidence of persistent partial small bowel obstruction.  CA125 results are as follows: 19 on 01/19/2014, 23.6 on 07/23/2014, 40.5 on 01/26/2015, 102.5 on 02/08/2016, 82.7 on 03/07/2016, and 77.9 on 04/02/2016.  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  MSI testing is pending.    Patient receive cycle 1 of carboplatin and Taxol on March 07, 2016. Course was complicated by 2 admissions to [facility] (03/18/2016 and 03/25/2016) with acute renal failure secondary to dehydration from diarrhea.  She was treated with IVF and anti-emetics.  Her diarrhea improved with sandostatin 100 mcg SQ q 8 hours.  Stool was negative for C diff.  Sodium bicarbonate was instituted for a low bicarbonate secondary to her diarrhea.   She has chronic renal insufficiency.   Plan:  1. Endometrial cancer: Proceed with cycle 2 of carboplatinum and Taxol today. 2. Hypomagnesemia: Patient will receive 4 mg IV magnesium today. 3. Lower extremity edema: No evidence of DVT. 4. Diarrhea: Patient does not complain of this today. 5. Follow-up: Return to clinic on Monday as previously scheduled for laboratory work and further evaluation. Patient's next chemotherapy will be in 3 weeks.    Lloyd Huger, MD  04/05/2016, 9:32 AM

## 2016-04-05 NOTE — Progress Notes (Signed)
No changes since last visit per pt.

## 2016-04-06 ENCOUNTER — Ambulatory Visit
Admission: RE | Admit: 2016-04-06 | Discharge: 2016-04-06 | Disposition: A | Discharge status: Home / Self Care | Payer: Medicare Other | Source: Ambulatory Visit | Attending: Hematology and Oncology | Admitting: Hematology and Oncology

## 2016-04-06 ENCOUNTER — Inpatient Hospital Stay: Payer: Medicare Other | Admitting: Family Medicine

## 2016-04-06 DIAGNOSIS — Z87891 Personal history of nicotine dependence: Secondary | ICD-10-CM | POA: Insufficient documentation

## 2016-04-06 DIAGNOSIS — I4891 Unspecified atrial fibrillation: Secondary | ICD-10-CM | POA: Insufficient documentation

## 2016-04-06 DIAGNOSIS — I340 Nonrheumatic mitral (valve) insufficiency: Secondary | ICD-10-CM | POA: Diagnosis not present

## 2016-04-06 DIAGNOSIS — Z8679 Personal history of other diseases of the circulatory system: Secondary | ICD-10-CM | POA: Diagnosis not present

## 2016-04-06 DIAGNOSIS — N189 Chronic kidney disease, unspecified: Secondary | ICD-10-CM | POA: Insufficient documentation

## 2016-04-06 DIAGNOSIS — I129 Hypertensive chronic kidney disease with stage 1 through stage 4 chronic kidney disease, or unspecified chronic kidney disease: Secondary | ICD-10-CM | POA: Diagnosis not present

## 2016-04-06 NOTE — Progress Notes (Signed)
*  PRELIMINARY RESULTS* Echocardiogram 2D Echocardiogram has been performed.  Sherrie Sport 04/06/2016, 10:44 AM

## 2016-04-07 NOTE — Progress Notes (Deleted)
[facility] day:  04/07/16   Chief Complaint: [name] is a 70 y.o. female with recurrent stage III endometrial cancer who is seen for assessment on day 5 s/p cycle #2 carboplatin and Taxol.  HPI:  The patient was last seen by me in the medical oncology clinic on 04/02/2016.  At that time, she was fatigued.  Stool consistency had improved on octreotide.  She had new lower extremity edema.  She had hypomagnesemia (0.9).  Bilateral lower extremity duplex on 04/02/2016 was negative.  She was scheduled for an echo.  She was seen by Dr. Delight Hoh on 04/05/2016.  She received cycle #2 carboplatin and Taxol.  She received sandostatin LAR 10 mg.  She received 4 gm IV magnesium.  Echo on 04/06/2016 revealed an EF of 60-65%.   Past Medical History:  Diagnosis Date  . Cancer [facility]) 2011   endometrial  . Chronic kidney disease    STONES  . Diverticulitis of colon (without mention of hemorrhage)   . History of kidney stones 2011  . Mechanical complication of colostomy and enterostomy   . Obesity, unspecified   . Personal history of tobacco use, presenting hazards to health     Past Surgical History:  Procedure Laterality Date  . ABDOMINAL HYSTERECTOMY  2011  . APPENDECTOMY    . CHOLECYSTECTOMY  05/07/14   Incidental during ventral hernia repair.   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 COLON SURGERY  05/20/1999   Drainage of pelvic abscess, end colostomy  . COLON SURGERY  11/15/2010   Revision of colostomy stoma  . COLONOSCOPY  June 2010,03/31/14   Tubular adenoma of the transverse colon, hyperplastic polyps of the sigmoid.  Marland Kitchen COLOSTOMY  2011  . DILATION AND CURETTAGE OF UTERUS    . HERNIA REPAIR  04/06/14   ventral hernia, tetro rectus Ventralex ST mesh, 20 x 33 cm.   Marland Kitchen HYSTEROSCOPY  2011  . LITHOTRIPSY  2009  . MOLE REMOVAL  2003  . PORTACATH  PLACEMENT N/A 03/01/2016   Procedure: INSERTION PORT-A-CATH;  Surgeon: [name], MD;  Location: [facility] ORS;  Service: General;  Laterality: N/A;  . PYELOLITHOTOMY    . skin cancer removal  02/2014  . TUBAL LIGATION      Family History  Problem Relation Age of Onset  . Breast cancer Maternal Grandmother   . Colon cancer Mother   . Cancer Mother     Social History:  reports that she has been smoking Cigarettes.  She has a 3.75 pack-year smoking history. She has never used smokeless tobacco. She reports that she does not drink alcohol or use drugs.  She smokes 7 cigarettes a day plus vapor cigarettes.  She doesn't want to quit smoking.  She is a retired Marine scientist.  The patient is accompaied by her husband,  [name], today.  Allergies: No Known Allergies  Current Medications: Current Outpatient Prescriptions  Medication Sig Dispense Refill  . ALPRAZolam (XANAX) 0.25 MG tablet Take 0.25 mg by mouth at bedtime as needed for anxiety.     Marland Kitchen HYDROcodone-acetaminophen (NORCO/VICODIN) 5-325 MG tablet     . KLOR-CON M20 20 MEQ tablet     . octreotide (SANDOSTATIN) 100 MCG/ML SOLN injection Inject 1 mL (100 mcg total) into the skin every 8 (eight) hours. 13 mL 0  . ondansetron (ZOFRAN) 8 MG tablet     .  potassium chloride 20 MEQ TBCR Take 20 mEq by mouth 2 (two) times daily. 60 tablet 0  . sertraline (ZOLOFT) 50 MG tablet     . sodium bicarbonate 650 MG tablet Take 1 tablet (650 mg total) by mouth 2 (two) times daily. 60 tablet 6  . Syringe, Disposable, 1 ML MISC 1 Units by Does not apply route 3 (three) times daily. 25 each 0   No current facility-administered medications for this visit.     Review of Systems:  GENERAL:  Fatigue.  No fevers or sweats.  Weight up 6 pounds. PERFORMANCE STATUS (ECOG):  2 HEENT:  No visual changes, runny nose, sore throat, mouth sores or tenderness. Lungs: No shortness of breath or cough.  No hemoptysis. Cardiac:  No chest pain, palpitations, orthopnea, or  PND. GI:  Ostomy output with consistency of pudding (improved) on octreotide.  No nausea, vomiting, constipation, melena or hematochezia. GU:  No urgency, frequency, dysuria, or hematuria. Musculoskeletal:  No back pain.  No joint pain.  No muscle tenderness. Extremities:  No pain or swelling. Skin:  No rashes or skin changes. Neuro:  No headache, numbness or weakness, balance or coordination issues. Endocrine:  No diabetes, thyroid issues, hot flashes or night sweats. Psych:  No mood changes, depression or anxiety. Pain:  No pain. Review of systems:  All other systems reviewed and found to be negative.  Physical Exam: There were no vitals taken for this visit. GENERAL:  Elderly woman sitting comfortably in a wheelchair in the exam room in no acute distress. MENTAL STATUS:  Alert and oriented to person, place and time. HEAD:  Short gray hair.  Normocephalic, atraumatic, face symmetric, no Cushingoid features. EYES:  Blue eyes.  Pupils equal round and reactive to light and accomodation.  No conjunctivitis or scleral icterus. ENT:  Oropharynx clear without lesion.  Tongue normal. Mucous membranes moist.  RESPIRATORY:  Clear to auscultation without rales, wheezes or rhonchi. CARDIOVASCULAR:  Regular rate and rhythm without murmur, rub or gallop. ABDOMEN:  Colostomy.  Soft, non-tender, with active bowel sounds, and no hepatosplenomegaly.  No masses. SKIN:  No rashes, ulcers or lesions. EXTREMITIES: Bilateral lower extremity edema.  No skin discoloration or tenderness.  No palpable cords. LYMPH NODES: No palpable cervical, supraclavicular, axillary or inguinal adenopathy  NEUROLOGICAL: Unremarkable. PSYCH:  Appropriate.  No visits with results within 3 Day(s) from this visit. Latest known visit with results is:  Hospital Outpatient Visit on 02/21/2016  Component Date Value Ref Range Status  . aPTT 02/21/2016 34  24 - 36 seconds Final  . Prothrombin Time 02/21/2016 14.1  11.4 - 15.0  seconds Final  . INR 02/21/2016 1.07   Final  . SURGICAL PATHOLOGY 02/21/2016    Final-Edited                   Value:Surgical Pathology THIS IS AN ADDENDUM REPORT CASE: [id number] PATIENT: [facility] Surgical Pathology Report Addendum  Reason for Addendum #1:  Immunohistochemistry results  SPECIMEN SUBMITTED: A. Pelvic mass, right B. Pelvic mass, right  CLINICAL HISTORY: T3N2M0 (stage III c2) endometrial carcinoma, grade 3 in 2011  PRE-OPERATIVE DIAGNOSIS: Probable recurrent endometrial carcinoma involving 4-5 cm right pelvic sidewall mass and high left aortic node  POST-OPERATIVE DIAGNOSIS: Same as pre op     DIAGNOSIS: A. PELVIC MASS, RIGHT; BIOPSY: - HIGH GRADE METASTATIC ADENOCARCINOMA, MORPHOLOGICALLY CONSISTENT WITH PATIENT'S KNOWN HISTORY OF ENDOMETRIAL CARCINOMA.  B. PELVIC MASS, RIGHT; CT-GUIDED BIOPSY: - HIGH GRADE METASTATIC ADENOCARCINOMA, MORPHOLOGICALLY CONSISTENT WITH  PATIENT'S KNOWN HISTORY OF ENDOMETRIAL CARCINOMA.  Comment: The patient has a history of poorly differentiated endometrial adenocarcinoma (FIGO                          3)  diagnosed in June 2011. A limited panel of immunohistochemical stains was performed to sub classify the type of endometrial carcinoma. Stain results: p53: positive (focal strong subset in a background of wild type pattern)  ER: positive (diffuse) p 16: positive (focal strong subset) Napsin: negative Stain controls worked appropriately. This pattern of immunoreactivity is consistent with high grade endometrial adenocarcinoma, endometrioid type with focal aberrant expression of p53 and p16.  The staining pattern is not consistent with serous carcinoma. Preliminary findings were communicated to Dr. Rogue Bussing on 02/22/2016.  GROSS DESCRIPTION:  A. Labeled: right pelvic mass  Tissue fragment(s): 2  Size: cores of pink-tan tissue, and aggregate 2.0 x 0.1 x 0.1 cm  Description: cores received in  saline, touch prep prepared with Diff Quik stain slide, following staining instructed by Dr. Reuel Derby entirely formalin fixed, wrapped in lens paper, marked blue and submit                         ted in a mesh bag  Entirely submitted in one cassette(s).   B. Labeled: right pelvic mass  Tissue fragment(s): 3  Size: cores of pink-tan tissue 0.3-2 cm in length and in diameter 0.1 cm  Description: pink-tan course, wrapped in lens paper, marked blue and submitted in a mesh bag  Entirely submitted in 1 cassette(s).     Final Diagnosis performed by Quay Burow, MD.  Electronically signed 02/24/2016 1:43:43PM   The electronic signature indicates that the named Attending Pathologist has evaluated the specimen  Technical component performed at [facility], [address], [place], [place] [address] Lab: [phone] Dir: Darrick Penna. Evette Doffing, MD  Professional component performed at [facility], [facility], [place], [place], [place] [address] Lab: [phone] Dir: Dellia Nims. Reuel Derby, MD   ADDENDUM: Immunohistochemistry (IHC) Testing for Mismatch Repair (MMR) Proteins:  Results:  MLH1: Intact nuclear expression MSH2: Intact nuclear exp                         ression MSH6: Intact nuclear expression PMS2: Intact nuclear expression  IHC Interpretation: No loss of nuclear expression of MMR proteins: Low probability of MSI-H.  Testing was performed on block: B1  IHC slides were prepared by [facility], [place], [place], and interpreted by Dr. Dicie Beam. All controls stained appropriately.    Addendum #1 performed by Bryan Lemma, MD.  Electronically signed 02/29/2016 5:34:54PM    Technical component performed at [facility], [address] [place], [place] [address] Lab: [phone] Dir: Darrick Penna. Evette Doffing, MD  Professional component performed at [facility], [facility], [place], [place], [place]  [address] Lab: [phone] Dir: Dellia Nims. Reuel Derby, MD      Assessment:  MALAK DUCHESNEAU is a 70 y.o. female with a history of stage III endometrial carcinoma stats post TAH/BSO in 02/2010.  Pathology revealed grade III endometrial carcinoma.  Tumor was 3 x 3 cm and extended through the thickness of the myometrium.  There were 2 of 6 right pelvic lymph nodes and 2 of 6 periaortic nodes involved with metastatic disease. Peritoneal washings were negative.  Pathologic stage was T3N2M0 (stage III c2).  She received 6  cycles of carboplatin and Taxol from 03/27/2010 until 08/23/2010.  She received brachytherapy (completed 11/2010).  She developed a grade I/II neuropathy.  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 Following re-anastomosis, she developed a leak and underwent diverting colostomy.  She has had 3 incisional hernias.  PET scan on 02/16/2016 revealed a 4.4 x 5.0 cm hypermetabolic soft tissue mass along the right pelvic sidewall, presumably malignant metastatic lymphadenopathy.  There was also metastatic lymphadenopathy in the left para-aortic nodal station adjacent to the left renal hilum, and in the right retrocrural nodal station.  There was a metastatic lesion in the right ilium.  There was evidence of persistent partial small bowel obstruction.  CA125 results are as follows: 19 on 01/19/2014, 23.6 on 07/23/2014, 40.5 on 01/26/2015, 102.5 on 02/08/2016, 82.7 on 03/07/2016, and 77.9 on 04/02/2016.  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  MSI testing is pending.   She is s/p 2 cycles of carboplatin and Taxol (03/07/2016 - 04/05/2016).  Cycle #1 was complicated by 2 admissions to [facility] (03/18/2016 and 03/25/2016) with acute renal failure secondary to dehydration from diarrhea.  She was treated with IVF and anti-emetics.  Her diarrhea improved with sandostatin 100 mcg SQ q 8 hours.   Stool was negative for C diff.  Sodium bicarbonate was instituted for a low bicarbonate secondary to her diarrhea.   She has chronic renal insufficiency.  CrCl is 32.6 ml/min.  Symptomatically,  she is fatigued.  Stool consistency has improved on octreotide.  She has new lower extremity edema.  She has hypomagnesemia (0.9)  Plan: 1.  Labs today:  CBC with diff, BMP, Mg. 2.  Discuss echo results.  6.  Follow-up with Dr. Allen Norris today. 7.  Magnesium 2 gm IV today. 8.  RTC in 1 week for MD assess and labs (CBC with diff, BMP, Mg).   Lequita Asal, MD  04/07/2016, 3:19 PM

## 2016-04-09 ENCOUNTER — Inpatient Hospital Stay: Payer: Medicare Other

## 2016-04-09 ENCOUNTER — Inpatient Hospital Stay: Payer: Medicare Other | Admitting: Hematology and Oncology

## 2016-04-10 ENCOUNTER — Telehealth: Payer: Self-pay

## 2016-04-10 NOTE — Telephone Encounter (Signed)
Nothing new to add as treatment until after the colonoscopy and we have an idea what is going on. We can move the colonoscopy up if there is a open spot earlier.

## 2016-04-10 NOTE — Telephone Encounter (Signed)
Patient calls in with concerns of increased diarrhea x 3 days. She has a Colonoscopy scheduled on 05/15/16 but would like to move this up due to recent hospitalization and increased diarrhea.  She states that she was given an injection of Octreotide but this is not helping. She has been taking [name], Lomotil, and Sandostatin (?) at home but has had no relief.  I explained that we would have to get in touch with Dr. Allen Norris to obtain orders for further treatment and that we would give her a call back as soon as we speak with him.

## 2016-04-10 NOTE — Telephone Encounter (Signed)
See below and advise

## 2016-04-11 ENCOUNTER — Telehealth: Payer: Self-pay | Admitting: *Deleted

## 2016-04-11 ENCOUNTER — Other Ambulatory Visit: Payer: Self-pay | Admitting: Hematology and Oncology

## 2016-04-11 ENCOUNTER — Other Ambulatory Visit: Payer: Self-pay | Admitting: *Deleted

## 2016-04-11 ENCOUNTER — Telehealth: Payer: Self-pay | Admitting: Hematology and Oncology

## 2016-04-11 DIAGNOSIS — C541 Malignant neoplasm of endometrium: Secondary | ICD-10-CM

## 2016-04-11 DIAGNOSIS — R195 Other fecal abnormalities: Secondary | ICD-10-CM

## 2016-04-11 DIAGNOSIS — R197 Diarrhea, unspecified: Secondary | ICD-10-CM

## 2016-04-11 MED ORDER — OCTREOTIDE ACETATE 100 MCG/ML IJ SOLN: 100.0000 ug | Freq: Three times a day (TID) | INTRAMUSCULAR | 0 refills | Status: DC

## 2016-04-11 MED ORDER — PROMETHAZINE HCL 25 MG PO TABS
25.0000 mg | ORAL_TABLET | Freq: Three times a day (TID) | ORAL | 0 refills | Status: AC | PRN
Start: 2016-04-11 — End: ?

## 2016-04-11 NOTE — Telephone Encounter (Signed)
Patient reports ondansetron not working. She would also like to speak with Dr. Mike Gip.

## 2016-04-11 NOTE — Telephone Encounter (Signed)
Re:  Nausea and diarrhea  Patient called about feeling weak and ongoing diarrhea.  She received sandostatin LAR 10 mg.  She has been supplementing with short acting octreotide (helping).  Lomotil Immodium and Citrucel not helping.  We discussed potential IVF.  She is willing to come to clinic.  She is not willing to go to ER.  She will be called by nursing about appointment time in clinic.  Octreotide called in.  She is using Zofran 8 mg.  Her nausea may be caused by dehydration.  She requested Phenergan 25 mg tablets.  She requested that I contact Dr. Allen Norris.  Lequita Asal, MD

## 2016-04-11 NOTE — Telephone Encounter (Signed)
[place] and they do have sandostatin but only 9 and then they will order more.  Called pt and let her know that after she spoke to [place] that she sent in rx for phenergan and then the sandostatin they have limited supply but will give her 9 doses and order the remaining for a total of 42.  She can come tom. And get labs done and have IVF and if she needs potassium she will have to get that in [place]. She will start with tom labs and IVF and go from there.

## 2016-04-11 NOTE — Telephone Encounter (Signed)
LVM for pt to return my call.

## 2016-04-12 ENCOUNTER — Inpatient Hospital Stay: Payer: Medicare Other

## 2016-04-12 ENCOUNTER — Inpatient Hospital Stay: Payer: Medicare Other | Admitting: Family Medicine

## 2016-04-12 ENCOUNTER — Inpatient Hospital Stay
Admission: EM | Admit: 2016-04-12 | Discharge: 2016-04-20 | DRG: 682 | Disposition: A | Discharge status: Home / Self Care | Payer: Medicare Other | Attending: Internal Medicine | Admitting: Internal Medicine

## 2016-04-12 ENCOUNTER — Telehealth: Payer: Self-pay | Admitting: *Deleted

## 2016-04-12 ENCOUNTER — Other Ambulatory Visit: Payer: Self-pay

## 2016-04-12 ENCOUNTER — Encounter: Payer: Self-pay | Admitting: Medical Oncology

## 2016-04-12 DIAGNOSIS — I959 Hypotension, unspecified: Secondary | ICD-10-CM | POA: Diagnosis present

## 2016-04-12 DIAGNOSIS — K529 Noninfective gastroenteritis and colitis, unspecified: Secondary | ICD-10-CM | POA: Diagnosis present

## 2016-04-12 DIAGNOSIS — T451X5A Adverse effect of antineoplastic and immunosuppressive drugs, initial encounter: Secondary | ICD-10-CM | POA: Diagnosis present

## 2016-04-12 DIAGNOSIS — Z8542 Personal history of malignant neoplasm of other parts of uterus: Secondary | ICD-10-CM

## 2016-04-12 DIAGNOSIS — Z800 Family history of malignant neoplasm of digestive organs: Secondary | ICD-10-CM | POA: Diagnosis not present

## 2016-04-12 DIAGNOSIS — N289 Disorder of kidney and ureter, unspecified: Secondary | ICD-10-CM | POA: Diagnosis not present

## 2016-04-12 DIAGNOSIS — Z9049 Acquired absence of other specified parts of digestive tract: Secondary | ICD-10-CM | POA: Diagnosis not present

## 2016-04-12 DIAGNOSIS — Z90710 Acquired absence of both cervix and uterus: Secondary | ICD-10-CM | POA: Diagnosis not present

## 2016-04-12 DIAGNOSIS — Z87442 Personal history of urinary calculi: Secondary | ICD-10-CM

## 2016-04-12 DIAGNOSIS — E872 Acidosis: Secondary | ICD-10-CM | POA: Diagnosis present

## 2016-04-12 DIAGNOSIS — Z9851 Tubal ligation status: Secondary | ICD-10-CM | POA: Diagnosis not present

## 2016-04-12 DIAGNOSIS — N189 Chronic kidney disease, unspecified: Secondary | ICD-10-CM | POA: Diagnosis present

## 2016-04-12 DIAGNOSIS — E860 Dehydration: Secondary | ICD-10-CM | POA: Diagnosis not present

## 2016-04-12 DIAGNOSIS — D61810 Antineoplastic chemotherapy induced pancytopenia: Secondary | ICD-10-CM | POA: Diagnosis present

## 2016-04-12 DIAGNOSIS — R630 Anorexia: Secondary | ICD-10-CM | POA: Diagnosis not present

## 2016-04-12 DIAGNOSIS — I471 Supraventricular tachycardia: Secondary | ICD-10-CM | POA: Diagnosis present

## 2016-04-12 DIAGNOSIS — Z79899 Other long term (current) drug therapy: Secondary | ICD-10-CM | POA: Diagnosis not present

## 2016-04-12 DIAGNOSIS — N179 Acute kidney failure, unspecified: Principal | ICD-10-CM | POA: Diagnosis present

## 2016-04-12 DIAGNOSIS — K432 Incisional hernia without obstruction or gangrene: Secondary | ICD-10-CM

## 2016-04-12 DIAGNOSIS — Z85828 Personal history of other malignant neoplasm of skin: Secondary | ICD-10-CM | POA: Diagnosis not present

## 2016-04-12 DIAGNOSIS — E669 Obesity, unspecified: Secondary | ICD-10-CM | POA: Diagnosis present

## 2016-04-12 DIAGNOSIS — G629 Polyneuropathy, unspecified: Secondary | ICD-10-CM | POA: Diagnosis present

## 2016-04-12 DIAGNOSIS — R195 Other fecal abnormalities: Secondary | ICD-10-CM

## 2016-04-12 DIAGNOSIS — C541 Malignant neoplasm of endometrium: Secondary | ICD-10-CM | POA: Diagnosis present

## 2016-04-12 DIAGNOSIS — D6481 Anemia due to antineoplastic chemotherapy: Secondary | ICD-10-CM

## 2016-04-12 DIAGNOSIS — Z933 Colostomy status: Secondary | ICD-10-CM | POA: Diagnosis not present

## 2016-04-12 DIAGNOSIS — R112 Nausea with vomiting, unspecified: Secondary | ICD-10-CM

## 2016-04-12 DIAGNOSIS — T451X5S Adverse effect of antineoplastic and immunosuppressive drugs, sequela: Secondary | ICD-10-CM | POA: Diagnosis not present

## 2016-04-12 DIAGNOSIS — D6959 Other secondary thrombocytopenia: Secondary | ICD-10-CM | POA: Diagnosis present

## 2016-04-12 DIAGNOSIS — D72819 Decreased white blood cell count, unspecified: Secondary | ICD-10-CM

## 2016-04-12 DIAGNOSIS — Z803 Family history of malignant neoplasm of breast: Secondary | ICD-10-CM

## 2016-04-12 DIAGNOSIS — C779 Secondary and unspecified malignant neoplasm of lymph node, unspecified: Secondary | ICD-10-CM | POA: Diagnosis not present

## 2016-04-12 DIAGNOSIS — D701 Agranulocytosis secondary to cancer chemotherapy: Secondary | ICD-10-CM | POA: Diagnosis present

## 2016-04-12 DIAGNOSIS — E878 Other disorders of electrolyte and fluid balance, not elsewhere classified: Secondary | ICD-10-CM

## 2016-04-12 DIAGNOSIS — F17210 Nicotine dependence, cigarettes, uncomplicated: Secondary | ICD-10-CM | POA: Diagnosis present

## 2016-04-12 DIAGNOSIS — E876 Hypokalemia: Secondary | ICD-10-CM | POA: Diagnosis present

## 2016-04-12 DIAGNOSIS — D709 Neutropenia, unspecified: Secondary | ICD-10-CM | POA: Diagnosis present

## 2016-04-12 DIAGNOSIS — K5660 Unspecified intestinal obstruction: Secondary | ICD-10-CM

## 2016-04-12 DIAGNOSIS — D696 Thrombocytopenia, unspecified: Secondary | ICD-10-CM

## 2016-04-12 LAB — PHOSPHORUS: Phosphorus: 5.9 mg/dL — ABNORMAL HIGH (ref 2.5–4.6)

## 2016-04-12 LAB — MAGNESIUM: Magnesium: 1.4 mg/dL — ABNORMAL LOW (ref 1.7–2.4)

## 2016-04-12 LAB — ALBUMIN: Albumin: 1.8 g/dL — ABNORMAL LOW (ref 3.5–5.0)

## 2016-04-12 MED ORDER — MAGNESIUM SULFATE 4 GM/100ML IV SOLN
4.0000 g | Freq: Once | INTRAVENOUS | Status: AC
  Administered 2016-04-12: 4 g via INTRAVENOUS
  Filled 2016-04-12: qty 100

## 2016-04-12 MED ORDER — ONDANSETRON HCL 4 MG/2ML IJ SOLN
4.0000 mg | Freq: Four times a day (QID) | INTRAMUSCULAR | Status: DC | PRN
  Administered 2016-04-14: 4 mg via INTRAVENOUS
  Filled 2016-04-12: qty 2

## 2016-04-12 MED ORDER — ONDANSETRON HCL 4 MG PO TABS: 4.0000 mg | ORAL_TABLET | Freq: Four times a day (QID) | ORAL | Status: DC | PRN

## 2016-04-12 MED ORDER — ACETAMINOPHEN 650 MG RE SUPP: 650.0000 mg | Freq: Four times a day (QID) | RECTAL | Status: DC | PRN

## 2016-04-12 MED ORDER — OCTREOTIDE ACETATE 50 MCG/ML IJ SOLN: 100.0000 ug | Freq: Two times a day (BID) | INTRAMUSCULAR | Status: DC

## 2016-04-12 MED ORDER — OXYCODONE HCL 5 MG PO TABS
5.0000 mg | ORAL_TABLET | ORAL | Status: DC | PRN
  Administered 2016-04-15: 5 mg via ORAL
  Filled 2016-04-12: qty 1

## 2016-04-12 MED ORDER — POTASSIUM CHLORIDE IN NACL 20-0.9 MEQ/L-% IV SOLN
INTRAVENOUS | Status: DC
  Administered 2016-04-12 – 2016-04-16 (×11): via INTRAVENOUS
  Filled 2016-04-12 (×16): qty 1000

## 2016-04-12 MED ORDER — SODIUM CHLORIDE 0.9 % IV SOLN
Freq: Once | INTRAVENOUS | Status: AC
  Administered 2016-04-12: 09:00:00 via INTRAVENOUS
  Filled 2016-04-12: qty 1000

## 2016-04-12 MED ORDER — ACETAMINOPHEN 325 MG PO TABS
650.0000 mg | ORAL_TABLET | Freq: Four times a day (QID) | ORAL | Status: DC | PRN
Start: 2016-04-12 — End: 2016-04-20
  Administered 2016-04-13 – 2016-04-15 (×5): 650 mg via ORAL
  Filled 2016-04-12 (×5): qty 2

## 2016-04-12 MED ORDER — ALBUTEROL SULFATE (2.5 MG/3ML) 0.083% IN NEBU: 2.5000 mg | INHALATION_SOLUTION | RESPIRATORY_TRACT | Status: DC | PRN

## 2016-04-12 MED ORDER — HEPARIN SODIUM (PORCINE) 5000 UNIT/ML IJ SOLN
5000.0000 [IU] | Freq: Three times a day (TID) | INTRAMUSCULAR | Status: DC
  Administered 2016-04-12 – 2016-04-15 (×10): 5000 [IU] via SUBCUTANEOUS
  Filled 2016-04-12: qty 2
  Filled 2016-04-12 (×8): qty 1

## 2016-04-12 MED ORDER — DIPHENOXYLATE-ATROPINE 2.5-0.025 MG PO TABS: 1.0000 | ORAL_TABLET | Freq: Four times a day (QID) | ORAL | Status: DC | PRN

## 2016-04-12 MED ORDER — LOPERAMIDE HCL 2 MG PO CAPS
2.0000 mg | ORAL_CAPSULE | Freq: Four times a day (QID) | ORAL | Status: DC
  Administered 2016-04-12 – 2016-04-15 (×11): 2 mg via ORAL
  Filled 2016-04-12 (×15): qty 1

## 2016-04-12 MED ORDER — SODIUM CHLORIDE 0.9 % IV BOLUS (SEPSIS)
1000.0000 mL | Freq: Once | INTRAVENOUS | Status: AC
  Administered 2016-04-12: 1000 mL via INTRAVENOUS

## 2016-04-12 MED ORDER — SODIUM CHLORIDE 0.9% FLUSH
3.0000 mL | Freq: Two times a day (BID) | INTRAVENOUS | Status: DC
  Administered 2016-04-13 – 2016-04-20 (×4): 3 mL via INTRAVENOUS

## 2016-04-12 MED ORDER — SODIUM CHLORIDE 0.9 % IV SOLN
1.0000 g | Freq: Once | INTRAVENOUS | Status: AC
  Administered 2016-04-12: 1 g via INTRAVENOUS
  Filled 2016-04-12: qty 10

## 2016-04-12 MED ORDER — LORAZEPAM 0.5 MG PO TABS
0.5000 mg | ORAL_TABLET | Freq: Three times a day (TID) | ORAL | Status: DC | PRN
  Administered 2016-04-12 – 2016-04-15 (×4): 0.5 mg via ORAL
  Filled 2016-04-12 (×4): qty 1

## 2016-04-12 MED ORDER — PROMETHAZINE HCL 25 MG/ML IJ SOLN
12.5000 mg | Freq: Four times a day (QID) | INTRAMUSCULAR | Status: DC | PRN
Start: 2016-04-12 — End: 2016-04-20
  Administered 2016-04-13 – 2016-04-14 (×2): 12.5 mg via INTRAVENOUS
  Filled 2016-04-12 (×2): qty 1

## 2016-04-12 NOTE — Telephone Encounter (Signed)
I called ED Triage and spoke with [facility]. Gave her report. Patient has Stage III Endometrial carcinoma. Took her last chemo on 04/05/16 with Carboplatin and Taxol. She is now dehydrated secondary to diarrhea and poor po intake. Patient had normal BUN and creatinine on 04/05/16 . Today BUN 86 and creat is 4.35. Calcium is 6.4 and WBC is 0.9.Patient is in Renal Failure. Patient received 1 liter of NS in [place] center this am. Shanda Howells is accessed in R chest. Husband will be bringing to ED via private vehicle. Patient has had 2 recent hospitalizations on 7/16 and 7/23 for acute renal failure.

## 2016-04-12 NOTE — Progress Notes (Signed)
Received call from Lab with critical wbc 0.9. I called Dr Mike Gip nurse with critical lab value. Also reported that patient is very weak. BP 80/50 HR 109. Patient currently getting IVF's for dehydration. Lab called back with critical calcium of 6.4.  Patient is drinking ginger ale and eating peanut butter crackers. Patient denies pain. No nausea at present. Husband restarted octreotide subcutaneous injections yesterday every 8 hours. She reports she has been having liquid output in ostomy. Husband said he noted some small formed stool in bag this am. Dr Mike Gip called and said her BUN and creatinine have increased and patient is in renal failure and needs to go to ER. Patient was informed. She wants her husband to take her to the ER. She wants me to leave in her port needle as well.

## 2016-04-12 NOTE — ED Provider Notes (Signed)
[facility] Emergency Department Provider Note ____________________________________________  Time seen:  I have reviewed the triage vital signs and the triage nursing note.  HISTORY  Chief Complaint Dehydration and Abnormal Lab   Historian Patient and husband  HPI [name] is a 70 y.o. female with recurrence of endometrial cancer, now metastatic, who recently started chemotherapy on this past Thursday. Today she was at the cancer center for reevaluation and was receiving fluids for low blood pressure, and found to be neutropenic without a fever, but in acute renal failure. It sounds like she received 1 L of normal saline was referred here to the emergency department.  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 SinceThursday however, she's had decreased by mouth intake with nausea and decreased appetite after the chemotherapy. She has been on octreotide shots, as well as Phenergan, and still is having trouble with by mouth intake.  Eyes abdominal pain. Denies fevers. Denies cough. Denies chest pain. Really denies abdominal pain as well. Last urine output was early this morning.    Past Medical History:  Diagnosis Date  . Cancer [facility]) 2011   endometrial  . Chronic kidney disease    STONES  . Diverticulitis of colon (without mention of hemorrhage)   . History of kidney stones 2011  . Mechanical complication of colostomy and enterostomy   . Obesity, unspecified   . Personal history of tobacco use, presenting hazards to health     Patient Active Problem List   Diagnosis Date Noted  . Bacteriuria with pyuria 03/28/2016  . Acute respiratory failure with hypoxia ([place]) 03/28/2016  . Atelectasis 03/28/2016  . Metabolic acidosis [id number]  . Diarrhea   . Dehydration   . Acute renal failure  (ARF) ([place]) 03/18/2016  . Hypomagnesemia 03/07/2016  . Watery stools 01/23/2016  . Generalized abdominal pain 01/23/2016  . Endometrial cancer ([place]) 02/10/2015  . Paroxysmal supraventricular tachycardia ([address]) 05/24/2014  . History of endometrial cancer 04/09/2014  . Tubular adenomas removed from the transverse colon in June 2010. Hyperplastic polyps identified in the sigmoid colon. 03/02/2014  . History of colon polyps 03/02/2014  . Ventral hernia 09/29/2013  . 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 Complication of external stoma of gastrointestinal tract 02/19/2013    Past Surgical History:  Procedure Laterality Date  . ABDOMINAL HYSTERECTOMY  2011  . APPENDECTOMY    . CHOLECYSTECTOMY  05/07/14   Incidental during ventral hernia repair.   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 COLON SURGERY  05/20/1999   Drainage of pelvic abscess, end colostomy  . COLON SURGERY  11/15/2010   Revision of colostomy stoma  . COLONOSCOPY  June 2010,03/31/14   Tubular adenoma of the transverse colon, hyperplastic polyps of the sigmoid.  Marland Kitchen COLOSTOMY  2011  . DILATION AND CURETTAGE OF UTERUS    . HERNIA REPAIR  04/06/14   ventral hernia, tetro rectus Ventralex ST mesh, 20 x 33 cm.   Marland Kitchen HYSTEROSCOPY  2011  . LITHOTRIPSY  2009  . MOLE REMOVAL  2003  . PORTACATH PLACEMENT N/A 03/01/2016   Procedure: INSERTION PORT-A-CATH;  Surgeon: [name], MD;  Location: [facility] ORS;  Service: General;  Laterality: N/A;  . PYELOLITHOTOMY    . skin cancer removal  02/2014  .  TUBAL LIGATION      Prior to Admission medications   Medication Sig Start Date End Date Taking? Authorizing Provider  ALPRAZolam [name]) 0.25 MG tablet Take 0.25 mg by mouth at bedtime as needed for anxiety.  02/09/16   Historical Provider, MD  HYDROcodone-acetaminophen (NORCO/VICODIN) 5-325 MG tablet  03/01/16   Historical Provider, MD  KLOR-CON M20 20 MEQ  tablet  03/23/16   Historical Provider, MD  octreotide (SANDOSTATIN) 100 MCG/ML SOLN injection Inject 1 mL (100 mcg total) into the skin every 8 (eight) hours. 04/11/16   Lequita Asal, MD  ondansetron (ZOFRAN) 8 MG tablet  02/28/16   Historical Provider, MD  potassium chloride 20 MEQ TBCR Take 20 mEq by mouth 2 (two) times daily. 03/22/16   [name], MD  promethazine (PHENERGAN) 25 MG tablet Take 1 tablet (25 mg total) by mouth every 8 (eight) hours as needed for nausea or vomiting. 04/11/16   Lequita Asal, MD  sertraline (ZOLOFT) 50 MG tablet  02/09/16   Historical Provider, MD  sodium bicarbonate 650 MG tablet Take 1 tablet (650 mg total) by mouth 2 (two) times daily. 03/28/16   Theodoro Grist, MD  Syringe, Disposable, 1 ML MISC 1 Units by Does not apply route 3 (three) times daily. 03/23/16   [name], MD    No Known Allergies  Family History  Problem Relation Age of Onset  . Breast cancer Maternal Grandmother   . Colon cancer Mother   . Cancer Mother     Social History Social History  Substance Use Topics  . Smoking status: Current Every Day Smoker    Packs/day: 0.25    Years: 15.00    Types: Cigarettes  . Smokeless tobacco: Never Used  . Alcohol use No    Review of Systems  Constitutional: Negative for fever. Eyes: Negative for visual changes. ENT: Negative for sore throat. Cardiovascular: Negative for chest pain. Respiratory: Negative for shortness of breath. Gastrointestinal: Negative for abdominal pain, vomiting and diarrhea. Genitourinary: Negative for dysuria. Musculoskeletal: Negative for back pain. Skin: Negative for rash. Neurological: Negative for headache. 10 point Review of Systems otherwise negative ____________________________________________   PHYSICAL EXAM:  VITAL SIGNS: ED Triage Vitals  Enc Vitals Group     BP 04/12/16 1107 (!) 86/67     Pulse Rate 04/12/16 1107 100     Resp 04/12/16 1107 (!) 21     Temp 04/12/16 1157 97.5 F (36.4  C)     Temp Source 04/12/16 1157 Oral     SpO2 04/12/16 1107 99 %     Weight 04/12/16 1107 160 lb (72.6 kg)     Height 04/12/16 1107 5\' 2"  (1.575 m)     Head Circumference --      Peak Flow --      Pain Score 04/12/16 1108 0     Pain Loc --      Pain Edu? --      Excl. in [place]? --      Constitutional: Alert and oriented. Well appearing and in no distress. HEENT   Head: Normocephalic and atraumatic.      Eyes: Conjunctivae are normal. PERRL. Normal extraocular movements.      Ears:         Nose: No congestion/rhinnorhea.   Mouth/Throat: Mucous membranes are Mildly dry.   Neck: No stridor. Cardiovascular/Chest: Normal rate, regular rhythm.  No murmurs, rubs, or gallops. Respiratory: Normal respiratory effort without tachypnea nor retractions. Breath sounds are clear and equal  bilaterally. No wheezes/rales/rhonchi. Gastrointestinal: Soft. No distention, no guarding, no rebound. Nontender.  Ostomy site left abdomen. Genitourinary/rectal:Deferred Musculoskeletal: Nontender with normal range of motion in all extremities. No joint effusions.  No lower extremity tenderness.  No edema. Neurologic:  Normal speech and language. No gross or focal neurologic deficits are appreciated. Skin:  Skin is warm, dry and intact. No rash noted. Psychiatric: Mood and affect are normal. Speech and behavior are normal. Patient exhibits appropriate insight and judgment.  ____________________________________________   EKG I, Lisa Roca, MD, the attending physician have personally viewed and interpreted all ECGs.  96bpm.  NsR.  Narrow qrs. Normal axis.  Nonspecific st and t wave.  qtc 577 ____________________________________________  LABS (pertinent positives/negatives)  Labs Reviewed - No data to display  ____________________________________________  RADIOLOGY All Xrays were viewed by me. Imaging interpreted by  Radiologist.  None __________________________________________  PROCEDURES  Procedure(s) performed: None  Critical Care performed: None  ____________________________________________   ED COURSE / ASSESSMENT AND PLAN  Pertinent labs & imaging results that were available during my care of the patient were reviewed by me and considered in my medical decision making (see chart for details).   I spoke with Dr. Candiss Norse regarding acute renal failure and likely cause is fluid losses from ongoing loose stool in ostomy and decreased by mouth due to the recent chemotherapy. I was considering tumor lysis syndrome with low calcium , but corrected calcium by low albumin from last week shows corrected calcium around 7.8.  Patient is asymptomatic with respect to low potassium, but I was questioning a due to the QTC prolongation.  Ultimately, the patient received a sound with a liter of fluid at the cancer Center this morning prior to receiving laboratory studies showing the acute renal failure, and is receiving a second liter bolus here in the emergency room.  I spoke with Dr. Darvin Neighbours for hospital admission.    CONSULTATIONS:   Dr. Candiss Norse, nephrology by phone - recommends fluids, and I spoke with hospitalist for admission.    Patient / Family / Caregiver informed of clinical course, medical decision-making process, and agree with plan.   ___________________________________________   FINAL CLINICAL IMPRESSION(S) / ED DIAGNOSES   Final diagnoses:  Acute renal failure, unspecified acute renal failure type [facility])              Note: This dictation was prepared with Dragon dictation. Any transcriptional errors that result from this process are unintentional    Lisa Roca, MD 04/12/16 1215

## 2016-04-12 NOTE — ED Triage Notes (Signed)
Pt sent over from [facility] cancer center with reports that pt was there for fluids when they did lab work and it was noted that pts BUN was 86 and creat of 4.35. Calcium was low also. Pt has been receiving chemo for stage 3 endometrial CA, last treatment was 8/3 and since pt has been having diarrhea. Recent hospitalizations for renal failure. Pt has port that is accessed.

## 2016-04-12 NOTE — ED Notes (Signed)
Pt reports chemo treatment on the 30th and constant diarrhea since.  Pt used antidiarrheal at home with some relief.

## 2016-04-12 NOTE — Telephone Encounter (Signed)
[name] called back from the lab. She called earlier with critical WBC of 0.9 now she has a critical calcium of 6.4. I will call [name] RN for Dr Mike Gip.

## 2016-04-12 NOTE — H&P (Signed)
[facility] NAME: [name]    MR#:  [id number]  DATE OF BIRTH:  Oct 15, 1945  DATE OF ADMISSION:  04/12/2016  PRIMARY CARE PHYSICIAN: [name], MD   REQUESTING/REFERRING PHYSICIAN: Dr. Reita Cliche  CHIEF COMPLAINT:   Chief Complaint  Patient presents with  . Dehydration  . Abnormal Lab    HISTORY OF PRESENT ILLNESS:  [name]  is a 70 y.o. female with a known history of Chronic diarrhea status post colostomy, endometrial carcinoma with recent chemotherapy presents to the emergency room from cancer center due to acute kidney injury with creatinine of 4.3. Patient had similar admissions recently twice causing dehydration from chronic diarrhea worsened by chemotherapy along with vomiting and decreased oral intake. Patient was started on Sandostatin Depakote shot. Her symptoms have worsened acutely since chemotherapy on 04/05/2016. She was initially called to the cancer center for IV hydration but with abnormal blood work was sent to the emergency room. She has not noticed any hematemesis or melena or blood in stool. No fever. No recent antibiotic use. C. difficile checked recently was negative. She is also neutropenic from her recent chemotherapy.  PAST MEDICAL HISTORY:   Past Medical History:  Diagnosis Date  . Cancer [facility]) 2011   endometrial  . Chronic kidney disease    STONES  . Diverticulitis of colon (without mention of hemorrhage)   . History of kidney stones 2011  . Mechanical complication of colostomy and enterostomy   . Obesity, unspecified   . Personal history of tobacco use, presenting hazards to health     PAST SURGICAL HISTORY:   Past Surgical History:  Procedure Laterality Date  . ABDOMINAL HYSTERECTOMY  2011  . APPENDECTOMY    . CHOLECYSTECTOMY  05/07/14   Incidental during ventral hernia repair.   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 COLON SURGERY  05/20/1999   Drainage of pelvic abscess, end colostomy  . COLON SURGERY  11/15/2010   Revision of colostomy stoma  . COLONOSCOPY  June 2010,03/31/14   Tubular adenoma of the transverse colon, hyperplastic polyps of the sigmoid.  Marland Kitchen COLOSTOMY  2011  . DILATION AND CURETTAGE OF UTERUS    . HERNIA REPAIR  04/06/14   ventral hernia, tetro rectus Ventralex ST mesh, 20 x 33 cm.   Marland Kitchen HYSTEROSCOPY  2011  . LITHOTRIPSY  2009  . MOLE REMOVAL  2003  . PORTACATH PLACEMENT N/A 03/01/2016   Procedure: INSERTION PORT-A-CATH;  Surgeon: [name], MD;  Location: [facility] ORS;  Service: General;  Laterality: N/A;  . PYELOLITHOTOMY    . skin cancer removal  02/2014  . TUBAL LIGATION      SOCIAL HISTORY:   Social History  Substance Use Topics  . Smoking status: Current Every Day Smoker    Packs/day: 0.25    Years: 15.00    Types: Cigarettes  . Smokeless tobacco: Never Used  . Alcohol use No    FAMILY HISTORY:   Family History  Problem Relation Age of Onset  . Breast cancer Maternal Grandmother   . Colon cancer Mother   . Cancer Mother     DRUG ALLERGIES:  No Known Allergies  REVIEW OF SYSTEMS:   ROS  MEDICATIONS AT HOME:   Prior to Admission medications   Medication Sig Start Date End Date Taking? Authorizing Provider  octreotide (SANDOSTATIN) 100 MCG/ML SOLN injection Inject  1 mL (100 mcg total) into the skin every 8 (eight) hours. 04/11/16  Yes Lequita Asal, MD  ondansetron (ZOFRAN) 8 MG tablet Take 8 mg by mouth 2 (two) times daily as needed.  02/28/16  Yes Historical Provider, MD  potassium chloride 20 MEQ TBCR Take 20 mEq by mouth 2 (two) times daily. 03/22/16  Yes Bascom Biel, MD  promethazine (PHENERGAN) 25 MG tablet Take 1 tablet (25 mg total) by mouth every 8 (eight) hours as needed for nausea or vomiting. 04/11/16  Yes Lequita Asal, MD  sodium bicarbonate 650 MG tablet Take 1 tablet (650 mg total) by mouth 2 (two)  times daily. 03/28/16  Yes Theodoro Grist, MD  Syringe, Disposable, 1 ML MISC 1 Units by Does not apply route 3 (three) times daily. 03/23/16   Yoona Ishii, MD     VITAL SIGNS:  Blood pressure 102/67, pulse 88, temperature 97.5 F (36.4 C), temperature source Oral, resp. rate 18, height 5\' 2"  (1.575 m), weight 72.6 kg (160 lb), SpO2 98 %.  PHYSICAL EXAMINATION:  Physical Exam  GENERAL:  70 y.o.-year-old patient lying in the bed with no acute distress.  EYES: Pupils equal, round, reactive to light and accommodation. No scleral icterus. Extraocular muscles intact.  HEENT: Head atraumatic, normocephalic. Oropharynx and nasopharynx clear. No oropharyngeal erythema, Dry oral mucosa  NECK:  Supple, no jugular venous distention. No thyroid enlargement, no tenderness.  LUNGS: Normal breath sounds bilaterally, no wheezing, rales, rhonchi. No use of accessory muscles of respiration.  CARDIOVASCULAR: S1, S2 normal. No murmurs, rubs, or gallops.  ABDOMEN: Soft, nontender, nondistended. Bowel sounds present. No organomegaly or mass. Colostomy bag in place with watery stool EXTREMITIES: No pedal edema, cyanosis, or clubbing. + 2 pedal & radial pulses b/l.   NEUROLOGIC: Cranial nerves II through XII are intact. No focal Motor or sensory deficits appreciated b/l PSYCHIATRIC: The patient is alert and oriented x 3. Good affect.  SKIN: No obvious rash, lesion, or ulcer.   LABORATORY PANEL:   CBC  Recent Labs Lab 04/12/16 0843  WBC 0.9*  HGB 12.2  HCT 36.6  PLT 192   ------------------------------------------------------------------------------------------------------------------  Chemistries   Recent Labs Lab 04/12/16 0843  NA 134*  K 3.7  CL 93*  CO2 21*  GLUCOSE 160*  BUN 86*  CREATININE 4.35*  CALCIUM 6.4*  MG 1.4*   ------------------------------------------------------------------------------------------------------------------  Cardiac Enzymes No results for input(s):  TROPONINI in the last 168 hours. ------------------------------------------------------------------------------------------------------------------  RADIOLOGY:  No results found.   IMPRESSION AND PLAN:   * Acute kidney injury due to severe dehydration from diarrhea and decreased oral intake/ vomiting Replace IV fluids aggressively. Bolus normal saline and later maintenance fluids at 125 with KCl. We'll also replace magnesium and potassium. Monitor input and output. Consult nephrology if no improvement.  * Chronic diarrhea worsened by chemotherapy Patient has received octreotide drip or shot one week back. At this time due to prolonged QT on EKG we will hold off on daily shots. Consult GI Dr. wall as he was planning on scheduling patient for a colonoscopy.  * Prolonged QT is likely due to hypomagnesemia. Replace magnesium. Monitor on telemetry.  * Endometrial carcinoma Patient follows with Dr. Mike Gip as outpatient  * Neutropenia - due to chemotherapy Neutropenic precautions. Consult oncology.  * Hypocalcemia Check albumin level. Corrected albumin was normal in the past. Replace if needed.  * DVT prophylaxis with heparin subcutaneous  All the records are reviewed and case discussed with ED provider. Management plans  discussed with the patient, family and they are in agreement.  CODE STATUS: FULL CODE  TOTAL TIME TAKING CARE OF THIS PATIENT: 40 minutes.   [name] R M.D on 04/12/2016 at 12:43 PM  Between 7am to 6pm - Pager - [phone]  After 6pm go to [email or web address] - password [email or web address] [facility]  Office  [phone]  CC: Primary care physician; [name], MD  Note: This dictation was prepared with Dragon dictation along with smaller phrase technology. Any transcriptional errors that result from this process are unintentional.

## 2016-04-12 NOTE — Consult Note (Signed)
[facility]  Date of admission:  04/12/2016  Inpatient day:  04/12/16  Consulting physician:  Dr [name]  Reason for Consultation:  Neutropenia  Chief Complaint: [name] is a 70 y.o. female with recurrent endometrial cancer who was admitted on day 8 s/p cycle #2 carboplatin and Taxol with acute renal insufficiency,   HPI:  The patient was diagnosed with endometrial carcinoma in 02/2010. She underwent TAH/BSO.  Pathology revealed grade III endometrial carcinoma. Tumor was 3 x 3 cm and extended through the thickness of the myometrium. There were 2 of 6 right pelvic lymph nodes and 2 of 6 periaortic nodes involved with metastatic disease. Peritoneal washings were negative. Pathologic stage was T3N2M0 (stage III c2).  She received 6 cycles of carboplatin and Taxol from 03/27/2010 until 08/23/2010. She received brachytherapy (completed 11/2010). 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 Following re-anastomosis, she developed a leak and underwent diverting colostomy. She has had 3 incisional hernias.  PET scan on 02/16/2016 revealed a 4.4 x 5.0 cm hypermetabolic soft tissue mass along the right pelvic sidewall. There was lymphadenopathy in the left para-aortic nodal station adjacent to the left renal hilum, and in the right retrocrural nodal station. There was a metastatic lesion in the right ilium. There was evidence of persistent partial small bowel obstruction.  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 MSI testing was negative.  She received cycle #1 carboplatin and Taxol on 03/07/2016.  Prior to chemotherapy, she had baseline abdominal discomfort, diarrhea, and intermittent nausea and vomiting.  She tolerated her chemotherapy well.  Follow-up labs in clinic on 03/16/2016 revealed a low  magnesium (0.9), potassium (3.1) and slightly elevated Cr (1.98).  She received IVF, potassium and magnesium in clinic.  Post infusion, she felt better.  She was admitted to [facility] from 03/18/2016 - 03/23/2016 with acute renal failure secondary to dehydration from diarrhea.  She was treated with IVF and anti-emetics.  [name] and Lomotil were unsuccessful.  Questran was tried.  Her diarrhea improved with sandostatin 100 mcg SQ q 8 hours.  She was readmitted to [facility] from 03/25/2016 - 03/28/2016 with acute renal failure secondary to diarrhea, nausea, abdominal pain, and weakness.   Stool was negative for C diff.  Sodium bicarbonate was instituted for a low bicarbonate secondary to her diarrhea.  She continued short acting octreotide.  She received cycle #2 carboplatin and Taxol on 04/05/2016.  She also received sandostatin LAR 10 mg.   She contacted the clinic yesterday with weakness, fatigue, nausea, vomiting and increased output from her ostomy.  She declined evaluation in the ER and was seen in the [facility] clinic today for IVF.  Labs revealed a BUN 86, creatinine 4.35 (0.98 on 04/05/2016), calcium 6.4, and magnesium 1.4.  She was directed to the ER for admission.  Symptomatically, she is feeling better since initiation of fluids.  She noted decreased urine output at home.  She denies any fever.   Past Medical History:  Diagnosis Date  . Cancer [facility]) 2011   endometrial  . Chronic kidney disease    STONES  . Diverticulitis of colon (without mention of hemorrhage)   . History of kidney stones 2011  . Mechanical complication of colostomy and enterostomy   . Obesity, unspecified   . Personal history of tobacco use, presenting hazards to health     Past Surgical History:  Procedure  Laterality Date  . ABDOMINAL HYSTERECTOMY  2011  . APPENDECTOMY    . CHOLECYSTECTOMY  05/07/14   Incidental during ventral hernia repair.   Marland Kitchen COLON SURGERY  05/10/2010   Sigmoid colectomy with takedown of colovaginal  fistula, drainage pelvic side wall seroma  . COLON SURGERY  05/20/1999   Drainage of pelvic abscess, end colostomy  . COLON SURGERY  11/15/2010   Revision of colostomy stoma  . COLONOSCOPY  June 2010,03/31/14   Tubular adenoma of the transverse colon, hyperplastic polyps of the sigmoid.  Marland Kitchen COLOSTOMY  2011  . DILATION AND CURETTAGE OF UTERUS    . HERNIA REPAIR  04/06/14   ventral hernia, tetro rectus Ventralex ST mesh, 20 x 33 cm.   Marland Kitchen HYSTEROSCOPY  2011  . LITHOTRIPSY  2009  . MOLE REMOVAL  2003  . PORTACATH PLACEMENT N/A 03/01/2016   Procedure: INSERTION PORT-A-CATH;  Surgeon: [name], MD;  Location: [facility] ORS;  Service: General;  Laterality: N/A;  . PYELOLITHOTOMY    . skin cancer removal  02/2014  . TUBAL LIGATION      Family History  Problem Relation Age of Onset  . Breast cancer Maternal Grandmother   . Colon cancer Mother   . Cancer Mother     Social History:  reports that she has been smoking Cigarettes.  She has a 3.75 pack-year smoking history. She has never used smokeless tobacco. She reports that she does not drink alcohol or use drugs.  The patient is alone today.  Allergies: No Known Allergies  Medications Prior to Admission  Medication Sig Dispense Refill  . octreotide (SANDOSTATIN) 100 MCG/ML SOLN injection Inject 1 mL (100 mcg total) into the skin every 8 (eight) hours. 13 mL 0  . ondansetron (ZOFRAN) 8 MG tablet Take 8 mg by mouth 2 (two) times daily as needed.     . potassium chloride 20 MEQ TBCR Take 20 mEq by mouth 2 (two) times daily. 60 tablet 0  . promethazine (PHENERGAN) 25 MG tablet Take 1 tablet (25 mg total) by mouth every 8 (eight) hours as needed for nausea or vomiting. 30 tablet 0  . sodium bicarbonate 650 MG tablet Take 1 tablet (650 mg total) by mouth 2 (two) times daily. 60 tablet 6  . Syringe, Disposable, 1 ML MISC 1 Units by Does not apply route 3 (three) times daily. 25 each 0    Review of Systems: GENERAL:  Fatigued, but a little  better since fluids.  No fevers or sweats. PERFORMANCE STATUS (ECOG):  2 HEENT:  No visual changes, runny nose, sore throat, mouth sores or tenderness. Lungs: No shortness of breath or cough.  No hemoptysis. Cardiac:  No chest pain, palpitations, orthopnea, or PND. GI:  Nausea, vomting.  Liquid stool output from ostomy.  No constipation, melena or hematochezia. GU:  Decreased urine output.  No urgency, frequency, dysuria, or hematuria. Musculoskeletal:  No back pain.  No joint pain.  No muscle tenderness. Extremities:  No pain or swelling. Skin:  No rashes or skin changes. Neuro:  General fatigue.  No headache, numbness or weakness, balance or coordination issues. Endocrine:  No diabetes, thyroid issues, hot flashes or night sweats. Psych:  No mood changes, depression or anxiety. Pain:  No focal pain. Review of systems:  All other systems reviewed and found to be negative.   Physical Exam:  Blood pressure (!) 100/59, pulse (!) 110, temperature 98.4 F (36.9 C), temperature source Oral, resp. rate 20, height _0  (1.575  m), weight 160 lb (72.6 kg), SpO2 93 %.  GENERAL:  Well developed, well nourished, fatigued appearing woman lying comfortably on the medical unit in no acute distress. MENTAL STATUS:  Alert and oriented to person, place and time. HEAD:  Short gray hair.  Normocephalic, atraumatic, face symmetric, no Cushingoid features. EYES:  Blue eyes.  Pupils equal round and reactive to light and accomodation.  No conjunctivitis or scleral icterus. ENT:  Oropharynx clear without lesion.  Tongue normal. Mucous membranes moist.  RESPIRATORY:  Clear to auscultation without rales, wheezes or rhonchi. CARDIOVASCULAR:  Regular rate and rhythm without murmur, rub or gallop. ABDOMEN:  Colostomy.  Soft, non-tender, with active bowel sounds, and no hepatosplenomegaly.  No masses. SKIN:  No rashes, ulcers or lesions. EXTREMITIES: No edema, no skin discoloration or tenderness.  No palpable  cords. LYMPH NODES: No palpable cervical, supraclavicular, axillary or inguinal adenopathy  NEUROLOGICAL: Unremarkable. PSYCH:  Appropriate.   Results for orders placed or performed during the hospital encounter of 04/12/16 (from the past 48 hour(s))  Albumin     Status: Abnormal   Collection Time: 04/12/16  2:19 PM  Result Value Ref Range   Albumin 1.8 (L) 3.5 - 5.0 g/dL  Phosphorus     Status: Abnormal   Collection Time: 04/12/16  2:19 PM  Result Value Ref Range   Phosphorus 5.9 (H) 2.5 - 4.6 mg/dL   No results found.  Assessment:  The patient is a 70 y.o.  woman with recurrent endometrial cancer admitted on day 8 s/p cycle #2 carboplatin and Taxol with acute renal insufficiency secondary to dehydration from poor oral inake, nausea/vomiting, and significant ostomy output.    She is neutropenic.  Plan:   1.  Oncology:  Day 8 s/p cycle #2 carboplatin and Taxol.  Neutropenic precautions.  No intervention at this time.  Anticipate initiation of Neulasta with next cycle of chemotherapy.  Follow-CBC daily.    2.  Renal:  Recurrent episodes of acute renal insufficiency secondary to decreased oral intake and increased stool output.  IVF continue.  Electrolyte replacement.  Patient on central telemetry for increased QT.  3.  Gastroenterology:  Stool output previously unaffected by [name] and Lomotil.  She has been successful in past with SQ octreotide (currenly being held secondary to increased QT).  She received sandostatin LAR on 04/05/2016.  Patient previously seen by Dr. Allen Norris.  He is planning a colonoscopy in the near future (when her counts recover).  Consideration of antibiotics for possible microscopic colitis.   Thank you for allowing me to participate in [name] 's care.  I will follow her  closely with you while hospitalized and after discharge in the outpatient department.  Lequita Asal, MD  04/12/2016, 9:56 PM

## 2016-04-13 ENCOUNTER — Inpatient Hospital Stay: Payer: Medicare Other

## 2016-04-13 ENCOUNTER — Inpatient Hospital Stay: Payer: Medicare Other | Admitting: Hematology and Oncology

## 2016-04-13 LAB — CBC
HCT: 31.4 % — ABNORMAL LOW (ref 35.0–47.0)
Hemoglobin: 10.7 g/dL — ABNORMAL LOW (ref 12.0–16.0)
MCH: 33 pg (ref 26.0–34.0)
MCHC: 34 g/dL (ref 32.0–36.0)
MCV: 97 fL (ref 80.0–100.0)
PLATELETS: 102 10*3/uL — AB (ref 150–440)
RBC: 3.23 MIL/uL — ABNORMAL LOW (ref 3.80–5.20)
RDW: 15.3 % — AB (ref 11.5–14.5)
WBC: 0.8 10*3/uL — AB (ref 3.6–11.0)

## 2016-04-13 LAB — BASIC METABOLIC PANEL
Anion gap: 20 — ABNORMAL HIGH (ref 5–15)
Anion gap: 11 (ref 5–15)
BUN: 86 mg/dL — ABNORMAL HIGH (ref 6–20)
BUN: 79 mg/dL — AB (ref 6–20)
CALCIUM: 5.9 mg/dL — AB (ref 8.9–10.3)
CO2: 21 mmol/L — ABNORMAL LOW (ref 22–32)
CO2: 18 mmol/L — ABNORMAL LOW (ref 22–32)
CREATININE: 3.11 mg/dL — AB (ref 0.44–1.00)
Calcium: 6.4 mg/dL — CL (ref 8.9–10.3)
Chloride: 93 mmol/L — ABNORMAL LOW (ref 101–111)
Chloride: 104 mmol/L (ref 101–111)
Creatinine, Ser: 4.35 mg/dL — ABNORMAL HIGH (ref 0.44–1.00)
GFR calc Af Amer: 11 mL/min — ABNORMAL LOW (ref >60)
GFR calc Af Amer: 17 mL/min — ABNORMAL LOW (ref >60)
GFR calc non Af Amer: 9 mL/min — ABNORMAL LOW (ref >60)
GFR, EST NON AFRICAN AMERICAN: 14 mL/min — AB (ref >60)
GLUCOSE: 118 mg/dL — AB (ref 65–99)
Glucose, Bld: 160 mg/dL — ABNORMAL HIGH (ref 65–99)
POTASSIUM: 3 mmol/L — AB (ref 3.5–5.1)
Potassium: 3.7 mmol/L (ref 3.5–5.1)
SODIUM: 133 mmol/L — AB (ref 135–145)
Sodium: 134 mmol/L — ABNORMAL LOW (ref 135–145)

## 2016-04-13 LAB — PATHOLOGIST SMEAR REVIEW

## 2016-04-13 LAB — CBC WITH DIFFERENTIAL/PLATELET
Basophils Absolute: 0 10*3/uL (ref 0–0.1)
Basophils Relative: 0 %
Eosinophils Absolute: 0 10*3/uL (ref 0–0.7)
Eosinophils Relative: 2 %
HCT: 36.6 % (ref 35.0–47.0)
Hemoglobin: 12.2 g/dL (ref 12.0–16.0)
Lymphocytes Relative: 49 %
Lymphs Abs: 0.4 10*3/uL — ABNORMAL LOW (ref 1.0–3.6)
MCH: 31.5 pg (ref 26.0–34.0)
MCHC: 33.2 g/dL (ref 32.0–36.0)
MCV: 95 fL (ref 80.0–100.0)
Monocytes Absolute: 0.3 10*3/uL (ref 0.2–0.9)
Monocytes Relative: 32 %
Neutro Abs: 0.2 10*3/uL — ABNORMAL LOW (ref 1.4–6.5)
Neutrophils Relative %: 17 %
Platelets: 192 10*3/uL (ref 150–440)
RBC: 3.86 MIL/uL (ref 3.80–5.20)
RDW: 15.3 % — ABNORMAL HIGH (ref 11.5–14.5)
WBC: 0.9 10*3/uL — CL (ref 3.6–11.0)

## 2016-04-13 LAB — MAGNESIUM: Magnesium: 2.4 mg/dL (ref 1.7–2.4)

## 2016-04-13 MED ORDER — BOOST / RESOURCE BREEZE PO LIQD
1.0000 | Freq: Three times a day (TID) | ORAL | Status: DC
  Administered 2016-04-14 – 2016-04-18 (×3): 1 via ORAL

## 2016-04-13 MED ORDER — CALCIUM CARBONATE ANTACID 500 MG PO CHEW
1.0000 | CHEWABLE_TABLET | Freq: Three times a day (TID) | ORAL | Status: DC
  Administered 2016-04-13 – 2016-04-19 (×15): 200 mg via ORAL
  Filled 2016-04-13 (×15): qty 1

## 2016-04-13 NOTE — Progress Notes (Signed)
[facility] Hematology/Oncology Progress Note  Date of admission: 04/12/2016  Hospital day:  04/13/16  Chief Complaint: [name] is a 70 y.o. female with recurrent endometrial cancer who was admitted on day 8 s/p cycle #2 carboplatin and Taxol with acute renal insufficiency.   Subjective:  Feels a little better.  Less nausea.  Liquid stool output continues.  Social History: The patient is alone today.    Allergies: No Known Allergies  Scheduled Medications: . calcium carbonate  1 tablet Oral TID WC  . feeding supplement  1 Container Oral TID BM  . heparin  5,000 Units Subcutaneous Q8H  . loperamide  2 mg Oral Q6H  . sodium chloride flush  3 mL Intravenous Q12H    Review of Systems: GENERAL: Feels a little better. No fevers, sweats or weight loss. PERFORMANCE STATUS (ECOG): 2 HEENT: No visual changes, runny nose, sore throat, mouth sores or tenderness. Lungs: No shortness of breath or cough. No hemoptysis. Cardiac: No chest pain, palpitations, orthopnea, or PND. GI: Nausea, improved. Liquid stool output from ostomy.  Denies abdominal pain. No vomiting, constipation, melena or hematochezia. GU: No urgency, frequency, dysuria, or hematuria. Musculoskeletal: No back pain. No joint pain. No muscle tenderness. Extremities: No pain or swelling. Skin: No rashes or skin changes. Neuro: General fatigue.  No headache, numbness or weakness, balance or coordination issues. Endocrine: No diabetes, thyroid issues, hot flashes or night sweats. Psych: No mood changes, depression or anxiety. Pain: No focal pain. Review of systems: All other systems reviewed and found to be negative.  Physical Exam: Blood pressure 116/65, pulse (!) 107, temperature 98 F (36.7 C), temperature source Oral, resp. rate 19, height 5' 2"  (1.575 m), weight 148 lb 4.8 oz (67.3 kg), SpO2 94 %.  GENERAL: Well developed, well nourished, fatigued woman lying comfortably  on the medical unit in no acute distress.  MENTAL STATUS: Alert and oriented to person, place and time. HEAD: Short gray hair. Normocephalic, atraumatic, face symmetric, no Cushingoid features. EYES: Blue eyes. Pupils equal round and reactive to light and accomodation. No conjunctivitis or scleral icterus. ENT: Oropharynx clear without lesion. Tongue normal. Mucous membranes moist.  RESPIRATORY: Clear to auscultation without rales, wheezes or rhonchi. CARDIOVASCULAR: Regular rate and rhythm without murmur, rub or gallop. ABDOMEN: Colostomy with liquid brown stool. Soft, non-tender, with active bowel sounds, and no hepatosplenomegaly. No masses. SKIN: No rashes, ulcers or lesions. EXTREMITIES: No edema, no skin discoloration or tenderness. No palpable cords. LYMPH NODES: No palpable cervical, supraclavicular, axillary or inguinal adenopathy  NEUROLOGICAL: Unremarkable. PSYCH: Appropriate.   Results for orders placed or performed during the hospital encounter of 04/12/16 (from the past 48 hour(s))  Albumin     Status: Abnormal   Collection Time: 04/12/16  2:19 PM  Result Value Ref Range   Albumin 1.8 (L) 3.5 - 5.0 g/dL  Phosphorus     Status: Abnormal   Collection Time: 04/12/16  2:19 PM  Result Value Ref Range   Phosphorus 5.9 (H) 2.5 - 4.6 mg/dL  Basic metabolic panel     Status: Abnormal   Collection Time: 04/13/16  5:00 AM  Result Value Ref Range   Sodium 133 (L) 135 - 145 mmol/L   Potassium 3.0 (L) 3.5 - 5.1 mmol/L   Chloride 104 101 - 111 mmol/L   CO2 18 (L) 22 - 32 mmol/L   Glucose, Bld 118 (H) 65 - 99 mg/dL   BUN 79 (H) 6 - 20 mg/dL   Creatinine,  Ser 3.11 (H) 0.44 - 1.00 mg/dL   Calcium 5.9 (LL) 8.9 - 10.3 mg/dL    Comment: CRITICAL RESULT CALLED TO, READ BACK BY AND VERIFIED WITH MARCELLA TURNER @ [phone] 04/13/16 BY [facility]    GFR calc non Af Amer 14 (L) >60 mL/min   GFR calc Af Amer 17 (L) >60 mL/min    Comment: (NOTE) The eGFR has been calculated using the  CKD EPI equation. This calculation has not been validated in all clinical situations. eGFR's persistently <60 mL/min signify possible Chronic Kidney Disease.    Anion gap 11 5 - 15  CBC     Status: Abnormal   Collection Time: 04/13/16  5:00 AM  Result Value Ref Range   WBC 0.8 (LL) 3.6 - 11.0 K/uL    Comment: CRITICAL RESULT CALLED TO, READ BACK BY AND VERIFIED WITH:  MARCELLA TURNER AT 0622 04/13/16 SDR    RBC 3.23 (L) 3.80 - 5.20 MIL/uL   Hemoglobin 10.7 (L) 12.0 - 16.0 g/dL   HCT 31.4 (L) 35.0 - 47.0 %   MCV 97.0 80.0 - 100.0 fL   MCH 33.0 26.0 - 34.0 pg   MCHC 34.0 32.0 - 36.0 g/dL   RDW 15.3 (H) 11.5 - 14.5 %   Platelets 102 (L) 150 - 440 K/uL  Magnesium     Status: None   Collection Time: 04/13/16  5:00 AM  Result Value Ref Range   Magnesium 2.4 1.7 - 2.4 mg/dL   No results found.  Assessment:  [name] is a 70 y.o. female with with recurrent endometrial cancer admitted on day 8 s/p cycle #2 carboplatin and Taxol secondary to acute renal insufficiency secondary to dehydration from poor oral inake, nausea/vomiting, and significant ostomy output. She is improving with IVF. Creatinine from 4.35 to 3.11. Potassium, magnesium, and calcium are being repleted.  Plan:  1. Oncology: Day 9 s/p cycle #2 carboplatin and Taxol. Less nausea this morning.  Neutropenic. Follow counts daily.  Ondansetron, Ativan, and Phenergan for nausea.  2. Renal: IVF continue.  Slight improvement in creatinine 4.35 to 3.11 overnight.  Bicarb decreased secondary to GI losses.  Magnesium improved.  Potassium being supplemented.  Appreciate nephrology consult.  3. Gastroenterology: Patient previously noted improvement in GI output with short acting octreotide injections every 8 hours. She received depot octreotide (sandostatin LAR 10 mg) on day of chemotherapy (04/05/2016).  Octreotide currently being held.  Dr. Allen Norris consulted.    4.  Infectious disease:  Neutropenic precautions.  If  patient becomes febrile, pan culture and begin broad spectrum antibiotics.   Lequita Asal, MD  04/13/2016, 8:59 PM

## 2016-04-13 NOTE — Progress Notes (Signed)
[facility] [place] NAME: [name]    MR#:  [id number]  DATE OF BIRTH:  May 25, 1946  SUBJECTIVE:  Feels a bit better than y'day Colostomy output increasing on and off since June  REVIEW OF SYSTEMS:   Review of Systems  Constitutional: Positive for malaise/fatigue. Negative for chills, fever and weight loss.  HENT: Negative for ear discharge, ear pain and nosebleeds.   Eyes: Negative for blurred vision, pain and discharge.  Respiratory: Negative for sputum production, shortness of breath, wheezing and stridor.   Cardiovascular: Negative for chest pain, palpitations, orthopnea and PND.  Gastrointestinal: Positive for diarrhea. Negative for abdominal pain, nausea and vomiting.  Genitourinary: Negative for frequency and urgency.  Musculoskeletal: Negative for back pain and joint pain.  Neurological: Positive for weakness. Negative for sensory change, speech change and focal weakness.  Psychiatric/Behavioral: Negative for depression and hallucinations. The patient is not nervous/anxious.   All other systems reviewed and are negative.  Tolerating Diet:yes Tolerating PT:   DRUG ALLERGIES:  No Known Allergies  VITALS:  Blood pressure (!) 82/50, pulse 95, temperature 98.4 F (36.9 C), resp. rate 16, height 5\' 2"  (1.575 m), weight 67.3 kg (148 lb 4.8 oz), SpO2 (!) 89 %.  PHYSICAL EXAMINATION:   Physical Exam  GENERAL:  70 y.o.-year-old patient lying in the bed with no acute distress.  EYES: Pupils equal, round, reactive to light and accommodation. No scleral icterus. Extraocular muscles intact.  HEENT: Head atraumatic, normocephalic. Oropharynx and nasopharynx clear.  NECK:  Supple, no jugular venous distention. No thyroid enlargement, no tenderness.  LUNGS: Normal breath sounds bilaterally, no wheezing, rales, rhonchi. No use of accessory muscles of respiration.  CARDIOVASCULAR: S1, S2 normal. No murmurs, rubs, or gallops.   ABDOMEN: Soft, nontender, nondistended. Bowel sounds present. No organomegaly or mass. Colostomy+ EXTREMITIES: No cyanosis, clubbing or edema b/l.    NEUROLOGIC: Cranial nerves II through XII are intact. No focal Motor or sensory deficits b/l.   PSYCHIATRIC:  patient is alert and oriented x 3.  SKIN: No obvious rash, lesion, or ulcer.   LABORATORY PANEL:  CBC  Recent Labs Lab 04/13/16 0500  WBC 0.8*  HGB 10.7*  HCT 31.4*  PLT 102*    Chemistries   Recent Labs Lab 04/13/16 0500  NA 133*  K 3.0*  CL 104  CO2 18*  GLUCOSE 118*  BUN 79*  CREATININE 3.11*  CALCIUM 5.9*  MG 2.4   Cardiac Enzymes No results for input(s): TROPONINI in the last 168 hours. RADIOLOGY:  No results found. ASSESSMENT AND PLAN:  * Acute kidney injury due to severe dehydration from diarrhea and decreased oral intake/ vomiting Replace IV fluids aggressively.  -We'll also replace magnesium and potassium. Monitor input and output. Consult with nephrology appreciated. Baseline creat 0.98 Came in with creat 4.35--3.11  * Chronic diarrhea worsened by chemotherapy Patient has received octreotide drip or shot one week back. At this time due to prolonged QT on EKG we will hold off on daily shots. Consult GI Dr. wall as he was planning on scheduling patient for a colonoscopy.  * Prolonged QT is likely due to hypomagnesemia. Replace magnesium. Monitor on telemetry.  * Endometrial carcinoma Patient follows with Dr. Mike Gip as outpatient  * Neutropenia - due to chemotherapy Neutropenic precautions.   * Hypocalcemia Corrected calcium 7.7 Getting tums daily  * DVT prophylaxis with heparin subcutaneous  Case discussed with Care Management/Social Worker. Management plans discussed with the patient, family  and they are in agreement.  CODE STATUS: full   TOTAL TIME TAKING CARE OF THIS PATIENT:30 minutes.  >50% time spent on counselling and coordination of care  POSSIBLE D/C IN 1-2 DAYS,  DEPENDING ON CLINICAL CONDITION.  Note: This dictation was prepared with Dragon dictation along with smaller phrase technology. Any transcriptional errors that result from this process are unintentional.  Sota Hetz M.D on 04/13/2016 at 3:38 PM  Between 7am to 6pm - Pager - [phone]  After 6pm go to [email or web address] - password [email or web address] [facility]  [facility]  Office  [phone]  CC: Primary care physician; [name], MD

## 2016-04-13 NOTE — Progress Notes (Signed)
Rapid response called. Resp in, nsg supervisor in, [facility] nurse in to see pt.

## 2016-04-13 NOTE — Consult Note (Signed)
Date: 04/13/2016                  Patient Name:  [name]  MRN: [id number]  DOB: 1946/01/22  Age / Sex: 70 y.o., female         PCP: [name], MD                 Service Requesting Consult: Internal medicine                 Reason for Consult: ARF            History of Present Illness: Patient is a 70 y.o. female with medical problems of Endometrial cancer undergoing chemotherapy, history of diverticulitis, colostomy, who was admitted to [facility] on 04/12/2016 for evaluation of increased diarrhea x 3 days, weakness, acute renal failure  Patient's baseline creatinine is 0.98 from August 3. On August 10, creatinine is noted to have increased to 4.35, BUN 86.  IV hydration, creatinine has improved to 3.11 Today, she denies any acute complaints No shortness of breath, no lower extremity edema She does report increased output from her colostomy Albumin is noted to be low at 1.8 Phosphorus is high at 5.9 Potassium is low at 3.0, calcium is low at 5.9   Medications: Outpatient medications: Prescriptions Prior to Admission  Medication Sig Dispense Refill Last Dose  . octreotide (SANDOSTATIN) 100 MCG/ML SOLN injection Inject 1 mL (100 mcg total) into the skin every 8 (eight) hours. 13 mL 0 04/12/2016 at 0600  . ondansetron (ZOFRAN) 8 MG tablet Take 8 mg by mouth 2 (two) times daily as needed.    prn at prn  . potassium chloride 20 MEQ TBCR Take 20 mEq by mouth 2 (two) times daily. 60 tablet 0 04/12/2016 at 0700  . promethazine (PHENERGAN) 25 MG tablet Take 1 tablet (25 mg total) by mouth every 8 (eight) hours as needed for nausea or vomiting. 30 tablet 0 prn at prn  . sodium bicarbonate 650 MG tablet Take 1 tablet (650 mg total) by mouth 2 (two) times daily. 60 tablet 6 04/12/2016 at 0700  . Syringe, Disposable, 1 ML MISC 1 Units by Does not apply route 3 (three) times daily. 25 each 0 Taking    Current medications: Current Facility-Administered Medications  Medication Dose  Route Frequency Provider Last Rate Last Dose  . 0.9 % NaCl with KCl 20 mEq/ L  infusion   Intravenous Continuous [name], MD 125 mL/hr at 04/13/16 0134    . acetaminophen (TYLENOL) tablet 650 mg  650 mg Oral Q6H PRN [name], MD       Or  . acetaminophen (TYLENOL) suppository 650 mg  650 mg Rectal Q6H PRN Srikar Sudini, MD      . albuterol (PROVENTIL) (2.5 MG/3ML) 0.083% nebulizer solution 2.5 mg  2.5 mg Nebulization Q2H PRN Srikar Sudini, MD      . calcium carbonate (TUMS - dosed in mg elemental calcium) chewable tablet 200 mg of elemental calcium  1 tablet Oral TID WC Harrie Foreman, MD   200 mg of elemental calcium at 04/13/16 0656  . diphenoxylate-atropine (LOMOTIL) 2.5-0.025 MG per tablet 1 tablet  1 tablet Oral QID PRN [name], MD      . heparin injection 5,000 Units  5,000 Units Subcutaneous Q8H [name], MD   5,000 Units at 04/13/16 0542  . loperamide ([name]) capsule 2 mg  2 mg Oral Q6H Srikar Sudini, MD   2 mg at  04/13/16 0545  . LORazepam (ATIVAN) tablet 0.5 mg  0.5 mg Oral Q8H PRN [name], MD   0.5 mg at 04/13/16 0024  . ondansetron (ZOFRAN) tablet 4 mg  4 mg Oral Q6H PRN [name], MD       Or  . ondansetron (ZOFRAN) injection 4 mg  4 mg Intravenous Q6H PRN Srikar Sudini, MD      . oxyCODONE (Oxy IR/ROXICODONE) immediate release tablet 5 mg  5 mg Oral Q4H PRN Srikar Sudini, MD      . promethazine (PHENERGAN) injection 12.5 mg  12.5 mg Intravenous Q6H PRN Srikar Sudini, MD      . sodium chloride flush (NS) 0.9 % injection 3 mL  3 mL Intravenous Q12H Srikar Sudini, MD          Allergies: No Known Allergies    Past Medical History: Past Medical History:  Diagnosis Date  . Cancer [facility]) 2011   endometrial  . Chronic kidney disease    STONES  . Diverticulitis of colon (without mention of hemorrhage)   . History of kidney stones 2011  . Mechanical complication of colostomy and enterostomy   . Obesity, unspecified   . Personal history of  tobacco use, presenting hazards to health      Past Surgical History: Past Surgical History:  Procedure Laterality Date  . ABDOMINAL HYSTERECTOMY  2011  . APPENDECTOMY    . CHOLECYSTECTOMY  05/07/14   Incidental during ventral hernia repair.   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 COLON SURGERY  05/20/1999   Drainage of pelvic abscess, end colostomy  . COLON SURGERY  11/15/2010   Revision of colostomy stoma  . COLONOSCOPY  June 2010,03/31/14   Tubular adenoma of the transverse colon, hyperplastic polyps of the sigmoid.  Marland Kitchen COLOSTOMY  2011  . DILATION AND CURETTAGE OF UTERUS    . HERNIA REPAIR  04/06/14   ventral hernia, tetro rectus Ventralex ST mesh, 20 x 33 cm.   Marland Kitchen HYSTEROSCOPY  2011  . LITHOTRIPSY  2009  . MOLE REMOVAL  2003  . PORTACATH PLACEMENT N/A 03/01/2016   Procedure: INSERTION PORT-A-CATH;  Surgeon: [name], MD;  Location: [facility] ORS;  Service: General;  Laterality: N/A;  . PYELOLITHOTOMY    . skin cancer removal  02/2014  . TUBAL LIGATION       Family History: Family History  Problem Relation Age of Onset  . Breast cancer Maternal Grandmother   . Colon cancer Mother   . Cancer Mother      Social History: Social History   Social History  . Marital status: Married    Spouse name: N/A  . Number of children: N/A  . Years of education: N/A   Occupational History  . Not on file.   Social History Main Topics  . Smoking status: Current Every Day Smoker    Packs/day: 0.25    Years: 15.00    Types: Cigarettes  . Smokeless tobacco: Never Used  . Alcohol use No  . Drug use: No  . Sexual activity: Not on file   Other Topics Concern  . Not on file   Social History Narrative  . No narrative on file     Review of Systems: Gen: No fevers, chills HEENT: No vision changes,  no hearing problems CV: No chest pain or shortness of breath Resp: No bleeding troubles, no  cough GI: Increased output  from colostomy, no nausea or vomiting GU : Decreased frequency of voiding noted at home, normalizing now MS: Denies acute complaints Derm:  No acute complaints Psych: No acute complaints Heme: No acute complaints Neuro: No complaints Endocrine: No complaints  Vital Signs: Blood pressure (!) 91/57, pulse 92, temperature 97.6 F (36.4 C), temperature source Oral, resp. rate 18, height 5\' 2"  (1.575 m), weight 67.3 kg (148 lb 4.8 oz), SpO2 95 %.   Intake/Output Summary (Last 24 hours) at 04/13/16 1025 Last data filed at 04/13/16 0806  Gross per 24 hour  Intake             2217 ml  Output             2700 ml  Net             -483 ml    Weight trends: Filed Weights   04/12/16 1107 04/13/16 0500  Weight: 72.6 kg (160 lb) 67.3 kg (148 lb 4.8 oz)    Physical Exam: General:  No acute distress, lying in the bed   HEENT Anicteric, moist oral mucous membranes   Neck:  Supple   Lungs: Normal breathing effort   Heart::  No rub or gallop   Abdomen: Soft, nontender, colostomy present   Extremities:  No peripheral edema   Neurologic: Alert, oriented   Skin: Decreased turgor, no acute rashes              Lab results: Basic Metabolic Panel:  Recent Labs Lab 04/12/16 0843 04/12/16 1419 04/13/16 0500  NA 134*  --  133*  K 3.7  --  3.0*  CL 93*  --  104  CO2 21*  --  18*  GLUCOSE 160*  --  118*  BUN 86*  --  79*  CREATININE 4.35*  --  3.11*  CALCIUM 6.4*  --  5.9*  MG 1.4*  --  2.4  PHOS  --  5.9*  --     Liver Function Tests:  Recent Labs Lab 04/12/16 1419  ALBUMIN 1.8*   No results for input(s): LIPASE, AMYLASE in the last 168 hours. No results for input(s): AMMONIA in the last 168 hours.  CBC:  Recent Labs Lab 04/12/16 0843 04/13/16 0500  WBC 0.9* 0.8*  NEUTROABS 0.2*  --   HGB 12.2 10.7*  HCT 36.6 31.4*  MCV 95.0 97.0  PLT 192 102*    Cardiac Enzymes: No results for input(s): CKTOTAL, TROPONINI in the last 168  hours.  BNP: Invalid input(s): POCBNP  CBG: No results for input(s): GLUCAP in the last 168 hours.  Microbiology: Recent Results (from the past 720 hour(s))  Culture, blood (Routine X 2) w Reflex to ID Panel     Status: None   Collection Time: 03/18/16 10:44 AM  Result Value Ref Range Status   Specimen Description BLOOD RIGHT ARM  Final   Special Requests BOTTLES DRAWN AEROBIC AND ANAEROBIC  8CC  Final   Culture NO GROWTH 5 DAYS  Final   Report Status 03/23/2016 FINAL  Final  Culture, blood (Routine X 2) w Reflex to ID Panel     Status: None   Collection Time: 03/18/16 10:44 AM  Result Value Ref Range Status   Specimen Description BLOOD RIGHT PORTA CATH CHEST  Final   Special Requests BOTTLES DRAWN AEROBIC AND ANAEROBIC  [place]  Final   Culture NO GROWTH 5 DAYS  Final   Report Status 03/23/2016 FINAL  Final  Urine culture  Status: Abnormal   Collection Time: 03/18/16 10:44 AM  Result Value Ref Range Status   Specimen Description URINE, RANDOM  Final   Special Requests NONE  Final   Culture MULTIPLE SPECIES PRESENT, SUGGEST RECOLLECTION (A)  Final   Report Status 03/20/2016 FINAL  Final  C difficile quick scan w PCR reflex     Status: None   Collection Time: 03/25/16  4:53 PM  Result Value Ref Range Status   C Diff antigen NEGATIVE NEGATIVE Final   C Diff toxin NEGATIVE NEGATIVE Final   C Diff interpretation No C. difficile detected.  Final  Urine culture     Status: Abnormal   Collection Time: 03/26/16  2:28 PM  Result Value Ref Range Status   Specimen Description URINE, CATHETERIZED  Final   Special Requests NONE  Final   Culture MULTIPLE SPECIES PRESENT, SUGGEST RECOLLECTION (A)  Final   Report Status 03/27/2016 FINAL  Final  Gastrointestinal Panel by PCR , Stool     Status: None   Collection Time: 03/26/16  4:47 PM  Result Value Ref Range Status   Campylobacter species NOT DETECTED NOT DETECTED Final   Plesimonas shigelloides NOT DETECTED NOT DETECTED Final    Salmonella species NOT DETECTED NOT DETECTED Final   Yersinia enterocolitica NOT DETECTED NOT DETECTED Final   Vibrio species NOT DETECTED NOT DETECTED Final   Vibrio cholerae NOT DETECTED NOT DETECTED Final   Enteroaggregative E coli (EAEC) NOT DETECTED NOT DETECTED Final   Enteropathogenic E coli (EPEC) NOT DETECTED NOT DETECTED Final   Enterotoxigenic E coli (ETEC) NOT DETECTED NOT DETECTED Final   Shiga like toxin producing E coli (STEC) NOT DETECTED NOT DETECTED Final   E. coli O157 NOT DETECTED NOT DETECTED Final   Shigella/Enteroinvasive E coli (EIEC) NOT DETECTED NOT DETECTED Final   Cryptosporidium NOT DETECTED NOT DETECTED Final   Cyclospora cayetanensis NOT DETECTED NOT DETECTED Final   Entamoeba histolytica NOT DETECTED NOT DETECTED Final   Giardia lamblia NOT DETECTED NOT DETECTED Final   Adenovirus F40/41 NOT DETECTED NOT DETECTED Final   Astrovirus NOT DETECTED NOT DETECTED Final   Norovirus GI/GII NOT DETECTED NOT DETECTED Final   Rotavirus A NOT DETECTED NOT DETECTED Final   Sapovirus (I, II, IV, and V) NOT DETECTED NOT DETECTED Final  Urine culture     Status: None   Collection Time: 03/28/16  8:52 AM  Result Value Ref Range Status   Specimen Description URINE, CATHETERIZED  Final   Special Requests NONE  Final   Culture NO GROWTH Performed at [facility]   Final   Report Status 03/29/2016 FINAL  Final     Coagulation Studies: No results for input(s): LABPROT, INR in the last 72 hours.  Urinalysis: No results for input(s): COLORURINE, LABSPEC, PHURINE, GLUCOSEU, HGBUR, BILIRUBINUR, KETONESUR, PROTEINUR, UROBILINOGEN, NITRITE, LEUKOCYTESUR in the last 72 hours.  Invalid input(s): APPERANCEUR      Imaging:  No results found.   Assessment & Plan: Pt is a 70 y.o. yo female with medical problems of Endometrial cancer undergoing chemotherapy, history of diverticulitis, colostomy, who was admitted to [facility] on 04/12/2016 for evaluation of increased  diarrhea x 3 days, weakness, acute renal failure  1. Acute renal failure, nonoliguric Likely secondary to severe dehydration from increased colostomy output Baseline serum creatinine is 0.98 from August 3 Admission creatinine 4.35 Today's creatinine has improved to 3.11, BUN improved to 79 - Agree with IV fluid hydration - GI workup in progress to find out  cause of increased colostomy output  2. Hypokalemia Likely secondary to nutritional deficiencies as well as increased urine output Agree with IV supplementation  3. Acidosis Likely from increased diarrhea  4. Hypocalcemia Corrected calcium 7.7 Currently getting supplementation with Tums orally

## 2016-04-13 NOTE — Progress Notes (Signed)
Initial Nutrition Assessment  DOCUMENTATION CODES:   Not applicable  INTERVENTION:  -Pt agreeable to snacks between meals, likes pudding. Encourage smaller, more frequent meals -Recommend Boost Breeze po TID, each supplement provides 250 kcal and 9 grams of protein   NUTRITION DIAGNOSIS:   Inadequate oral intake related to altered GI function, poor appetite as evidenced by per patient/family report.  GOAL:   Patient will meet greater than or equal to 90% of their needs  MONITOR:   PO intake, Supplement acceptance, Labs, Weight trends  REASON FOR ASSESSMENT:   Malnutrition Screening Tool    ASSESSMENT:   [name]  is a 70 y.o. female with a known history of Chronic diarrhea status post colostomy, endometrial carcinoma with recent chemotherapy presents to the emergency room from cancer center due to acute kidney injury with creatinine of 4.3. Patient had similar admissions recently twice causing dehydration from chronic diarrhea worsened by chemotherapy along with vomiting and decreased oral intake.  Pt very flat, minimally conversive on visit today. Pt reports fair appetite, eating 2 meals per day at home, snacks in between. Does not use nutritional supplements.  Pt reports she has lost weight but does not know how much  Past Medical History:  Diagnosis Date  . Cancer [facility]) 2011   endometrial  . Chronic kidney disease    STONES  . Diverticulitis of colon (without mention of hemorrhage)   . History of kidney stones 2011  . Mechanical complication of colostomy and enterostomy   . Obesity, unspecified   . Personal history of tobacco use, presenting hazards to health    Diet Order:  Diet regular Room service appropriate? Yes; Fluid consistency: Thin   Energy Intake: recorded po intake 100% at breakfast this AM pt reports tolerating meal trays without vomiting  Skin:  Reviewed, no issues  Last BM:  8/11 via colostomy   Labs: sodium 133, potassium 3.0,  Creatinine 3.11, BUN 79, corrected calcium 7.7 (albumin 1.8)  Meds:  NS with  KCl at 125 ml/hr, calcium carbonate  Height:   Ht Readings from Last 1 Encounters:  04/12/16 5\' 2"  (1.575 m)    Weight:   Wt Readings from Last 1 Encounters:  04/13/16 148 lb 4.8 oz (67.3 kg)   BMI:  Body mass index is 27.12 kg/m.  Estimated Nutritional Needs:   Kcal:  2010-2345 kcals  Protein:  100-115 g   Fluid:  >/= 2 L  EDUCATION NEEDS:   No education needs identified at this time  [name], [name], LDN [phone] Pager  [phone] Weekend/On-Call Pager

## 2016-04-13 NOTE — Progress Notes (Signed)
Dr. Willis in to see pt.

## 2016-04-13 NOTE — Progress Notes (Addendum)
Dr Jannifer Franklin notified of low bp and increase heart rate, noted after getting up to [facility] to void and having dry heaves. See new orders

## 2016-04-13 NOTE — Progress Notes (Signed)
Dr. Marcille Blanco notified of critical WBC 0.8 and Calcium 5.9; acknowledged; new order written. Barbaraann Faster, RN  6:30 AM 04/13/2016

## 2016-04-14 LAB — CBC WITH DIFFERENTIAL/PLATELET
Basophils Absolute: 0 10*3/uL (ref 0–0.1)
Basophils Relative: 0 %
Eosinophils Absolute: 0 10*3/uL (ref 0–0.7)
Eosinophils Relative: 1 %
HCT: 28.9 % — ABNORMAL LOW (ref 35.0–47.0)
Hemoglobin: 9.7 g/dL — ABNORMAL LOW (ref 12.0–16.0)
Lymphocytes Relative: 25 %
Lymphs Abs: 0.3 10*3/uL — ABNORMAL LOW (ref 1.0–3.6)
MCH: 32.8 pg (ref 26.0–34.0)
MCHC: 33.7 g/dL (ref 32.0–36.0)
MCV: 97.4 fL (ref 80.0–100.0)
Monocytes Absolute: 0.2 10*3/uL (ref 0.2–0.9)
Monocytes Relative: 22 %
Neutro Abs: 0.5 10*3/uL — ABNORMAL LOW (ref 1.4–6.5)
Neutrophils Relative %: 52 %
Platelets: 61 10*3/uL — ABNORMAL LOW (ref 150–440)
RBC: 2.96 MIL/uL — ABNORMAL LOW (ref 3.80–5.20)
RDW: 15.4 % — ABNORMAL HIGH (ref 11.5–14.5)
WBC: 1 10*3/uL — CL (ref 3.6–11.0)

## 2016-04-14 LAB — BASIC METABOLIC PANEL
ANION GAP: 8 (ref 5–15)
BUN: 72 mg/dL — ABNORMAL HIGH (ref 6–20)
CALCIUM: 6.3 mg/dL — AB (ref 8.9–10.3)
CO2: 14 mmol/L — AB (ref 22–32)
Chloride: 112 mmol/L — ABNORMAL HIGH (ref 101–111)
Creatinine, Ser: 2.43 mg/dL — ABNORMAL HIGH (ref 0.44–1.00)
GFR calc non Af Amer: 19 mL/min — ABNORMAL LOW (ref >60)
GFR, EST AFRICAN AMERICAN: 22 mL/min — AB (ref >60)
Glucose, Bld: 84 mg/dL (ref 65–99)
Potassium: 2.8 mmol/L — ABNORMAL LOW (ref 3.5–5.1)
Sodium: 134 mmol/L — ABNORMAL LOW (ref 135–145)

## 2016-04-14 LAB — MAGNESIUM: MAGNESIUM: 1.8 mg/dL (ref 1.7–2.4)

## 2016-04-14 LAB — POTASSIUM: POTASSIUM: 3.2 mmol/L — AB (ref 3.5–5.1)

## 2016-04-14 MED ORDER — METOPROLOL TARTRATE 5 MG/5ML IV SOLN
5.0000 mg | Freq: Once | INTRAVENOUS | Status: AC
  Administered 2016-04-14: 5 mg via INTRAVENOUS

## 2016-04-14 MED ORDER — POTASSIUM CHLORIDE 10 MEQ/100ML IV SOLN
10.0000 meq | INTRAVENOUS | Status: AC
  Administered 2016-04-14 (×2): 10 meq via INTRAVENOUS
  Filled 2016-04-14 (×2): qty 100

## 2016-04-14 MED ORDER — SOD CITRATE-CITRIC ACID 500-334 MG/5ML PO SOLN
30.0000 mL | Freq: Two times a day (BID) | ORAL | Status: DC
  Administered 2016-04-14: 30 mL via ORAL
  Filled 2016-04-14 (×5): qty 30

## 2016-04-14 MED ORDER — METOPROLOL TARTRATE 5 MG/5ML IV SOLN
INTRAVENOUS | Status: AC
  Filled 2016-04-14: qty 5

## 2016-04-14 MED ORDER — SODIUM CHLORIDE 0.9 % IV BOLUS (SEPSIS)
1000.0000 mL | Freq: Once | INTRAVENOUS | Status: AC
Start: 2016-04-14 — End: 2016-04-14
  Administered 2016-04-13: 1000 mL via INTRAVENOUS

## 2016-04-14 MED ORDER — POTASSIUM CHLORIDE 10 MEQ/100ML IV SOLN
10.0000 meq | INTRAVENOUS | Status: AC
Start: 2016-04-14 — End: 2016-04-14
  Administered 2016-04-14 (×4): 10 meq via INTRAVENOUS
  Filled 2016-04-14 (×4): qty 100

## 2016-04-14 MED ORDER — POTASSIUM CHLORIDE CRYS ER 20 MEQ PO TBCR: 20.0000 meq | EXTENDED_RELEASE_TABLET | ORAL | Status: DC

## 2016-04-14 NOTE — Progress Notes (Signed)
MEDICATION RELATED CONSULT NOTE - INITIAL   Pharmacy Consult for electrolyte monitoring/replacement Indication: hypokalemia  No Known Allergies  Patient Measurements: Height: 5\' 2"  (157.5 cm) Weight: 150 lb 12.8 oz (68.4 kg) IBW/kg (Calculated) : 50.1  Vital Signs: Temp: 97.7 F (36.5 C) (08/12 2055) Temp Source: Oral (08/12 2055) BP: 102/66 (08/12 2055) Pulse Rate: 120 (08/12 2055) Intake/Output from previous day: 08/11 0701 - 08/12 0700 In: 3316.6 [P.O.:480; I.V.:2836.6] Out: 3200 [Urine:850; Stool:2350] Intake/Output from this shift: Total I/O In: -  Out: [place]:  Recent Labs  04/12/16 0843 04/12/16 1419 04/13/16 0500 04/14/16 0414  WBC 0.9*  --  0.8* 1.0*  HGB 12.2  --  10.7* 9.7*  HCT 36.6  --  31.4* 28.9*  PLT 192  --  102* 61*  CREATININE 4.35*  --  3.11* 2.43*  MG 1.4*  --  2.4 1.8  PHOS  --  5.9*  --   --   ALBUMIN  --  1.8*  --   --    Estimated Creatinine Clearance: 19.8 mL/min (by C-G formula based on SCr of 2.43 mg/dL).  Assessment: Pharmacy consulted to monitor and replace electrolytes in this 70 year old female with hypokalemia.   K = 3.2 after repletion  Goal of Therapy:  Electrolytes within normal limits  Plan:  Patient has fluids containing KCL running at a rate of 125 mL/hr Will order KCl 20 mEq PO every 2 hours x 2 doses Recheck electrolytes with AM labs tomorrow  Lenis Noon, PharmD Clinical Pharmacist 04/14/2016,8:59 PM

## 2016-04-14 NOTE — Progress Notes (Signed)
Subjective:   Doing better today No N/V No SOB S Cr improved Potassium is low  Objective:  Vital signs in last 24 hours:  Temp:  [98 F (36.7 C)-98.4 F (36.9 C)] 98 F (36.7 C) (08/12 0533) Pulse Rate:  [87-190] 87 (08/12 0533) Resp:  [16-19] 19 (08/11 2029) BP: (74-116)/(49-91) 95/49 (08/12 0533) SpO2:  [86 %-100 %] 98 % (08/12 0533) Weight:  [68.4 kg (150 lb 12.8 oz)] 68.4 kg (150 lb 12.8 oz) (08/12 0500)  Weight change: -4.173 kg (-9 lb 3.2 oz) Filed Weights   04/12/16 1107 04/13/16 0500 04/14/16 0500  Weight: 72.6 kg (160 lb) 67.3 kg (148 lb 4.8 oz) 68.4 kg (150 lb 12.8 oz)    Intake/Output:    Intake/Output Summary (Last 24 hours) at 04/14/16 [id number] Last data filed at 04/14/16 0428  Gross per 24 hour  Intake          2764.08 ml  Output             2450 ml  Net           314.08 ml     Physical Exam: General: No acute distress, laying in the bed   HEENT Moist oral mucous membranes   Neck Supple   Pulm/lungs Normal breathing effort, mild diffuse wheezing upon auscultation   CVS/Heart No rub or gallop   Abdomen:  Soft, nontender, nondistended, colostomy in place   Extremities: No peripheral edema   Neurologic: Alert, oriented   Skin: Scaly lesions over bilateral legs           Basic Metabolic Panel:   Recent Labs Lab 04/12/16 0843 04/12/16 1419 04/13/16 0500 04/14/16 0414  NA 134*  --  133* 134*  K 3.7  --  3.0* 2.8*  CL 93*  --  104 112*  CO2 21*  --  18* 14*  GLUCOSE 160*  --  118* 84  BUN 86*  --  79* 72*  CREATININE 4.35*  --  3.11* 2.43*  CALCIUM 6.4*  --  5.9* 6.3*  MG 1.4*  --  2.4  --   PHOS  --  5.9*  --   --      CBC:  Recent Labs Lab 04/12/16 0843 04/13/16 0500 04/14/16 0414  WBC 0.9* 0.8* 1.0*  NEUTROABS 0.2*  --  0.5*  HGB 12.2 10.7* 9.7*  HCT 36.6 31.4* 28.9*  MCV 95.0 97.0 97.4  PLT 192 102* 61*      Microbiology:  Recent Results (from the past 720 hour(s))  Culture, blood (Routine X 2) w Reflex to ID Panel      Status: None   Collection Time: 03/18/16 10:44 AM  Result Value Ref Range Status   Specimen Description BLOOD RIGHT ARM  Final   Special Requests BOTTLES DRAWN AEROBIC AND ANAEROBIC  8CC  Final   Culture NO GROWTH 5 DAYS  Final   Report Status 03/23/2016 FINAL  Final  Culture, blood (Routine X 2) w Reflex to ID Panel     Status: None   Collection Time: 03/18/16 10:44 AM  Result Value Ref Range Status   Specimen Description BLOOD RIGHT PORTA CATH CHEST  Final   Special Requests BOTTLES DRAWN AEROBIC AND ANAEROBIC  [facility]  Final   Culture NO GROWTH 5 DAYS  Final   Report Status 03/23/2016 FINAL  Final  Urine culture     Status: Abnormal   Collection Time: 03/18/16 10:44 AM  Result Value Ref Range Status  Specimen Description URINE, RANDOM  Final   Special Requests NONE  Final   Culture MULTIPLE SPECIES PRESENT, SUGGEST RECOLLECTION (A)  Final   Report Status 03/20/2016 FINAL  Final  C difficile quick scan w PCR reflex     Status: None   Collection Time: 03/25/16  4:53 PM  Result Value Ref Range Status   C Diff antigen NEGATIVE NEGATIVE Final   C Diff toxin NEGATIVE NEGATIVE Final   C Diff interpretation No C. difficile detected.  Final  Urine culture     Status: Abnormal   Collection Time: 03/26/16  2:28 PM  Result Value Ref Range Status   Specimen Description URINE, CATHETERIZED  Final   Special Requests NONE  Final   Culture MULTIPLE SPECIES PRESENT, SUGGEST RECOLLECTION (A)  Final   Report Status 03/27/2016 FINAL  Final  Gastrointestinal Panel by PCR , Stool     Status: None   Collection Time: 03/26/16  4:47 PM  Result Value Ref Range Status   Campylobacter species NOT DETECTED NOT DETECTED Final   Plesimonas shigelloides NOT DETECTED NOT DETECTED Final   Salmonella species NOT DETECTED NOT DETECTED Final   Yersinia enterocolitica NOT DETECTED NOT DETECTED Final   Vibrio species NOT DETECTED NOT DETECTED Final   Vibrio cholerae NOT DETECTED NOT DETECTED Final    Enteroaggregative E coli (EAEC) NOT DETECTED NOT DETECTED Final   Enteropathogenic E coli (EPEC) NOT DETECTED NOT DETECTED Final   Enterotoxigenic E coli (ETEC) NOT DETECTED NOT DETECTED Final   Shiga like toxin producing E coli (STEC) NOT DETECTED NOT DETECTED Final   E. coli O157 NOT DETECTED NOT DETECTED Final   Shigella/Enteroinvasive E coli (EIEC) NOT DETECTED NOT DETECTED Final   Cryptosporidium NOT DETECTED NOT DETECTED Final   Cyclospora cayetanensis NOT DETECTED NOT DETECTED Final   Entamoeba histolytica NOT DETECTED NOT DETECTED Final   Giardia lamblia NOT DETECTED NOT DETECTED Final   Adenovirus F40/41 NOT DETECTED NOT DETECTED Final   Astrovirus NOT DETECTED NOT DETECTED Final   Norovirus GI/GII NOT DETECTED NOT DETECTED Final   Rotavirus A NOT DETECTED NOT DETECTED Final   Sapovirus (I, II, IV, and V) NOT DETECTED NOT DETECTED Final  Urine culture     Status: None   Collection Time: 03/28/16  8:52 AM  Result Value Ref Range Status   Specimen Description URINE, CATHETERIZED  Final   Special Requests NONE  Final   Culture NO GROWTH Performed at [facility]   Final   Report Status 03/29/2016 FINAL  Final    Coagulation Studies: No results for input(s): LABPROT, INR in the last 72 hours.  Urinalysis: No results for input(s): COLORURINE, LABSPEC, PHURINE, GLUCOSEU, HGBUR, BILIRUBINUR, KETONESUR, PROTEINUR, UROBILINOGEN, NITRITE, LEUKOCYTESUR in the last 72 hours.  Invalid input(s): APPERANCEUR    Imaging: No results found.   Medications:   . 0.9 % NaCl with KCl 20 mEq / L 125 mL/hr at 04/14/16 0328   . calcium carbonate  1 tablet Oral TID WC  . feeding supplement  1 Container Oral TID BM  . heparin  5,000 Units Subcutaneous Q8H  . loperamide  2 mg Oral Q6H  . metoprolol      . sodium chloride flush  3 mL Intravenous Q12H   acetaminophen **OR** acetaminophen, albuterol, diphenoxylate-atropine, LORazepam, ondansetron **OR** ondansetron (ZOFRAN) IV,  oxyCODONE, promethazine  Assessment/ Plan:  70 y.o. female with medical problems of Endometrial cancer undergoing chemotherapy, history of diverticulitis, colostomy, who was admitted to [facility] on  04/12/2016 for evaluation of increased diarrhea x 3 days, weakness, acute renal failure  1. Acute renal failure, nonoliguric Likely secondary to severe dehydration from increased colostomy output Baseline serum creatinine is 0.98 from August 3 Admission creatinine 4.35 Today's creatinine has improved to 2.43, BUN improved to 72 - Agree with IV fluid hydration - GI workup in progress to find out cause of increased colostomy output  2. Hypokalemia Likely secondary to nutritional deficiencies as well as increased urine output Agree with IV supplementation  3. Acidosis Likely from increased diarrhea Start oral supplementation  4. Hypocalcemia Corrected calcium 7.7 Currently getting supplementation with Tums orally   LOS: 2 Carlus Stay 8/12/20179:23 AM

## 2016-04-14 NOTE — Progress Notes (Signed)
MEDICATION RELATED CONSULT NOTE - INITIAL   Pharmacy Consult for electrolyte monitoring/replacement Indication: hypokalemia  No Known Allergies  Patient Measurements: Height: 5\' 2"  (157.5 cm) Weight: 150 lb 12.8 oz (68.4 kg) IBW/kg (Calculated) : 50.1  Vital Signs: Temp: 97.7 F (36.5 C) (08/12 1223) Temp Source: Oral (08/12 0533) BP: 82/50 (08/12 1223) Pulse Rate: 86 (08/12 1223) Intake/Output from previous day: 08/11 0701 - 08/12 0700 In: 3316.6 [P.O.:480; I.V.:2836.6] Out: 3200 [Urine:850; Stool:2350] Intake/Output from this shift: Total I/O In: 240 [P.O.:240] Out: 450 [Urine:150; Stool:300]  Labs:  Recent Labs  04/12/16 0843 04/12/16 1419 04/13/16 0500 04/14/16 0414  WBC 0.9*  --  0.8* 1.0*  HGB 12.2  --  10.7* 9.7*  HCT 36.6  --  31.4* 28.9*  PLT 192  --  102* 61*  CREATININE 4.35*  --  3.11* 2.43*  MG 1.4*  --  2.4  --   PHOS  --  5.9*  --   --   ALBUMIN  --  1.8*  --   --    Estimated Creatinine Clearance: 19.8 mL/min (by C-G formula based on SCr of 2.43 mg/dL).  Assessment: Pharmacy consulted to monitor and replace electrolytes in this 70 year old female with hypokalemia.   K= 2.8 Mg add-on ordered  Goal of Therapy:  Electrolytes within normal limits  Plan:  Patient has fluids containing KCL running at a rate of 125 mL/hr Will order 4 runs of IV potassium and recheck K this evening  Lenis Noon, PharmD Clinical Pharmacist 04/14/2016,1:16 PM

## 2016-04-14 NOTE — Progress Notes (Signed)
Performed EKG during Rapid Response

## 2016-04-14 NOTE — Progress Notes (Signed)
[facility] [place] NAME: [name]    MR#:  [id number]  DATE OF BIRTH:  08-Dec-1945  SUBJECTIVE:  Feels a bit better than y'day Colostomy output increasing on and off since June Rapid response called due to tachycardia and hypotension. Better now  REVIEW OF SYSTEMS:   Review of Systems  Constitutional: Positive for malaise/fatigue. Negative for chills, fever and weight loss.  HENT: Negative for ear discharge, ear pain and nosebleeds.   Eyes: Negative for blurred vision, pain and discharge.  Respiratory: Negative for sputum production, shortness of breath, wheezing and stridor.   Cardiovascular: Negative for chest pain, palpitations, orthopnea and PND.  Gastrointestinal: Positive for diarrhea. Negative for abdominal pain, nausea and vomiting.  Genitourinary: Negative for frequency and urgency.  Musculoskeletal: Negative for back pain and joint pain.  Neurological: Positive for weakness. Negative for sensory change, speech change and focal weakness.  Psychiatric/Behavioral: Negative for depression and hallucinations. The patient is not nervous/anxious.   All other systems reviewed and are negative.  Tolerating Diet:yes Tolerating [id number]   DRUG ALLERGIES:  No Known Allergies  VITALS:  Blood pressure (!) 95/49, pulse 87, temperature 98 F (36.7 C), temperature source Oral, resp. rate 19, height 5\' 2"  (1.575 m), weight 68.4 kg (150 lb 12.8 oz), SpO2 98 %.  PHYSICAL EXAMINATION:   Physical Exam  GENERAL:  70 y.o.-year-old patient lying in the bed with no acute distress.  EYES: Pupils equal, round, reactive to light and accommodation. No scleral icterus. Extraocular muscles intact.  HEENT: Head atraumatic, normocephalic. Oropharynx and nasopharynx clear.  NECK:  Supple, no jugular venous distention. No thyroid enlargement, no tenderness.  LUNGS: Normal breath sounds bilaterally, no wheezing, rales, rhonchi. No use of  accessory muscles of respiration.  CARDIOVASCULAR: S1, S2 normal. No murmurs, rubs, or gallops.  ABDOMEN: Soft, nontender, nondistended. Bowel sounds present. No organomegaly or mass. Colostomy+ EXTREMITIES: No cyanosis, clubbing or edema b/l.    NEUROLOGIC: Cranial nerves II through XII are intact. No focal Motor or sensory deficits b/l.   PSYCHIATRIC:  patient is alert and oriented x 3.  SKIN: No obvious rash, lesion, or ulcer.   LABORATORY PANEL:  CBC  Recent Labs Lab 04/14/16 0414  WBC 1.0*  HGB 9.7*  HCT 28.9*  PLT 61*    Chemistries   Recent Labs Lab 04/13/16 0500  NA 133*  K 3.0*  CL 104  CO2 18*  GLUCOSE 118*  BUN 79*  CREATININE 3.11*  CALCIUM 5.9*  MG 2.4   Cardiac Enzymes No results for input(s): TROPONINI in the last 168 hours. RADIOLOGY:  No results found. ASSESSMENT AND PLAN:  * Acute kidney injury due to severe dehydration from diarrhea and decreased oral intake/ vomiting Replace IV fluids aggressively.  -replete electrolytes Consult with nephrology appreciated. 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 At this time due to prolonged QT on EKG we will hold off on daily shots. Consult GI Dr. wall as he was planning on scheduling patient for a colonoscopy. -EKG this am  * Prolonged QT is likely due to hypomagnesemia. Replaced magnesium.  -Had run of SVT last nite-resolved  * Endometrial carcinoma Patient follows with Dr. Mike Berger as outpatient  * Neutropenia - due to chemotherapy Neutropenic precautions.   * Hypocalcemia Corrected calcium 7.7 Getting tums daily  * DVT prophylaxis with heparin subcutaneous  Case discussed with Care Management/Social Worker. Management plans discussed with the patient, family and they are in agreement.  CODE STATUS: full   TOTAL TIME TAKING CARE OF THIS PATIENT:30  minutes.  >50% time spent on counselling and coordination of care  POSSIBLE D/C IN 1-2 DAYS, DEPENDING ON CLINICAL CONDITION.  Note: This dictation was prepared with Dragon dictation along with smaller phrase technology. Any transcriptional errors that result from this process are unintentional.  [name] M.D on 04/14/2016 at 7:45 AM  Between 7am to 6pm - Pager - [phone]  After 6pm go to [email or web address] - password [email or web address] [facility]  [facility]  Office  [phone]  CC: Primary care physician; Heather Miu, MD

## 2016-04-14 NOTE — Care Management Important Message (Signed)
Important Message  Patient Details  Name: ANATALIA OTEY MRN: [id number] Date of Birth: 06-Sep-1945   Medicare Important Message Given:  Yes    Demiya Magno A, RN 04/14/2016, 4:11 PM

## 2016-04-14 NOTE — Consult Note (Signed)
Consultation  Referring Provider:      Primary Care Physician:  [name], MD Primary Gastroenterologist:         Reason for Consultation:      [name]  is a 70 y.o. female with a known history of chronic diarrhea status post colostomy, endometrial carcinoma with recent chemotherapy presents to the emergency room from cancer center due to acute kidney injury with creatinine of 4.3. Patient had similar admissions recently twice causing dehydration from chronic diarrhea worsened by chemotherapy along with vomiting and decreased oral intake. Patient was started on Sandostatin Depakote shot. Her symptoms have worsened acutely since chemotherapy on 04/05/2016. She was initially called to the cancer center for IV hydration but with abnormal blood work was sent to the emergency room. She has not noticed any hematemesis or melena or blood in stool. No fever. No recent antibiotic use. C. difficile and GI panel by PCR for other pathogens were checked on 03-26-16 were negative. Saw Dr Roselyn Reef and was scheduled for colonoscopy not until 05-2016.  Sandostatin stopped because of prolonged QT interval  Impression / Plan:   Chronic diarrhea: Most likely chemotherapy induced but other causes must be considered. Neutropenic so not an ideal candidate for colonoscopy with biopsies (exclude microscopic colitis or CMV)  at this time. I recommend first a trial of Lomotil 1 tab bid to tid to see if reduces ostomy output since infection was ruled out a few weeks ago.  Colestid can also be tried but typically works better in those who have had a right sided colectomy.            HPI:   [name] is a 70 y.o. female   Past Medical History:  Diagnosis Date  . Cancer [facility]) 2011   endometrial  . Chronic kidney disease    STONES  . Diverticulitis of colon (without mention of hemorrhage)   . History of kidney stones 2011  . Mechanical complication of colostomy and enterostomy   . Obesity, unspecified    . Personal history of tobacco use, presenting hazards to health     Past Surgical History:  Procedure Laterality Date  . ABDOMINAL HYSTERECTOMY  2011  . APPENDECTOMY    . CHOLECYSTECTOMY  05/07/14   Incidental during ventral hernia repair.   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 COLON SURGERY  05/20/1999   Drainage of pelvic abscess, end colostomy  . COLON SURGERY  11/15/2010   Revision of colostomy stoma  . COLONOSCOPY  June 2010,03/31/14   Tubular adenoma of the transverse colon, hyperplastic polyps of the sigmoid.  [facility]land Kitchen COLOSTOMY  2011  . DILATION AND CURETTAGE OF UTERUS    . HERNIA REPAIR  04/06/14   ventral hernia, tetro rectus Ventralex ST mesh, 20 x 33 cm.   [facility] Kitchen HYSTEROSCOPY  2011  . LITHOTRIPSY  2009  . MOLE REMOVAL  2003  . PORTACATH PLACEMENT N/A 03/01/2016   Procedure: INSERTION PORT-A-CATH;  Surgeon: [name], MD;  Location: [facility] ORS;  Service: General;  Laterality: N/A;  . PYELOLITHOTOMY    . skin cancer removal  02/2014  . TUBAL LIGATION      Family History  Problem Relation Age of Onset  . Breast cancer Maternal Grandmother   . Colon cancer Mother   . Cancer Mother      Social History  Substance Use Topics  . Smoking status: Current Every Day  Smoker    Packs/day: 0.25    Years: 15.00    Types: Cigarettes  . Smokeless tobacco: Never Used  . Alcohol use No    Prior to Admission medications   Medication Sig Start Date End Date Taking? Authorizing Provider  octreotide (SANDOSTATIN) 100 MCG/ML SOLN injection Inject 1 mL (100 mcg total) into the skin every 8 (eight) hours. 04/11/16  Yes Lequita Asal, MD  ondansetron (ZOFRAN) 8 MG tablet Take 8 mg by mouth 2 (two) times daily as needed.  02/28/16  Yes Historical Provider, MD  potassium chloride 20 MEQ TBCR Take 20 mEq by mouth 2 (two) times daily. 03/22/16  Yes Srikar Sudini, MD  promethazine (PHENERGAN) 25 MG tablet Take  1 tablet (25 mg total) by mouth every 8 (eight) hours as needed for nausea or vomiting. 04/11/16  Yes Lequita Asal, MD  sodium bicarbonate 650 MG tablet Take 1 tablet (650 mg total) by mouth 2 (two) times daily. 03/28/16  Yes Theodoro Grist, MD  Syringe, Disposable, 1 ML MISC 1 Units by Does not apply route 3 (three) times daily. 03/23/16   [name], MD    Current Facility-Administered Medications  Medication Dose Route Frequency Provider Last Rate Last Dose  . 0.9 % NaCl with KCl 20 mEq/ L  infusion   Intravenous Continuous [name], MD 125 mL/hr at 04/14/16 1116    . acetaminophen (TYLENOL) tablet 650 mg  650 mg Oral Q6H PRN [name], MD   650 mg at 04/14/16 0631   Or  . acetaminophen (TYLENOL) suppository 650 mg  650 mg Rectal Q6H PRN Srikar Sudini, MD      . albuterol (PROVENTIL) (2.5 MG/3ML) 0.083% nebulizer solution 2.5 mg  2.5 mg Nebulization Q2H PRN Srikar Sudini, MD      . calcium carbonate (TUMS - dosed in mg elemental calcium) chewable tablet 200 mg of elemental calcium  1 tablet Oral TID WC Harrie Foreman, MD   200 mg of elemental calcium at 04/14/16 1217  . diphenoxylate-atropine (LOMOTIL) 2.5-0.025 MG per tablet 1 tablet  1 tablet Oral QID PRN Srikar Sudini, MD      . feeding supplement (BOOST / RESOURCE BREEZE) liquid 1 Container  1 Container Oral TID BM Fritzi Mandes, MD   1 Container at 04/14/16 1000  . heparin injection 5,000 Units  5,000 Units Subcutaneous Q8H [name], MD   5,000 Units at 04/14/16 0529  . loperamide ([name]) capsule 2 mg  2 mg Oral Q6H Srikar Sudini, MD   2 mg at 04/14/16 1217  . LORazepam (ATIVAN) tablet 0.5 mg  0.5 mg Oral Q8H PRN [name], MD   0.5 mg at 04/13/16 0024  . ondansetron (ZOFRAN) tablet 4 mg  4 mg Oral Q6H PRN [name], MD       Or  . ondansetron (ZOFRAN) injection 4 mg  4 mg Intravenous Q6H PRN [name], MD   4 mg at 04/14/16 1349  . oxyCODONE (Oxy IR/ROXICODONE) immediate release tablet 5 mg  5 mg Oral Q4H  PRN Srikar Sudini, MD      . potassium chloride 10 mEq in 100 mL IVPB  10 mEq Intravenous Q1 Hr x [address] [place], [facility]      . promethazine (PHENERGAN) injection 12.5 mg  12.5 mg Intravenous Q6H PRN [name], MD   12.5 mg at 04/13/16 2254  . sodium chloride flush (NS) 0.9 % injection 3 mL  3 mL Intravenous Q12H [name], MD  3 mL at 04/13/16 1000  . sodium citrate-citric acid (ORACIT) solution 30 mL  30 mL Oral BID PC Harmeet Singh, MD   30 mL at 04/14/16 1105    Allergies as of 04/12/2016  . (No Known Allergies)     Review of Systems:    This is positive for those things mentioned in the the HPI. All other review of systems are negative.       Physical Exam:  Vital signs in last 24 hours: Temp:  [97.7 F (36.5 C)-98.4 F (36.9 C)] 97.7 F (36.5 C) (08/12 1223) Pulse Rate:  [86-190] 86 (08/12 1223) Resp:  [19] 19 (08/11 2029) BP: (74-116)/(49-91) 82/50 (08/12 1223) SpO2:  [86 %-100 %] 97 % (08/12 1223) Weight:  [68.4 kg (150 lb 12.8 oz)] 68.4 kg (150 lb 12.8 oz) (08/12 0500) Last BM Date: 04/13/16  General:  Well-developed, well-nourished and in no acute distress Eyes:  anicteric. ENT:   Mouth and posterior pharynx free of lesions.  Neck:   supple w/o thyromegaly or mass.  Lungs: Clear to auscultation bilaterally. Heart:  S1S2, no rubs, murmurs, gallops. Abdomen:  soft, non-tender, no hepatosplenomegaly, hernia, or mass and BS+.  Rectal: Lymph:  no cervical or supraclavicular adenopathy. Extremities:   no edema Skin   no rash. Neuro:  A&O x 3.  Psych:  appropriate mood and  Affect.   Data Reviewed:   LAB RESULTS:  Recent Labs  04/12/16 0843 04/13/16 0500 04/14/16 0414  WBC 0.9* 0.8* 1.0*  HGB 12.2 10.7* 9.7*  HCT 36.6 31.4* 28.9*  PLT 192 102* 61*   BMET  Recent Labs  04/12/16 0843 04/13/16 0500 04/14/16 0414  NA 134* 133* 134*  K 3.7 3.0* 2.8*  CL 93* 104 112*  CO2 21* 18* 14*  GLUCOSE 160* 118* 84  BUN 86* 79* 72*  CREATININE  4.35* 3.11* 2.43*  CALCIUM 6.4* 5.9* 6.3*   LFT  Recent Labs  04/12/16 1419  ALBUMIN 1.8*   PT/INR No results for input(s): LABPROT, INR in the last 72 hours.  STUDIES: No results found.   PREVIOUS ENDOSCOPIES:              Thanks   LOS: 2 days   San Jetty MD @  04/14/2016, 2:18 PM

## 2016-04-15 DIAGNOSIS — D696 Thrombocytopenia, unspecified: Secondary | ICD-10-CM

## 2016-04-15 DIAGNOSIS — E878 Other disorders of electrolyte and fluid balance, not elsewhere classified: Secondary | ICD-10-CM

## 2016-04-15 DIAGNOSIS — D72819 Decreased white blood cell count, unspecified: Secondary | ICD-10-CM

## 2016-04-15 LAB — CBC WITH DIFFERENTIAL/PLATELET
Basophils Absolute: 0 10*3/uL (ref 0–0.1)
Basophils Relative: 0 %
Eosinophils Absolute: 0 10*3/uL (ref 0–0.7)
Eosinophils Relative: 0 %
HCT: 26.7 % — ABNORMAL LOW (ref 35.0–47.0)
Hemoglobin: 9 g/dL — ABNORMAL LOW (ref 12.0–16.0)
Lymphocytes Relative: 16 %
Lymphs Abs: 0.4 10*3/uL — ABNORMAL LOW (ref 1.0–3.6)
MCH: 32.7 pg (ref 26.0–34.0)
MCHC: 33.7 g/dL (ref 32.0–36.0)
MCV: 96.8 fL (ref 80.0–100.0)
Monocytes Absolute: 0.3 10*3/uL (ref 0.2–0.9)
Monocytes Relative: 12 %
Neutro Abs: 1.9 10*3/uL (ref 1.4–6.5)
Neutrophils Relative %: 72 %
Platelets: 42 10*3/uL — ABNORMAL LOW (ref 150–440)
RBC: 2.75 MIL/uL — ABNORMAL LOW (ref 3.80–5.20)
RDW: 16 % — ABNORMAL HIGH (ref 11.5–14.5)
WBC: 2.6 10*3/uL — ABNORMAL LOW (ref 3.6–11.0)

## 2016-04-15 LAB — BASIC METABOLIC PANEL
ANION GAP: 8 (ref 5–15)
BUN: 64 mg/dL — AB (ref 6–20)
CHLORIDE: 120 mmol/L — AB (ref 101–111)
CO2: 11 mmol/L — ABNORMAL LOW (ref 22–32)
Calcium: 6.8 mg/dL — ABNORMAL LOW (ref 8.9–10.3)
Creatinine, Ser: 2.13 mg/dL — ABNORMAL HIGH (ref 0.44–1.00)
GFR, EST AFRICAN AMERICAN: 26 mL/min — AB (ref >60)
GFR, EST NON AFRICAN AMERICAN: 23 mL/min — AB (ref >60)
Glucose, Bld: 88 mg/dL (ref 65–99)
POTASSIUM: 3.3 mmol/L — AB (ref 3.5–5.1)
SODIUM: 139 mmol/L (ref 135–145)

## 2016-04-15 LAB — MAGNESIUM: MAGNESIUM: 1.6 mg/dL — AB (ref 1.7–2.4)

## 2016-04-15 LAB — PHOSPHORUS: PHOSPHORUS: 2.8 mg/dL (ref 2.5–4.6)

## 2016-04-15 MED ORDER — POTASSIUM CHLORIDE 10 MEQ/100ML IV SOLN
10.0000 meq | INTRAVENOUS | Status: AC
  Administered 2016-04-15 (×3): 10 meq via INTRAVENOUS
  Filled 2016-04-15 (×3): qty 100

## 2016-04-15 MED ORDER — OCTREOTIDE ACETATE 100 MCG/ML IJ SOLN
100.0000 ug | Freq: Three times a day (TID) | INTRAMUSCULAR | Status: DC
  Administered 2016-04-15 – 2016-04-20 (×16): 100 ug via SUBCUTANEOUS
  Filled 2016-04-15 (×22): qty 1

## 2016-04-15 MED ORDER — MAGNESIUM SULFATE 2 GM/50ML IV SOLN
2.0000 g | Freq: Once | INTRAVENOUS | Status: AC
  Administered 2016-04-15: 2 g via INTRAVENOUS
  Filled 2016-04-15: qty 50

## 2016-04-15 NOTE — Progress Notes (Signed)
MEDICATION RELATED CONSULT NOTE - INITIAL   Pharmacy Consult for electrolyte monitoring/replacement Indication: hypokalemia  No Known Allergies  Patient Measurements: Height: 5\' 2"  (157.5 cm) Weight: 150 lb 12.8 oz (68.4 kg) IBW/kg (Calculated) : 50.1  Vital Signs: Temp: 97.6 F (36.4 C) (08/13 0419) Temp Source: Oral (08/13 0419) BP: 90/54 (08/13 0419) Pulse Rate: 89 (08/13 0419) Intake/Output from previous day: 08/12 0701 - 08/13 0700 In: 2034 [P.O.:480; I.V.:1427; IV Piggyback:127] Out: 2225 [Urine:500; Stool:1725] Intake/Output from this shift: No intake/output data recorded.  Labs:  Recent Labs  04/12/16 1419 04/13/16 0500 04/14/16 0414 04/15/16 0519  WBC  --  0.8* 1.0* 2.6*  HGB  --  10.7* 9.7* 9.0*  HCT  --  31.4* 28.9* 26.7*  PLT  --  102* 61* 42*  CREATININE  --  3.11* 2.43* 2.13*  MG  --  2.4 1.8 1.6*  PHOS 5.9*  --   --  2.8  ALBUMIN 1.8*  --   --   --    Estimated Creatinine Clearance: 22.6 mL/min (by C-G formula based on SCr of 2.13 mg/dL).  Assessment: Pharmacy consulted to monitor and replace electrolytes in this 70 year old female with hypokalemia. Patient is receiving NS20K at 125 ml/hr.   Goal of Therapy:  Electrolytes within normal limits  Plan:  K= 3.3, Mag= 1.6. Potassium chloride 10 meq iv x 3 and magnesium sulfate 2 g iv once. Will f/u AM labs.   Napoleon Form, PharmD Clinical Pharmacist 04/15/2016,7:23 AM

## 2016-04-15 NOTE — Progress Notes (Signed)
[facility] Hematology/Oncology Progress Note  Date of admission: 04/12/2016  Hospital day:  04/15/16  Chief Complaint: [name] is a 70 y.o. female with recurrent endometrial cancer who was admitted on day 8 s/p cycle #2 carboplatin and Taxol with acute renal insufficiency.   Subjective:  Feels a little better.  Nausea is episodic.  Liquid stool output continues.  Pills out ostomy.  Social History: The patient is alone today.    Allergies: No Known Allergies  Scheduled Medications: . calcium carbonate  1 tablet Oral TID WC  . feeding supplement  1 Container Oral TID BM  . heparin  5,000 Units Subcutaneous Q8H  . loperamide  2 mg Oral Q6H  . potassium chloride  10 mEq Intravenous Q1 Hr x 3  . sodium chloride flush  3 mL Intravenous Q12H  . sodium citrate-citric acid  30 mL Oral BID PC    Review of Systems: GENERAL: Feels better. No fevers, sweats or weight loss. PERFORMANCE STATUS (ECOG): 2 HEENT: No visual changes, runny nose, sore throat, mouth sores or tenderness. Lungs: No shortness of breath or cough. No hemoptysis. Cardiac: No chest pain, palpitations, orthopnea, or PND. GI: Nausea,episodic (improved). Liquid stool output from ostomy (no change).  Denies abdominal pain. No vomiting, constipation, melena or hematochezia. GU: No urgency, frequency, dysuria, or hematuria. Musculoskeletal: No back pain. No joint pain. No muscle tenderness. Extremities: No pain or swelling. Skin: No rashes or skin changes. Neuro: General fatigue.  No headache, numbness or weakness, balance or coordination issues. Endocrine: No diabetes, thyroid issues, hot flashes or night sweats. Psych: No mood changes, depression or anxiety. Pain: No focal pain. Review of systems: All other systems reviewed and found to be negative.  Physical Exam: Blood pressure (!) 90/54, pulse 89, temperature 97.6 F (36.4 C), temperature source Oral, resp. rate 18,  height _0  (1.575 m), weight 150 lb 12.8 oz (68.4 kg), SpO2 99 %.  GENERAL: Well developed, well nourished, woman sitting up in bed eating rice cripsies on the medical unit in no acute distress.  MENTAL STATUS: Alert and oriented to person, place and time. HEAD: Short gray hair. Normocephalic, atraumatic, face symmetric, no Cushingoid features. EYES: Blue eyes. Pupils equal round and reactive to light and accomodation. No conjunctivitis or scleral icterus. ENT: Oropharynx clear without lesion. Tongue normal. Mucous membranes moist.  RESPIRATORY: Clear to auscultation without rales, wheezes or rhonchi. CARDIOVASCULAR: Regular rate and rhythm without murmur, rub or gallop. ABDOMEN: Colostomy with liquid brown stool. Soft, non-tender, with active bowel sounds, and no hepatosplenomegaly. No masses. SKIN: No rashes, ulcers or lesions. EXTREMITIES: No edema, no skin discoloration or tenderness. No palpable cords. LYMPH NODES: No palpable cervical, supraclavicular, axillary or inguinal adenopathy  NEUROLOGICAL: Unremarkable. PSYCH: Appropriate.   Results for orders placed or performed during the hospital encounter of 04/12/16 (from the past 48 hour(s))  CBC with Differential     Status: Abnormal   Collection Time: 04/14/16  4:14 AM  Result Value Ref Range   WBC 1.0 (LL) 3.6 - 11.0 K/uL    Comment: CRITICAL RESULT CALLED TO, READ BACK BY AND VERIFIED WITH: TRICIA KING AT 0525 ON 04/14/16 R[facility] WHITE COUNT CONFIRMED ON SMEAR RESULT REPEATED AND VERIFIED    RBC 2.96 (L) 3.80 - 5.20 MIL/uL   Hemoglobin 9.7 (L) 12.0 - 16.0 g/dL   HCT 28.9 (L) 35.0 - 47.0 %   MCV 97.4 80.0 - 100.0 fL   MCH 32.8 26.0 - 34.0 pg  MCHC 33.7 32.0 - 36.0 g/dL   RDW 15.4 (H) 11.5 - 14.5 %   Platelets 61 (L) 150 - 440 K/uL    Comment: COUNT MAY BE INACCURATE DUE TO FIBRIN CLUMPS.   Neutrophils Relative % 52% %   Neutro Abs 0.5 (L) 1.4 - 6.5 K/uL   Lymphocytes Relative 25% %   Lymphs Abs 0.3 (L) 1.0  - 3.6 K/uL   Monocytes Relative 22% %   Monocytes Absolute 0.2 0.2 - 0.9 K/uL   Eosinophils Relative 1% %   Eosinophils Absolute 0.0 0 - 0.7 K/uL   Basophils Relative 0% %   Basophils Absolute 0.0 0 - 0.1 K/uL   Smear Review TOO FEW TO COUNT, SMEAR AVAILABLE FOR REVIEW   Basic metabolic panel     Status: Abnormal   Collection Time: 04/14/16  4:14 AM  Result Value Ref Range   Sodium 134 (L) 135 - 145 mmol/L   Potassium 2.8 (L) 3.5 - 5.1 mmol/L   Chloride 112 (H) 101 - 111 mmol/L   CO2 14 (L) 22 - 32 mmol/L   Glucose, Bld 84 65 - 99 mg/dL   BUN 72 (H) 6 - 20 mg/dL   Creatinine, Ser 2.43 (H) 0.44 - 1.00 mg/dL   Calcium 6.3 (LL) 8.9 - 10.3 mg/dL    Comment: CRITICAL RESULT CALLED TO, READ BACK BY AND VERIFIED WITH VERNE BOOKER ON 04/14/16 AT 0752 BY [facility]    GFR calc non Af Amer 19 (L) >60 mL/min   GFR calc Af Amer 22 (L) >60 mL/min    Comment: (NOTE) The eGFR has been calculated using the CKD EPI equation. This calculation has not been validated in all clinical situations. eGFR's persistently <60 mL/min signify possible Chronic Kidney Disease.    Anion gap 8 5 - 15  Magnesium     Status: None   Collection Time: 04/14/16  4:14 AM  Result Value Ref Range   Magnesium 1.8 1.7 - 2.4 mg/dL  Potassium     Status: Abnormal   Collection Time: 04/14/16  8:08 PM  Result Value Ref Range   Potassium 3.2 (L) 3.5 - 5.1 mmol/L  CBC with Differential     Status: Abnormal   Collection Time: 04/15/16  5:19 AM  Result Value Ref Range   WBC 2.6 (L) 3.6 - 11.0 K/uL   RBC 2.75 (L) 3.80 - 5.20 MIL/uL   Hemoglobin 9.0 (L) 12.0 - 16.0 g/dL   HCT 26.7 (L) 35.0 - 47.0 %   MCV 96.8 80.0 - 100.0 fL   MCH 32.7 26.0 - 34.0 pg   MCHC 33.7 32.0 - 36.0 g/dL   RDW 16.0 (H) 11.5 - 14.5 %   Platelets 42 (L) 150 - 440 K/uL   Neutrophils Relative % 72 %   Lymphocytes Relative 16 %   Monocytes Relative 12 %   Eosinophils Relative 0 %   Basophils Relative 0 %   Neutro Abs 1.9 1.4 - 6.5 K/uL   Lymphs Abs  0.4 (L) 1.0 - 3.6 K/uL   Monocytes Absolute 0.3 0.2 - 0.9 K/uL   Eosinophils Absolute 0.0 0 - 0.7 K/uL   Basophils Absolute 0.0 0 - 0.1 K/uL  Basic metabolic panel     Status: Abnormal   Collection Time: 04/15/16  5:19 AM  Result Value Ref Range   Sodium 139 135 - 145 mmol/L   Potassium 3.3 (L) 3.5 - 5.1 mmol/L   Chloride 120 (H) 101 - 111 mmol/L  CO2 11 (L) 22 - 32 mmol/L   Glucose, Bld 88 65 - 99 mg/dL   BUN 64 (H) 6 - 20 mg/dL   Creatinine, Ser 2.13 (H) 0.44 - 1.00 mg/dL   Calcium 6.8 (L) 8.9 - 10.3 mg/dL   GFR calc non Af Amer 23 (L) >60 mL/min   GFR calc Af Amer 26 (L) >60 mL/min    Comment: (NOTE) The eGFR has been calculated using the CKD EPI equation. This calculation has not been validated in all clinical situations. eGFR's persistently <60 mL/min signify possible Chronic Kidney Disease.    Anion gap 8 5 - 15  Magnesium     Status: Abnormal   Collection Time: 04/15/16  5:19 AM  Result Value Ref Range   Magnesium 1.6 (L) 1.7 - 2.4 mg/dL  Phosphorus     Status: None   Collection Time: 04/15/16  5:19 AM  Result Value Ref Range   Phosphorus 2.8 2.5 - 4.6 mg/dL   No results found.  Assessment:  [name] is a 70 y.o. female with with recurrent endometrial cancer admitted on day 8 s/p cycle #2 carboplatin and Taxol secondary to acute renal insufficiency secondary to dehydration from poor oral inake, nausea/vomiting, and significant ostomy output. She is improving with IVF. Creatinine from 4.35 to 2.13. Potassium, magnesium, and calcium are being repleted.  Plan:  1. Oncology: Day 9 s/p cycle #2 carboplatin and Taxol. Episodic nausea managed well with Phenergan.  Neutropenia resolved.  Thrombocytopenia (platlets 42,000).  No bleeding.  Follow counts daily.   2. Renal: IVF continue.  Slight improvement in creatinine 4.35 to 2.13.  Bicarb remains low (11) secondary to GI losses.  Patient previously on sodium bicarbonate tablets.  Magnesium improved.   Potassium being supplemented.  Appreciate nephrology consult.  3. Gastroenterology: Patient previously noted improvement in GI output with short acting octreotide injections every 8 hours.  She received depot octreotide (sandostatin LAR 10 mg) on day of chemotherapy (04/05/2016).  Octreotide currently being held.  GI recommended Lomotil and possibly Colestid.  Unfortunately they have not been effective as pills come out ostomy.  Anticipate repeat EKG once electrolytes normal to assess QT and possible reinstitution of short acting octreotide.  Will discuss with Dr. Allen Norris in AM.  Anticipate outpatient colonoscopy once platelet count recovers.  4.  Infectious disease:  No current issues.  No longer neutropenic.  Afebrile on no antibiotics.   Lequita Asal, MD  04/15/2016, 10:05 AM

## 2016-04-15 NOTE — Progress Notes (Signed)
[place] [facility] [place] NAME: [name]    MR#:  [id number]  DATE OF BIRTH:  Jan 19, 1946  SUBJECTIVE:  Feels a bit better than y'day Colostomy output increasing on and off since June. No improvement since admission  REVIEW OF SYSTEMS:   Review of Systems  Constitutional: Positive for malaise/fatigue. Negative for chills, fever and weight loss.  HENT: Negative for ear discharge, ear pain and nosebleeds.   Eyes: Negative for blurred vision, pain and discharge.  Respiratory: Negative for sputum production, shortness of breath, wheezing and stridor.   Cardiovascular: Negative for chest pain, palpitations, orthopnea and PND.  Gastrointestinal: Positive for diarrhea. Negative for abdominal pain, nausea and vomiting.  Genitourinary: Negative for frequency and urgency.  Musculoskeletal: Negative for back pain and joint pain.  Neurological: Positive for weakness. Negative for sensory change, speech change and focal weakness.  Psychiatric/Behavioral: Negative for depression and hallucinations. The patient is not nervous/anxious.   All other systems reviewed and are negative.  Tolerating Diet:yes Tolerating [id number]   DRUG ALLERGIES:  No Known Allergies  VITALS:  Blood pressure (!) 90/54, pulse 89, temperature 97.6 F (36.4 C), temperature source Oral, resp. rate 18, height 5\' 2"  (1.575 m), weight 68.4 kg (150 lb 12.8 oz), SpO2 99 %.  PHYSICAL EXAMINATION:   Physical Exam  GENERAL:  70 y.o.-year-old patient lying in the bed with no acute distress.  EYES: Pupils equal, round, reactive to light and accommodation. No scleral icterus. Extraocular muscles intact.  HEENT: Head atraumatic, normocephalic. Oropharynx and nasopharynx clear.  NECK:  Supple, no jugular venous distention. No thyroid enlargement, no tenderness.  LUNGS: Normal breath sounds bilaterally, no wheezing, rales, rhonchi. No use of accessory muscles of respiration.   CARDIOVASCULAR: S1, S2 normal. No murmurs, rubs, or gallops.  ABDOMEN: Soft, nontender, nondistended. Bowel sounds present. No organomegaly or mass. Colostomy+ EXTREMITIES: No cyanosis, clubbing or edema b/l.    NEUROLOGIC: Cranial nerves II through XII are intact. No focal Motor or sensory deficits b/l.   PSYCHIATRIC:  patient is alert and oriented x 3.  SKIN: No obvious rash, lesion, or ulcer.   LABORATORY PANEL:  CBC  Recent Labs Lab 04/15/16 0519  WBC 2.6*  HGB 9.0*  HCT 26.7*  PLT 42*    Chemistries   Recent Labs Lab 04/15/16 0519  NA 139  K 3.3*  CL 120*  CO2 11*  GLUCOSE 88  BUN 64*  CREATININE 2.13*  CALCIUM 6.8*  MG 1.6*   Cardiac Enzymes No results for input(s): TROPONINI in the last 168 hours. RADIOLOGY:  No results found. ASSESSMENT AND PLAN:  * Acute kidney injury due to severe dehydration from diarrhea and decreased oral intake/ vomiting Replace IV fluids aggressively.  -replete electrolytes Consult with nephrology appreciated. 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 At this time due to prolonged QT on EKG we  held off on daily shots. Consult GI apprecited -Nothing else has helped pt with slowing diarrhea other than octreotide. HEr QI interval  Is stable -will resume again while in the hospital and cont cardiac monitoring    * Prolonged QT is likely due to hypomagnesemia. Replaced magnesium.  -Had run of SVT last nite-resolved  * Endometrial carcinoma Patient follows with Dr. Mike Gip as outpatient  * Neutropenia - due to chemotherapy Neutropenic precautions.   * Hypocalcemia Corrected calcium 7.7 Getting tums daily  *  DVT prophylaxis with heparin subcutaneous  Case discussed with Care Management/Social Worker. Management plans discussed with the patient, family and they are in agreement.  CODE STATUS:  full   TOTAL TIME TAKING CARE OF THIS PATIENT:30 minutes.  >50% time spent on counselling and coordination of care  POSSIBLE D/C IN 1-2 DAYS, DEPENDING ON CLINICAL CONDITION.  Note: This dictation was prepared with Dragon dictation along with smaller phrase technology. Any transcriptional errors that result from this process are unintentional.  Clarice Zulauf M.D on 04/15/2016 at 1:34 PM  Between 7am to 6pm - Pager - [phone]  After 6pm go to [email or web address] - password [email or web address]  [facility]  Office  [phone]  CC: Primary care physician; [name], MD

## 2016-04-15 NOTE — Consult Note (Signed)
GI Inpatient Follow-up Note  Patient Identification: [name] is a 70 y.o. female  Subjective:  Scheduled Inpatient Medications:  . calcium carbonate  1 tablet Oral TID WC  . feeding supplement  1 Container Oral TID BM  . heparin  5,000 Units Subcutaneous Q8H  . loperamide  2 mg Oral Q6H  . sodium chloride flush  3 mL Intravenous Q12H  . sodium citrate-citric acid  30 mL Oral BID PC    Continuous Inpatient Infusions:   . 0.9 % NaCl with KCl 20 mEq / L 125 mL/hr at 04/15/16 0734    PRN Inpatient Medications:  acetaminophen **OR** acetaminophen, albuterol, diphenoxylate-atropine, LORazepam, ondansetron **OR** ondansetron (ZOFRAN) IV, oxyCODONE, promethazine  Review of Systems:  Constitutional: Weight is stable.  Eyes: No changes in vision. ENT: No oral lesions, sore throat.  GI: see HPI.  Heme/Lymph: No easy bruising.  CV: No chest pain.  GU: No hematuria.  Integumentary: No rashes.  Neuro: No headaches.  Psych: No depression/anxiety.  Endocrine: No heat/cold intolerance.  Allergic/Immunologic: No urticaria.  Resp: No cough, SOB.  Musculoskeletal: No joint swelling.    Physical Examination: BP (!) 90/54 (BP Location: Left Arm)   Pulse 89   Temp 97.6 F (36.4 C) (Oral)   Resp 18   Ht 5\' 2"  (1.575 m)   Wt 68.4 kg (150 lb 12.8 oz)   SpO2 99%   BMI 27.58 kg/m  Gen: NAD, alert and oriented x 4 HEENT: PEERLA, EOMI, Neck: supple, no JVD or thyromegaly Chest: CTA bilaterally, no wheezes, crackles, or other adventitious sounds CV: RRR, no m/g/c/r Abd: soft, NT, ND, +BS in all four quadrants; no HSM, guarding, ridigity, or rebound tenderness Ext: no edema Skin: no rash or lesions noted   Data: Lab Results  Component Value Date   WBC 2.6 (L) 04/15/2016   HGB 9.0 (L) 04/15/2016   HCT 26.7 (L) 04/15/2016   MCV 96.8 04/15/2016   PLT 42 (L) 04/15/2016    Recent Labs Lab 04/13/16 0500 04/14/16 0414 04/15/16 0519  HGB 10.7* 9.7* 9.0*   Lab Results   Component Value Date   NA 139 04/15/2016   K 3.3 (L) 04/15/2016   CL 120 (H) 04/15/2016   CO2 11 (L) 04/15/2016   BUN 64 (H) 04/15/2016   CREATININE 2.13 (H) 04/15/2016   Lab Results  Component Value Date   ALT 36 04/05/2016   AST 33 04/05/2016   ALKPHOS 177 (H) 04/05/2016   BILITOT 0.5 04/05/2016   No results for input(s): APTT, INR, PTT in the last 168 hours.  Assessment/Plan: [name] is a 70 y.o. female   Recommendations:  Please call with questions or concerns.  San Jetty, MD  GI Inpatient Follow-up Note  Patient Identification: [name] is a 70 y.o. female with diarrhea in ostomy. Octreotide helped but was held for prolonged QT but was helpful. Minimal benefit to imodium or Lomotil. Dr. Roselyn Reef out of town. Not sure on return date.  Subjective:  Scheduled Inpatient Medications:  . calcium carbonate  1 tablet Oral TID WC  . feeding supplement  1 Container Oral TID BM  . heparin  5,000 Units Subcutaneous Q8H  . loperamide  2 mg Oral Q6H  . sodium chloride flush  3 mL Intravenous Q12H  . sodium citrate-citric acid  30 mL Oral BID PC    Continuous Inpatient Infusions:   . 0.9 % NaCl with KCl 20 mEq / L 125 mL/hr at 04/15/16 [id number]  PRN Inpatient Medications:  acetaminophen **OR** acetaminophen, albuterol, diphenoxylate-atropine, LORazepam, ondansetron **OR** ondansetron (ZOFRAN) IV, oxyCODONE, promethazine  Review of Systems:  Constitutional: Weight is stable.  Eyes: No changes in vision. ENT: No oral lesions, sore throat.  GI: see HPI.  Heme/Lymph: No easy bruising.  CV: No chest pain.  GU: No hematuria.  Integumentary: No rashes.  Neuro: No headaches.  Psych: No depression/anxiety.  Endocrine: No heat/cold intolerance.  Allergic/Immunologic: No urticaria.  Resp: No cough, SOB.  Musculoskeletal: No joint swelling.    Physical Examination: BP (!) 90/54 (BP Location: Left Arm)   Pulse 89   Temp 97.6 F (36.4 C) (Oral)   Resp 18    Ht 5\' 2"  (1.575 m)   Wt 68.4 kg (150 lb 12.8 oz)   SpO2 99%   BMI 27.58 kg/m  Gen: NAD, alert and oriented x 4 HEENT: PEERLA, EOMI, Neck: supple, no JVD or thyromegaly Chest: CTA bilaterally, no wheezes, crackles, or other adventitious sounds CV: RRR, no m/g/c/r Abd: soft, NT, ND, +BS in all four quadrants; no HSM, guarding, ridigity, or rebound tenderness Ext: no edema Skin: no rash or lesions noted   Data: Lab Results  Component Value Date   WBC 2.6 (L) 04/15/2016   HGB 9.0 (L) 04/15/2016   HCT 26.7 (L) 04/15/2016   MCV 96.8 04/15/2016   PLT 42 (L) 04/15/2016    Recent Labs Lab 04/13/16 0500 04/14/16 0414 04/15/16 0519  HGB 10.7* 9.7* 9.0*   Lab Results  Component Value Date   NA 139 04/15/2016   K 3.3 (L) 04/15/2016   CL 120 (H) 04/15/2016   CO2 11 (L) 04/15/2016   BUN 64 (H) 04/15/2016   CREATININE 2.13 (H) 04/15/2016   Lab Results  Component Value Date   ALT 36 04/05/2016   AST 33 04/05/2016   ALKPHOS 177 (H) 04/05/2016   BILITOT 0.5 04/05/2016   No results for input(s): APTT, INR, PTT in the last 168 hours.  Assessment/Plan: [name] is a 70 y.o. female diarrhea related to chemotherapy  Recommendations: I would try to resume Octreotide as soon as possible and combine with oral agents. Flex sigmoidoscopy or colonoscopy with biopsies may determine if patient has microscopic colitis or underlying viral colitis (CMV). PLT count of 42 should not prevent biopsies. I would also consider repeating stool studies.   Dr. Roselyn Reef is out of town and I am unclear date of return. Please call with questions or concerns.  San Jetty, MD

## 2016-04-16 LAB — CBC WITH DIFFERENTIAL/PLATELET
Band Neutrophils: 4 %
Basophils Absolute: 0 10*3/uL (ref 0–0.1)
Basophils Relative: 0 %
Blasts: 0 %
Eosinophils Absolute: 0 10*3/uL (ref 0–0.7)
Eosinophils Relative: 0 %
HCT: 27.1 % — ABNORMAL LOW (ref 35.0–47.0)
Hemoglobin: 9 g/dL — ABNORMAL LOW (ref 12.0–16.0)
Lymphocytes Relative: 21 %
Lymphs Abs: 0.7 10*3/uL — ABNORMAL LOW (ref 1.0–3.6)
MCH: 32.5 pg (ref 26.0–34.0)
MCHC: 33.1 g/dL (ref 32.0–36.0)
MCV: 98.1 fL (ref 80.0–100.0)
Metamyelocytes Relative: 0 %
Monocytes Absolute: 0.2 10*3/uL (ref 0.2–0.9)
Monocytes Relative: 7 %
Myelocytes: 0 %
Neutro Abs: 2.2 10*3/uL (ref 1.4–6.5)
Neutrophils Relative %: 68 %
Other: 0 %
Platelets: 27 10*3/uL — CL (ref 150–440)
Promyelocytes Absolute: 0 %
RBC: 2.76 MIL/uL — ABNORMAL LOW (ref 3.80–5.20)
RDW: 16.8 % — ABNORMAL HIGH (ref 11.5–14.5)
WBC: 3.1 10*3/uL — ABNORMAL LOW (ref 3.6–11.0)
nRBC: 0 /100 WBC

## 2016-04-16 LAB — BASIC METABOLIC PANEL
Anion gap: 5 (ref 5–15)
BUN: 62 mg/dL — AB (ref 6–20)
CHLORIDE: 124 mmol/L — AB (ref 101–111)
CO2: 10 mmol/L — ABNORMAL LOW (ref 22–32)
Calcium: 7.2 mg/dL — ABNORMAL LOW (ref 8.9–10.3)
Creatinine, Ser: 1.8 mg/dL — ABNORMAL HIGH (ref 0.44–1.00)
GFR, EST AFRICAN AMERICAN: 32 mL/min — AB (ref >60)
GFR, EST NON AFRICAN AMERICAN: 28 mL/min — AB (ref >60)
Glucose, Bld: 81 mg/dL (ref 65–99)
POTASSIUM: 4.1 mmol/L (ref 3.5–5.1)
SODIUM: 139 mmol/L (ref 135–145)

## 2016-04-16 LAB — MAGNESIUM: MAGNESIUM: 1.9 mg/dL (ref 1.7–2.4)

## 2016-04-16 MED ORDER — PHENOL 1.4 % MT LIQD
1.0000 | OROMUCOSAL | Status: DC | PRN
  Filled 2016-04-16 (×2): qty 177

## 2016-04-16 MED ORDER — SODIUM BICARBONATE 8.4 % IV SOLN
INTRAVENOUS | Status: DC
  Administered 2016-04-16 – 2016-04-20 (×7): via INTRAVENOUS
  Filled 2016-04-16 (×7): qty 850

## 2016-04-16 MED ORDER — SODIUM BICARBONATE 650 MG PO TABS
1300.0000 mg | ORAL_TABLET | Freq: Two times a day (BID) | ORAL | Status: DC
  Administered 2016-04-16 – 2016-04-20 (×9): 1300 mg via ORAL
  Filled 2016-04-16 (×10): qty 2

## 2016-04-16 NOTE — Progress Notes (Signed)
Subjective:  Renal function has improved. Creatinine down to 1.8. Still having significant amount of loose stools. Patient remains on IV fluid hydration. Thrombocytopenia also noted. Metabolic acidosis also persists.   Objective:  Vital signs in last 24 hours:  Temp:  [97.5 F (36.4 C)-97.7 F (36.5 C)] 97.5 F (36.4 C) (08/14 0509) Pulse Rate:  [71-102] 92 (08/14 0509) Resp:  [18-20] 19 (08/14 0509) BP: (95-114)/(62-72) 114/62 (08/14 0509) SpO2:  [99 %-100 %] 100 % (08/14 0509) Weight:  [72 kg (158 lb 12.8 oz)] 72 kg (158 lb 12.8 oz) (08/14 0619)  Weight change:  Filed Weights   04/13/16 0500 04/14/16 0500 04/16/16 0619  Weight: 67.3 kg (148 lb 4.8 oz) 68.4 kg (150 lb 12.8 oz) 72 kg (158 lb 12.8 oz)    Intake/Output:    Intake/Output Summary (Last 24 hours) at 04/16/16 1056 Last data filed at 04/16/16 0854  Gross per 24 hour  Intake             5303 ml  Output             1850 ml  Net             3453 ml     Physical Exam: General: No acute distress, laying in the bed   HEENT Moist oral mucous membranes   Neck Supple   Pulm/lungs Normal breathing effort, mild diffuse wheezing upon auscultation   CVS/Heart No rub or gallop   Abdomen:  Soft, nontender, nondistended, colostomy in place   Extremities: No peripheral edema   Neurologic: Alert, oriented   Skin: Scaly lesions over bilateral legs           Basic Metabolic Panel:   Recent Labs Lab 04/12/16 0843 04/12/16 1419 04/13/16 0500 04/14/16 0414 04/14/16 2008 04/15/16 0519 04/16/16 0436  NA 134*  --  133* 134*  --  139 139  K 3.7  --  3.0* 2.8* 3.2* 3.3* 4.1  CL 93*  --  104 112*  --  120* 124*  CO2 21*  --  18* 14*  --  11* 10*  GLUCOSE 160*  --  118* 84  --  88 81  BUN 86*  --  79* 72*  --  64* 62*  CREATININE 4.35*  --  3.11* 2.43*  --  2.13* 1.80*  CALCIUM 6.4*  --  5.9* 6.3*  --  6.8* 7.2*  MG 1.4*  --  2.4 1.8  --  1.6* 1.9  PHOS  --  5.9*  --   --   --  2.8  --      CBC:  Recent  Labs Lab 04/12/16 0843 04/13/16 0500 04/14/16 0414 04/15/16 0519 04/16/16 0436  WBC 0.9* 0.8* 1.0* 2.6* 3.1*  NEUTROABS 0.2*  --  0.5* 1.9 2.2  HGB 12.2 10.7* 9.7* 9.0* 9.0*  HCT 36.6 31.4* 28.9* 26.7* 27.1*  MCV 95.0 97.0 97.4 96.8 98.1  PLT 192 102* 61* 42* 27*      Microbiology:  Recent Results (from the past 720 hour(s))  Culture, blood (Routine X 2) w Reflex to ID Panel     Status: None   Collection Time: 03/18/16 10:44 AM  Result Value Ref Range Status   Specimen Description BLOOD RIGHT ARM  Final   Special Requests BOTTLES DRAWN AEROBIC AND ANAEROBIC  8CC  Final   Culture NO GROWTH 5 DAYS  Final   Report Status 03/23/2016 FINAL  Final  Culture, blood (Routine X 2) w Reflex  to ID Panel     Status: None   Collection Time: 03/18/16 10:44 AM  Result Value Ref Range Status   Specimen Description BLOOD RIGHT PORTA CATH CHEST  Final   Special Requests BOTTLES DRAWN AEROBIC AND ANAEROBIC  [place]  Final   Culture NO GROWTH 5 DAYS  Final   Report Status 03/23/2016 FINAL  Final  Urine culture     Status: Abnormal   Collection Time: 03/18/16 10:44 AM  Result Value Ref Range Status   Specimen Description URINE, RANDOM  Final   Special Requests NONE  Final   Culture MULTIPLE SPECIES PRESENT, SUGGEST RECOLLECTION (A)  Final   Report Status 03/20/2016 FINAL  Final  C difficile quick scan w PCR reflex     Status: None   Collection Time: 03/25/16  4:53 PM  Result Value Ref Range Status   C Diff antigen NEGATIVE NEGATIVE Final   C Diff toxin NEGATIVE NEGATIVE Final   C Diff interpretation No C. difficile detected.  Final  Urine culture     Status: Abnormal   Collection Time: 03/26/16  2:28 PM  Result Value Ref Range Status   Specimen Description URINE, CATHETERIZED  Final   Special Requests NONE  Final   Culture MULTIPLE SPECIES PRESENT, SUGGEST RECOLLECTION (A)  Final   Report Status 03/27/2016 FINAL  Final  Gastrointestinal Panel by PCR , Stool     Status: None   Collection  Time: 03/26/16  4:47 PM  Result Value Ref Range Status   Campylobacter species NOT DETECTED NOT DETECTED Final   Plesimonas shigelloides NOT DETECTED NOT DETECTED Final   Salmonella species NOT DETECTED NOT DETECTED Final   Yersinia enterocolitica NOT DETECTED NOT DETECTED Final   Vibrio species NOT DETECTED NOT DETECTED Final   Vibrio cholerae NOT DETECTED NOT DETECTED Final   Enteroaggregative E coli (EAEC) NOT DETECTED NOT DETECTED Final   Enteropathogenic E coli (EPEC) NOT DETECTED NOT DETECTED Final   Enterotoxigenic E coli (ETEC) NOT DETECTED NOT DETECTED Final   Shiga like toxin producing E coli (STEC) NOT DETECTED NOT DETECTED Final   E. coli O157 NOT DETECTED NOT DETECTED Final   Shigella/Enteroinvasive E coli (EIEC) NOT DETECTED NOT DETECTED Final   Cryptosporidium NOT DETECTED NOT DETECTED Final   Cyclospora cayetanensis NOT DETECTED NOT DETECTED Final   Entamoeba histolytica NOT DETECTED NOT DETECTED Final   Giardia lamblia NOT DETECTED NOT DETECTED Final   Adenovirus F40/41 NOT DETECTED NOT DETECTED Final   Astrovirus NOT DETECTED NOT DETECTED Final   Norovirus GI/GII NOT DETECTED NOT DETECTED Final   Rotavirus A NOT DETECTED NOT DETECTED Final   Sapovirus (I, II, IV, and V) NOT DETECTED NOT DETECTED Final  Urine culture     Status: None   Collection Time: 03/28/16  8:52 AM  Result Value Ref Range Status   Specimen Description URINE, CATHETERIZED  Final   Special Requests NONE  Final   Culture NO GROWTH Performed at [facility]   Final   Report Status 03/29/2016 FINAL  Final    Coagulation Studies: No results for input(s): LABPROT, INR in the last 72 hours.  Urinalysis: No results for input(s): COLORURINE, LABSPEC, PHURINE, GLUCOSEU, HGBUR, BILIRUBINUR, KETONESUR, PROTEINUR, UROBILINOGEN, NITRITE, LEUKOCYTESUR in the last 72 hours.  Invalid input(s): APPERANCEUR    Imaging: No results found.   Medications:   . 0.9 % NaCl with KCl 20 mEq / L 125  mL/hr at 04/16/16 1006   . calcium carbonate  1  tablet Oral TID WC  . feeding supplement  1 Container Oral TID BM  . loperamide  2 mg Oral Q6H  . octreotide  100 mcg Subcutaneous TID  . sodium bicarbonate  1,300 mg Oral BID  . sodium chloride flush  3 mL Intravenous Q12H   acetaminophen **OR** acetaminophen, albuterol, diphenoxylate-atropine, LORazepam, ondansetron **OR** ondansetron (ZOFRAN) IV, oxyCODONE, promethazine  Assessment/ Plan:  70 y.o. female with medical problems of Endometrial cancer undergoing chemotherapy, history of diverticulitis, colostomy, who was admitted to [facility] on 04/12/2016 for evaluation of increased diarrhea x 3 days, weakness, acute renal failure  1. Acute renal failure, nonoliguric Likely secondary to severe dehydration from increased colostomy output Baseline serum creatinine is 0.98 from August 3 Admission creatinine 4.35 -  Creatinine down to 1.8. BUN still quite high at 62. Continue IV fluid hydration.  2. Hypokalemia Likely secondary to nutritional deficiencies as well as increased urine output - Serum potassium normal now at 4.1. Continue to monitor.  3. Acidosis -  Serum bicarbonate quite low. Patient has been started on oral supplementation however we will start the patient on sodium bicarbonate drip given severity of acidosis.  4. Hypocalcemia Calcium up to 7.2. Continue to monitor.   LOS: 4 [name] 8/14/201710:56 AM

## 2016-04-16 NOTE — Evaluation (Signed)
Physical Therapy Evaluation Patient Details Name: [name] MRN: [id number] DOB: [birth year] Today's Date: 04/16/2016   History of Present Illness  admitted from cancer center secondary to acute kidney injury and severe dehydration secondary to diarrhea/vomiting (related to recent chemo treatment, last received 04/05/16).  Clinical Impression  Upon evaluation, patient alert and oriented to all information; follows commands and demonstrates good insight/safety awareness.  Bilat UE/LE strength generally weak and deconditioned, requiring assist for all anti-gravity movement of LEs (unable to indep lift over edge of bed).  Currently requiring mod assist for bed mobility; min assist +1 for sit/stand, basic transfers and short-distance gait (15') with RW.  Broad BOS with staggering steps, slightly impulsive; but no overt LOB.  Notably SOB with minimal exertion; HR elevation to 140s, sats maintain >98% on RA. Significant deficits in cardiopulmonary endurance noted; lacks ability to consistently complete gait trials of household distances or ability to safely negotiate stairs required for entry/exit. Would benefit from skilled PT to address above deficits and promote optimal return to PLOF; recommend transition to STR upon discharge from acute hospitalization.  Patient preferring discharge home with HHPT, but agreeable to leave STR as option with encouragement from therapist.     Follow Up Recommendations SNF    Equipment Recommendations  Rolling walker with 5" wheels    Recommendations for Other Services       Precautions / Restrictions Precautions Precautions: Fall Precaution Comments: L LQ colostomy, R chest port Restrictions Weight Bearing Restrictions: No      Mobility  Bed Mobility Overal bed mobility: Needs Assistance Bed Mobility: Supine to Sit;Sit to Supine     Supine to sit: Mod assist Sit to supine: Mod assist   General bed mobility comments: assist for truncal  elevation with supine to sit and for LE elevation over edge of bed with sit to supine  Transfers Overall transfer level: Needs assistance Equipment used: Rolling walker (2 wheeled) Transfers: Sit to/from Stand Sit to Stand: Min assist;Min guard            Ambulation/Gait Ambulation/Gait assistance: Min guard;Min assist Ambulation Distance (Feet): 15 Feet Assistive device: Rolling walker (2 wheeled)       General Gait Details: broad BOS, staggering steps; slightly impulsive.  Notable SOB with minimal distance/exertion; HR elevation to 140s with activity, but sats >98% on RA.  Stairs            Wheelchair Mobility    Modified Rankin (Stroke Patients Only)       Balance Overall balance assessment: Needs assistance Sitting-balance support: No upper extremity supported;Feet supported Sitting balance-Leahy Scale: Good     Standing balance support: Bilateral upper extremity supported Standing balance-Leahy Scale: Fair                               Pertinent Vitals/Pain Pain Assessment: No/denies pain    Home Living Family/patient expects to be discharged to:: Private residence Living Arrangements: Spouse/significant other;Children Available Help at Discharge: Family;Available 24 hours/day Type of Home: House Home Access: Stairs to enter Entrance Stairs-Rails: Right Entrance Stairs-Number of Steps: 5 Home Layout: One level        Prior Function Level of Independence: Independent         Comments: Indep for ADLs, household mobility without assist device.  Denies fall history.     Hand Dominance        Extremity/Trunk Assessment   Upper Extremity Assessment: Generalized weakness  Lower Extremity Assessment: Generalized weakness (grossly 3- to 3/5 throughout bilat LEs)         Communication   Communication: No difficulties  Cognition Arousal/Alertness: Awake/alert Behavior During Therapy: WFL for tasks  assessed/performed Overall Cognitive Status: Within Functional Limits for tasks assessed                      General Comments      Exercises Other Exercises Other Exercises: Toilet transfer, ambulatory with RW, cga/min assist; sit/stand from [facility], cga/min assist; standing balance for hygiene and clothing management with RW, cga/min assist.  Maintains broad BOS      Assessment/Plan    PT Assessment Patient needs continued PT services  PT Diagnosis Difficulty walking;Generalized weakness   PT Problem List Decreased strength;Decreased activity tolerance;Decreased balance;Decreased mobility;Cardiopulmonary status limiting activity;Obesity  PT Treatment Interventions DME instruction;Gait training;Stair training;Therapeutic activities;Functional mobility training;Therapeutic exercise;Balance training;Patient/family education   PT Goals (Current goals can be found in the Care Plan section) Acute Rehab PT Goals Patient Stated Goal: to go home and have therapy come to the house PT Goal Formulation: With patient Time For Goal Achievement: 04/30/16 Potential to Achieve Goals: Good    Frequency Min 2X/week   Barriers to discharge Decreased caregiver support      Co-evaluation               End of Session Equipment Utilized During Treatment: Gait belt Activity Tolerance: Patient tolerated treatment well Patient left: in bed;with call bell/phone within reach;with chair alarm set Nurse Communication: Mobility status         Time: 1550-1610 PT Time Calculation (min) (ACUTE ONLY): 20 min   Charges:   PT Evaluation $PT Eval Low Complexity: 1 Procedure PT Treatments $Therapeutic Activity: 8-22 mins   PT G Codes:        Blakeley Scheier H. Owens Shark, PT, DPT, NCS 04/16/16, 4:19 PM [phone]

## 2016-04-16 NOTE — Progress Notes (Signed)
Dr Marcille Blanco notified of critical platelets 27. Acknowledged.

## 2016-04-16 NOTE — Progress Notes (Addendum)
[name] at [place] NAME: [name]    MR#:  [id number]  DATE OF BIRTH:  [birth year]  SUBJECTIVE:  Feels a bit better than y'day Colostomy output decreasing now since she was started o SQ octreotide  REVIEW OF SYSTEMS:   Review of Systems  Constitutional: Positive for malaise/fatigue. Negative for chills, fever and weight loss.  HENT: Negative for ear discharge, ear pain and nosebleeds.   Eyes: Negative for blurred vision, pain and discharge.  Respiratory: Negative for sputum production, shortness of breath, wheezing and stridor.   Cardiovascular: Negative for chest pain, palpitations, orthopnea and PND.  Gastrointestinal: Positive for diarrhea. Negative for abdominal pain, nausea and vomiting.  Genitourinary: Negative for frequency and urgency.  Musculoskeletal: Negative for back pain and joint pain.  Neurological: Positive for weakness. Negative for sensory change, speech change and focal weakness.  Psychiatric/Behavioral: Negative for depression and hallucinations. The patient is not nervous/anxious.   All other systems reviewed and are negative.  Tolerating Diet:yes Tolerating [id number]   DRUG ALLERGIES:  No Known Allergies  VITALS:  Blood pressure 117/62, pulse (!) 107, temperature 98.5 F (36.9 C), resp. rate 20, height 5\' 2"  (1.575 m), weight 158 lb 12.8 oz (72 kg), SpO2 100 %.  PHYSICAL EXAMINATION:   Physical Exam  GENERAL:  70 y.o.-year-old patient lying in the bed with no acute distress.  EYES: Pupils equal, round, reactive to light and accommodation. No scleral icterus. Extraocular muscles intact.  HEENT: Head atraumatic, normocephalic. Oropharynx and nasopharynx clear.  NECK:  Supple, no jugular venous distention. No thyroid enlargement, no tenderness.  LUNGS: Normal breath sounds bilaterally, no wheezing, rales, rhonchi. No use of accessory muscles of respiration.  CARDIOVASCULAR: S1, S2 normal. No  murmurs, rubs, or gallops.  ABDOMEN: Soft, nontender, nondistended. Bowel sounds present. No organomegaly or mass. Colostomy+ EXTREMITIES: No cyanosis, clubbing or edema b/l.    NEUROLOGIC: Cranial nerves II through XII are intact. No focal Motor or sensory deficits b/l.   PSYCHIATRIC:  patient is alert and oriented x 3.  SKIN: No obvious rash, lesion, or ulcer.   LABORATORY PANEL:  CBC  Recent Labs Lab 04/16/16 0436  WBC 3.1*  HGB 9.0*  HCT 27.1*  PLT 27*    Chemistries   Recent Labs Lab 04/16/16 0436  NA 139  K 4.1  CL 124*  CO2 10*  GLUCOSE 81  BUN 62*  CREATININE 1.80*  CALCIUM 7.2*  MG 1.9   Cardiac Enzymes No results for input(s): TROPONINI in the last 168 hours. RADIOLOGY:  No results found. ASSESSMENT AND PLAN:  * Acute kidney injury due to severe dehydration from diarrhea and decreased oral intake/ vomiting Replace IV fluids aggressively.  -replete electrolytes Consult with nephrology appreciated. 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 At this time due to prolonged QT on EKG we  held off on daily shots. Consult GI apprecited -Nothing else has helped pt with slowing diarrhea other than octreotide. HEr QI interval  Is stable and so SQ octreotide resumed again while in the hospital and cont cardiac monitoring----pt's out has decreased since    * Prolonged QT is likely due to hypomagnesemia. Replaced magnesium.  -Had run of SVT last nite-resolved  * Endometrial carcinoma Patient follows with Dr. Mike Berger as outpatient  * Neutropenia - due to chemotherapy Neutropenic precautions.   * Hypocalcemia Corrected calcium 7.7  Getting tums daily  * DVT prophylaxis with SCD. Heparin d/ced  CM for d/c planning Case discussed with Care Management/Social Worker. Management plans discussed with the patient, family and they are  in agreement.  CODE STATUS: full   TOTAL TIME TAKING CARE OF THIS PATIENT:30 minutes.  >50% time spent on counselling and coordination of care  POSSIBLE D/C IN 1-2 DAYS, DEPENDING ON CLINICAL CONDITION.  Note: This dictation was prepared with Dragon dictation along with smaller phrase technology. Any transcriptional errors that result from this process are unintentional.  [name] M.D on 04/16/2016 at 1:24 PM  Between 7am to 6pm - Pager - [phone]  After 6pm go to [email or web address] - password [email or web address] [facility]  [place] [facility]  Office  [phone]  CC: Primary care physician; Heather Miu, MD

## 2016-04-16 NOTE — Plan of Care (Signed)
Problem: Bowel/Gastric: Goal: Will not experience complications related to bowel motility Outcome: Progressing Colostomy output has slowed down. Pt has not been experiencing N/V.

## 2016-04-16 NOTE — Progress Notes (Signed)
MEDICATION RELATED CONSULT NOTE - INITIAL   Pharmacy Consult for electrolyte monitoring/replacement Indication: hypokalemia  No Known Allergies  Patient Measurements: Height: 5\' 2"  (157.5 cm) Weight: 158 lb 12.8 oz (72 kg) IBW/kg (Calculated) : 50.1  Vital Signs: Temp: 97.5 F (36.4 C) (08/14 0509) Temp Source: Oral (08/14 0509) BP: 114/62 (08/14 0509) Pulse Rate: 92 (08/14 0509) Intake/Output from previous day: 08/13 0701 - 08/14 0700 In: [id number] [P.O.:960.5; I.V.:3390.5; IV [id number] Out: 2100 [Urine:250; [id number] Intake/Output from this shift: No intake/output data recorded.  Labs:  Recent Labs  04/14/16 0414 04/15/16 0519 04/16/16 0436  WBC 1.0* 2.6* 3.1*  HGB 9.7* 9.0* 9.0*  HCT 28.9* 26.7* 27.1*  PLT 61* 42* 27*  CREATININE 2.43* 2.13* 1.80*  MG 1.8 1.6* 1.9  PHOS  --  2.8  --    Estimated Creatinine Clearance: 27.4 mL/min (by C-G formula based on SCr of 1.8 mg/dL).  Assessment: Pharmacy consulted to monitor and replace electrolytes in this 70 year old female with hypokalemia. Patient is receiving NS20K at 125 ml/hr.   Goal of Therapy:  Electrolytes within normal limits  Plan:  K= 4.1, Mag= 1.9.  No supplementation needed at this time.  Will f/u AM labs.   Pernell Dupre, PharmD Clinical Pharmacist 04/16/2016,7:43 AM

## 2016-04-16 NOTE — Care Management (Signed)
Attempted assessment.  Patient asleep at this time.  PT consult pending

## 2016-04-17 DIAGNOSIS — N289 Disorder of kidney and ureter, unspecified: Secondary | ICD-10-CM

## 2016-04-17 LAB — TYPE AND SCREEN
ABO/RH(D): O POS
Antibody Screen: NEGATIVE

## 2016-04-17 LAB — CBC WITH DIFFERENTIAL/PLATELET
Basophils Absolute: 0 10*3/uL (ref 0–0.1)
Basophils Relative: 0 %
Eosinophils Absolute: 0 10*3/uL (ref 0–0.7)
Eosinophils Relative: 0 %
HCT: 24.6 % — ABNORMAL LOW (ref 35.0–47.0)
Hemoglobin: 8.3 g/dL — ABNORMAL LOW (ref 12.0–16.0)
Lymphocytes Relative: 14 %
Lymphs Abs: 0.4 10*3/uL — ABNORMAL LOW (ref 1.0–3.6)
MCH: 32.2 pg (ref 26.0–34.0)
MCHC: 33.7 g/dL (ref 32.0–36.0)
MCV: 95.7 fL (ref 80.0–100.0)
Monocytes Absolute: 0.2 10*3/uL (ref 0.2–0.9)
Monocytes Relative: 7 %
Neutro Abs: 2.2 10*3/uL (ref 1.4–6.5)
Neutrophils Relative %: 79 %
Platelets: 16 10*3/uL — CL (ref 150–440)
RBC: 2.57 MIL/uL — ABNORMAL LOW (ref 3.80–5.20)
RDW: 16.4 % — ABNORMAL HIGH (ref 11.5–14.5)
WBC: 2.8 10*3/uL — ABNORMAL LOW (ref 3.6–11.0)

## 2016-04-17 LAB — BASIC METABOLIC PANEL
Anion gap: 10 (ref 5–15)
BUN: 49 mg/dL — AB (ref 6–20)
CHLORIDE: 114 mmol/L — AB (ref 101–111)
CO2: 13 mmol/L — AB (ref 22–32)
CREATININE: 1.53 mg/dL — AB (ref 0.44–1.00)
Calcium: 6.9 mg/dL — ABNORMAL LOW (ref 8.9–10.3)
GFR calc Af Amer: 39 mL/min — ABNORMAL LOW (ref >60)
GFR calc non Af Amer: 34 mL/min — ABNORMAL LOW (ref >60)
Glucose, Bld: 119 mg/dL — ABNORMAL HIGH (ref 65–99)
Potassium: 3.6 mmol/L (ref 3.5–5.1)
Sodium: 137 mmol/L (ref 135–145)

## 2016-04-17 LAB — POTASSIUM
POTASSIUM: 3 mmol/L — AB (ref 3.5–5.1)
POTASSIUM: 3.4 mmol/L — AB (ref 3.5–5.1)

## 2016-04-17 LAB — MAGNESIUM
MAGNESIUM: 1.4 mg/dL — AB (ref 1.7–2.4)
Magnesium: 2.1 mg/dL (ref 1.7–2.4)

## 2016-04-17 MED ORDER — MAGNESIUM OXIDE 400 (241.3 MG) MG PO TABS: 400.0000 mg | ORAL_TABLET | Freq: Three times a day (TID) | ORAL | Status: DC

## 2016-04-17 MED ORDER — POTASSIUM CHLORIDE CRYS ER 20 MEQ PO TBCR
40.0000 meq | EXTENDED_RELEASE_TABLET | Freq: Once | ORAL | Status: AC
  Administered 2016-04-17: 40 meq via ORAL
  Filled 2016-04-17: qty 2

## 2016-04-17 MED ORDER — POTASSIUM CHLORIDE 10 MEQ/100ML IV SOLN
10.0000 meq | INTRAVENOUS | Status: AC
  Administered 2016-04-17 (×3): 10 meq via INTRAVENOUS
  Filled 2016-04-17 (×3): qty 100

## 2016-04-17 MED ORDER — MAGNESIUM OXIDE 400 (241.3 MG) MG PO TABS
400.0000 mg | ORAL_TABLET | Freq: Two times a day (BID) | ORAL | Status: DC
  Administered 2016-04-17 – 2016-04-20 (×6): 400 mg via ORAL
  Filled 2016-04-17 (×6): qty 1

## 2016-04-17 MED ORDER — POTASSIUM CHLORIDE CRYS ER 20 MEQ PO TBCR
20.0000 meq | EXTENDED_RELEASE_TABLET | Freq: Once | ORAL | Status: AC
  Administered 2016-04-17: 20 meq via ORAL
  Filled 2016-04-17: qty 1

## 2016-04-17 MED ORDER — MAGNESIUM SULFATE 4 GM/100ML IV SOLN
4.0000 g | Freq: Once | INTRAVENOUS | Status: AC
  Administered 2016-04-17: 4 g via INTRAVENOUS
  Filled 2016-04-17: qty 100

## 2016-04-17 NOTE — Progress Notes (Addendum)
[place] at [place] NAME: [name]    MR#:  [id number]  DATE OF BIRTH:  09-28-1945  SUBJECTIVE:  Feels a bit better than y'day Colostomy output decreasing now since she was started o SQ octreotide utput 700 cc/24 hour  REVIEW OF SYSTEMS:   Review of Systems  Constitutional: Positive for malaise/fatigue. Negative for chills, fever and weight loss.  HENT: Negative for ear discharge, ear pain and nosebleeds.   Eyes: Negative for blurred vision, pain and discharge.  Respiratory: Negative for sputum production, shortness of breath, wheezing and stridor.   Cardiovascular: Negative for chest pain, palpitations, orthopnea and PND.  Gastrointestinal: Positive for diarrhea. Negative for abdominal pain, nausea and vomiting.  Genitourinary: Negative for frequency and urgency.  Musculoskeletal: Negative for back pain and joint pain.  Neurological: Positive for weakness. Negative for sensory change, speech change and focal weakness.  Psychiatric/Behavioral: Negative for depression and hallucinations. The patient is not nervous/anxious.   All other systems reviewed and are negative.  Tolerating Diet:yes Tolerating [id number] STR but declines  DRUG ALLERGIES:  No Known Allergies  VITALS:  Blood pressure (!) 129/106, pulse 96, temperature 98.4 F (36.9 C), temperature source Oral, resp. rate 18, height 5\' 2"  (1.575 m), weight 158 lb 12.8 oz (72 kg), SpO2 (!) 89 %.  PHYSICAL EXAMINATION:   Physical Exam  GENERAL:  70 y.o.-year-old patient lying in the bed with no acute distress.  EYES: Pupils equal, round, reactive to light and accommodation. No scleral icterus. Extraocular muscles intact.  HEENT: Head atraumatic, normocephalic. Oropharynx and nasopharynx clear.  NECK:  Supple, no jugular venous distention. No thyroid enlargement, no tenderness.  LUNGS: Normal breath sounds bilaterally, no wheezing, rales, rhonchi. No use of  accessory muscles of respiration.  CARDIOVASCULAR: S1, S2 normal. No murmurs, rubs, or gallops.  ABDOMEN: Soft, nontender, nondistended. Bowel sounds present. No organomegaly or mass. Colostomy+ EXTREMITIES: No cyanosis, clubbing or edema b/l.    NEUROLOGIC: Cranial nerves II through XII are intact. No focal Motor or sensory deficits b/l.   PSYCHIATRIC:  patient is alert and oriented x 3.  SKIN: No obvious rash, lesion, or ulcer.   LABORATORY PANEL:  CBC  Recent Labs Lab 04/17/16 0500  WBC 2.8*  HGB 8.3*  HCT 24.6*  PLT 16*    Chemistries   Recent Labs Lab 04/16/16 0436 04/17/16 0500  NA 139  --   K 4.1 3.0*  CL 124*  --   CO2 10*  --   GLUCOSE 81  --   BUN 62*  --   CREATININE 1.80*  --   CALCIUM 7.2*  --   MG 1.9 1.4*   Cardiac Enzymes No results for input(s): TROPONINI in the last 168 hours. RADIOLOGY:  No results found. ASSESSMENT AND PLAN:  * Acute kidney injury due to severe dehydration from diarrhea and decreased oral intake/ vomiting Replace IV fluids aggressively.  -replete electrolytes Consult with nephrology appreciated. 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 At this time due to prolonged QT on EKG we  held off on daily shots. Consult GI apprecited -Nothing else has helped pt with slowing diarrhea other than octreotide. HEr QI interval  Is stable and so SQ octreotide resumed again while in the hospital and cont cardiac monitoring----pt's out has decreased since    * Prolonged QT is likely due to hypomagnesemia. Replaced  magnesium.  -Had run of SVT -resolved  * Endometrial carcinoma Patient follows with Dr. Mike Gip as outpatient  * Neutropenia - due to chemotherapy Neutropenic precautions.   * Hypocalcemia/hypomagnesima/hypokalemia -repelet oral and IV Getting tums daily  * DVT prophylaxis  SCD. Heparin onhlod  due to low plt  CM for d/c planning. Pt wants to go home with HHPT Case discussed with Care Management/Social Worker. Management plans discussed with the patient, family and they are in agreement.  CODE STATUS: full   TOTAL TIME TAKING CARE OF THIS PATIENT:30 minutes.  >50% time spent on counselling and coordination of care  POSSIBLE D/C IN 1-2 DAYS, DEPENDING ON CLINICAL CONDITION.  Note: This dictation was prepared with Dragon dictation along with smaller phrase technology. Any transcriptional errors that result from this process are unintentional.  Noheli Melder M.D on 04/17/2016 at 12:19 PM  Between 7am to 6pm - Pager - [phone]  After 6pm go to [email or web address] - password [email or web address] [facility]  [place] [facility]  Office  [phone]  CC: Primary care physician; [name], MD

## 2016-04-17 NOTE — Care Management (Signed)
Patient admitted from home with AKI.  Patient lives at home with her husband.  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  Patient states that her adult daughter who is a Marine scientist also lives in the home.  Husband provides transportation.  Obtains medications from [facility] in [place].  Denies any issues obtaining medication. Has RW in the home.   PT has assessed patient and recommended SNF.  Patient has declined.  MD aware.  Appropriate for home health PT, RN, and aide.  Patient provided with agency preference list.  Patient states that she does not have a preference.  I have made a heads up referral to [place] with [facility].  RNCM following.

## 2016-04-17 NOTE — Care Management Important Message (Signed)
Important Message  Patient Details  Name: [name] MRN: [id number] Date of Birth: 20-Jul-1946   Medicare Important Message Given:  Yes    Beverly Sessions, RN 04/17/2016, 2:26 PM

## 2016-04-17 NOTE — Telephone Encounter (Signed)
Tried contacting pt again. Was advised by her husband she is currently in the hospital. Requested him to have her contact me if she was able to.

## 2016-04-17 NOTE — Progress Notes (Signed)
MEDICATION RELATED CONSULT NOTE - follow up Pharmacy Consult for electrolyte monitoring/replacement Indication: hypokalemia  No Known Allergies  Patient Measurements: Height: 5\' 2"  (157.5 cm) Weight: 158 lb 12.8 oz (72 kg) IBW/kg (Calculated) : 50.1  Vital Signs: Temp: 98.1 F (36.7 C) (08/15 1227) Temp Source: Oral (08/15 1227) BP: 106/65 (08/15 1227) Pulse Rate: 93 (08/15 1227) Intake/Output from previous day: 08/14 0701 - 08/15 0700 In: 2265.7 [I.V.:2265.7] Out: 2000 [Urine:1250; Stool:750] Intake/Output from this shift: No intake/output data recorded.  Labs:  Recent Labs  04/15/16 0519 04/16/16 0436 04/17/16 0500 04/17/16 1253 04/17/16 1819  WBC 2.6* 3.1* 2.8*  --   --   HGB 9.0* 9.0* 8.3*  --   --   HCT 26.7* 27.1* 24.6*  --   --   PLT 42* 27* 16*  --   --   CREATININE 2.13* 1.80*  --  1.53*  --   MG 1.6* 1.9 1.4*  --  2.1  PHOS 2.8  --   --   --   --    Lab Results  Component Value Date   K 3.4 (L) 04/17/2016   Estimated Creatinine Clearance: 32.3 mL/min (by C-G formula based on SCr of 1.53 mg/dL).  Assessment: Pharmacy consulted to monitor and replace electrolytes in this 70 year old female with hypokalemia. Patient was receiving NS20K at 125 ml/hr- this was discontinued on 8/14.  Goal of Therapy:  Electrolytes within normal limits  Plan:  8/15 AM K= 3.0, Mag= 1.4.    (SCr(8/14)= 1.80,  crcl =27.4) Will order Magnesium 4 gram IV x 1 and Potassium Chloride 15meq IV x 3 (80meq total).  Will recheck K+/Mag at 1800.  Will also f/u AM labs.    Discussed electrolyte supplementation with MD. Patient with high ostomy output. Received 74mEq KCl yesterday via fluids. MD added oral magnesium oxide - will start slowly and monitor due to laxative effects of higher doses of oral magnesium. Will also give oral potassium 13mEq x 1 today. Recheck with PM labs.  Rexene Edison, PharmD Clinical Pharmacist    [phone]:  K 3.4, Mag 2.1, SCr 1.53, CrCl 32.3 ml/min K 3.0  --> 3.6 midday now back down to 3.4 with 30 mEq IV and 20 mEq PO of potassium Will order KCl 40 mEq PO x1  (pt ordered regular diet) No further mag supp needed at this time.  Follow labs in AM  Rayna Sexton, PharmD, BCPS Clinical Pharmacist 04/17/2016 7:34 PM

## 2016-04-17 NOTE — Plan of Care (Signed)
Problem: Nutrition: Goal: Adequate nutrition will be maintained Outcome: Not Progressing Poor appetite

## 2016-04-17 NOTE — Progress Notes (Addendum)
MEDICATION RELATED CONSULT NOTE - follow up Pharmacy Consult for electrolyte monitoring/replacement Indication: hypokalemia  No Known Allergies  Patient Measurements: Height: 5\' 2"  (157.5 cm) Weight: 158 lb 12.8 oz (72 kg) IBW/kg (Calculated) : 50.1  Vital Signs: Temp: 98.4 F (36.9 C) (08/15 0457) Temp Source: Oral (08/15 0457) BP: 129/106 (08/15 0615) Pulse Rate: 96 (08/15 0615) Intake/Output from previous day: 08/14 0701 - 08/15 0700 In: 2265.7 [I.V.:2265.7] Out: 2000 [Urine:1250; Stool:750] Intake/Output from this shift: No intake/output data recorded.  Labs:  Recent Labs  04/15/16 0519 04/16/16 0436 04/17/16 0500  WBC 2.6* 3.1* 2.8*  HGB 9.0* 9.0* 8.3*  HCT 26.7* 27.1* 24.6*  PLT 42* 27* 16*  CREATININE 2.13* 1.80*  --   MG 1.6* 1.9 1.4*  PHOS 2.8  --   --    Lab Results  Component Value Date   K 3.0 (L) 04/17/2016   Estimated Creatinine Clearance: 27.4 mL/min (by C-G formula based on SCr of 1.8 mg/dL).  Assessment: Pharmacy consulted to monitor and replace electrolytes in this 70 year old female with hypokalemia. Patient was receiving NS20K at 125 ml/hr- this was discontinued on 8/14.  Goal of Therapy:  Electrolytes within normal limits  Plan:  K= 3.0, Mag= 1.4.    (SCr(8/14)= 1.80,  crcl =27.4) Will order Magnesium 4 gram IV x 1 and Potassium Chloride 47meq IV x 3 (42meq total).  Will recheck K+/Mag at 1800.  Will also f/u AM labs.   Noralee Space, PharmD Clinical Pharmacist 04/17/2016,7:43 AM  Discussed electrolyte supplementation with MD. Patient with high ostomy output. Received 6mEq KCl yesterday via fluids. MD added oral magnesium oxide - will start slowly and monitor due to laxative effects of higher doses of oral magnesium. Will also give oral potassium 13mEq x 1 today. Recheck with PM labs.  Rexene Edison, PharmD Clinical Pharmacist  04/17/2016 10:37 AM

## 2016-04-17 NOTE — Progress Notes (Signed)
Subjective:  No new renal function testing today. Potassium down to 3.0. Platelets also continued to drop and are currently 16,000. Stool output did drop yesterday.   Objective:  Vital signs in last 24 hours:  Temp:  [97.4 F (36.3 C)-98.5 F (36.9 C)] 98.4 F (36.9 C) (08/15 0457) Pulse Rate:  [95-144] 96 (08/15 0615) Resp:  [17-20] 18 (08/15 0457) BP: (71-136)/(46-106) 129/106 (08/15 0615) SpO2:  [89 %-100 %] 89 % (08/15 0615)  Weight change:  Filed Weights   04/13/16 0500 04/14/16 0500 04/16/16 0619  Weight: 67.3 kg (148 lb 4.8 oz) 68.4 kg (150 lb 12.8 oz) 72 kg (158 lb 12.8 oz)    Intake/Output:    Intake/Output Summary (Last 24 hours) at 04/17/16 1138 Last data filed at 04/17/16 1050  Gross per 24 hour  Intake          1855.95 ml  Output             1475 ml  Net           380.95 ml     Physical Exam: General: No acute distress, laying in the bed   HEENT Moist oral mucous membranes   Neck Supple   Pulm/lungs Normal breathing effort, mild diffuse wheezing upon auscultation   CVS/Heart No rub or gallop   Abdomen:  Soft, nontender, nondistended, colostomy in place   Extremities: No peripheral edema   Neurologic: Alert, oriented   Skin: Scaly lesions over bilateral legs           Basic Metabolic Panel:   Recent Labs Lab 04/12/16 0843 04/12/16 1419 04/13/16 0500 04/14/16 0414 04/14/16 2008 04/15/16 0519 04/16/16 0436 04/17/16 0500  NA 134*  --  133* 134*  --  139 139  --   K 3.7  --  3.0* 2.8* 3.2* 3.3* 4.1 3.0*  CL 93*  --  104 112*  --  120* 124*  --   CO2 21*  --  18* 14*  --  11* 10*  --   GLUCOSE 160*  --  118* 84  --  88 81  --   BUN 86*  --  79* 72*  --  64* 62*  --   CREATININE 4.35*  --  3.11* 2.43*  --  2.13* 1.80*  --   CALCIUM 6.4*  --  5.9* 6.3*  --  6.8* 7.2*  --   MG 1.4*  --  2.4 1.8  --  1.6* 1.9 1.4*  PHOS  --  5.9*  --   --   --  2.8  --   --      CBC:  Recent Labs Lab 04/12/16 0843 04/13/16 0500 04/14/16 0414  04/15/16 0519 04/16/16 0436 04/17/16 0500  WBC 0.9* 0.8* 1.0* 2.6* 3.1* 2.8*  NEUTROABS 0.2*  --  0.5* 1.9 2.2 2.2  HGB 12.2 10.7* 9.7* 9.0* 9.0* 8.3*  HCT 36.6 31.4* 28.9* 26.7* 27.1* 24.6*  MCV 95.0 97.0 97.4 96.8 98.1 95.7  PLT 192 102* 61* 42* 27* 16*      Microbiology:  Recent Results (from the past 720 hour(s))  C difficile quick scan w PCR reflex     Status: None   Collection Time: 03/25/16  4:53 PM  Result Value Ref Range Status   C Diff antigen NEGATIVE NEGATIVE Final   C Diff toxin NEGATIVE NEGATIVE Final   C Diff interpretation No C. difficile detected.  Final  Urine culture     Status: Abnormal  Collection Time: 03/26/16  2:28 PM  Result Value Ref Range Status   Specimen Description URINE, CATHETERIZED  Final   Special Requests NONE  Final   Culture MULTIPLE SPECIES PRESENT, SUGGEST RECOLLECTION (A)  Final   Report Status 03/27/2016 FINAL  Final  Gastrointestinal Panel by PCR , Stool     Status: None   Collection Time: 03/26/16  4:47 PM  Result Value Ref Range Status   Campylobacter species NOT DETECTED NOT DETECTED Final   Plesimonas shigelloides NOT DETECTED NOT DETECTED Final   Salmonella species NOT DETECTED NOT DETECTED Final   Yersinia enterocolitica NOT DETECTED NOT DETECTED Final   Vibrio species NOT DETECTED NOT DETECTED Final   Vibrio cholerae NOT DETECTED NOT DETECTED Final   Enteroaggregative E coli (EAEC) NOT DETECTED NOT DETECTED Final   Enteropathogenic E coli (EPEC) NOT DETECTED NOT DETECTED Final   Enterotoxigenic E coli (ETEC) NOT DETECTED NOT DETECTED Final   Shiga like toxin producing E coli (STEC) NOT DETECTED NOT DETECTED Final   E. coli O157 NOT DETECTED NOT DETECTED Final   Shigella/Enteroinvasive E coli (EIEC) NOT DETECTED NOT DETECTED Final   Cryptosporidium NOT DETECTED NOT DETECTED Final   Cyclospora cayetanensis NOT DETECTED NOT DETECTED Final   Entamoeba histolytica NOT DETECTED NOT DETECTED Final   Giardia lamblia NOT  DETECTED NOT DETECTED Final   Adenovirus F40/41 NOT DETECTED NOT DETECTED Final   Astrovirus NOT DETECTED NOT DETECTED Final   Norovirus GI/GII NOT DETECTED NOT DETECTED Final   Rotavirus A NOT DETECTED NOT DETECTED Final   Sapovirus (I, II, IV, and V) NOT DETECTED NOT DETECTED Final  Urine culture     Status: None   Collection Time: 03/28/16  8:52 AM  Result Value Ref Range Status   Specimen Description URINE, CATHETERIZED  Final   Special Requests NONE  Final   Culture NO GROWTH Performed at [facility]   Final   Report Status 03/29/2016 FINAL  Final    Coagulation Studies: No results for input(s): LABPROT, INR in the last 72 hours.  Urinalysis: No results for input(s): COLORURINE, LABSPEC, PHURINE, GLUCOSEU, HGBUR, BILIRUBINUR, KETONESUR, PROTEINUR, UROBILINOGEN, NITRITE, LEUKOCYTESUR in the last 72 hours.  Invalid input(s): APPERANCEUR    Imaging: No results found.   Medications:   .  sodium bicarbonate 150 mEq in sterile water 1000 mL infusion 100 mL/hr at 04/16/16 2343   . calcium carbonate  1 tablet Oral TID WC  . feeding supplement  1 Container Oral TID BM  . loperamide  2 mg Oral Q6H  . magnesium oxide  400 mg Oral BID  . octreotide  100 mcg Subcutaneous TID  . potassium chloride  10 mEq Intravenous Q1 Hr x 3  . potassium chloride  20 mEq Oral Once  . sodium bicarbonate  1,300 mg Oral BID  . sodium chloride flush  3 mL Intravenous Q12H   acetaminophen **OR** acetaminophen, albuterol, diphenoxylate-atropine, LORazepam, ondansetron **OR** ondansetron (ZOFRAN) IV, oxyCODONE, phenol, promethazine  Assessment/ Plan:  70 y.o. female with medical problems of Endometrial cancer undergoing chemotherapy, history of diverticulitis, colostomy, who was admitted to [facility] on 04/12/2016 for evaluation of increased diarrhea x 3 days, weakness, acute renal failure  1. Acute renal failure, nonoliguric Likely secondary to severe dehydration from increased colostomy  output Baseline serum creatinine is 0.98 from August 3 Admission creatinine 4.35 -  We will reorder renal function testing for today.  Continue IV fluid hydration for now.  2. Hypokalemia Likely secondary to nutritional  deficiencies as well as increased urine output - Potassium down to 3.0. She is being administered both by mouth and IV potassium chloride at the moment.  3. Acidosis -  Continue sodium bicarbonate drip. Follow-up serum bicarbonate level today.  4. Hypocalcemia Awaiting repeat serum calcium today.   LOS: 5 [name] 8/15/201711:38 AM

## 2016-04-18 ENCOUNTER — Other Ambulatory Visit: Payer: Self-pay | Admitting: Hematology and Oncology

## 2016-04-18 LAB — CBC WITH DIFFERENTIAL/PLATELET
Basophils Absolute: 0 10*3/uL (ref 0–0.1)
Basophils Relative: 0 %
Eosinophils Absolute: 0 10*3/uL (ref 0–0.7)
Eosinophils Relative: 0 %
HCT: 21.9 % — ABNORMAL LOW (ref 35.0–47.0)
Hemoglobin: 7.2 g/dL — ABNORMAL LOW (ref 12.0–16.0)
Lymphocytes Relative: 25 %
Lymphs Abs: 0.5 10*3/uL — ABNORMAL LOW (ref 1.0–3.6)
MCH: 31.8 pg (ref 26.0–34.0)
MCHC: 33 g/dL (ref 32.0–36.0)
MCV: 96.2 fL (ref 80.0–100.0)
Monocytes Absolute: 0.3 10*3/uL (ref 0.2–0.9)
Monocytes Relative: 12 %
Neutro Abs: 1.3 10*3/uL — ABNORMAL LOW (ref 1.4–6.5)
Neutrophils Relative %: 63 %
Platelets: 12 10*3/uL — CL (ref 150–440)
RBC: 2.28 MIL/uL — ABNORMAL LOW (ref 3.80–5.20)
RDW: 16.4 % — ABNORMAL HIGH (ref 11.5–14.5)
WBC: 2.1 10*3/uL — ABNORMAL LOW (ref 3.6–11.0)

## 2016-04-18 LAB — MAGNESIUM: MAGNESIUM: 1.9 mg/dL (ref 1.7–2.4)

## 2016-04-18 LAB — PHOSPHORUS
PHOSPHORUS: 1.2 mg/dL — AB (ref 2.5–4.6)
Phosphorus: 1.3 mg/dL — ABNORMAL LOW (ref 2.5–4.6)

## 2016-04-18 LAB — BASIC METABOLIC PANEL
Anion gap: 7 (ref 5–15)
BUN: 43 mg/dL — AB (ref 6–20)
CHLORIDE: 110 mmol/L (ref 101–111)
CO2: 20 mmol/L — ABNORMAL LOW (ref 22–32)
CREATININE: 1.35 mg/dL — AB (ref 0.44–1.00)
Calcium: 6.2 mg/dL — CL (ref 8.9–10.3)
GFR calc Af Amer: 45 mL/min — ABNORMAL LOW (ref >60)
GFR calc non Af Amer: 39 mL/min — ABNORMAL LOW (ref >60)
GLUCOSE: 116 mg/dL — AB (ref 65–99)
POTASSIUM: 3.2 mmol/L — AB (ref 3.5–5.1)
SODIUM: 137 mmol/L (ref 135–145)

## 2016-04-18 LAB — POTASSIUM: Potassium: 3.8 mmol/L (ref 3.5–5.1)

## 2016-04-18 LAB — ALBUMIN: Albumin: 1.2 g/dL — ABNORMAL LOW (ref 3.5–5.0)

## 2016-04-18 MED ORDER — POTASSIUM PHOSPHATE MONOBASIC 500 MG PO TABS
1000.0000 mg | ORAL_TABLET | ORAL | Status: DC
  Filled 2016-04-18 (×2): qty 2

## 2016-04-18 MED ORDER — POTASSIUM & SODIUM PHOSPHATES 280-160-250 MG PO PACK
1.0000 | PACK | ORAL | Status: AC
  Administered 2016-04-18 (×3): 1 via ORAL
  Filled 2016-04-18 (×3): qty 1

## 2016-04-18 MED ORDER — POTASSIUM & SODIUM PHOSPHATES 280-160-250 MG PO PACK
2.0000 | PACK | ORAL | Status: DC
  Administered 2016-04-19 (×2): 2 via ORAL
  Filled 2016-04-18 (×2): qty 2

## 2016-04-18 MED ORDER — POTASSIUM PHOSPHATES 15 MMOLE/5ML IV SOLN
30.0000 mmol | Freq: Once | INTRAVENOUS | Status: DC
  Filled 2016-04-18: qty 10

## 2016-04-18 MED ORDER — CALCIUM CARBONATE ANTACID 500 MG PO CHEW
400.0000 mg | CHEWABLE_TABLET | Freq: Once | ORAL | Status: AC
  Administered 2016-04-18: 400 mg via ORAL
  Filled 2016-04-18: qty 2

## 2016-04-18 MED ORDER — POTASSIUM CHLORIDE 10 MEQ/100ML IV SOLN
10.0000 meq | INTRAVENOUS | Status: AC
  Administered 2016-04-18 (×3): 10 meq via INTRAVENOUS
  Filled 2016-04-18 (×3): qty 100

## 2016-04-18 MED ORDER — POTASSIUM CHLORIDE 20 MEQ PO PACK
20.0000 meq | PACK | ORAL | Status: AC
  Administered 2016-04-18 (×2): 20 meq via ORAL
  Filled 2016-04-18 (×2): qty 1

## 2016-04-18 NOTE — Progress Notes (Addendum)
MEDICATION RELATED CONSULT NOTE - follow up  Pharmacy Consult for electrolyte monitoring/replacement Indication: hypokalemia  No Known Allergies  Patient Measurements: Height: 5\' 2"  (157.5 cm) Weight: 165 lb 4.8 oz (75 kg) IBW/kg (Calculated) : 50.1  Vital Signs: Temp: 98.6 F (37 C) (08/16 0458) Temp Source: Oral (08/16 0458) BP: 98/58 (08/16 0513) Pulse Rate: 89 (08/16 0458) Intake/Output from previous day: 08/15 0701 - 08/16 0700 In: 1735.9 [I.V.:1735.9] Out: 1700 [Urine:500; Stool:1200] Intake/Output from this shift: No intake/output data recorded.  Labs:  Recent Labs  04/16/16 0436 04/17/16 0500 04/17/16 1253 04/17/16 1819 04/18/16 0429  WBC 3.1* 2.8*  --   --   --   HGB 9.0* 8.3*  --   --   --   HCT 27.1* 24.6*  --   --   --   PLT 27* 16*  --   --   --   CREATININE 1.80*  --  1.53*  --  1.35*  MG 1.9 1.4*  --  2.1 1.9  ALBUMIN  --   --   --   --  1.2*   Lab Results  Component Value Date   K 3.2 (L) 04/18/2016   Lab Results  Component Value Date   CALCIUM 6.2 (LL) 04/18/2016   PHOS 1.3 (L) 04/18/2016    Estimated Creatinine Clearance: 37.3 mL/min (by C-G formula based on SCr of 1.35 mg/dL).  Assessment: Pharmacy consulted to monitor and replace electrolytes in this 70 year old female with hypokalemia, dehydration. 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 High ostomy output. Patient was receiving NS20K at 125 ml/hr- this was discontinued on 8/14. Patient ordered diet but poor appetite. 8/16: SCr= 1.35,  crcl =37.3  Goal of Therapy:  Electrolytes within normal limits  Plan:  8/15 AM K+= 3.2, Mag= 1.9,  Phos= 1.3               Calcium= 6.2,  Albumin= 1.2, *Albumin Corrected Calcium= 8.44*   Patient on Mag-Ox 400mg  po bid, Calcium carb 1 tab po TID.  Will order Potassium Chloride 25meq po q2h x 2 doses.  Will order Potassium Phosphate 30 mmol IV x 1 dose. Will order Calcium Carbonate 2 tabs po x 1. Will recheck K+/Phos at 2100.  Will also f/u AM  labs.   Addendum:  Patient with only 1 line and Sodium bicarb running. MD would prefer to try oral replacement for Phos. Will d/c IV KPhos and order Potassium Phosphate PO- 1 packet  q2h x 3 doses. REcheck Phos at 2100.   Chinita Greenland PharmD Clinical Pharmacist 04/18/2016

## 2016-04-18 NOTE — Progress Notes (Signed)
MEDICATION RELATED CONSULT NOTE - follow up  Pharmacy Consult for electrolyte monitoring/replacement Indication: hypokalemia  No Known Allergies  Patient Measurements: Height: 5\' 2"  (157.5 cm) Weight: 165 lb 4.8 oz (75 kg) IBW/kg (Calculated) : 50.1  Vital Signs: Temp: 99.4 F (37.4 C) (08/16 2040) Temp Source: Oral (08/16 2040) BP: 105/49 (08/16 2040) Pulse Rate: 96 (08/16 2040) Intake/Output from previous day: 08/15 0701 - 08/16 0700 In: 1735.9 [I.V.:1735.9] Out: 1700 [Urine:500; Stool:1200] Intake/Output from this shift: Total I/O In: 231 [I.V.:231] Out: 450 [Stool:450]  Labs:  Recent Labs  04/16/16 0436 04/17/16 0500 04/17/16 1253 04/17/16 1819 04/18/16 0429 04/18/16 2118  WBC 3.1* 2.8*  --   --  2.1*  --   HGB 9.0* 8.3*  --   --  7.2*  --   HCT 27.1* 24.6*  --   --  21.9*  --   PLT 27* 16*  --   --  12*  --   CREATININE 1.80*  --  1.53*  --  1.35*  --   MG 1.9 1.4*  --  2.1 1.9  --   PHOS  --   --   --   --  1.3* 1.2*  ALBUMIN  --   --   --   --  1.2*  --    Lab Results  Component Value Date   K 3.8 04/18/2016   Lab Results  Component Value Date   CALCIUM 6.2 (LL) 04/18/2016   PHOS 1.2 (L) 04/18/2016    Estimated Creatinine Clearance: 37.3 mL/min (by C-G formula based on SCr of 1.35 mg/dL).  Assessment: Pharmacy consulted to monitor and replace electrolytes in this 70 year old female with hypokalemia, dehydration. 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 High ostomy output. Patient was receiving NS20K at 125 ml/hr- this was discontinued on 8/14. Patient ordered diet but poor appetite. 8/16: SCr= 1.35,  crcl =37.3  Goal of Therapy:  Electrolytes within normal limits  Plan:  04/18/2016 at 2118 - K 3.8, phos 1.2.  Active orders for calcium carbonate 200 mg TID with meals, magnesium oxide 400 mg BID.   Ordered Phos-Nak powder, 2 packets (=16 mmol phos, 14.2 mEq K) Q4H x 3 doses. Will recheck all electrolytes approximately 4 hours after last  dose.   Tetsuo Coppola A. [place], [place].D., BCPS Clinical Pharmacist 04/18/2016

## 2016-04-18 NOTE — Plan of Care (Signed)
Problem: Bowel/Gastric: Goal: Will not experience complications related to bowel motility Outcome: Progressing Pt has not had any N/V this shift.

## 2016-04-18 NOTE — Progress Notes (Signed)
[facility] Hematology/Oncology Progress Note  Date of admission: 04/12/2016  Hospital day:  04/17/2016  Chief Complaint: [name] is a 70 y.o. female with recurrent endometrial cancer who was admitted on day 8 s/p cycle #2 carboplatin and Taxol with acute renal insufficiency.   Subjective:  Feels better.  Ostomy output better since octreotide restarted.  No bruising or bleeding.  Social History: The patient is alone today.    Allergies: No Known Allergies  Scheduled Medications: . calcium carbonate  1 tablet Oral TID WC  . feeding supplement  1 Container Oral TID BM  . loperamide  2 mg Oral Q6H  . magnesium oxide  400 mg Oral BID  . octreotide  100 mcg Subcutaneous TID  . sodium bicarbonate  1,300 mg Oral BID  . sodium chloride flush  3 mL Intravenous Q12H    Review of Systems: GENERAL: Feels better, but still weak. No fevers or sweats. PERFORMANCE STATUS (ECOG): 2 HEENT: No visual changes, runny nose, sore throat, mouth sores or tenderness. Lungs: No shortness of breath or cough. No hemoptysis. Cardiac: No chest pain, palpitations, orthopnea, or PND. GI: Liquid stool output from ostomy, decreased (1/3-1/2 bag a day).  Eating a little better.  No abdominal pain.  No nausea, vomiting, constipation, melena or hematochezia. GU: No urgency, frequency, dysuria, or hematuria. Musculoskeletal: No back pain. No joint pain. No muscle tenderness. Extremities: No pain or swelling. Skin: No rashes or skin changes. Neuro: General fatigue.  No headache, numbness or weakness, balance or coordination issues. Endocrine: No diabetes, thyroid issues, hot flashes or night sweats. Psych: No mood changes, depression or anxiety. Pain: No focal pain. Review of systems: All other systems reviewed and found to be negative.  Physical Exam: Blood pressure (!) 98/58, pulse 89, temperature 98.6 F (37 C), temperature source Oral, resp. rate 20, height 5'  2" (1.575 m), weight 165 lb 4.8 oz (75 kg), SpO2 99 %.  GENERAL: Well developed, well nourished, woman sitting up in bed watching TV on the medical unit in no acute distress.  MENTAL STATUS: Alert and oriented to person, place and time. HEAD: Short gray hair. Normocephalic, atraumatic, face symmetric, no Cushingoid features. EYES: Blue eyes. Pupils equal round and reactive to light and accomodation. No conjunctivitis or scleral icterus. ENT: Oropharynx clear without lesion. Tongue normal. Mucous membranes moist.  RESPIRATORY: Clear to auscultation without rales, wheezes or rhonchi. CARDIOVASCULAR: Regular rate and rhythm without murmur, rub or gallop. ABDOMEN: Colostomy. Soft, non-tender, with active bowel sounds, and no hepatosplenomegaly. No masses. SKIN: No petechiae.  No ecchymosis.  No rashes, ulcers or lesions. EXTREMITIES: No edema, no skin discoloration or tenderness. No palpable cords. LYMPH NODES: No palpable cervical, supraclavicular, axillary or inguinal adenopathy  NEUROLOGICAL: Unremarkable. PSYCH: Appropriate.   Results for orders placed or performed during the hospital encounter of 04/12/16 (from the past 48 hour(s))  CBC with Differential     Status: Abnormal   Collection Time: 04/17/16  5:00 AM  Result Value Ref Range   WBC 2.8 (L) 3.6 - 11.0 K/uL   RBC 2.57 (L) 3.80 - 5.20 MIL/uL   Hemoglobin 8.3 (L) 12.0 - 16.0 g/dL   HCT 24.6 (L) 35.0 - 47.0 %   MCV 95.7 80.0 - 100.0 fL   MCH 32.2 26.0 - 34.0 pg   MCHC 33.7 32.0 - 36.0 g/dL   RDW 16.4 (H) 11.5 - 14.5 %   Platelets 16 (LL) 150 - 440 K/uL    Comment: CRITICAL  VALUE NOTED.  VALUE IS CONSISTENT WITH PREVIOUSLY REPORTED AND CALLED VALUE.   Neutrophils Relative % 79.000000 %   Lymphocytes Relative 14.000000 %   Monocytes Relative 7.000000 %   Eosinophils Relative 0.000000 %   Basophils Relative 0.000000 %   Neutro Abs 2.2 1.4 - 6.5 K/uL   Lymphs Abs 0.4 (L) 1.0 - 3.6 K/uL   Monocytes Absolute 0.2  0.2 - 0.9 K/uL   Eosinophils Absolute 0.0 0 - 0.7 K/uL   Basophils Absolute 0.0 0 - 0.1 K/uL   Smear Review MORPHOLOGY UNREMARKABLE   Magnesium     Status: Abnormal   Collection Time: 04/17/16  5:00 AM  Result Value Ref Range   Magnesium 1.4 (L) 1.7 - 2.4 mg/dL  Potassium     Status: Abnormal   Collection Time: 04/17/16  5:00 AM  Result Value Ref Range   Potassium 3.0 (L) 3.5 - 5.1 mmol/L  Type and screen [facility]     Status: None   Collection Time: 04/17/16  8:02 AM  Result Value Ref Range   ABO/RH(D) O POS    Antibody Screen NEG    Sample Expiration 91/63/8466   Basic metabolic panel     Status: Abnormal   Collection Time: 04/17/16 12:53 PM  Result Value Ref Range   Sodium 137 135 - 145 mmol/L   Potassium 3.6 3.5 - 5.1 mmol/L   Chloride 114 (H) 101 - 111 mmol/L   CO2 13 (L) 22 - 32 mmol/L   Glucose, Bld 119 (H) 65 - 99 mg/dL   BUN 49 (H) 6 - 20 mg/dL   Creatinine, Ser 1.53 (H) 0.44 - 1.00 mg/dL   Calcium 6.9 (L) 8.9 - 10.3 mg/dL   GFR calc non Af Amer 34 (L) >60 mL/min   GFR calc Af Amer 39 (L) >60 mL/min    Comment: (NOTE) The eGFR has been calculated using the CKD EPI equation. This calculation has not been validated in all clinical situations. eGFR's persistently <60 mL/min signify possible Chronic Kidney Disease.    Anion gap 10 5 - 15  Potassium     Status: Abnormal   Collection Time: 04/17/16  6:19 PM  Result Value Ref Range   Potassium 3.4 (L) 3.5 - 5.1 mmol/L  Magnesium     Status: None   Collection Time: 04/17/16  6:19 PM  Result Value Ref Range   Magnesium 2.1 1.7 - 2.4 mg/dL  Magnesium     Status: None   Collection Time: 04/18/16  4:29 AM  Result Value Ref Range   Magnesium 1.9 1.7 - 2.4 mg/dL  Basic metabolic panel     Status: Abnormal   Collection Time: 04/18/16  4:29 AM  Result Value Ref Range   Sodium 137 135 - 145 mmol/L   Potassium 3.2 (L) 3.5 - 5.1 mmol/L   Chloride 110 101 - 111 mmol/L   CO2 20 (L) 22 - 32 mmol/L    Glucose, Bld 116 (H) 65 - 99 mg/dL   BUN 43 (H) 6 - 20 mg/dL   Creatinine, Ser 1.35 (H) 0.44 - 1.00 mg/dL   Calcium 6.2 (LL) 8.9 - 10.3 mg/dL    Comment: CRITICAL RESULT CALLED TO, READ BACK BY AND VERIFIED WITH MONIQUE ROBERSON @ 0534 ON 04/18/2016 BY CAF    GFR calc non Af Amer 39 (L) >60 mL/min   GFR calc Af Amer 45 (L) >60 mL/min    Comment: (NOTE) The eGFR has been calculated using the  CKD EPI equation. This calculation has not been validated in all clinical situations. eGFR's persistently <60 mL/min signify possible Chronic Kidney Disease.    Anion gap 7 5 - 15   No results found.  Assessment:  PRESLEIGH FELDSTEIN is a 70 y.o. female with with recurrent endometrial cancer admitted on day 8 s/p cycle #2 carboplatin and Taxol secondary to acute renal insufficiency secondary to dehydration from poor oral inake, nausea/vomiting, and significant ostomy output. She is improving with IVF. Creatinine has improved from 4.35 to 1.35. Potassium, magnesium, and calcium are being repleted.  Plan:  1. Oncology: Day 13 s/p cycle #2 carboplatin and Taxol. Neutropenia resolved ([place] 2200).  Platelet count continues to drift down (42,000 to 27,000 to 16,000).  Active type and screen.  No bleeding.  Discussed with patient.  Suspect etiology is due to chemotherapy.  May require a transfusion if platelet count continues to drop or if any bleeding.  Patient aware.  Given initial exposure to Lovenox on admission, HIT assay and serotonin release assay ordered (doubt).  Follow counts daily.   2. Renal: IVF with bicarb.  Slight improvement in creatinine 4.35 to 1.35.  Bicarb remains improved (13) secondary to GI losses.  Patient on sodium bicarbonate (1300 mg).  Magnesium 2.1 (normal).  Potassium (3.0) being supplemented.  Patient on oral calcium.  Appreciate nephrology consult.  3. Gastroenterology: Stool output greatly improved on octreotide SQ every 8 hours.  Will call Dr. Allen Norris in AM.  Anticipate  outpatient colonoscopy once platelet count recovers.  4.  Infectious disease:  No current issues.  No longer neutropenic.  Afebrile on no antibiotics.   Lequita Asal, MD  04/17/2016

## 2016-04-18 NOTE — Progress Notes (Signed)
[place] [facility] [place] NAME: [name]    MR#:  [id number]  DATE OF BIRTH:  [birth year]  SUBJECTIVE:  Feels a bit better than y'day Colostomy output decreasing now since she was started o SQ octreotide output 1700 cc/24 hour Wants to go home REVIEW OF SYSTEMS:   Review of Systems  Constitutional: Positive for malaise/fatigue. Negative for chills, fever and weight loss.  HENT: Negative for ear discharge, ear pain and nosebleeds.   Eyes: Negative for blurred vision, pain and discharge.  Respiratory: Negative for sputum production, shortness of breath, wheezing and stridor.   Cardiovascular: Negative for chest pain, palpitations, orthopnea and PND.  Gastrointestinal: Positive for diarrhea. Negative for abdominal pain, nausea and vomiting.  Genitourinary: Negative for frequency and urgency.  Musculoskeletal: Negative for back pain and joint pain.  Neurological: Positive for weakness. Negative for sensory change, speech change and focal weakness.  Psychiatric/Behavioral: Negative for depression and hallucinations. The patient is not nervous/anxious.   All other systems reviewed and are negative.  Tolerating Diet:yes Tolerating [id number] STR but declines  DRUG ALLERGIES:  No Known Allergies  VITALS:  Blood pressure (!) 98/58, pulse 89, temperature 98.6 F (37 C), temperature source Oral, resp. rate 20, height 5\' 2"  (1.575 m), weight 165 lb 4.8 oz (75 kg), SpO2 99 %.  PHYSICAL EXAMINATION:   Physical Exam  GENERAL:  70 y.o.-year-old patient lying in the bed with no acute distress.  EYES: Pupils equal, round, reactive to light and accommodation. No scleral icterus. Extraocular muscles intact.  HEENT: Head atraumatic, normocephalic. Oropharynx and nasopharynx clear.  NECK:  Supple, no jugular venous distention. No thyroid enlargement, no tenderness.  LUNGS: Normal breath sounds bilaterally, no wheezing, rales, rhonchi. No  use of accessory muscles of respiration.  CARDIOVASCULAR: S1, S2 normal. No murmurs, rubs, or gallops.  ABDOMEN: Soft, nontender, nondistended. Bowel sounds present. No organomegaly or mass. Colostomy+ EXTREMITIES: No cyanosis, clubbing or edema b/l.    NEUROLOGIC: Cranial nerves II through XII are intact. No focal Motor or sensory deficits b/l.   PSYCHIATRIC:  patient is alert and oriented x 3.  SKIN: No obvious rash, lesion, or ulcer.   LABORATORY PANEL:  CBC  Recent Labs Lab 04/18/16 0429  WBC 2.1*  HGB 7.2*  HCT 21.9*  PLT 12*    Chemistries   Recent Labs Lab 04/18/16 0429  NA 137  K 3.2*  CL 110  CO2 20*  GLUCOSE 116*  BUN 43*  CREATININE 1.35*  CALCIUM 6.2*  MG 1.9   Cardiac Enzymes No results for input(s): TROPONINI in the last 168 hours. RADIOLOGY:  No results found. ASSESSMENT AND PLAN:  * Acute kidney injury due to severe dehydration from diarrhea and decreased oral intake/ vomiting Replace IV fluids aggressively.  -replete electrolytes Consult with nephrology appreciated. Baseline creat 0.98 Came in with creat 4.35--3.11---2.13--1.8--1.3  * Chronic diarrhea worsened by chemotherapy Patient has received octreotide drip or shot one week back. At this time due to prolonged QT on EKG we  held off on daily shots. Consult GI apprecited -Nothing else has helped pt with slowing diarrhea other than octreotide. HEr QI interval  Is stable and so SQ octreotide resumed again while in the hospital and cont cardiac monitoring----pt's out has decreased since    * Prolonged QT is likely due to hypomagnesemia. Replaced magnesium.  -Had run of SVT -resolved  * Endometrial carcinoma Patient follows with Dr. Mike Gip as outpatient  * Neutropenia - due  to chemotherapy Neutropenic precautions.   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 Pt wants to go home with HHPT Case discussed with Care Management/Social Worker. Management plans discussed with the patient, family and they are in agreement.  CODE STATUS: full   TOTAL TIME TAKING CARE OF THIS PATIENT:30 minutes.  >50% time spent on counselling and coordination of care  POSSIBLE D/C IN 1-2 DAYS, DEPENDING ON CLINICAL CONDITION.  Note: This dictation was prepared with Dragon dictation along with smaller phrase technology. Any transcriptional errors that result from this process are unintentional.  Karion Cudd M.D on 04/18/2016 at 12:55 PM  Between 7am to 6pm - Pager - [phone]  After 6pm go to [email or web address] - password [email or web address]  [facility]  Office  [phone]  CC: Primary care physician; [name], MD

## 2016-04-18 NOTE — Progress Notes (Signed)
Subjective:  Creatinine currently down to 1.5.  Potassium is 3.2. Stool output was a bit higher yesterday. Overall in good spirits.   Objective:  Vital signs in last 24 hours:  Temp:  [98.4 F (36.9 C)-98.7 F (37.1 C)] 98.4 F (36.9 C) (08/16 1305) Pulse Rate:  [89-98] 98 (08/16 1305) Resp:  [18-20] 18 (08/16 1305) BP: (80-101)/(51-64) 101/64 (08/16 1305) SpO2:  [99 %-100 %] 99 % (08/16 1305) Weight:  [75 kg (165 lb 4.8 oz)] 75 kg (165 lb 4.8 oz) (08/16 0431)  Weight change:  Filed Weights   04/14/16 0500 04/16/16 0619 04/18/16 0431  Weight: 68.4 kg (150 lb 12.8 oz) 72 kg (158 lb 12.8 oz) 75 kg (165 lb 4.8 oz)    Intake/Output:    Intake/Output Summary (Last 24 hours) at 04/18/16 1353 Last data filed at 04/18/16 1350  Gross per 24 hour  Intake           2082.6 ml  Output             1675 ml  Net            407.6 ml     Physical Exam: General: No acute distress, laying in the bed   HEENT Moist oral mucous membranes   Neck Supple   Pulm/lungs Normal breathing effort, CTAB  CVS/Heart S1S2 no rubs  Abdomen:  Soft, nontender, nondistended, colostomy in place   Extremities: No peripheral edema   Neurologic: Alert, oriented   Skin: Scaly lesions over bilateral legs           Basic Metabolic Panel:   Recent Labs Lab 04/12/16 1419  04/14/16 0414  04/15/16 0519 04/16/16 0436 04/17/16 0500 04/17/16 1253 04/17/16 1819 04/18/16 0429  NA  --   < > 134*  --  139 139  --  137  --  137  K  --   < > 2.8*  < > 3.3* 4.1 3.0* 3.6 3.4* 3.2*  CL  --   < > 112*  --  120* 124*  --  114*  --  110  CO2  --   < > 14*  --  11* 10*  --  13*  --  20*  GLUCOSE  --   < > 84  --  88 81  --  119*  --  116*  BUN  --   < > 72*  --  64* 62*  --  49*  --  43*  CREATININE  --   < > 2.43*  --  2.13* 1.80*  --  1.53*  --  1.35*  CALCIUM  --   < > 6.3*  --  6.8* 7.2*  --  6.9*  --  6.2*  MG  --   < > 1.8  --  1.6* 1.9 1.4*  --  2.1 1.9  PHOS 5.9*  --   --   --  2.8  --   --   --   --   1.3*  < > = values in this interval not displayed.   CBC:  Recent Labs Lab 04/14/16 0414 04/15/16 0519 04/16/16 0436 04/17/16 0500 04/18/16 0429  WBC 1.0* 2.6* 3.1* 2.8* 2.1*  NEUTROABS 0.5* 1.9 2.2 2.2 1.3*  HGB 9.7* 9.0* 9.0* 8.3* 7.2*  HCT 28.9* 26.7* 27.1* 24.6* 21.9*  MCV 97.4 96.8 98.1 95.7 96.2  PLT 61* 42* 27* 16* 12*      Microbiology:  Recent Results (from the past 720 hour(s))  C difficile quick scan w PCR reflex     Status: None   Collection Time: 03/25/16  4:53 PM  Result Value Ref Range Status   C Diff antigen NEGATIVE NEGATIVE Final   C Diff toxin NEGATIVE NEGATIVE Final   C Diff interpretation No C. difficile detected.  Final  Urine culture     Status: Abnormal   Collection Time: 03/26/16  2:28 PM  Result Value Ref Range Status   Specimen Description URINE, CATHETERIZED  Final   Special Requests NONE  Final   Culture MULTIPLE SPECIES PRESENT, SUGGEST RECOLLECTION (A)  Final   Report Status 03/27/2016 FINAL  Final  Gastrointestinal Panel by PCR , Stool     Status: None   Collection Time: 03/26/16  4:47 PM  Result Value Ref Range Status   Campylobacter species NOT DETECTED NOT DETECTED Final   Plesimonas shigelloides NOT DETECTED NOT DETECTED Final   Salmonella species NOT DETECTED NOT DETECTED Final   Yersinia enterocolitica NOT DETECTED NOT DETECTED Final   Vibrio species NOT DETECTED NOT DETECTED Final   Vibrio cholerae NOT DETECTED NOT DETECTED Final   Enteroaggregative E coli (EAEC) NOT DETECTED NOT DETECTED Final   Enteropathogenic E coli (EPEC) NOT DETECTED NOT DETECTED Final   Enterotoxigenic E coli (ETEC) NOT DETECTED NOT DETECTED Final   Shiga like toxin producing E coli (STEC) NOT DETECTED NOT DETECTED Final   E. coli O157 NOT DETECTED NOT DETECTED Final   Shigella/Enteroinvasive E coli (EIEC) NOT DETECTED NOT DETECTED Final   Cryptosporidium NOT DETECTED NOT DETECTED Final   Cyclospora cayetanensis NOT DETECTED NOT DETECTED Final    Entamoeba histolytica NOT DETECTED NOT DETECTED Final   Giardia lamblia NOT DETECTED NOT DETECTED Final   Adenovirus F40/41 NOT DETECTED NOT DETECTED Final   Astrovirus NOT DETECTED NOT DETECTED Final   Norovirus GI/GII NOT DETECTED NOT DETECTED Final   Rotavirus A NOT DETECTED NOT DETECTED Final   Sapovirus (I, II, IV, and V) NOT DETECTED NOT DETECTED Final  Urine culture     Status: None   Collection Time: 03/28/16  8:52 AM  Result Value Ref Range Status   Specimen Description URINE, CATHETERIZED  Final   Special Requests NONE  Final   Culture NO GROWTH Performed at [facility]   Final   Report Status 03/29/2016 FINAL  Final    Coagulation Studies: No results for input(s): LABPROT, INR in the last 72 hours.  Urinalysis: No results for input(s): COLORURINE, LABSPEC, PHURINE, GLUCOSEU, HGBUR, BILIRUBINUR, KETONESUR, PROTEINUR, UROBILINOGEN, NITRITE, LEUKOCYTESUR in the last 72 hours.  Invalid input(s): APPERANCEUR    Imaging: No results found.   Medications:   .  sodium bicarbonate 150 mEq in sterile water 1000 mL infusion 100 mL/hr at 04/18/16 0211   . calcium carbonate  1 tablet Oral TID WC  . feeding supplement  1 Container Oral TID BM  . loperamide  2 mg Oral Q6H  . magnesium oxide  400 mg Oral BID  . octreotide  100 mcg Subcutaneous TID  . potassium & sodium phosphates  1 packet Oral Q2H  . sodium bicarbonate  1,300 mg Oral BID  . sodium chloride flush  3 mL Intravenous Q12H   acetaminophen **OR** acetaminophen, albuterol, diphenoxylate-atropine, LORazepam, ondansetron **OR** ondansetron (ZOFRAN) IV, oxyCODONE, phenol, promethazine  Assessment/ Plan:  70 y.o. female with medical problems of Endometrial cancer undergoing chemotherapy, history of diverticulitis, colostomy, who was admitted to [facility] on 04/12/2016 for evaluation of increased diarrhea x 3 days,  weakness, acute renal failure  1. Acute renal failure, nonoliguric Likely secondary to severe  dehydration from increased colostomy output Baseline serum creatinine is 0.98 from August 3 Admission creatinine 4.35 -  Renal function continues to improve.  Creatinine currently 1.35.  Continue IV fluid hydration for now.  2. Hypokalemia Likely secondary to nutritional deficiencies as well as increased urine output - potassium currently 3.2.  We will administer potassium chloride 30 mg IV in addition to the oral supplementation and she received already today.  3. Acidosis -  Serum bicarbonate up to 20.  Continue sodium bicarbonate drip.  4. Hypocalcemia Calcium currently 6.2.  Continue to monitor.  Consider repletionif this continues to drop.  Albumin currently very low at 1.2.   LOS: 6 [name] 8/16/20171:53 PM

## 2016-04-18 NOTE — Progress Notes (Signed)
Physical Therapy Treatment Patient Details Name: [name] MRN: [id number] DOB: 09-09-1945 Today's Date: 04/18/2016    History of Present Illness admitted from cancer center secondary to acute kidney injury and severe dehydration secondary to diarrhea/vomiting (related to recent chemo treatment, last received 04/05/16).    PT Comments    Pt agreeable to PT; prefers only bed exercises at this time, as she recently returned to bed. Pt complains of fatigue, general malaise and weakness. Pt participates well in supine bed exercises with assist as needed. Greater assist required on right with right hip flexors noted to be weakest in comparing to left lower extremity. Pt notes feeling a little better in general after performing exercises; pt encouraged to perform exercises several times a day for improved range and strength to improve functional mobility.   Follow Up Recommendations  SNF     Equipment Recommendations  Rolling walker with 5" wheels    Recommendations for Other Services       Precautions / Restrictions Restrictions Weight Bearing Restrictions: No    Mobility  Bed Mobility               General bed mobility comments: Not tested; pt notes she just returned to bed  Transfers                    Ambulation/Gait                 Stairs            Wheelchair Mobility    Modified Rankin (Stroke Patients Only)       Balance                                    Cognition Arousal/Alertness: Awake/alert (Fatigued) Behavior During Therapy: WFL for tasks assessed/performed Overall Cognitive Status: Within Functional Limits for tasks assessed                      Exercises General Exercises - Lower Extremity Ankle Circles/Pumps: AROM;Both;20 reps;Supine Quad Sets: Strengthening;Both;20 reps;Supine Gluteal Sets: Strengthening;Both;20 reps;Supine Short Arc Quad: AROM;20 reps;Supine;Both Heel Slides: Both;20  reps;Supine;AAROM Hip ABduction/ADduction: Both;20 reps;Supine;AAROM Straight Leg Raises: AAROM;Both;Supine;10 reps (1 set R; 2 sets L) Other Exercises Other Exercises: adductor squeeze, 20x    General Comments        Pertinent Vitals/Pain Pain Assessment: No/denies pain    Home Living                      Prior Function            PT Goals (current goals can now be found in the care plan section) Progress towards PT goals: Progressing toward goals    Frequency  Min 2X/week    PT Plan Current plan remains appropriate    Co-evaluation             End of Session   Activity Tolerance: Patient tolerated treatment well;Other (comment) (reports exercises made her feel a little better) Patient left: in bed;with call bell/phone within reach;with bed alarm set     Time: [id number] PT Time Calculation (min) (ACUTE ONLY): 23 min  Charges:                       G CodesCharlaine Dalton, PTA 04/18/2016, 3:05 PM

## 2016-04-18 NOTE — Progress Notes (Signed)
Lab notified RN of pt.'s critical lab, platelets are 12. Prime Doctor was notified, no new orders given at this time. Will continue to monitor pt.   Angus Seller

## 2016-04-18 NOTE — Progress Notes (Signed)
Lab notified RN, pt has a critical Ca 6.2. Prime Doctor notified, stated he will place orders. Will monitor pt closely.   [name]

## 2016-04-18 NOTE — Progress Notes (Signed)
Per Pharmacist, okay to Y-site runs of K+ with Sodium Bicarb.

## 2016-04-18 NOTE — Consult Note (Signed)
   [facility] CM Inpatient Consult   04/18/2016  [name] June 09, 1946 [id number]  Patient screened for potential [place] Management services. Patient is eligible for [place]. Electronic medical record reveals there are no identifiable [facility] care management needs at this time. [facility] Care Management services not appropriate at this time. If patient's post hospital needs change please place a [facility] Care Management consult. For questions please contact:   Silverio Hagan RN, [place] [facility] Liaison  [phone]) Business Mobile [phone]) Toll free office

## 2016-04-19 DIAGNOSIS — T451X5S Adverse effect of antineoplastic and immunosuppressive drugs, sequela: Secondary | ICD-10-CM

## 2016-04-19 LAB — BASIC METABOLIC PANEL
Anion gap: 4 — ABNORMAL LOW (ref 5–15)
BUN: 31 mg/dL — AB (ref 6–20)
CALCIUM: 6.3 mg/dL — AB (ref 8.9–10.3)
CHLORIDE: 108 mmol/L (ref 101–111)
CO2: 27 mmol/L (ref 22–32)
CREATININE: 1.22 mg/dL — AB (ref 0.44–1.00)
GFR calc non Af Amer: 44 mL/min — ABNORMAL LOW (ref >60)
GFR, EST AFRICAN AMERICAN: 51 mL/min — AB (ref >60)
Glucose, Bld: 143 mg/dL — ABNORMAL HIGH (ref 65–99)
Potassium: 3.5 mmol/L (ref 3.5–5.1)
SODIUM: 139 mmol/L (ref 135–145)

## 2016-04-19 LAB — CBC WITH DIFFERENTIAL/PLATELET
Band Neutrophils: 5 %
Basophils Absolute: 0 10*3/uL (ref 0–0.1)
Basophils Relative: 0 %
Blasts: 0 %
Eosinophils Absolute: 0 10*3/uL (ref 0–0.7)
Eosinophils Relative: 0 %
HCT: 21.8 % — ABNORMAL LOW (ref 35.0–47.0)
Hemoglobin: 7.3 g/dL — ABNORMAL LOW (ref 12.0–16.0)
Lymphocytes Relative: 34 %
Lymphs Abs: 0.8 10*3/uL — ABNORMAL LOW (ref 1.0–3.6)
MCH: 31.7 pg (ref 26.0–34.0)
MCHC: 33.3 g/dL (ref 32.0–36.0)
MCV: 95 fL (ref 80.0–100.0)
Metamyelocytes Relative: 1 %
Monocytes Absolute: 0.1 10*3/uL — ABNORMAL LOW (ref 0.2–0.9)
Monocytes Relative: 5 %
Myelocytes: 0 %
Neutro Abs: 1.4 10*3/uL (ref 1.4–6.5)
Neutrophils Relative %: 55 %
Other: 0 %
Platelets: 12 10*3/uL — CL (ref 150–440)
Promyelocytes Absolute: 0 %
RBC: 2.3 MIL/uL — ABNORMAL LOW (ref 3.80–5.20)
RDW: 16 % — ABNORMAL HIGH (ref 11.5–14.5)
WBC: 2.3 10*3/uL — ABNORMAL LOW (ref 3.6–11.0)
nRBC: 0 /100 WBC

## 2016-04-19 LAB — HEPARIN INDUCED PLATELET AB (HIT ANTIBODY): Heparin Induced Plt Ab: 0.173 OD (ref 0.000–0.400)

## 2016-04-19 LAB — PHOSPHORUS: PHOSPHORUS: 1.4 mg/dL — AB (ref 2.5–4.6)

## 2016-04-19 LAB — MAGNESIUM: MAGNESIUM: 1.4 mg/dL — AB (ref 1.7–2.4)

## 2016-04-19 MED ORDER — POTASSIUM PHOSPHATES 15 MMOLE/5ML IV SOLN
30.0000 mmol | Freq: Once | INTRAVENOUS | Status: AC
  Administered 2016-04-19: 30 mmol via INTRAVENOUS
  Filled 2016-04-19: qty 10

## 2016-04-19 MED ORDER — CALCIUM CARBONATE ANTACID 500 MG PO CHEW
400.0000 mg | CHEWABLE_TABLET | ORAL | Status: AC
  Administered 2016-04-19: 400 mg via ORAL
  Filled 2016-04-19: qty 2

## 2016-04-19 MED ORDER — MAGNESIUM SULFATE 2 GM/50ML IV SOLN
2.0000 g | Freq: Once | INTRAVENOUS | Status: AC
  Administered 2016-04-19: 2 g via INTRAVENOUS
  Filled 2016-04-19: qty 50

## 2016-04-19 MED ORDER — CALCIUM CARBONATE ANTACID 500 MG PO CHEW
400.0000 mg | CHEWABLE_TABLET | Freq: Three times a day (TID) | ORAL | Status: DC
  Administered 2016-04-19 – 2016-04-20 (×3): 400 mg via ORAL
  Filled 2016-04-19 (×3): qty 2

## 2016-04-19 NOTE — Progress Notes (Signed)
MEDICATION RELATED CONSULT NOTE - follow up  Pharmacy Consult for electrolyte monitoring/replacement Indication: hypokalemia  No Known Allergies  Patient Measurements: Height: 5\' 2"  (157.5 cm) Weight: 169 lb 1.6 oz (76.7 kg) IBW/kg (Calculated) : 50.1  Vital Signs: Temp: 98.3 F (36.8 C) (08/17 1240) Temp Source: Oral (08/17 1240) BP: 94/62 (08/17 1240) Pulse Rate: 97 (08/17 1240) Intake/Output from previous day: 08/16 0701 - 08/17 0700 In: 2991 [P.O.:940; I.V.:2003; IV Piggyback:48] Out: [id number] [Urine:1250; Stool:1800] Intake/Output from this shift: Total I/O In: 797 [P.O.:240; I.V.:557] Out: 875 [Urine:350; Stool:525]  Labs:  Recent Labs  04/17/16 0500 04/17/16 1253 04/17/16 1819 04/18/16 0429 04/18/16 2118 04/19/16 0430 04/19/16 1311  WBC 2.8*  --   --  2.1*  --  2.3*  --   HGB 8.3*  --   --  7.2*  --  7.3*  --   HCT 24.6*  --   --  21.9*  --  21.8*  --   PLT 16*  --   --  12*  --  12*  --   CREATININE  --  1.53*  --  1.35*  --   --  1.22*  MG 1.4*  --  2.1 1.9  --   --  1.4*  PHOS  --   --   --  1.3* 1.2*  --  1.4*  ALBUMIN  --   --   --  1.2*  --   --   --    Lab Results  Component Value Date   K 3.5 04/19/2016   Lab Results  Component Value Date   CALCIUM 6.3 (LL) 04/19/2016   PHOS 1.4 (L) 04/19/2016    Estimated Creatinine Clearance: 41.7 mL/min (by C-G formula based on SCr of 1.22 mg/dL).  Assessment: Pharmacy consulted to monitor and replace electrolytes in this 70 year old female with hypokalemia, dehydration. 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 High ostomy output. Patient ordered diet but poor appetite.   Goal of Therapy:  Electrolytes within normal limits  Plan:   K+= 3.5, Mag= 1.4,  Phos= 1.4          Calcium= 6.3,  Albumin= 1.2, **Albumin Corrected Calcium= 8.54** (WNL 8.9-10.3)   Patient on Mag-Ox 400mg  po bid, Calcium carb 1 tab po TID.   Now able to stop Sodium Bicarb drip (per MD), so will replace Phos and Mag IV instead of  po. (Patient has 1 line). Will increase Calcium carb PO to 2 tabs TID and give extra dose of 2 tabs PO x1. Will order Magnesium sulfate 2 gram IV x 1. Will order Potassium Phosphate 30 mmol IV x 1 dose.   Will f/u AM labs.    Chinita Greenland PharmD Clinical Pharmacist 04/19/2016

## 2016-04-19 NOTE — Progress Notes (Signed)
Physical Therapy Treatment Patient Details Name: [name] MRN: [id number] DOB: Feb 02, 1946 Today's Date: 04/19/2016    History of Present Illness admitted from cancer center secondary to acute kidney injury and severe dehydration secondary to diarrhea/vomiting (related to recent chemo treatment, last received 04/05/16).    PT Comments    Pt agrees to session.  Participated in supine exercises with self initiated rest breaks due to fatigue.  She was able to increase her ambulation distances today 20', 16' 10' x 2 with walker and min guard.  She fatigues quickly and needs to sit due to leg weakness.     Follow Up Recommendations  SNF     Equipment Recommendations  Rolling walker with 5" wheels    Recommendations for Other Services       Precautions / Restrictions Precautions Precautions: Fall Precaution Comments: L LQ colostomy, R chest port Restrictions Weight Bearing Restrictions: No    Mobility  Bed Mobility Overal bed mobility: Needs Assistance Bed Mobility: Supine to Sit     Supine to sit: Mod assist        Transfers Overall transfer level: Needs assistance Equipment used: Rolling walker (2 wheeled) Transfers: Sit to/from Stand Sit to Stand: Min assist            Ambulation/Gait Ambulation/Gait assistance: Min guard Ambulation Distance (Feet): 20 Feet (16, 10 x 2 with seated rest breaks) Assistive device: Rolling walker (2 wheeled) Gait Pattern/deviations: Step-through pattern   Gait velocity interpretation: Below normal speed for age/gender General Gait Details: broad BOS, staggering steps, slightly impulsive   Stairs            Wheelchair Mobility    Modified Rankin (Stroke Patients Only)       Balance Overall balance assessment: Needs assistance Sitting-balance support: No upper extremity supported Sitting balance-Leahy Scale: Good     Standing balance support: Bilateral upper extremity supported Standing balance-Leahy  Scale: Fair                      Cognition Arousal/Alertness: Awake/alert Behavior During Therapy: WFL for tasks assessed/performed Overall Cognitive Status: Within Functional Limits for tasks assessed                      Exercises General Exercises - Lower Extremity Ankle Circles/Pumps: AROM;Both;20 reps;Supine Quad Sets: Strengthening;Both;20 reps;Supine Gluteal Sets: Strengthening;Both;20 reps;Supine Heel Slides: Both;AROM;Supine;10 reps Hip ABduction/ADduction: AROM;Both;10 reps;Supine Straight Leg Raises: AAROM;Both;Supine;10 reps    General Comments        Pertinent Vitals/Pain Pain Assessment: No/denies pain    Home Living                      Prior Function            PT Goals (current goals can now be found in the care plan section) Progress towards PT goals: Progressing toward goals    Frequency  Min 2X/week    PT Plan Current plan remains appropriate    Co-evaluation             End of Session Equipment Utilized During Treatment: Gait belt Activity Tolerance: Patient tolerated treatment well Patient left: in chair;with family/visitor present     Time: 1050-1117 PT Time Calculation (min) (ACUTE ONLY): 27 min  Charges:  $Gait Training: 8-22 mins $Therapeutic Exercise: 8-22 mins                    G Codes:  Chesley Noon, PTA 04/19/16, 1:16 PM

## 2016-04-19 NOTE — Progress Notes (Signed)
[place] [facility] [place] NAME: [name]    MR#:  [id number]  DATE OF BIRTH:  Apr 28, 1946  SUBJECTIVE:  Pt getting a bit frustrated and Wants to go home REVIEW OF SYSTEMS:   Review of Systems  Constitutional: Positive for malaise/fatigue. Negative for chills, fever and weight loss.  HENT: Negative for ear discharge, ear pain and nosebleeds.   Eyes: Negative for blurred vision, pain and discharge.  Respiratory: Negative for sputum production, shortness of breath, wheezing and stridor.   Cardiovascular: Negative for chest pain, palpitations, orthopnea and PND.  Gastrointestinal: Positive for diarrhea. Negative for abdominal pain, nausea and vomiting.  Genitourinary: Negative for frequency and urgency.  Musculoskeletal: Negative for back pain and joint pain.  Neurological: Positive for weakness. Negative for sensory change, speech change and focal weakness.  Psychiatric/Behavioral: Negative for depression and hallucinations. The patient is not nervous/anxious.   All other systems reviewed and are negative.  Tolerating Diet:yes Tolerating [id number] STR but declines  DRUG ALLERGIES:  No Known Allergies  VITALS:  Blood pressure 94/62, pulse 97, temperature 98.3 F (36.8 C), temperature source Oral, resp. rate 19, height 5\' 2"  (1.575 m), weight 169 lb (76.7 kg), SpO2 100 %.  PHYSICAL EXAMINATION:   Physical Exam  GENERAL:  70 y.o.-year-old patient lying in the bed with no acute distress.  EYES: Pupils equal, round, reactive to light and accommodation. No scleral icterus. Extraocular muscles intact.  HEENT: Head atraumatic, normocephalic. Oropharynx and nasopharynx clear.  NECK:  Supple, no jugular venous distention. No thyroid enlargement, no tenderness.  LUNGS: Normal breath sounds bilaterally, no wheezing, rales, rhonchi. No use of accessory muscles of respiration.  CARDIOVASCULAR: S1, S2 normal. No murmurs, rubs, or  gallops.  ABDOMEN: Soft, nontender, nondistended. Bowel sounds present. No organomegaly or mass. Colostomy+ EXTREMITIES: No cyanosis, clubbing or edema b/l.    NEUROLOGIC: Cranial nerves II through XII are intact. No focal Motor or sensory deficits b/l.   PSYCHIATRIC:  patient is alert and oriented x 3.  SKIN: No obvious rash, lesion, or ulcer.   LABORATORY PANEL:  CBC  Recent Labs Lab 04/19/16 0430  WBC 2.3*  HGB 7.3*  HCT 21.8*  PLT 12*    Chemistries   Recent Labs Lab 04/19/16 1311  NA 139  K 3.5  CL 108  CO2 27  GLUCOSE 143*  BUN 31*  CREATININE 1.22*  CALCIUM 6.3*  MG 1.4*   Cardiac Enzymes No results for input(s): TROPONINI in the last 168 hours. RADIOLOGY:  No results found. ASSESSMENT AND PLAN:  * Acute kidney injury due to severe dehydration from diarrhea and decreased oral intake/ vomiting Replace IV fluids aggressively.  -replete electrolytes Consult with nephrology appreciated. Baseline creat 0.98 Came in with creat 4.35--3.11---2.13--1.8--1.3--1.1  * Chronic diarrhea worsened by chemotherapy Patient has received octreotide drip or shot one week back. At this time due to prolonged QT on EKG we  held off on daily shots. Consult GI apprecited -Nothing else has helped pt with slowing diarrhea other than octreotide. HEr QI interval  Is stable and so SQ octreotide resumed again while in the hospital and cont cardiac monitoring----pt's out has decreased since    * Prolonged QT is likely due to hypomagnesemia. Replaced magnesium.  -Had run of SVT -resolved  * Endometrial carcinoma Patient follows with Dr. Mike Gip as outpatient  * Neutropenia - due to chemotherapy Neutropenic precautions.   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 Pt wants to  go home with HHPT Case discussed with Care Management/Social Worker. Management plans discussed with the patient, family and they are in agreement.  CODE STATUS: full   TOTAL TIME TAKING CARE OF THIS PATIENT:25 minutes.  >50% time spent on counselling and coordination of care  POSSIBLE D/C IN 1-2 DAYS, DEPENDING ON CLINICAL CONDITION.  Note: This dictation was prepared with Dragon dictation along with smaller phrase technology. Any transcriptional errors that result from this process are unintentional.  Hurman Ketelsen M.D on 04/19/2016 at 5:16 PM  Between 7am to 6pm - Pager - [phone]  After 6pm go to [email or web address] - password [email or web address] [facility]  [facility]  Office  [phone]  CC: Primary care physician; [name], MD

## 2016-04-19 NOTE — Progress Notes (Signed)
Subjective:  Patient seen at bedside. Creatinine yesterday was 1.35. No new creatinine today. Had increased stool output yesterday approximately 1.8 L.  Objective:  Vital signs in last 24 hours:  Temp:  [98.4 F (36.9 C)-99.4 F (37.4 C)] 99.1 F (37.3 C) (08/17 0523) Pulse Rate:  [94-101] 94 (08/17 0523) Resp:  [18-20] 20 (08/17 0523) BP: (92-105)/(49-64) 92/53 (08/17 0523) SpO2:  [98 %-99 %] 99 % (08/17 0523) Weight:  [76.7 kg (169 lb 1.6 oz)] 76.7 kg (169 lb 1.6 oz) (08/17 0429)  Weight change: 1.724 kg (3 lb 12.8 oz) Filed Weights   04/16/16 0619 04/18/16 0431 04/19/16 0429  Weight: 72 kg (158 lb 12.8 oz) 75 kg (165 lb 4.8 oz) 76.7 kg (169 lb 1.6 oz)    Intake/Output:    Intake/Output Summary (Last 24 hours) at 04/19/16 1212 Last data filed at 04/19/16 1110  Gross per 24 hour  Intake             3199 ml  Output             3675 ml  Net             -476 ml     Physical Exam: General: No acute distress, laying in the bed   HEENT Moist oral mucous membranes   Neck Supple   Pulm/lungs Normal breathing effort, CTAB  CVS/Heart S1S2 no rubs  Abdomen:  Soft, nontender, nondistended, colostomy in place   Extremities: No peripheral edema   Neurologic: Alert, oriented   Skin: Scaly lesions over bilateral legs           Basic Metabolic Panel:   Recent Labs Lab 04/12/16 1419  04/14/16 0414  04/15/16 0519 04/16/16 0436 04/17/16 0500 04/17/16 1253 04/17/16 1819 04/18/16 0429 04/18/16 2118  NA  --   < > 134*  --  139 139  --  137  --  137  --   K  --   < > 2.8*  < > 3.3* 4.1 3.0* 3.6 3.4* 3.2* 3.8  CL  --   < > 112*  --  120* 124*  --  114*  --  110  --   CO2  --   < > 14*  --  11* 10*  --  13*  --  20*  --   GLUCOSE  --   < > 84  --  88 81  --  119*  --  116*  --   BUN  --   < > 72*  --  64* 62*  --  49*  --  43*  --   CREATININE  --   < > 2.43*  --  2.13* 1.80*  --  1.53*  --  1.35*  --   CALCIUM  --   < > 6.3*  --  6.8* 7.2*  --  6.9*  --  6.2*  --    MG  --   < > 1.8  --  1.6* 1.9 1.4*  --  2.1 1.9  --   PHOS 5.9*  --   --   --  2.8  --   --   --   --  1.3* 1.2*  < > = values in this interval not displayed.   CBC:  Recent Labs Lab 04/15/16 0519 04/16/16 0436 04/17/16 0500 04/18/16 0429 04/19/16 0430  WBC 2.6* 3.1* 2.8* 2.1* 2.3*  NEUTROABS 1.9 2.2 2.2 1.3* 1.4  HGB 9.0* 9.0* 8.3* 7.2* 7.3*  HCT 26.7* 27.1* 24.6* 21.9* 21.8*  MCV 96.8 98.1 95.7 96.2 95.0  PLT 42* 27* 16* 12* 12*      Microbiology:  Recent Results (from the past 720 hour(s))  C difficile quick scan w PCR reflex     Status: None   Collection Time: 03/25/16  4:53 PM  Result Value Ref Range Status   C Diff antigen NEGATIVE NEGATIVE Final   C Diff toxin NEGATIVE NEGATIVE Final   C Diff interpretation No C. difficile detected.  Final  Urine culture     Status: Abnormal   Collection Time: 03/26/16  2:28 PM  Result Value Ref Range Status   Specimen Description URINE, CATHETERIZED  Final   Special Requests NONE  Final   Culture MULTIPLE SPECIES PRESENT, SUGGEST RECOLLECTION (A)  Final   Report Status 03/27/2016 FINAL  Final  Gastrointestinal Panel by PCR , Stool     Status: None   Collection Time: 03/26/16  4:47 PM  Result Value Ref Range Status   Campylobacter species NOT DETECTED NOT DETECTED Final   Plesimonas shigelloides NOT DETECTED NOT DETECTED Final   Salmonella species NOT DETECTED NOT DETECTED Final   Yersinia enterocolitica NOT DETECTED NOT DETECTED Final   Vibrio species NOT DETECTED NOT DETECTED Final   Vibrio cholerae NOT DETECTED NOT DETECTED Final   Enteroaggregative E coli (EAEC) NOT DETECTED NOT DETECTED Final   Enteropathogenic E coli (EPEC) NOT DETECTED NOT DETECTED Final   Enterotoxigenic E coli (ETEC) NOT DETECTED NOT DETECTED Final   Shiga like toxin producing E coli (STEC) NOT DETECTED NOT DETECTED Final   E. coli O157 NOT DETECTED NOT DETECTED Final   Shigella/Enteroinvasive E coli (EIEC) NOT DETECTED NOT DETECTED Final    Cryptosporidium NOT DETECTED NOT DETECTED Final   Cyclospora cayetanensis NOT DETECTED NOT DETECTED Final   Entamoeba histolytica NOT DETECTED NOT DETECTED Final   Giardia lamblia NOT DETECTED NOT DETECTED Final   Adenovirus F40/41 NOT DETECTED NOT DETECTED Final   Astrovirus NOT DETECTED NOT DETECTED Final   Norovirus GI/GII NOT DETECTED NOT DETECTED Final   Rotavirus A NOT DETECTED NOT DETECTED Final   Sapovirus (I, II, IV, and V) NOT DETECTED NOT DETECTED Final  Urine culture     Status: None   Collection Time: 03/28/16  8:52 AM  Result Value Ref Range Status   Specimen Description URINE, CATHETERIZED  Final   Special Requests NONE  Final   Culture NO GROWTH Performed at [facility]   Final   Report Status 03/29/2016 FINAL  Final    Coagulation Studies: No results for input(s): LABPROT, INR in the last 72 hours.  Urinalysis: No results for input(s): COLORURINE, LABSPEC, PHURINE, GLUCOSEU, HGBUR, BILIRUBINUR, KETONESUR, PROTEINUR, UROBILINOGEN, NITRITE, LEUKOCYTESUR in the last 72 hours.  Invalid input(s): APPERANCEUR    Imaging: No results found.   Medications:   .  sodium bicarbonate 150 mEq in sterile water 1000 mL infusion 50 mL/hr at 04/19/16 1112   . calcium carbonate  1 tablet Oral TID WC  . feeding supplement  1 Container Oral TID BM  . loperamide  2 mg Oral Q6H  . magnesium oxide  400 mg Oral BID  . octreotide  100 mcg Subcutaneous TID  . sodium bicarbonate  1,300 mg Oral BID  . sodium chloride flush  3 mL Intravenous Q12H   acetaminophen **OR** acetaminophen, albuterol, diphenoxylate-atropine, LORazepam, ondansetron **OR** ondansetron (ZOFRAN) IV, oxyCODONE, phenol, promethazine  Assessment/ Plan:  70 y.o. female with medical problems  of Endometrial cancer undergoing chemotherapy, history of diverticulitis, colostomy, who was admitted to [facility] on 04/12/2016 for evaluation of increased diarrhea x 3 days, weakness, acute renal failure  1. Acute renal  failure, nonoliguric Likely secondary to severe dehydration from increased colostomy output Baseline serum creatinine is 0.98 from August 3 Admission creatinine 4.35 -  Creatinine was down to 1.35 yesterday. No new creatinine today. Recommend continued periodic monitoring of renal function.  2. Hypokalemia Likely secondary to nutritional deficiencies as well as increased urine output - Repeat potassium is now up to 3.8. She is likely to continue to lose potassium through her stool. Continue periodic monitoring of potassium.  3. Acidosis -  Decrease sodium bicarbonate drip to 50 cc per hour. Continue to monitor serum bicarbonate.  4. Hypocalcemia Calcium 6.2 with an albumin of 1.2. Continue to monitor serum calcium levels.   LOS: 7 [name] 8/17/201712:12 PM

## 2016-04-20 ENCOUNTER — Other Ambulatory Visit: Payer: Self-pay | Admitting: *Deleted

## 2016-04-20 DIAGNOSIS — C541 Malignant neoplasm of endometrium: Secondary | ICD-10-CM

## 2016-04-20 LAB — BASIC METABOLIC PANEL
Anion gap: 5 (ref 5–15)
BUN: 26 mg/dL — AB (ref 6–20)
CO2: 30 mmol/L (ref 22–32)
CREATININE: 1.11 mg/dL — AB (ref 0.44–1.00)
Calcium: 6.3 mg/dL — CL (ref 8.9–10.3)
Chloride: 105 mmol/L (ref 101–111)
GFR calc Af Amer: 57 mL/min — ABNORMAL LOW (ref >60)
GFR, EST NON AFRICAN AMERICAN: 49 mL/min — AB (ref >60)
Glucose, Bld: 117 mg/dL — ABNORMAL HIGH (ref 65–99)
POTASSIUM: 3.8 mmol/L (ref 3.5–5.1)
SODIUM: 140 mmol/L (ref 135–145)

## 2016-04-20 LAB — CBC WITH DIFFERENTIAL/PLATELET
Band Neutrophils: 0 %
Basophils Absolute: 0 10*3/uL (ref 0–0.1)
Basophils Relative: 0 %
Blasts: 0 %
Eosinophils Absolute: 0 10*3/uL (ref 0–0.7)
Eosinophils Relative: 0 %
HCT: 21 % — ABNORMAL LOW (ref 35.0–47.0)
Hemoglobin: 7 g/dL — ABNORMAL LOW (ref 12.0–16.0)
Lymphocytes Relative: 28 %
Lymphs Abs: 0.6 10*3/uL — ABNORMAL LOW (ref 1.0–3.6)
MCH: 31.6 pg (ref 26.0–34.0)
MCHC: 33.4 g/dL (ref 32.0–36.0)
MCV: 94.7 fL (ref 80.0–100.0)
Metamyelocytes Relative: 4 %
Monocytes Absolute: 0.3 10*3/uL (ref 0.2–0.9)
Monocytes Relative: 12 %
Myelocytes: 0 %
Neutro Abs: 1.4 10*3/uL (ref 1.4–6.5)
Neutrophils Relative %: 56 %
Other: 0 %
Platelets: 12 10*3/uL — CL (ref 150–440)
Promyelocytes Absolute: 0 %
RBC: 2.22 MIL/uL — ABNORMAL LOW (ref 3.80–5.20)
RDW: 15.6 % — ABNORMAL HIGH (ref 11.5–14.5)
WBC: 2.3 10*3/uL — ABNORMAL LOW (ref 3.6–11.0)
nRBC: 0 /100 WBC

## 2016-04-20 LAB — MAGNESIUM: MAGNESIUM: 1.7 mg/dL (ref 1.7–2.4)

## 2016-04-20 LAB — RETICULOCYTES
RBC.: 2.22 MIL/uL — ABNORMAL LOW (ref 3.80–5.20)
Retic Count, Absolute: 6.7 10*3/uL — ABNORMAL LOW (ref 19.0–183.0)
Retic Ct Pct: 0.3 % — ABNORMAL LOW (ref 0.4–3.1)

## 2016-04-20 LAB — HEMOGLOBIN AND HEMATOCRIT, BLOOD
HEMATOCRIT: 25.7 % — AB (ref 35.0–47.0)
HEMOGLOBIN: 8.6 g/dL — AB (ref 12.0–16.0)

## 2016-04-20 LAB — PHOSPHORUS: Phosphorus: 3 mg/dL (ref 2.5–4.6)

## 2016-04-20 LAB — PREPARE RBC (CROSSMATCH)

## 2016-04-20 LAB — PATHOLOGIST SMEAR REVIEW

## 2016-04-20 MED ORDER — HEPARIN SOD (PORK) LOCK FLUSH 100 UNIT/ML IV SOLN
500.0000 [IU] | Freq: Every day | INTRAVENOUS | Status: DC | PRN
  Filled 2016-04-20: qty 5

## 2016-04-20 MED ORDER — POTASSIUM CHLORIDE 20 MEQ PO PACK
40.0000 meq | PACK | Freq: Two times a day (BID) | ORAL | Status: DC
  Administered 2016-04-20: 40 meq via ORAL
  Filled 2016-04-20: qty 2

## 2016-04-20 MED ORDER — SODIUM CHLORIDE 0.9 % IV SOLN
250.0000 mL | Freq: Once | INTRAVENOUS | Status: AC
  Administered 2016-04-20: 250 mL via INTRAVENOUS

## 2016-04-20 MED ORDER — SODIUM BICARBONATE 650 MG PO TABS: 1300.0000 mg | ORAL_TABLET | Freq: Two times a day (BID) | ORAL | 6 refills | Status: DC

## 2016-04-20 MED ORDER — HEPARIN SOD (PORK) LOCK FLUSH 100 UNIT/ML IV SOLN
250.0000 [IU] | INTRAVENOUS | Status: DC | PRN
  Filled 2016-04-20: qty 3

## 2016-04-20 MED ORDER — ACETAMINOPHEN 325 MG PO TABS
650.0000 mg | ORAL_TABLET | Freq: Once | ORAL | Status: AC
  Administered 2016-04-20: 650 mg via ORAL
  Filled 2016-04-20: qty 2

## 2016-04-20 MED ORDER — DIPHENHYDRAMINE HCL 25 MG PO CAPS
25.0000 mg | ORAL_CAPSULE | Freq: Once | ORAL | Status: AC
  Administered 2016-04-20: 25 mg via ORAL
  Filled 2016-04-20: qty 1

## 2016-04-20 MED ORDER — SODIUM CHLORIDE 0.9% FLUSH: 10.0000 mL | INTRAVENOUS | Status: DC | PRN

## 2016-04-20 MED ORDER — LORAZEPAM 0.5 MG PO TABS: 0.5000 mg | ORAL_TABLET | Freq: Three times a day (TID) | ORAL | 0 refills | Status: DC | PRN

## 2016-04-20 MED ORDER — SODIUM CHLORIDE 0.9% FLUSH: 3.0000 mL | INTRAVENOUS | Status: DC | PRN

## 2016-04-20 MED ORDER — BOOST / RESOURCE BREEZE PO LIQD: 1.0000 | Freq: Three times a day (TID) | ORAL | 0 refills | Status: DC

## 2016-04-20 MED ORDER — POTASSIUM CHLORIDE 20 MEQ PO PACK: 40.0000 meq | PACK | Freq: Two times a day (BID) | ORAL | 1 refills | Status: DC

## 2016-04-20 MED ORDER — CALCIUM CARBONATE ANTACID 500 MG PO CHEW: 400.0000 mg | CHEWABLE_TABLET | Freq: Three times a day (TID) | ORAL | 1 refills | Status: AC

## 2016-04-20 MED ORDER — CALCIUM CARBONATE ANTACID 500 MG PO CHEW
400.0000 mg | CHEWABLE_TABLET | ORAL | Status: AC
  Administered 2016-04-20: 400 mg via ORAL
  Filled 2016-04-20: qty 2

## 2016-04-20 MED ORDER — HEPARIN SOD (PORK) LOCK FLUSH 100 UNIT/ML IV SOLN
500.0000 [IU] | Freq: Once | INTRAVENOUS | Status: AC
  Administered 2016-04-20: 500 [IU] via INTRAVENOUS
  Filled 2016-04-20: qty 5

## 2016-04-20 MED ORDER — MAGNESIUM OXIDE 400 (241.3 MG) MG PO TABS: 400.0000 mg | ORAL_TABLET | Freq: Two times a day (BID) | ORAL | 1 refills | Status: DC

## 2016-04-20 NOTE — Progress Notes (Signed)
MD notified of Calcium 6.3 and Hgb 7.0. No new orders given at this time.

## 2016-04-20 NOTE — Progress Notes (Signed)
PT Cancellation Note  Patient Details Name: [name] MRN: [id number] DOB: 04-Apr-1946   Cancelled Treatment:    Reason Eval/Treat Not Completed: Medical issues which prohibited therapy.  Low BP, low hgb, low Ca+ and will hold PT for now.   Ramond Dial 04/20/2016, 9:00 AM    Mee Hives, PT MS Acute Rehab Dept. Number: [place] and [place]

## 2016-04-20 NOTE — Care Management (Signed)
Patient to discharge home today.  Orders for home health PT, RN, and aide placed.  I have notified Brittney with [facility] home health of discharge.  Patient already has RW in the home.  RNSM signing off

## 2016-04-20 NOTE — Progress Notes (Signed)
[facility] Hematology/Oncology Progress Note  Date of admission: 04/12/2016  Hospital day:  04/19/2016  Chief Complaint: [name] is a 70 y.o. female with recurrent endometrial cancer who was admitted on day 8 s/p cycle #2 carboplatin and Taxol with acute renal insufficiency.   Subjective:  Feels good, but still weak.  Up out of bed.  Ostomy output controlled on short acting octreotide.  No bruising or bleeding.  Social History: The patient is alone today.    Allergies: No Known Allergies  Scheduled Medications: . calcium carbonate  400 mg of elemental calcium Oral TID WC  . feeding supplement  1 Container Oral TID BM  . loperamide  2 mg Oral Q6H  . magnesium oxide  400 mg Oral BID  . octreotide  100 mcg Subcutaneous TID  . sodium bicarbonate  1,300 mg Oral BID  . sodium chloride flush  3 mL Intravenous Q12H    Review of Systems: GENERAL: Feels better, but still weak. No fevers or sweats. PERFORMANCE STATUS (ECOG): 2 HEENT: No visual changes, runny nose, sore throat, mouth sores or tenderness. Lungs: No shortness of breath or cough. No hemoptysis. Cardiac: No chest pain, palpitations, orthopnea, or PND. GI: Liquid stool output from ostomy, manageable.  Eating a little better.  No abdominal pain.  No nausea, vomiting, constipation, melena or hematochezia. GU: No urgency, frequency, dysuria, or hematuria. Musculoskeletal: No back pain. No joint pain. No muscle tenderness. Extremities: No pain or swelling. Skin: No rashes or skin changes. Neuro: General weakness.  No headache, numbness or focal weakness, balance or coordination issues. Endocrine: No diabetes, thyroid issues, hot flashes or night sweats. Psych: No mood changes, depression or anxiety. Pain: No focal pain. Review of systems: All other systems reviewed and found to be negative.  Physical Exam: Blood pressure 92/63, pulse 99, temperature 99.5 F (37.5 C), temperature  source Oral, resp. rate 18, height 5' 2"  (1.575 m), weight 169 lb (76.7 kg), SpO2 95 %.  GENERAL: Well developed, well nourished, woman sitting up in bed watching TV on the medical unit in no acute distress.  MENTAL STATUS: Alert and oriented to person, place and time. HEAD: Short gray hair. Normocephalic, atraumatic, face symmetric, no Cushingoid features. EYES: Blue eyes. Pupils equal round and reactive to light and accomodation. No conjunctivitis or scleral icterus. ENT: Oropharynx clear without lesion. Tongue normal. Mucous membranes moist.  RESPIRATORY: Clear to auscultation without rales, wheezes or rhonchi. CARDIOVASCULAR: Regular rate and rhythm without murmur, rub or gallop. ABDOMEN: Colostomy. Soft, non-tender, with active bowel sounds, and no hepatosplenomegaly. No masses. SKIN: No petechiae.  No ecchymosis.  No rashes, ulcers or lesions. EXTREMITIES: No edema, no skin discoloration or tenderness. No palpable cords. LYMPH NODES: No palpable cervical, supraclavicular, axillary or inguinal adenopathy  NEUROLOGICAL: Unremarkable. PSYCH: Appropriate.   Results for orders placed or performed during the hospital encounter of 04/12/16 (from the past 48 hour(s))  CBC with Differential     Status: Abnormal   Collection Time: 04/18/16  4:29 AM  Result Value Ref Range   WBC 2.1 (L) 3.6 - 11.0 K/uL   RBC 2.28 (L) 3.80 - 5.20 MIL/uL   Hemoglobin 7.2 (L) 12.0 - 16.0 g/dL   HCT 21.9 (L) 35.0 - 47.0 %   MCV 96.2 80.0 - 100.0 fL   MCH 31.8 26.0 - 34.0 pg   MCHC 33.0 32.0 - 36.0 g/dL   RDW 16.4 (H) 11.5 - 14.5 %   Platelets 12 (LL) 150 - 440  K/uL    Comment: PLATELET COUNT CONFIRMED BY SMEAR CRITICAL VALUE NOTED.  VALUE IS CONSISTENT WITH PREVIOUSLY REPORTED AND CALLED VALUE.    Neutrophils Relative % 63 %   Lymphocytes Relative 25 %   Monocytes Relative 12 %   Eosinophils Relative 0 %   Basophils Relative 0 %   Neutro Abs 1.3 (L) 1.4 - 6.5 K/uL   Lymphs Abs 0.5 (L)  1.0 - 3.6 K/uL   Monocytes Absolute 0.3 0.2 - 0.9 K/uL   Eosinophils Absolute 0.0 0 - 0.7 K/uL   Basophils Absolute 0.0 0 - 0.1 K/uL   RBC Morphology TARGET CELLS     Comment: HYPOCHROMIA MIXED RBC POPULATION    WBC Morphology TOXIC GRANULATION   Magnesium     Status: None   Collection Time: 04/18/16  4:29 AM  Result Value Ref Range   Magnesium 1.9 1.7 - 2.4 mg/dL  Basic metabolic panel     Status: Abnormal   Collection Time: 04/18/16  4:29 AM  Result Value Ref Range   Sodium 137 135 - 145 mmol/L   Potassium 3.2 (L) 3.5 - 5.1 mmol/L   Chloride 110 101 - 111 mmol/L   CO2 20 (L) 22 - 32 mmol/L   Glucose, Bld 116 (H) 65 - 99 mg/dL   BUN 43 (H) 6 - 20 mg/dL   Creatinine, Ser 1.35 (H) 0.44 - 1.00 mg/dL   Calcium 6.2 (LL) 8.9 - 10.3 mg/dL    Comment: CRITICAL RESULT CALLED TO, READ BACK BY AND VERIFIED WITH MONIQUE ROBERSON @ 0534 ON 04/18/2016 BY CAF    GFR calc non Af Amer 39 (L) >60 mL/min   GFR calc Af Amer 45 (L) >60 mL/min    Comment: (NOTE) The eGFR has been calculated using the CKD EPI equation. This calculation has not been validated in all clinical situations. eGFR's persistently <60 mL/min signify possible Chronic Kidney Disease.    Anion gap 7 5 - 15  Heparin induced platelet Ab (HIT antibody)     Status: None   Collection Time: 04/18/16  4:29 AM  Result Value Ref Range   Heparin Induced Plt Ab 0.173 0.000 - 0.400 OD    Comment: (NOTE) Performed At: [facility] [place], [place] [id number] Lindon Romp MD TU:[id number]   Albumin     Status: Abnormal   Collection Time: 04/18/16  4:29 AM  Result Value Ref Range   Albumin 1.2 (L) 3.5 - 5.0 g/dL  Phosphorus     Status: Abnormal   Collection Time: 04/18/16  4:29 AM  Result Value Ref Range   Phosphorus 1.3 (L) 2.5 - 4.6 mg/dL  Potassium     Status: None   Collection Time: 04/18/16  9:18 PM  Result Value Ref Range   Potassium 3.8 3.5 - 5.1 mmol/L  Phosphorus     Status: Abnormal    Collection Time: 04/18/16  9:18 PM  Result Value Ref Range   Phosphorus 1.2 (L) 2.5 - 4.6 mg/dL  CBC with Differential     Status: Abnormal   Collection Time: 04/19/16  4:30 AM  Result Value Ref Range   WBC 2.3 (L) 3.6 - 11.0 K/uL   RBC 2.30 (L) 3.80 - 5.20 MIL/uL   Hemoglobin 7.3 (L) 12.0 - 16.0 g/dL   HCT 21.8 (L) 35.0 - 47.0 %   MCV 95.0 80.0 - 100.0 fL   MCH 31.7 26.0 - 34.0 pg   MCHC 33.3 32.0 - 36.0 g/dL  RDW 16.0 (H) 11.5 - 14.5 %   Platelets 12 (LL) 150 - 440 K/uL    Comment: CRITICAL VALUE NOTED.  VALUE IS CONSISTENT WITH PREVIOUSLY REPORTED AND CALLED VALUE.   Neutrophils Relative % 55 %   Lymphocytes Relative 34 %   Monocytes Relative 5 %   Eosinophils Relative 0 %   Basophils Relative 0 %   Band Neutrophils 5 %   Metamyelocytes Relative 1 %   Myelocytes 0 %   Promyelocytes Absolute 0 %   Blasts 0 %   nRBC 0 0 /100 WBC   Other 0 %   Neutro Abs 1.4 1.4 - 6.5 K/uL   Lymphs Abs 0.8 (L) 1.0 - 3.6 K/uL   Monocytes Absolute 0.1 (L) 0.2 - 0.9 K/uL   Eosinophils Absolute 0.0 0 - 0.7 K/uL   Basophils Absolute 0.0 0 - 0.1 K/uL   RBC Morphology MIXED RBC POPULATION     Comment: HYPOCHROMIA  Basic metabolic panel     Status: Abnormal   Collection Time: 04/19/16  1:11 PM  Result Value Ref Range   Sodium 139 135 - 145 mmol/L   Potassium 3.5 3.5 - 5.1 mmol/L   Chloride 108 101 - 111 mmol/L   CO2 27 22 - 32 mmol/L   Glucose, Bld 143 (H) 65 - 99 mg/dL   BUN 31 (H) 6 - 20 mg/dL   Creatinine, Ser 1.22 (H) 0.44 - 1.00 mg/dL   Calcium 6.3 (LL) 8.9 - 10.3 mg/dL    Comment: CRITICAL RESULT CALLED TO, READ BACK BY AND VERIFIED WITH GINA DENTON 04/19/16 1400 SGD    GFR calc non Af Amer 44 (L) >60 mL/min   GFR calc Af Amer 51 (L) >60 mL/min    Comment: (NOTE) The eGFR has been calculated using the CKD EPI equation. This calculation has not been validated in all clinical situations. eGFR's persistently <60 mL/min signify possible Chronic Kidney Disease.    Anion gap 4 (L) 5 -  15  Magnesium     Status: Abnormal   Collection Time: 04/19/16  1:11 PM  Result Value Ref Range   Magnesium 1.4 (L) 1.7 - 2.4 mg/dL  Phosphorus     Status: Abnormal   Collection Time: 04/19/16  1:11 PM  Result Value Ref Range   Phosphorus 1.4 (L) 2.5 - 4.6 mg/dL   No results found.  Assessment:  [name] is a 70 y.o. female with with recurrent endometrial cancer admitted on day 8 s/p cycle #2 carboplatin and Taxol secondary to acute renal insufficiency secondary to dehydration from poor oral inake, nausea/vomiting, and significant ostomy output. She is improving with IVF. Creatinine has improved from 4.35 to 1.22. Potassium, magnesium, calcium and phosporus are being repleted.  Plan:  1. Oncology: Day 15 s/p cycle #2 carboplatin and Taxol. Neutropenia resolved ([place] 1400).  Platelet count low, but stable (42,000 to 27,000 to 16,000 to 12,000 x 2 days).  Maintain active type and screen.  No bleeding.  Discussed transfusion of blood and/or platelets with patient.  She wishes to make a decision in AM after counts back.  Etiology is due to chemotherapy.  Initial exposure to Lovenox on admission.  HIT assay is negative.  Serotonin release assay pending.  Follow counts daily.  Check retic count.  2. Renal: IVF with bicarb.  Slight improvement in creatinine 4.35 to 1.35.  Bicarb remains improved (27) secondary to GI losses.  Patient on sodium bicarbonate (1300 mg).  Magnesium (1.4)  and  phosphorus (1.4) being supplemented.  Patient on oral calcium.  3. Gastroenterology: Stool output greatly improved on octreotide SQ every 8 hours.  Anticipate outpatient colonoscopy once platelet count recovers.  4.  Infectious disease:  No current issues.  No longer neutropenic.  Afebrile on no antibiotics.  5.  Disposition:  Anticipate discharge tomorrow depending on counts and possible transfusion(s).  Will need follow-up in oncology clinic on 08/21.   Lequita Asal, MD  04/19/2016

## 2016-04-20 NOTE — Progress Notes (Signed)
BP 83/52, P 88. MD notified. Instructed to notify if SBP below 80. Will continue to monitor.

## 2016-04-20 NOTE — Discharge Summary (Addendum)
[facility] [place] NAME: [name]    MR#:  [id number]  DATE OF BIRTH:  1945-12-07  DATE OF ADMISSION:  04/12/2016 ADMITTING PHYSICIAN: [name], MD  DATE OF DISCHARGE: 04/20/16  PRIMARY CARE PHYSICIAN: [name], MD    ADMISSION DIAGNOSIS:  Acute renal failure, unspecified acute renal failure type ([place]) [N17.9]  DISCHARGE DIAGNOSIS:  Acute renal failure due to on going high Colostomy output Electrolyte abnormality with Low K, Phosphorus,magnesium  PAncytopenia due to Chemotherapy TCP due to Chemo (HIT-neg)  SECONDARY DIAGNOSIS:   Past Medical History:  Diagnosis Date  . Cancer [facility]) 2011   endometrial  . Chronic kidney disease    STONES  . Diverticulitis of colon (without mention of hemorrhage)   . History of kidney stones 2011  . Mechanical complication of colostomy and enterostomy   . Obesity, unspecified   . Personal history of tobacco use, presenting hazards to health     HOSPITAL COURSE:  *Acute kidney injury due to severe dehydration from diarrhea Replace IV fluids aggressively.  -replete electrolytes Consult with nephrology appreciated. Baseline creat 0.98 Came in with creat 4.35--3.11---2.13--1.8--1.3--1.1  * Chronic diarrhea worsened by chemotherapy -Nothing else has helped pt with slowing diarrhea other than octreotide. HEr QI interval  Is stable and so SQ octreotide resumed again while in the hospital and cont cardiac monitoring----pt's out has decreased since    *Electrolyte abnormality with hypomagnesimia,hypophosphatemia,hypokalemia -given IV and po  * Endometrial carcinoma Patient follows with Dr. Mike Gip as outpatient  * Neutropenia - due to chemotherapy Neutropenic precautions.   * Hypocalcemia/hypomagnesima/hypokalemia/hypophosphatemia -replete oral and IV  * DVT prophylaxis heparin d/ced since 04/16/15  *thrombocytopenia due to recent chemo Came in with  192----12K--12K--2 unitsPRBC today per dr Mike Gip HIT panel negative Pt will f/u for lab wok on mondy with Dr Mike Gip   Will d/c pt after her plt transfusion  CONSULTS OBTAINED:  Treatment Team:  San Jetty, MD  DRUG ALLERGIES:  No Known Allergies  DISCHARGE MEDICATIONS:   Current Discharge Medication List    START taking these medications   Details  calcium carbonate (TUMS - DOSED IN MG ELEMENTAL CALCIUM) 500 MG chewable tablet Chew 2 tablets (400 mg of elemental calcium total) by mouth 3 (three) times daily with meals. Qty: 90 tablet, Refills: 1    feeding supplement (BOOST / RESOURCE BREEZE) LIQD Take 1 Container by mouth 3 (three) times daily between meals. Qty: 90 Container, Refills: 0    LORazepam (ATIVAN) 0.5 MG tablet Take 1 tablet (0.5 mg total) by mouth every 8 (eight) hours as needed for anxiety. Qty: 30 tablet, Refills: 0    magnesium oxide (MAG-OX) 400 (241.3 Mg) MG tablet Take 1 tablet (400 mg total) by mouth 2 (two) times daily. Qty: 60 tablet, Refills: 1    potassium chloride (KLOR-CON) 20 MEQ packet Take 40 mEq by mouth 2 (two) times daily. Qty: 60 packet, Refills: 1      CONTINUE these medications which have CHANGED   Details  sodium bicarbonate 650 MG tablet Take 2 tablets (1,300 mg total) by mouth 2 (two) times daily. Qty: 60 tablet, Refills: 6      CONTINUE these medications which have NOT CHANGED   Details  octreotide (SANDOSTATIN) 100 MCG/ML SOLN injection Inject 1 mL (100 mcg total) into the skin every 8 (eight) hours. Qty: 13 mL, Refills: 0    ondansetron (ZOFRAN) 8 MG tablet Take 8 mg by mouth 2 (two) times  daily as needed.     promethazine (PHENERGAN) 25 MG tablet Take 1 tablet (25 mg total) by mouth every 8 (eight) hours as needed for nausea or vomiting. Qty: 30 tablet, Refills: 0    Syringe, Disposable, 1 ML MISC 1 Units by Does not apply route 3 (three) times daily. Qty: 25 each, Refills: 0      STOP taking these medications      potassium chloride 20 MEQ TBCR         If you experience worsening of your admission symptoms, develop shortness of breath, life threatening emergency, suicidal or homicidal thoughts you must seek medical attention immediately by calling 911 or calling your MD immediately  if symptoms less severe.  You Must read complete instructions/literature along with all the possible adverse reactions/side effects for all the Medicines you take and that have been prescribed to you. Take any new Medicines after you have completely understood and accept all the possible adverse reactions/side effects.   Please note  You were cared for by a hospitalist during your hospital stay. If you have any questions about your discharge medications or the care you received while you were in the hospital after you are discharged, you can call the unit and asked to speak with the hospitalist on call if the hospitalist that took care of you is not available. Once you are discharged, your primary care physician will handle any further medical issues. Please note that NO REFILLS for any discharge medications will be authorized once you are discharged, as it is imperative that you return to your primary care physician (or establish a relationship with a primary care physician if you do not have one) for your aftercare needs so that they can reassess your need for medications and monitor your lab values. Today   SUBJECTIVE   Feels better today. Requesting to go home VITAL SIGNS:  Blood pressure 94/64, pulse 91, temperature 99 F (37.2 C), temperature source Oral, resp. rate 18, height 5\' 2"  (1.575 m), weight 169 lb 8 oz (76.9 kg), SpO2 100 %.  I/O:   Intake/Output Summary (Last 24 hours) at 04/20/16 1127 Last data filed at 04/20/16 0938  Gross per 24 hour  Intake          1605.82 ml  Output             2775 ml  Net         -1169.18 ml    PHYSICAL EXAMINATION:  GENERAL:  70 y.o.-year-old patient lying in the bed  with no acute distress.  EYES: Pupils equal, round, reactive to light and accommodation. No scleral icterus. Extraocular muscles intact.  HEENT: Head atraumatic, normocephalic. Oropharynx and nasopharynx clear.  NECK:  Supple, no jugular venous distention. No thyroid enlargement, no tenderness.  LUNGS: Normal breath sounds bilaterally, no wheezing, rales,rhonchi or crepitation. No use of accessory muscles of respiration.  CARDIOVASCULAR: S1, S2 normal. No murmurs, rubs, or gallops.  ABDOMEN: Soft, non-tender, non-distended. Bowel sounds present. No organomegaly or mass. Colostomy+ EXTREMITIES: No pedal edema, cyanosis, or clubbing.  NEUROLOGIC: Cranial nerves II through XII are intact. Muscle strength 5/5 in all extremities. Sensation intact. Gait not checked.  PSYCHIATRIC: The patient is alert and oriented x 3.  SKIN: No obvious rash, lesion, or ulcer.   DATA REVIEW:   CBC   Recent Labs Lab 04/20/16 0534  WBC 2.3*  HGB 7.0*  HCT 21.0*  PLT 12*    Chemistries   Recent Labs Lab 04/20/16 0534  NA 140  K 3.8  CL 105  CO2 30  GLUCOSE 117*  BUN 26*  CREATININE 1.11*  CALCIUM 6.3*  MG 1.7    Microbiology Results   No results found for this or any previous visit (from the past 240 hour(s)).  RADIOLOGY:  No results found.   Management plans discussed with the patient, family and they are in agreement.  CODE STATUS:     Code Status Orders        Start     Ordered   04/12/16 1239  Full code  Continuous     04/12/16 1239    Code Status History    Date Active Date Inactive Code Status Order ID Comments User Context   03/25/2016  7:41 PM 03/28/2016  4:21 PM Full Code [id number]  Baxter Hire, MD Inpatient   03/18/2016 11:46 AM 03/23/2016  7:42 PM Full Code [id number]  Lytle Butte, MD ED      TOTAL TIME TAKING CARE OF THIS PATIENT: 40 minutes.    Nazareth Kirk M.D on 04/20/2016 at 11:27 AM  Between 7am to 6pm - Pager - [phone] After 6pm go to [email or web address]  - password [email or web address] [facility]  [facility]  Office  [phone]  CC: Primary care physician; [name], MD

## 2016-04-20 NOTE — Progress Notes (Signed)
MEDICATION RELATED CONSULT NOTE - follow up  Pharmacy Consult for electrolyte monitoring/replacement Indication: hypokalemia  No Known Allergies  Patient Measurements: Height: 5\' 2"  (157.5 cm) Weight: 169 lb 8 oz (76.9 kg) IBW/kg (Calculated) : 50.1  Vital Signs: Temp: 98.2 F (36.8 C) (08/18 0421) Temp Source: Oral (08/18 0421) BP: 86/56 (08/18 0439) Pulse Rate: 88 (08/18 0433) Intake/Output from previous day: 08/17 0701 - 08/18 0700 In: 2162.8 [P.O.:702; I.V.:1002.8; IV Piggyback:458] Out: 3350 [Urine:1000; Stool:2350] Intake/Output from this shift: Total I/O In: -  Out: 300 [Urine:200; Stool:100]  Labs:  Recent Labs  04/18/16 0429 04/18/16 2118 04/19/16 0430 04/19/16 1311 04/20/16 0534  WBC 2.1*  --  2.3*  --  2.3*  HGB 7.2*  --  7.3*  --  7.0*  HCT 21.9*  --  21.8*  --  21.0*  PLT 12*  --  12*  --  12*  CREATININE 1.35*  --   --  1.22* 1.11*  MG 1.9  --   --  1.4* 1.7  PHOS 1.3* 1.2*  --  1.4* 3.0  ALBUMIN 1.2*  --   --   --   --    Lab Results  Component Value Date   K 3.8 04/20/2016   Lab Results  Component Value Date   CALCIUM 6.3 (LL) 04/20/2016   PHOS 3.0 04/20/2016    Estimated Creatinine Clearance: 45.9 mL/min (by C-G formula based on SCr of 1.11 mg/dL).  Assessment: Pharmacy consulted to monitor and replace electrolytes in this 70 year old female with hypokalemia, dehydration. 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 High ostomy output. Patient ordered diet but poor appetite.   Goal of Therapy:  Electrolytes within normal limits  Plan:  All within normal limits except Calcium           Calcium= 6.3,  Albumin= 1.2, **Albumin Corrected Calcium= 8.54** (WNL 8.9-10.3)    Will continue  Calcium carb PO to 2 tabs TID and give  extra dose of 2 tabs PO x1.   Will f/u AM labs.    Larene Beach, PharmD  Clinical Pharmacist 04/20/2016

## 2016-04-20 NOTE — Progress Notes (Signed)
Notified Dr. Posey Pronto and Dr. Mike Gip via telephone that hgb is 8.6 following 1 unit of RBC, per Dr. Posey Pronto inform Dr. Mike Gip, per Dr. Mike Gip the patient is okay to just receive 1 unit but inform her that it is likely she will need another unit next week.

## 2016-04-20 NOTE — Progress Notes (Signed)
[facility] The Hematology/Oncology Progress Note  Date of admission: 04/12/2016  Hospital day:  04/20/2016  Chief Complaint: [name] is a 70 y.o. female with recurrent endometrial cancer who was admitted on day 8 s/p cycle #2 carboplatin and Taxol with acute renal insufficiency.   Subjective:  Feels good.  Ostomy output controlled on short acting octreotide.  No bruising or bleeding.  Social History: The patient is alone today.    Allergies: No Known Allergies  Scheduled Medications: . calcium carbonate  400 mg of elemental calcium Oral TID WC  . feeding supplement  1 Container Oral TID BM  . magnesium oxide  400 mg Oral BID  . octreotide  100 mcg Subcutaneous TID  . potassium chloride  40 mEq Oral BID  . sodium bicarbonate  1,300 mg Oral BID  . sodium chloride flush  3 mL Intravenous Q12H    Review of Systems: GENERAL: Feels fine. No fevers or sweats.  Ready to go home. PERFORMANCE STATUS (ECOG): 2 HEENT: No visual changes, runny nose, sore throat, mouth sores or tenderness. Lungs: No shortness of breath or cough. No hemoptysis. Cardiac: No chest pain, palpitations, orthopnea, or PND. GI: Eating better.  No abdominal pain.  No nausea, vomiting, constipation, melena or hematochezia. GU: No urgency, frequency, dysuria, or hematuria. Musculoskeletal: No back pain. No joint pain. No muscle tenderness. Extremities: No pain or swelling. Skin: No rashes or skin changes. Neuro: General weakness (no change).  No headache, numbness or focal weakness, balance or coordination issues. Endocrine: No diabetes, thyroid issues, hot flashes or night sweats. Psych: No mood changes, depression or anxiety. Pain: No focal pain. Review of systems: All other systems reviewed and found to be negative.  Physical Exam: Blood pressure 101/65, pulse 84, temperature 98.4 F (36.9 C), temperature source Oral, resp. rate 18, height 5' 2"  (1.575 m), weight 169  lb 8 oz (76.9 kg), SpO2 100 %.  GENERAL: Well developed, well nourished, woman sitting up in bed eating breakfast on the medical unit in no acute distress.  MENTAL STATUS: Alert and oriented to person, place and time. HEAD: Short gray hair. Normocephalic, atraumatic, face symmetric, no Cushingoid features. EYES: Blue eyes. Pupils equal round and reactive to light and accomodation. No conjunctivitis or scleral icterus. ENT: Oropharynx clear without lesion. Tongue normal. Mucous membranes moist.  RESPIRATORY: Clear to auscultation without rales, wheezes or rhonchi. CARDIOVASCULAR: Regular rate and rhythm without murmur, rub or gallop. ABDOMEN: Colostomy. Soft, non-tender, with active bowel sounds, and no hepatosplenomegaly. No masses. SKIN: No petechiae.  No ecchymosis.  No rashes, ulcers or lesions. EXTREMITIES: No edema, no skin discoloration or tenderness. No palpable cords. LYMPH NODES: No palpable cervical, supraclavicular, axillary or inguinal adenopathy  NEUROLOGICAL: Unremarkable. PSYCH: Appropriate.   Results for orders placed or performed during the hospital encounter of 04/12/16 (from the past 48 hour(s))  Potassium     Status: None   Collection Time: 04/18/16  9:18 PM  Result Value Ref Range   Potassium 3.8 3.5 - 5.1 mmol/L  Phosphorus     Status: Abnormal   Collection Time: 04/18/16  9:18 PM  Result Value Ref Range   Phosphorus 1.2 (L) 2.5 - 4.6 mg/dL  CBC with Differential     Status: Abnormal   Collection Time: 04/19/16  4:30 AM  Result Value Ref Range   WBC 2.3 (L) 3.6 - 11.0 K/uL   RBC 2.30 (L) 3.80 - 5.20 MIL/uL   Hemoglobin 7.3 (L) 12.0 - 16.0 g/dL  HCT 21.8 (L) 35.0 - 47.0 %   MCV 95.0 80.0 - 100.0 fL   MCH 31.7 26.0 - 34.0 pg   MCHC 33.3 32.0 - 36.0 g/dL   RDW 16.0 (H) 11.5 - 14.5 %   Platelets 12 (LL) 150 - 440 K/uL    Comment: CRITICAL VALUE NOTED.  VALUE IS CONSISTENT WITH PREVIOUSLY REPORTED AND CALLED VALUE.   Neutrophils Relative % 55  %   Lymphocytes Relative 34 %   Monocytes Relative 5 %   Eosinophils Relative 0 %   Basophils Relative 0 %   Band Neutrophils 5 %   Metamyelocytes Relative 1 %   Myelocytes 0 %   Promyelocytes Absolute 0 %   Blasts 0 %   nRBC 0 0 /100 WBC   Other 0 %   Neutro Abs 1.4 1.4 - 6.5 K/uL   Lymphs Abs 0.8 (L) 1.0 - 3.6 K/uL   Monocytes Absolute 0.1 (L) 0.2 - 0.9 K/uL   Eosinophils Absolute 0.0 0 - 0.7 K/uL   Basophils Absolute 0.0 0 - 0.1 K/uL   RBC Morphology MIXED RBC POPULATION     Comment: HYPOCHROMIA  Pathologist smear review     Status: None   Collection Time: 04/19/16  4:30 AM  Result Value Ref Range   Path Review      Peripheral smear reviewed. Patient undergoing chemotherapy for endometrial carcinoma.    Comment: Pancytopenia, consistent with recent chemotherapy. Reviewed by Dellia Nims Reuel Derby, M.D.   Basic metabolic panel     Status: Abnormal   Collection Time: 04/19/16  1:11 PM  Result Value Ref Range   Sodium 139 135 - 145 mmol/L   Potassium 3.5 3.5 - 5.1 mmol/L   Chloride 108 101 - 111 mmol/L   CO2 27 22 - 32 mmol/L   Glucose, Bld 143 (H) 65 - 99 mg/dL   BUN 31 (H) 6 - 20 mg/dL   Creatinine, Ser 1.22 (H) 0.44 - 1.00 mg/dL   Calcium 6.3 (LL) 8.9 - 10.3 mg/dL    Comment: CRITICAL RESULT CALLED TO, READ BACK BY AND VERIFIED WITH GINA DENTON 04/19/16 1400 SGD    GFR calc non Af Amer 44 (L) >60 mL/min   GFR calc Af Amer 51 (L) >60 mL/min    Comment: (NOTE) The eGFR has been calculated using the CKD EPI equation. This calculation has not been validated in all clinical situations. eGFR's persistently <60 mL/min signify possible Chronic Kidney Disease.    Anion gap 4 (L) 5 - 15  Magnesium     Status: Abnormal   Collection Time: 04/19/16  1:11 PM  Result Value Ref Range   Magnesium 1.4 (L) 1.7 - 2.4 mg/dL  Phosphorus     Status: Abnormal   Collection Time: 04/19/16  1:11 PM  Result Value Ref Range   Phosphorus 1.4 (L) 2.5 - 4.6 mg/dL  CBC with Differential      Status: Abnormal   Collection Time: 04/20/16  5:34 AM  Result Value Ref Range   WBC 2.3 (L) 3.6 - 11.0 K/uL   RBC 2.22 (L) 3.80 - 5.20 MIL/uL   Hemoglobin 7.0 (L) 12.0 - 16.0 g/dL   HCT 21.0 (L) 35.0 - 47.0 %   MCV 94.7 80.0 - 100.0 fL   MCH 31.6 26.0 - 34.0 pg   MCHC 33.4 32.0 - 36.0 g/dL   RDW 15.6 (H) 11.5 - 14.5 %   Platelets 12 (LL) 150 - 440 K/uL    Comment: CRITICAL  VALUE NOTED.  VALUE IS CONSISTENT WITH PREVIOUSLY REPORTED AND CALLED VALUE.   Neutrophils Relative % 56 %   Lymphocytes Relative 28 %   Monocytes Relative 12 %   Eosinophils Relative 0 %   Basophils Relative 0 %   Band Neutrophils 0 %   Metamyelocytes Relative 4 %   Myelocytes 0 %   Promyelocytes Absolute 0 %   Blasts 0 %   nRBC 0 0 /100 WBC   Other 0 %   Neutro Abs 1.4 1.4 - 6.5 K/uL   Lymphs Abs 0.6 (L) 1.0 - 3.6 K/uL   Monocytes Absolute 0.3 0.2 - 0.9 K/uL   Eosinophils Absolute 0.0 0 - 0.7 K/uL   Basophils Absolute 0.0 0 - 0.1 K/uL   RBC Morphology MIXED RBC POPULATION     Comment: HYPOCHROMIA POLYCHROMASIA PRESENT   Basic metabolic panel     Status: Abnormal   Collection Time: 04/20/16  5:34 AM  Result Value Ref Range   Sodium 140 135 - 145 mmol/L   Potassium 3.8 3.5 - 5.1 mmol/L   Chloride 105 101 - 111 mmol/L   CO2 30 22 - 32 mmol/L   Glucose, Bld 117 (H) 65 - 99 mg/dL   BUN 26 (H) 6 - 20 mg/dL   Creatinine, Ser 1.11 (H) 0.44 - 1.00 mg/dL   Calcium 6.3 (LL) 8.9 - 10.3 mg/dL    Comment: CRITICAL RESULT CALLED TO, READ BACK BY AND VERIFIED WITH STACEY CLAY ON 04/20/16 AT 0625 QSD    GFR calc non Af Amer 49 (L) >60 mL/min   GFR calc Af Amer 57 (L) >60 mL/min    Comment: (NOTE) The eGFR has been calculated using the CKD EPI equation. This calculation has not been validated in all clinical situations. eGFR's persistently <60 mL/min signify possible Chronic Kidney Disease.    Anion gap 5 5 - 15  Magnesium     Status: None   Collection Time: 04/20/16  5:34 AM  Result Value Ref Range    Magnesium 1.7 1.7 - 2.4 mg/dL  Phosphorus     Status: None   Collection Time: 04/20/16  5:34 AM  Result Value Ref Range   Phosphorus 3.0 2.5 - 4.6 mg/dL  Type and screen [place]     Status: None (Preliminary result)   Collection Time: 04/20/16  5:34 AM  Result Value Ref Range   ABO/RH(D) O POS    Antibody Screen NEG    Sample Expiration 04/23/2016    Unit Number [id number]    Blood Component Type RED CELLS,LR    Unit division 00    Status of Unit ISSUED    Transfusion Status OK TO TRANSFUSE    Crossmatch Result Compatible   Reticulocytes     Status: Abnormal   Collection Time: 04/20/16  5:34 AM  Result Value Ref Range   Retic Ct Pct 0.3 (L) 0.4 - 3.1 %   RBC. 2.22 (L) 3.80 - 5.20 MIL/uL   Retic Count, Manual 6.7 (L) 19.0 - 183.0 K/uL  Prepare RBC     Status: None   Collection Time: 04/20/16  9:45 AM  Result Value Ref Range   Order Confirmation ORDER PROCESSED BY BLOOD BANK   Hemoglobin and hematocrit, blood     Status: Abnormal   Collection Time: 04/20/16  2:13 PM  Result Value Ref Range   Hemoglobin 8.6 (L) 12.0 - 16.0 g/dL   HCT 25.7 (L) 35.0 - 47.0 %   No  results found.  Assessment:  [name] is a 70 y.o. female with with recurrent endometrial cancer admitted on day 8 s/p cycle #2 carboplatin and Taxol secondary to acute renal insufficiency secondary to dehydration from poor oral inake, nausea/vomiting, and significant ostomy output. She is improving with IVF. Creatinine has improved from 4.35 to 1.22. Potassium, magnesium, calcium and phosporus are being repleted.  Plan:  1. Oncology: Day 16 s/p cycle #2 carboplatin and Taxol. Neutropenia resolved ([place] 1400).  Platelet count low, but stable (42,000 to 27,000 to 16,000 to 12,000 x 3 days). No bleeding.  Discussed transfusion of blood and/or platelets with patient today. Retic count is low (0.3%) and thus anticipate hemoglobin will continue to drift down.  She agreeable to only  receive PRBCs.  We discussed transfusion of 2 units PRBCs.  She declined platelet transfusion.  Patient aware of low platelet count and potential for bleeding.  She would require transfusion of platelets if any significant bleeding.  Etiology of anemia and thrombocytopenia secondary to chemotherapy.   Initial exposure to Lovenox on admission.  HIT assay is negative.  Serotonin release assay pending.  Plan to check CBC on 04/23/2016 in clinic.  2. Renal: Continued improvement in creatinine 4.35 to 1.11.  Bicarb improved (30).  Patient on sodium bicarbonate (1300 mg).  Potassium, magnesium, and phosphorus normal.  Patient on oral calcium.  3. Gastroenterology: Stool output controlled with octreotide 100 mcg SQ every 8 hours.  Patient states that she has short acting octreotide at home.  Anticipate outpatient colonoscopy with Dr. Allen Norris once platelet count recovers.  4.  Disposition:  Anticipate discharge today post transfusion.  Patient has lab appointment on 04/23/2016 and visit at end of next week.  She was encouraged to call our office over the weekend if any concerns.   Lequita Asal, MD  04/20/2016, 5:46 PM

## 2016-04-20 NOTE — Progress Notes (Signed)
Subjective:  Patient had significant stool output yesterday of 2.3 L. Good urine output noted of 1 L. Creatinine currently 1.1. Still quite thrombocytopenic with platelet count of 12,000.  Objective:  Vital signs in last 24 hours:  Temp:  [98.1 F (36.7 C)-99.5 F (37.5 C)] 98.2 F (36.8 C) (08/18 0421) Pulse Rate:  [54-99] 88 (08/18 0433) Resp:  [18-20] 18 (08/18 0421) BP: (63-126)/(51-67) 86/56 (08/18 0439) SpO2:  [95 %-100 %] 100 % (08/18 0421) Weight:  [76.7 kg (169 lb)-76.9 kg (169 lb 8 oz)] 76.9 kg (169 lb 8 oz) (08/18 0421)  Weight change: -0.045 kg (-1.6 oz) Filed Weights   04/19/16 0429 04/19/16 1708 04/20/16 0421  Weight: 76.7 kg (169 lb 1.6 oz) 76.7 kg (169 lb) 76.9 kg (169 lb 8 oz)    Intake/Output:    Intake/Output Summary (Last 24 hours) at 04/20/16 1104 Last data filed at 04/20/16 [id number]  Gross per 24 hour  Intake          1878.82 ml  Output             2775 ml  Net          -896.18 ml     Physical Exam: General: No acute distress, laying in the bed   HEENT Moist oral mucous membranes   Neck Supple   Pulm/lungs Normal breathing effort, CTAB  CVS/Heart S1S2 no rubs  Abdomen:  Soft, nontender, nondistended, colostomy in place   Extremities: No peripheral edema   Neurologic: Alert, oriented   Skin: Scaly lesions over bilateral legs           Basic Metabolic Panel:   Recent Labs Lab 04/15/16 0519 04/16/16 0436 04/17/16 0500 04/17/16 1253 04/17/16 1819 04/18/16 0429 04/18/16 2118 04/19/16 1311 04/20/16 0534  NA 139 139  --  137  --  137  --  139 140  K 3.3* 4.1 3.0* 3.6 3.4* 3.2* 3.8 3.5 3.8  CL 120* 124*  --  114*  --  110  --  108 105  CO2 11* 10*  --  13*  --  20*  --  27 30  GLUCOSE 88 81  --  119*  --  116*  --  143* 117*  BUN 64* 62*  --  49*  --  43*  --  31* 26*  CREATININE 2.13* 1.80*  --  1.53*  --  1.35*  --  1.22* 1.11*  CALCIUM 6.8* 7.2*  --  6.9*  --  6.2*  --  6.3* 6.3*  MG 1.6* 1.9 1.4*  --  2.1 1.9  --  1.4* 1.7  PHOS  2.8  --   --   --   --  1.3* 1.2* 1.4* 3.0     CBC:  Recent Labs Lab 04/16/16 0436 04/17/16 0500 04/18/16 0429 04/19/16 0430 04/20/16 0534  WBC 3.1* 2.8* 2.1* 2.3* 2.3*  NEUTROABS 2.2 2.2 1.3* 1.4 1.4  HGB 9.0* 8.3* 7.2* 7.3* 7.0*  HCT 27.1* 24.6* 21.9* 21.8* 21.0*  MCV 98.1 95.7 96.2 95.0 94.7  PLT 27* 16* 12* 12* 12*      Microbiology:  Recent Results (from the past 720 hour(s))  C difficile quick scan w PCR reflex     Status: None   Collection Time: 03/25/16  4:53 PM  Result Value Ref Range Status   C Diff antigen NEGATIVE NEGATIVE Final   C Diff toxin NEGATIVE NEGATIVE Final   C Diff interpretation No C. difficile detected.  Final  Urine culture     Status: Abnormal   Collection Time: 03/26/16  2:28 PM  Result Value Ref Range Status   Specimen Description URINE, CATHETERIZED  Final   Special Requests NONE  Final   Culture MULTIPLE SPECIES PRESENT, SUGGEST RECOLLECTION (A)  Final   Report Status 03/27/2016 FINAL  Final  Gastrointestinal Panel by PCR , Stool     Status: None   Collection Time: 03/26/16  4:47 PM  Result Value Ref Range Status   Campylobacter species NOT DETECTED NOT DETECTED Final   Plesimonas shigelloides NOT DETECTED NOT DETECTED Final   Salmonella species NOT DETECTED NOT DETECTED Final   Yersinia enterocolitica NOT DETECTED NOT DETECTED Final   Vibrio species NOT DETECTED NOT DETECTED Final   Vibrio cholerae NOT DETECTED NOT DETECTED Final   Enteroaggregative E coli (EAEC) NOT DETECTED NOT DETECTED Final   Enteropathogenic E coli (EPEC) NOT DETECTED NOT DETECTED Final   Enterotoxigenic E coli (ETEC) NOT DETECTED NOT DETECTED Final   Shiga like toxin producing E coli (STEC) NOT DETECTED NOT DETECTED Final   E. coli O157 NOT DETECTED NOT DETECTED Final   Shigella/Enteroinvasive E coli (EIEC) NOT DETECTED NOT DETECTED Final   Cryptosporidium NOT DETECTED NOT DETECTED Final   Cyclospora cayetanensis NOT DETECTED NOT DETECTED Final   Entamoeba  histolytica NOT DETECTED NOT DETECTED Final   Giardia lamblia NOT DETECTED NOT DETECTED Final   Adenovirus F40/41 NOT DETECTED NOT DETECTED Final   Astrovirus NOT DETECTED NOT DETECTED Final   Norovirus GI/GII NOT DETECTED NOT DETECTED Final   Rotavirus A NOT DETECTED NOT DETECTED Final   Sapovirus (I, II, IV, and V) NOT DETECTED NOT DETECTED Final  Urine culture     Status: None   Collection Time: 03/28/16  8:52 AM  Result Value Ref Range Status   Specimen Description URINE, CATHETERIZED  Final   Special Requests NONE  Final   Culture NO GROWTH Performed at [facility]   Final   Report Status 03/29/2016 FINAL  Final    Coagulation Studies: No results for input(s): LABPROT, INR in the last 72 hours.  Urinalysis: No results for input(s): COLORURINE, LABSPEC, PHURINE, GLUCOSEU, HGBUR, BILIRUBINUR, KETONESUR, PROTEINUR, UROBILINOGEN, NITRITE, LEUKOCYTESUR in the last 72 hours.  Invalid input(s): APPERANCEUR    Imaging: No results found.   Medications:   .  sodium bicarbonate 150 mEq in sterile water 1000 mL infusion 50 mL/hr at 04/20/16 0112   . sodium chloride  250 mL Intravenous Once  . acetaminophen  650 mg Oral Once  . calcium carbonate  400 mg of elemental calcium Oral TID WC  . calcium carbonate  400 mg of elemental calcium Oral NOW  . diphenhydrAMINE  25 mg Oral Once  . feeding supplement  1 Container Oral TID BM  . magnesium oxide  400 mg Oral BID  . octreotide  100 mcg Subcutaneous TID  . sodium bicarbonate  1,300 mg Oral BID  . sodium chloride flush  3 mL Intravenous Q12H   acetaminophen **OR** acetaminophen, albuterol, diphenoxylate-atropine, heparin lock flush, heparin lock flush, LORazepam, ondansetron **OR** ondansetron (ZOFRAN) IV, oxyCODONE, phenol, promethazine, sodium chloride flush, sodium chloride flush  Assessment/ Plan:  70 y.o. female with medical problems of Endometrial cancer undergoing chemotherapy, history of diverticulitis, colostomy,  who was admitted to [facility] on 04/12/2016 for evaluation of increased diarrhea x 3 days, weakness, acute renal failure  1. Acute renal failure, nonoliguric Likely secondary to severe dehydration from increased colostomy output Baseline serum  creatinine is 0.98 from August 3 Admission creatinine 4.35 - Creatinine down to 1.1 which is close to her baseline. I advised the patient that she would need to drink at least as much as her ostomy output. She verbalizes understanding of this.  2. Hypokalemia Likely secondary to nutritional deficiencies as well as increased urine output -Potassium stable at 3.8. Continue to monitor as an outpatient.  3. Acidosis -  Serum bicarbonate up to 30. Continue sodium bicarbonate 1300 mg by mouth twice a day she continues to have significant stool output.  4. Hypocalcemia Calcium 6.2 with an albumin of 1.2. Continue calcium carbonate.  5.  Dr. Candiss Norse to follow up with pt in 1-2 weeks.    LOS: 8 Monnie Anspach 8/18/201711:04 AM

## 2016-04-20 NOTE — Progress Notes (Signed)
Alert and oriented, denies pain, up to bsc, colostomy emptied via nursing staff, good appetite, 1 unit of blood given with improvement in hgb, per Dr. Mike Gip okay to d/c patient after 1 unit, pt to f/u on 8/23 for labs and 8/25 to see MD at [facility], per Dr. Mike Gip patient will not agree to platelets. Husband at bedside at time of d/c instructions and updated on medications and plan, RX faxed to [facility] in [place], patient and husband report understanding of d/c instructions and have no further questions at this time. Pushed to visitor entrance via nursing staff. No s/s of bleeding throughout shift.

## 2016-04-20 NOTE — Care Management Important Message (Signed)
Important Message  Patient Details  Name: [name] MRN: [id number] Date of Birth: [birth year]   Medicare Important Message Given:  Yes    Beverly Sessions, RN 04/20/2016, 12:10 PM

## 2016-04-21 LAB — SEROTONIN RELEASE ASSAY (SRA)
SRA .2 IU/mL UFH Ser-aCnc: 2 % (ref 0–20)
SRA 100IU/mL UFH Ser-aCnc: 1 % (ref 0–20)

## 2016-04-22 LAB — TYPE AND SCREEN
ABO/RH(D): O POS
Antibody Screen: NEGATIVE
Unit division: 0

## 2016-04-23 ENCOUNTER — Other Ambulatory Visit: Payer: Self-pay | Admitting: *Deleted

## 2016-04-23 ENCOUNTER — Telehealth: Payer: Self-pay | Admitting: *Deleted

## 2016-04-23 ENCOUNTER — Other Ambulatory Visit: Payer: Self-pay

## 2016-04-23 ENCOUNTER — Inpatient Hospital Stay: Payer: Medicare Other | Admitting: Hematology and Oncology

## 2016-04-23 ENCOUNTER — Inpatient Hospital Stay: Payer: Medicare Other

## 2016-04-23 DIAGNOSIS — C541 Malignant neoplasm of endometrium: Secondary | ICD-10-CM

## 2016-04-23 DIAGNOSIS — K9403 Colostomy malfunction: Secondary | ICD-10-CM

## 2016-04-23 LAB — CBC WITH DIFFERENTIAL/PLATELET
Basophils Absolute: 0 10*3/uL (ref 0–0.1)
Basophils Relative: 0 %
Eosinophils Absolute: 0 10*3/uL (ref 0–0.7)
Eosinophils Relative: 0 %
HCT: 30.8 % — ABNORMAL LOW (ref 35.0–47.0)
Hemoglobin: 10.2 g/dL — ABNORMAL LOW (ref 12.0–16.0)
Lymphocytes Relative: 28 %
Lymphs Abs: 0.9 10*3/uL — ABNORMAL LOW (ref 1.0–3.6)
MCH: 31.9 pg (ref 26.0–34.0)
MCHC: 33 g/dL (ref 32.0–36.0)
MCV: 96.6 fL (ref 80.0–100.0)
Monocytes Absolute: 0.6 10*3/uL (ref 0.2–0.9)
Monocytes Relative: 18 %
Neutro Abs: 1.8 10*3/uL (ref 1.4–6.5)
Neutrophils Relative %: 54 %
Platelets: 96 10*3/uL — ABNORMAL LOW (ref 150–440)
RBC: 3.19 MIL/uL — ABNORMAL LOW (ref 3.80–5.20)
RDW: 15.8 % — ABNORMAL HIGH (ref 11.5–14.5)
WBC: 3.4 10*3/uL — ABNORMAL LOW (ref 3.6–11.0)

## 2016-04-23 LAB — COMPREHENSIVE METABOLIC PANEL
ALT: 25 U/L (ref 14–54)
AST: 29 U/L (ref 15–41)
Albumin: 1.4 g/dL — ABNORMAL LOW (ref 3.5–5.0)
Alkaline Phosphatase: 147 U/L — ABNORMAL HIGH (ref 38–126)
Anion gap: 7 (ref 5–15)
BUN: 20 mg/dL (ref 6–20)
CO2: 27 mmol/L (ref 22–32)
Calcium: 7 mg/dL — ABNORMAL LOW (ref 8.9–10.3)
Chloride: 103 mmol/L (ref 101–111)
Creatinine, Ser: 1.34 mg/dL — ABNORMAL HIGH (ref 0.44–1.00)
GFR calc Af Amer: 46 mL/min — ABNORMAL LOW (ref >60)
GFR calc non Af Amer: 39 mL/min — ABNORMAL LOW (ref >60)
Glucose, Bld: 145 mg/dL — ABNORMAL HIGH (ref 65–99)
Potassium: 4.4 mmol/L (ref 3.5–5.1)
Sodium: 137 mmol/L (ref 135–145)
Total Bilirubin: 0.7 mg/dL (ref 0.3–1.2)
Total Protein: 4.7 g/dL — ABNORMAL LOW (ref 6.5–8.1)

## 2016-04-23 LAB — MAGNESIUM: Magnesium: 1.2 mg/dL — ABNORMAL LOW (ref 1.7–2.4)

## 2016-04-23 MED ORDER — MAGNESIUM SULFATE 2 GM/50ML IV SOLN: 2.0000 g | Freq: Once | INTRAVENOUS | Status: AC

## 2016-04-23 NOTE — Telephone Encounter (Signed)
-----   Message from Lequita Asal, MD sent at 04/23/2016 12:23 PM EDT ----- Regarding: Needs IV magnesium  Needs IV magnesium.  Can she get in [place] or [place]?  M  ----- Message ----- From: Interface, Lab In [place] Sent: 04/23/2016  11:13 AM To: Lequita Asal, MD

## 2016-04-23 NOTE — Telephone Encounter (Signed)
Husband called me back and said he would bring pt tom. Am at 11 am.  He also notes that home health nurse came out and pt has a rash and needs cream for it. He wanted to see if nurse would look at it tom.  I told him that dr Mike Gip team is not there tom. But we will be there on wed.  He could ask the infusion nurse to look at it and call us in [place].

## 2016-04-23 NOTE — Telephone Encounter (Signed)
Called pt and got her voicemail and left message that her mag was low today and woul dlike her to come to [place] office 11 am tom. Tues. And get 1 hour of mag.  Please call back with got my message and can go.

## 2016-04-24 ENCOUNTER — Inpatient Hospital Stay: Payer: Medicare Other

## 2016-04-24 ENCOUNTER — Telehealth: Payer: Self-pay | Admitting: *Deleted

## 2016-04-24 DIAGNOSIS — E612 Magnesium deficiency: Secondary | ICD-10-CM

## 2016-04-24 MED ORDER — SODIUM CHLORIDE 0.9% FLUSH
10.0000 mL | INTRAVENOUS | Status: DC | PRN
Start: 2016-04-24 — End: 2016-04-24
  Administered 2016-04-24: 10 mL via INTRAVENOUS
  Filled 2016-04-24: qty 10

## 2016-04-24 MED ORDER — SODIUM CHLORIDE 0.9 % IV SOLN
Freq: Once | INTRAVENOUS | Status: AC
  Administered 2016-04-24: 11:00:00 via INTRAVENOUS
  Filled 2016-04-24: qty 1000

## 2016-04-24 MED ORDER — HEPARIN SOD (PORK) LOCK FLUSH 100 UNIT/ML IV SOLN
500.0000 [IU] | Freq: Once | INTRAVENOUS | Status: AC
  Administered 2016-04-24: 500 [IU] via INTRAVENOUS
  Filled 2016-04-24: qty 5

## 2016-04-24 MED ORDER — MAGNESIUM SULFATE 2 GM/50ML IV SOLN
2.0000 g | Freq: Once | INTRAVENOUS | Status: AC
  Administered 2016-04-24: 2 g via INTRAVENOUS
  Filled 2016-04-24: qty 50

## 2016-04-24 NOTE — Telephone Encounter (Signed)
-----   Message from Lequita Asal, MD sent at 04/23/2016 12:23 PM EDT ----- Regarding: Needs IV magnesium  Needs IV magnesium.  Can she get in [place] or [place]?  M  ----- Message ----- From: Interface, Lab In [place] Sent: 04/23/2016  11:13 AM To: Lequita Asal, MD

## 2016-04-24 NOTE — Telephone Encounter (Signed)
Per Dr Mike Gip ok to continue PT. Toney Reil informed and repeated back to me

## 2016-04-24 NOTE — Telephone Encounter (Signed)
Asking for verbal order to continue Home PT three times a week for 2 weeks than 2 times a week for 3 weeks for ambulation and treatment. Please call [phone] with order

## 2016-04-25 ENCOUNTER — Inpatient Hospital Stay
Admission: AD | Admit: 2016-04-25 | Discharge: 2016-05-01 | DRG: 844 | Disposition: A | Discharge status: Home / Self Care | Payer: Medicare Other | Source: Ambulatory Visit | Attending: Internal Medicine | Admitting: Internal Medicine

## 2016-04-25 ENCOUNTER — Inpatient Hospital Stay (HOSPITAL_BASED_OUTPATIENT_CLINIC_OR_DEPARTMENT_OTHER): Payer: Medicare Other | Admitting: Hematology and Oncology

## 2016-04-25 ENCOUNTER — Encounter: Payer: Self-pay | Admitting: Hematology and Oncology

## 2016-04-25 VITALS — BP 98/62 | HR 94 | Temp 98.6°F | Resp 18 | Wt 169.0 lb

## 2016-04-25 DIAGNOSIS — E860 Dehydration: Secondary | ICD-10-CM | POA: Diagnosis present

## 2016-04-25 DIAGNOSIS — D6959 Other secondary thrombocytopenia: Secondary | ICD-10-CM | POA: Diagnosis present

## 2016-04-25 DIAGNOSIS — C541 Malignant neoplasm of endometrium: Secondary | ICD-10-CM | POA: Diagnosis present

## 2016-04-25 DIAGNOSIS — K529 Noninfective gastroenteritis and colitis, unspecified: Secondary | ICD-10-CM | POA: Diagnosis present

## 2016-04-25 DIAGNOSIS — Z9049 Acquired absence of other specified parts of digestive tract: Secondary | ICD-10-CM | POA: Diagnosis not present

## 2016-04-25 DIAGNOSIS — G629 Polyneuropathy, unspecified: Secondary | ICD-10-CM | POA: Diagnosis not present

## 2016-04-25 DIAGNOSIS — I959 Hypotension, unspecified: Secondary | ICD-10-CM | POA: Diagnosis present

## 2016-04-25 DIAGNOSIS — M7989 Other specified soft tissue disorders: Secondary | ICD-10-CM

## 2016-04-25 DIAGNOSIS — D123 Benign neoplasm of transverse colon: Secondary | ICD-10-CM

## 2016-04-25 DIAGNOSIS — R601 Generalized edema: Secondary | ICD-10-CM | POA: Diagnosis not present

## 2016-04-25 DIAGNOSIS — Z800 Family history of malignant neoplasm of digestive organs: Secondary | ICD-10-CM

## 2016-04-25 DIAGNOSIS — Z90710 Acquired absence of both cervix and uterus: Secondary | ICD-10-CM

## 2016-04-25 DIAGNOSIS — E8809 Other disorders of plasma-protein metabolism, not elsewhere classified: Principal | ICD-10-CM | POA: Diagnosis present

## 2016-04-25 DIAGNOSIS — Z79899 Other long term (current) drug therapy: Secondary | ICD-10-CM

## 2016-04-25 DIAGNOSIS — Z85828 Personal history of other malignant neoplasm of skin: Secondary | ICD-10-CM

## 2016-04-25 DIAGNOSIS — F17210 Nicotine dependence, cigarettes, uncomplicated: Secondary | ICD-10-CM

## 2016-04-25 DIAGNOSIS — E861 Hypovolemia: Secondary | ICD-10-CM | POA: Diagnosis present

## 2016-04-25 DIAGNOSIS — C779 Secondary and unspecified malignant neoplasm of lymph node, unspecified: Secondary | ICD-10-CM | POA: Diagnosis not present

## 2016-04-25 DIAGNOSIS — Z933 Colostomy status: Secondary | ICD-10-CM

## 2016-04-25 DIAGNOSIS — R197 Diarrhea, unspecified: Secondary | ICD-10-CM

## 2016-04-25 DIAGNOSIS — Z6826 Body mass index (BMI) 26.0-26.9, adult: Secondary | ICD-10-CM

## 2016-04-25 DIAGNOSIS — E876 Hypokalemia: Secondary | ICD-10-CM | POA: Diagnosis present

## 2016-04-25 DIAGNOSIS — E46 Unspecified protein-calorie malnutrition: Secondary | ICD-10-CM | POA: Insufficient documentation

## 2016-04-25 DIAGNOSIS — N183 Chronic kidney disease, stage 3 (moderate): Secondary | ICD-10-CM | POA: Diagnosis present

## 2016-04-25 DIAGNOSIS — Z803 Family history of malignant neoplasm of breast: Secondary | ICD-10-CM | POA: Diagnosis not present

## 2016-04-25 DIAGNOSIS — E669 Obesity, unspecified: Secondary | ICD-10-CM | POA: Diagnosis present

## 2016-04-25 DIAGNOSIS — R7989 Other specified abnormal findings of blood chemistry: Secondary | ICD-10-CM | POA: Diagnosis present

## 2016-04-25 DIAGNOSIS — T451X5A Adverse effect of antineoplastic and immunosuppressive drugs, initial encounter: Secondary | ICD-10-CM | POA: Diagnosis present

## 2016-04-25 DIAGNOSIS — Z9889 Other specified postprocedural states: Secondary | ICD-10-CM | POA: Diagnosis not present

## 2016-04-25 DIAGNOSIS — Z9221 Personal history of antineoplastic chemotherapy: Secondary | ICD-10-CM | POA: Diagnosis not present

## 2016-04-25 DIAGNOSIS — R609 Edema, unspecified: Secondary | ICD-10-CM

## 2016-04-25 DIAGNOSIS — N179 Acute kidney failure, unspecified: Secondary | ICD-10-CM | POA: Diagnosis present

## 2016-04-25 DIAGNOSIS — N189 Chronic kidney disease, unspecified: Secondary | ICD-10-CM

## 2016-04-25 DIAGNOSIS — R5381 Other malaise: Secondary | ICD-10-CM

## 2016-04-25 LAB — BASIC METABOLIC PANEL
ANION GAP: 7 (ref 5–15)
BUN: 24 mg/dL — ABNORMAL HIGH (ref 6–20)
CO2: 27 mmol/L (ref 22–32)
Calcium: 7.1 mg/dL — ABNORMAL LOW (ref 8.9–10.3)
Chloride: 104 mmol/L (ref 101–111)
Creatinine, Ser: 1.4 mg/dL — ABNORMAL HIGH (ref 0.44–1.00)
GFR calc Af Amer: 43 mL/min — ABNORMAL LOW (ref >60)
GFR, EST NON AFRICAN AMERICAN: 37 mL/min — AB (ref >60)
GLUCOSE: 154 mg/dL — AB (ref 65–99)
POTASSIUM: 3.3 mmol/L — AB (ref 3.5–5.1)
Sodium: 138 mmol/L (ref 135–145)

## 2016-04-25 LAB — MAGNESIUM: Magnesium: 1.6 mg/dL — ABNORMAL LOW (ref 1.7–2.4)

## 2016-04-25 MED ORDER — ALBUMIN HUMAN 25 % IV SOLN
25.0000 g | Freq: Two times a day (BID) | INTRAVENOUS | Status: AC
  Administered 2016-04-25 – 2016-04-27 (×5): 25 g via INTRAVENOUS
  Filled 2016-04-25 (×7): qty 100

## 2016-04-25 MED ORDER — ACETAMINOPHEN 325 MG PO TABS
650.0000 mg | ORAL_TABLET | Freq: Four times a day (QID) | ORAL | Status: DC | PRN
  Administered 2016-04-28: 14:00:00 650 mg via ORAL
  Filled 2016-04-25: qty 2

## 2016-04-25 MED ORDER — PROMETHAZINE HCL 25 MG PO TABS: 25.0000 mg | ORAL_TABLET | Freq: Three times a day (TID) | ORAL | Status: DC | PRN

## 2016-04-25 MED ORDER — ACETAMINOPHEN 650 MG RE SUPP: 650.0000 mg | Freq: Four times a day (QID) | RECTAL | Status: DC | PRN

## 2016-04-25 MED ORDER — SODIUM BICARBONATE 650 MG PO TABS
1300.0000 mg | ORAL_TABLET | Freq: Two times a day (BID) | ORAL | Status: DC
  Administered 2016-04-25 – 2016-04-27 (×4): 1300 mg via ORAL
  Filled 2016-04-25 (×4): qty 2

## 2016-04-25 MED ORDER — MAGNESIUM SULFATE 4 GM/100ML IV SOLN: 4.0000 g | Freq: Once | INTRAVENOUS | Status: DC

## 2016-04-25 MED ORDER — POTASSIUM CHLORIDE 20 MEQ PO PACK
20.0000 meq | PACK | Freq: Once | ORAL | Status: AC
  Administered 2016-04-25: 20 meq via ORAL
  Filled 2016-04-25: qty 1

## 2016-04-25 MED ORDER — OCTREOTIDE ACETATE 100 MCG/ML IJ SOLN
100.0000 ug | Freq: Three times a day (TID) | INTRAMUSCULAR | Status: DC
  Administered 2016-04-25 – 2016-04-26 (×3): 100 ug via SUBCUTANEOUS
  Filled 2016-04-25 (×3): qty 1

## 2016-04-25 MED ORDER — LORAZEPAM 0.5 MG PO TABS
0.5000 mg | ORAL_TABLET | Freq: Three times a day (TID) | ORAL | Status: DC | PRN
  Administered 2016-04-26: 0.5 mg via ORAL
  Filled 2016-04-25: qty 1

## 2016-04-25 MED ORDER — MAGNESIUM SULFATE 2 GM/50ML IV SOLN
2.0000 g | Freq: Once | INTRAVENOUS | Status: AC
  Administered 2016-04-25: 2 g via INTRAVENOUS
  Filled 2016-04-25: qty 50

## 2016-04-25 MED ORDER — SODIUM CHLORIDE 0.9 % IV SOLN: INTRAVENOUS | Status: DC

## 2016-04-25 MED ORDER — ONDANSETRON HCL 4 MG PO TABS
8.0000 mg | ORAL_TABLET | Freq: Three times a day (TID) | ORAL | Status: DC | PRN
  Administered 2016-04-29 – 2016-04-30 (×2): 8 mg via ORAL
  Filled 2016-04-25 (×2): qty 2

## 2016-04-25 MED ORDER — CALCIUM CARBONATE ANTACID 500 MG PO CHEW
400.0000 mg | CHEWABLE_TABLET | Freq: Three times a day (TID) | ORAL | Status: DC
  Administered 2016-04-25 – 2016-05-01 (×13): 400 mg via ORAL
  Filled 2016-04-25 (×13): qty 2

## 2016-04-25 MED ORDER — FUROSEMIDE 10 MG/ML IJ SOLN
40.0000 mg | Freq: Two times a day (BID) | INTRAMUSCULAR | Status: DC
Start: 2016-04-25 — End: 2016-04-26
  Administered 2016-04-25: 22:00:00 10 mg via INTRAVENOUS
  Filled 2016-04-25 (×2): qty 4

## 2016-04-25 MED ORDER — POTASSIUM CHLORIDE 20 MEQ PO PACK
40.0000 meq | PACK | Freq: Two times a day (BID) | ORAL | Status: DC
  Administered 2016-04-25 – 2016-04-27 (×3): 40 meq via ORAL
  Administered 2016-04-28: 09:00:00 20 meq via ORAL
  Administered 2016-04-29: 10:00:00 40 meq via ORAL
  Filled 2016-04-25 (×8): qty 2

## 2016-04-25 NOTE — Progress Notes (Addendum)
PHARMACY CONSULT NOTE - INITIAL  Pharmacy Consult for Electrolyte management   No Known Allergies  Patient Measurements: Height: 5\' 2"  (157.5 cm) Weight: 162 lb 4.8 oz (73.6 kg) IBW/kg (Calculated) : 50.1  Vital Signs: Temp: 98.9 F (37.2 C) (08/23 1518) Temp Source: Oral (08/23 1518) BP: 140/90 (08/23 1518) Pulse Rate: 94 (08/23 1518) Intake/Output from previous day: No intake/output data recorded. Intake/Output from this shift: No intake/output data recorded.  Labs:   Recent Labs  04/23/16 1028 04/25/16 1841  NA 137 138  K 4.4 3.3*  CL 103 104  CO2 27 27  GLUCOSE 145* 154*  BUN 20 24*  CREATININE 1.34* 1.40*  CALCIUM 7.0* 7.1*  MG 1.2* 1.6*  PROT 4.7*  --   ALBUMIN 1.4*  --   AST 29  --   ALT 25  --   ALKPHOS 147*  --   BILITOT 0.7  --    Estimated Creatinine Clearance: 35.6 mL/min (by C-G formula based on SCr of 1.4 mg/dL).   No results for input(s): GLUCAP in the last 72 hours.  Medical History: Past Medical History:  Diagnosis Date  . Cancer [facility]) 2011   endometrial  . Chronic kidney disease    STONES  . Diverticulitis of colon (without mention of hemorrhage)   . History of kidney stones 2011  . Mechanical complication of colostomy and enterostomy   . Obesity, unspecified   . Personal history of tobacco use, presenting hazards to health    Assessment: 70 yo female admitted with anasarca. Pharmacy has been consulted for electrolyte management.   8/23: K: 3.3     Mg: 1.6  Plan:  Will supplement with Magnesium 2gm IV x1 dose.  Will give KCL 4mEq PO x1. Patient is scheduled to received potassium 20mEq at 2200 as daily regimen.  Renal function pane; and Mg ordered with AM labs.  Pharmacy will continue to monitor and replace per consult.   Tee Richeson M Darnita Woodrum 04/25/2016,7:31 PM

## 2016-04-25 NOTE — H&P (Signed)
[facility] NAME: [name]    MR#:  [id number]  DATE OF BIRTH:  1946/08/09  DATE OF ADMISSION:  04/25/2016  PRIMARY CARE PHYSICIAN: [name], MD   REQUESTING/REFERRING PHYSICIAN: Dr Mike Gip  CHIEF COMPLAINT:  Bilateral leg swelling edema along with facial puffiness  HISTORY OF PRESENT ILLNESS:  [name]  is a 70 y.o. female with a known history of Endometrial carcinoma who is undergoing chemotherapy at the care center was recently admitted for acute renal failure secondary to dehydration and severe electrolyte abnormality due to high colostomy output comes from [place] after she was found to have significant bilateral lower extremity edema with facial puffiness and seems to have developed anasarca. Patient has albumin of 1.2 and is severely hypoalbuminemic secondary to her ongoing current medical issues. She is being admitted for further evaluation management. She continues to have high colostomy output and she takes octreotide subcutaneous shots to the to help decrease with the output. She's been able to keep up with her oral diet. She is deconditioned very weak and isn't able to get around at home.  PAST MEDICAL HISTORY:   Past Medical History:  Diagnosis Date  . [facility]) 2011   endometrial  . Chronic kidney disease    STONES  . Diverticulitis of colon (without mention of hemorrhage)   . History of kidney stones 2011  . Mechanical complication of colostomy and enterostomy   . Obesity, unspecified   . Personal history of tobacco use, presenting hazards to health     PAST SURGICAL HISTOIRY:   Past Surgical History:  Procedure Laterality Date  . ABDOMINAL HYSTERECTOMY  2011  . APPENDECTOMY    . CHOLECYSTECTOMY  05/07/14   Incidental during ventral hernia repair.   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  COLON SURGERY  05/20/1999   Drainage of pelvic abscess, end colostomy  . COLON SURGERY  11/15/2010   Revision of colostomy stoma  . COLONOSCOPY  June 2010,03/31/14   Tubular adenoma of the transverse colon, hyperplastic polyps of the sigmoid.  [facility] COLOSTOMY  2011  . DILATION AND CURETTAGE OF UTERUS    . HERNIA REPAIR  04/06/14   ventral hernia, tetro rectus Ventralex ST mesh, 20 x 33 cm.   [facility] HYSTEROSCOPY  2011  . LITHOTRIPSY  2009  . MOLE REMOVAL  2003  . PORTACATH PLACEMENT N/A 03/01/2016   Procedure: INSERTION PORT-A-CATH;  Surgeon: [name], MD;  Location: [facility] ORS;  Service: General;  Laterality: N/A;  . PYELOLITHOTOMY    . skin cancer removal  02/2014  . TUBAL LIGATION      SOCIAL HISTORY:   Social History  Substance Use Topics  . Smoking status: Current Every Day Smoker    Packs/day: 0.25    Years: 15.00    Types: Cigarettes  . Smokeless tobacco: Never Used  . Alcohol use No    FAMILY HISTORY:   Family History  Problem Relation Age of Onset  . Breast cancer Maternal Grandmother   . Colon cancer Mother   . Cancer Mother     DRUG ALLERGIES:  No Known Allergies  REVIEW OF SYSTEMS:  Review of Systems  Constitutional: Positive for malaise/fatigue. Negative for chills, fever and weight loss.  HENT: Negative for ear discharge, ear pain and nosebleeds.   Eyes: Negative for blurred vision, pain and discharge.  Respiratory: Negative for sputum production, shortness of breath, wheezing and stridor.   Cardiovascular: Positive for leg swelling. Negative for chest pain, palpitations, orthopnea and PND.  Gastrointestinal: Positive for diarrhea. Negative for abdominal pain, nausea and vomiting.  Genitourinary: Negative for frequency and urgency.  Musculoskeletal: Negative for back pain and joint pain.  Neurological: Positive for weakness. Negative for sensory change, speech change and focal weakness.  Psychiatric/Behavioral: Negative for depression and hallucinations.  The patient is not nervous/anxious.      MEDICATIONS AT HOME:   Prior to Admission medications   Medication Sig Start Date End Date Taking? Authorizing Provider  calcium carbonate (TUMS - DOSED IN MG ELEMENTAL CALCIUM) 500 MG chewable tablet Chew 2 tablets (400 mg of elemental calcium total) by mouth 3 (three) times daily with meals. 04/20/16   Fritzi Mandes, MD  feeding supplement (BOOST / RESOURCE BREEZE) LIQD Take 1 Container by mouth 3 (three) times daily between meals. Patient not taking: Reported on 04/25/2016 04/20/16   Fritzi Mandes, MD  LORazepam (ATIVAN) 0.5 MG tablet Take 1 tablet (0.5 mg total) by mouth every 8 (eight) hours as needed for anxiety. 04/20/16   Fritzi Mandes, MD  magnesium oxide (MAG-OX) 400 (241.3 Mg) MG tablet Take 1 tablet (400 mg total) by mouth 2 (two) times daily. 04/20/16   Fritzi Mandes, MD  octreotide (SANDOSTATIN) 100 MCG/ML SOLN injection Inject 1 mL (100 mcg total) into the skin every 8 (eight) hours. 04/11/16   Lequita Asal, MD  ondansetron (ZOFRAN) 8 MG tablet Take 8 mg by mouth 2 (two) times daily as needed.  02/28/16   Historical Provider, MD  potassium chloride (KLOR-CON) 20 MEQ packet Take 40 mEq by mouth 2 (two) times daily. 04/20/16   Fritzi Mandes, MD  promethazine (PHENERGAN) 25 MG tablet Take 1 tablet (25 mg total) by mouth every 8 (eight) hours as needed for nausea or vomiting. 04/11/16   Lequita Asal, MD  sodium bicarbonate 650 MG tablet Take 2 tablets (1,300 mg total) by mouth 2 (two) times daily. 04/20/16   Fritzi Mandes, MD  Syringe, Disposable, 1 ML MISC 1 Units by Does not apply route 3 (three) times daily. 03/23/16   Srikar Sudini, MD      VITAL SIGNS:  Blood pressure 140/90, pulse 94, temperature 98.9 F (37.2 C), temperature source Oral, resp. rate 18, height 5\' 2"  (1.575 m), weight 162 lb 4.8 oz (73.6 kg), SpO2 100 %.  PHYSICAL EXAMINATION:  GENERAL:  70 y.o.-year-old patient lying in the bed with no acute distress.  EYES: Pupils equal, round,  reactive to light and accommodation. No scleral icterus. Extraocular muscles intact.  HEENT: Head atraumatic, normocephalic. Oropharynx and nasopharynx clear.  NECK:  Supple, no jugular venous distention. No thyroid enlargement, no tenderness.  LUNGS: Normal breath sounds bilaterally, no wheezing, rales,rhonchi or crepitation. No use of accessory muscles of respiration.  CARDIOVASCULAR: S1, S2 normal. No murmurs, rubs, or gallops.  ABDOMEN: Soft, nontender, nondistended. Bowel sounds present. No organomegaly or mass.  EXTREMITIES:+++ pedal edema, no cyanosis, or clubbing.  NEUROLOGIC: Cranial nerves II through XII are intact. Muscle strength 5/5 in all extremities. Sensation intact. Gait not checked.  PSYCHIATRIC: The patient is alert and oriented x 3.  SKIN: No obvious rash, lesion, or ulcer. Dry skin  LABORATORY PANEL:   CBC  Recent Labs Lab 04/23/16 1028  WBC 3.4*  HGB 10.2*  HCT 30.8*  PLT 96*   ------------------------------------------------------------------------------------------------------------------  Chemistries   Recent Labs Lab 04/23/16 1028  NA 137  K 4.4  CL 103  CO2 27  GLUCOSE 145*  BUN 20  CREATININE 1.34*  CALCIUM 7.0*  MG 1.2*  AST 29  ALT 25  ALKPHOS 147*  BILITOT 0.7   ------------------------------------------------------------------------------------------------------------------  Cardiac Enzymes No results for input(s): TROPONINI in the last 168 hours. ------------------------------------------------------------------------------------------------------------------  RADIOLOGY:  No results found.  EKG:    IMPRESSION AND PLAN:    Juleen Itkin  is a 70 y.o. female with a known history of Endometrial carcinoma who is undergoing chemotherapy at the care center was recently admitted for acute renal failure secondary to dehydration and severe electrolyte abnormality due to high colostomy output comes from [place] after she was  found to have significant bilateral lower extremity edema with facial puffiness and seems to have developed anasarca.   1. Generalized anasarca secondary to severe hypoalbuminemia multifactorial appears secondary to chronic medical conditions  -Admit to medical floor -Ultrasound bilateral lower extremity to rule out DVT -Nephrology consultation for possible starting patient on IV albumin and Lasix to help with the edema and anasarca -Physical therapy Patient is severely deconditioned. She'll benefit from rehabilitation.  2. High output through colostomy secondary to chemotherapy-induced diarrhea -Continue octreotide shots -GI consult for possible colonoscopy-Dr Allen Norris to be informed (he has seen pt)  3. hypomagnesimia -pharmacy to replete IV/po  4.hypocalcemia -corrects with her low albumin  5.DVT prophylaxis PLT<96K  SCD and TEDS  PT and CSW for d/c planning All the records are reviewed and case discussed with ED provider. Management plans discussed with the patient, family and they are in agreement.  CODE STATUS: full  TOTAL TIME TAKING CARE OF THIS PATIENT: 40 minutes.    Jaylen Claude M.D on 04/25/2016 at 4:53 PM  Between 7am to 6pm - Pager - [phone]  After 6pm go to [email or web address] - password [email or web address] [facility]  [facility]  Office  [phone]  CC: Primary care physician; [name], MD

## 2016-04-25 NOTE — Progress Notes (Signed)
[facility] day:  04/25/16   Chief Complaint: [name] is a 70 y.o. female with recurrent stage III endometrial cancer who is seen for reassessment on day 21 s/p cycle #2 carboplatin and Taxol.  HPI:  The patient was last seen by me in the medical oncology clinic on 04/02/2016.  At that time, she was fatigued.  Stool consistency had improved on octreotide.  She had new lower extremity edema.  She had hypomagnesemia (0.9).  Bilateral lower extremity duplex on 04/02/2016 was negative.  She was scheduled for an echo.  She was seen by Dr. Delight Hoh on 04/05/2016.  She received cycle #2 carboplatin and Taxol.  She received sandostatin LAR 10 mg.  She received 4 gm IV magnesium.  Echo on 04/06/2016 revealed an EF of 60-65%.  She was admitted to [facility] from 04/12/2016 - 04/20/2016 with acute renal failure secondary to high output from her colostomy.  During her hospitalization, she had significant electrolyte abnormalities (hypokalemia, hypophosphatemia, hypomagnesemia).  Admission creatinine was 4.35.  Discharge creatinine was 1.1.   Diarrhea was only controlled with octreotide SQ.  She has significant pancytopenia due to chemotherapy.  ANC nadir was 200 on 04/12/2016 ([name] 1400 on discharge).  She had persistent thrombocytopenia (platelet count 12,000 on discharge).  HIT assay and serotonin release assay were normal.  She received 1 unit of PRBCs on 04/20/2016.   Labs on 04/23/2016 revealed a hematocrit of 30.8, hemoglobin 10.2, MCV 96.6, platelets 96,000, WBC 3400 with an ANC of 1800.  CMP included a creatinine of 1.34, potassium 4.4, bicarb 20, calcium 7.0 (corrected 9.08) and magnesium 1.4.  She received 2 gm of IV magnesium.  Since discharge, the patient has been extremely debilitated.  She is extremely limited because of massive painful swelling in her legs.  She needs assistance with activities of daily living (ADLs).  Ostomy output was  initially paste (08/18- 08/20), but is now liquid with a small amount of matter.  She states that she is eating the same thing she ate in the hospital- soup, popsicles, and Gatoraide.  She states that she is taking 2 sodium bicarbonate tablets a day, 1 magnesium tablet, and a 20 meq potassium chloride tablet broken in half a day.   She notes redness in her skin on the left lower arm and left foot.  She denies any fever.  She is getting home health care services as well as physical therapy (just started).  Her husband is having difficulty caring for her at home.   Past Medical History:  Diagnosis Date  . Cancer [facility]) 2011   endometrial  . Chronic kidney disease    STONES  . Diverticulitis of colon (without mention of hemorrhage)   . History of kidney stones 2011  . Mechanical complication of colostomy and enterostomy   . Obesity, unspecified   . Personal history of tobacco use, presenting hazards to health     Past Surgical History:  Procedure Laterality Date  . ABDOMINAL HYSTERECTOMY  2011  . APPENDECTOMY    . CHOLECYSTECTOMY  05/07/14   Incidental during ventral hernia repair.   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 COLON SURGERY  05/20/1999   Drainage of pelvic abscess, end colostomy  . COLON SURGERY  11/15/2010   Revision of colostomy stoma  . COLONOSCOPY  June 2010,03/31/14   Tubular adenoma of the transverse colon, hyperplastic  polyps of the sigmoid.  Marland Kitchen COLOSTOMY  2011  . DILATION AND CURETTAGE OF UTERUS    . HERNIA REPAIR  04/06/14   ventral hernia, tetro rectus Ventralex ST mesh, 20 x 33 cm.   Marland Kitchen HYSTEROSCOPY  2011  . LITHOTRIPSY  2009  . MOLE REMOVAL  2003  . PORTACATH PLACEMENT N/A 03/01/2016   Procedure: INSERTION PORT-A-CATH;  Surgeon: [name], MD;  Location: [facility] ORS;  Service: General;  Laterality: N/A;  . PYELOLITHOTOMY    . skin cancer removal  02/2014  . TUBAL LIGATION       Family History  Problem Relation Age of Onset  . Breast cancer Maternal Grandmother   . Colon cancer Mother   . Cancer Mother     Social History:  reports that she has been smoking Cigarettes.  She has a 3.75 pack-year smoking history. She has never used smokeless tobacco. She reports that she does not drink alcohol or use drugs.  She smokes 7 cigarettes a day plus vapor cigarettes.  She doesn't want to quit smoking.  She is a retired Marine scientist.  The patient is accompaied by her husband,  [name], today.  Allergies: No Known Allergies  Current Medications: Current Outpatient Prescriptions  Medication Sig Dispense Refill  . calcium carbonate (TUMS - DOSED IN MG ELEMENTAL CALCIUM) 500 MG chewable tablet Chew 2 tablets (400 mg of elemental calcium total) by mouth 3 (three) times daily with meals. 90 tablet 1  . LORazepam (ATIVAN) 0.5 MG tablet Take 1 tablet (0.5 mg total) by mouth every 8 (eight) hours as needed for anxiety. 30 tablet 0  . magnesium oxide (MAG-OX) 400 (241.3 Mg) MG tablet Take 1 tablet (400 mg total) by mouth 2 (two) times daily. 60 tablet 1  . octreotide (SANDOSTATIN) 100 MCG/ML SOLN injection Inject 1 mL (100 mcg total) into the skin every 8 (eight) hours. 13 mL 0  . ondansetron (ZOFRAN) 8 MG tablet Take 8 mg by mouth 2 (two) times daily as needed.     . potassium chloride (KLOR-CON) 20 MEQ packet Take 40 mEq by mouth 2 (two) times daily. 60 packet 1  . promethazine (PHENERGAN) 25 MG tablet Take 1 tablet (25 mg total) by mouth every 8 (eight) hours as needed for nausea or vomiting. 30 tablet 0  . sodium bicarbonate 650 MG tablet Take 2 tablets (1,300 mg total) by mouth 2 (two) times daily. 60 tablet 6  . Syringe, Disposable, 1 ML MISC 1 Units by Does not apply route 3 (three) times daily. 25 each 0  . feeding supplement (BOOST / RESOURCE BREEZE) LIQD Take 1 Container by mouth 3 (three) times daily between meals. (Patient not taking: Reported on 04/25/2016) 90 Container 0   No  current facility-administered medications for this visit.    Facility-Administered Medications Ordered in Other Visits  Medication Dose Route Frequency Provider Last Rate Last Dose  . magnesium sulfate IVPB 2 g 50 mL  2 g Intravenous Once Lequita Asal, MD        Review of Systems:  GENERAL:  Profound fatigue.  Feels incapacitated.  No fevers or sweats.  Weight stable. PERFORMANCE STATUS (ECOG):  3-4 HEENT:  No visual changes, runny nose, sore throat, mouth sores or tenderness. Lungs:  No shortness of breath or cough.  No hemoptysis. Cardiac:  No chest pain, palpitations, orthopnea, or PND. GI:  Ostomy output watery with some matter on octreotide.  No nausea, vomiting, constipation, melena or hematochezia. GU:  No  urgency, frequency, dysuria, or hematuria. Musculoskeletal:  No back pain.  No muscle tenderness. Extremities:  Significant lower extremity swelling with associated pain. Skin:  Patchy skin breakdown on legs.  Left upper extremity and left foot erythema. Neuro:  General weakness.  No headache, numbness or weakness, balance or coordination issues. Endocrine:  No diabetes, thyroid issues, hot flashes or night sweats. Psych:  States that she "doesn't feel a thing".  Her husband thinks she is depressed. Pain:  Pain associated with lower extremity swelling. Review of systems:  All other systems reviewed and found to be negative.  Physical Exam: Blood pressure 98/62, pulse 94, temperature 98.6 F (37 C), temperature source Tympanic, resp. rate 18, weight 169 lb (76.7 kg). GENERAL:  Fatigued appearing woman sitting comfortably in a wheelchair in the exam room in no acute distress. MENTAL STATUS:  Alert and oriented to person, place and time. HEAD:  Short gray hair.  Normocephalic, atraumatic, face symmetric, no Cushingoid features. EYES:  Blue eyes.  Pupils equal round and reactive to light and accomodation.  No conjunctivitis or scleral icterus. ENT:  Oropharynx clear without  lesion.  Tongue normal. Mucous membranes moist.  RESPIRATORY:  Clear to auscultation without rales, wheezes or rhonchi. CARDIOVASCULAR:  Regular rate and rhythm without murmur, rub or gallop. ABDOMEN:  Colostomy.  Soft, non-tender, with active bowel sounds, and no hepatosplenomegaly.  No masses. SKIN:  Patchy areas of scabbing on lower extremities.  Left foot with snmall area of erythema.  Left lower arm with erythema, but no increased warmth or tenderness. EXTREMITIES: Bilateral 3+ lower extremity edema extending to thighs and back (anasarca).  Tender to palpation.  No palpable cords. LYMPH NODES: No palpable cervical, supraclavicular, axillary or inguinal adenopathy  NEUROLOGICAL: Unremarkable. PSYCH:  Appropriate.  No visits with results within 3 Day(s) from this visit. Latest known visit with results is:  Hospital Outpatient Visit on 02/21/2016  Component Date Value Ref Range Status  . aPTT 02/21/2016 34  24 - 36 seconds Final  . Prothrombin Time 02/21/2016 14.1  11.4 - 15.0 seconds Final  . INR 02/21/2016 1.07   Final  . SURGICAL PATHOLOGY 02/21/2016    Final-Edited                   Value:Surgical Pathology THIS IS AN ADDENDUM REPORT CASE: [id number] PATIENT: [facility] Surgical Pathology Report Addendum  Reason for Addendum #1:  Immunohistochemistry results  SPECIMEN SUBMITTED: A. Pelvic mass, right B. Pelvic mass, right  CLINICAL HISTORY: T3N2M0 (stage III c2) endometrial carcinoma, grade 3 in 2011  PRE-OPERATIVE DIAGNOSIS: Probable recurrent endometrial carcinoma involving 4-5 cm right pelvic sidewall mass and high left aortic node  POST-OPERATIVE DIAGNOSIS: Same as pre op     DIAGNOSIS: A. PELVIC MASS, RIGHT; BIOPSY: - HIGH GRADE METASTATIC ADENOCARCINOMA, MORPHOLOGICALLY CONSISTENT WITH PATIENT'S KNOWN HISTORY OF ENDOMETRIAL CARCINOMA.  B. PELVIC MASS, RIGHT; CT-GUIDED BIOPSY: - HIGH GRADE METASTATIC ADENOCARCINOMA, MORPHOLOGICALLY CONSISTENT  WITH PATIENT'S KNOWN HISTORY OF ENDOMETRIAL CARCINOMA.  Comment: The patient has a history of poorly differentiated endometrial adenocarcinoma (FIGO                          3)  diagnosed in June 2011. A limited panel of immunohistochemical stains was performed to sub classify the type of endometrial carcinoma. Stain results: p53: positive (focal strong subset in a background of wild type pattern)  ER: positive (diffuse) p 16: positive (focal strong subset) Napsin: negative Stain controls worked appropriately.  This pattern of immunoreactivity is consistent with high grade endometrial adenocarcinoma, endometrioid type with focal aberrant expression of p53 and p16.  The staining pattern is not consistent with serous carcinoma. Preliminary findings were communicated to Dr. Rogue Bussing on 02/22/2016.  GROSS DESCRIPTION:  A. Labeled: right pelvic mass  Tissue fragment(s): 2  Size: cores of pink-tan tissue, and aggregate 2.0 x 0.1 x 0.1 cm  Description: cores received in saline, touch prep prepared with Diff Quik stain slide, following staining instructed by Dr. Reuel Derby entirely formalin fixed, wrapped in lens paper, marked blue and submit                         ted in a mesh bag  Entirely submitted in one cassette(s).   B. Labeled: right pelvic mass  Tissue fragment(s): 3  Size: cores of pink-tan tissue 0.3-2 cm in length and in diameter 0.1 cm  Description: pink-tan course, wrapped in lens paper, marked blue and submitted in a mesh bag  Entirely submitted in 1 cassette(s).     Final Diagnosis performed by Quay Burow, MD.  Electronically signed 02/24/2016 1:43:43PM   The electronic signature indicates that the named Attending Pathologist has evaluated the specimen  Technical component performed at [facility], [address], [place], [place] [address] Lab: [phone] Dir: Darrick Penna. Evette Doffing, MD  Professional component performed at [facility], [facility], [place], [place], [place] [address] Lab: [phone] Dir: Dellia Nims. Reuel Derby, MD   ADDENDUM: Immunohistochemistry (IHC) Testing for Mismatch Repair (MMR) Proteins:  Results:  MLH1: Intact nuclear expression MSH2: Intact nuclear exp                         ression MSH6: Intact nuclear expression PMS2: Intact nuclear expression  IHC Interpretation: No loss of nuclear expression of MMR proteins: Low probability of MSI-H.  Testing was performed on block: B1  IHC slides were prepared by [facility], [place], [place], and interpreted by Dr. Dicie Beam. All controls stained appropriately.    Addendum #1 performed by Bryan Lemma, MD.  Electronically signed 02/29/2016 5:34:54PM    Technical component performed at [facility], [address] [place], [place] [address] Lab: [phone] Dir: Darrick Penna. Evette Doffing, MD  Professional component performed at [facility], [facility], [place], [place], [place] [address] Lab: [phone] Dir: Dellia Nims. Reuel Derby, MD      Assessment:  [name] is a 70 y.o. female with a history of stage III endometrial carcinoma stats post TAH/BSO in 02/2010.  Pathology revealed grade III endometrial carcinoma.  Tumor was 3 x 3 cm and extended through the thickness of the myometrium.  There were 2 of 6 right pelvic lymph nodes and 2 of 6 periaortic nodes involved with metastatic disease. Peritoneal washings were negative.  Pathologic stage was T3N2M0 (stage III c2).  She received 6 cycles of carboplatin and Taxol from 03/27/2010 until 08/23/2010.  She received brachytherapy (completed 11/2010).  She developed a grade I/II neuropathy.  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 Following re-anastomosis, she developed a leak and underwent diverting colostomy.  She has had 3 incisional hernias.  PET scan on 02/16/2016 revealed a 4.4 x 5.0 cm hypermetabolic soft  tissue mass along the right pelvic sidewall, presumably malignant metastatic lymphadenopathy.  There was also metastatic lymphadenopathy in the left para-aortic nodal station adjacent to the left renal hilum, and in the right  retrocrural nodal station.  There was a metastatic lesion in the right ilium.  There was evidence of persistent partial small bowel obstruction.  CA125 results are as follows: 19 on 01/19/2014, 23.6 on 07/23/2014, 40.5 on 01/26/2015, 102.5 on 02/08/2016, 82.7 on 03/07/2016, and 77.9 on 04/02/2016.  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  MSI testing is pending.   She is s/p 2 cycles of carboplatin and Taxol (03/07/2016 - 04/05/2016).  Cycle #1 was complicated by 2 admissions to [facility] (03/18/2016 and 03/25/2016) with acute renal failure secondary to dehydration from diarrhea.  She was treated with IVF and anti-emetics.  Her diarrhea improved with sandostatin 100 mcg SQ q 8 hours.  Stool was negative for C diff.  Sodium bicarbonate was instituted for a low bicarbonate secondary to her diarrhea.   She was admitted to [facility] from 04/12/2016 - 04/20/2016 with acute renal failure (Cr 4.35) secondary to high output from her colostomy.  She had significant electrolyte abnormalities (hypokalemia, hypophosphatemia, hypomagnesemia).  Diarrhea was only controlled with octreotide SQ.  She has significant pancytopenia due to chemotherapy.  [place] nadir was 200 on 04/12/2016.  She had persistent thrombocytopenia (platelet count 12,000).  HIT assay and serotonin release assay were normal.  She received 2 units of PRBCs on 04/20/2016.  She has chronic renal insufficiency.  CrCl is 39 ml/min.  Symptomatically,  she is profoundly fatigued/debiliated.  She has anasarca likely secondary to poor nutrition (albumen 1.4) and ongoing diarrhea.  She has fragile skin with multiple areas of small breakdown on her lower  extremities and possibly early cellulitis.    Plan: 1.  Admit to hospital.   2.  Consult nephrology to assist with diuresis (possible albumen and Lasix) to mobilize lower extremity edema.  She will likely need to be on a combination of aldactone and lasix as an outpatient secondary to chronic issues with hypokalemia. 3.  Nutrition consult. 4.  Bilateral lower extremity duplex- r/o DVT. 5.  Monitor electrolytes daily secondary to recent hospitalization and requirements for potassium, magnesium, calcium, and phosphorus. 6.  Telemetry off unit secondary to electrolyte issues and prior increased QT. 7.  Consult Dr. Allen Norris for possible colonoscopy on 04/27/2016.  I spoke with him today. 8.  Anticipate discharge to rehab/skilled nursing facility.   Lequita Asal, MD  04/25/2016, 12:27 PM

## 2016-04-25 NOTE — Progress Notes (Signed)
Patient is here today for follow up, She does have a cough. She has red lesions on legs and arms they burn. She has edema in legs and arm.

## 2016-04-26 ENCOUNTER — Telehealth: Payer: Self-pay

## 2016-04-26 ENCOUNTER — Inpatient Hospital Stay: Payer: Medicare Other

## 2016-04-26 LAB — RENAL FUNCTION PANEL
Albumin: 2 g/dL — ABNORMAL LOW (ref 3.5–5.0)
Anion gap: 7 (ref 5–15)
BUN: 24 mg/dL — AB (ref 6–20)
CHLORIDE: 104 mmol/L (ref 101–111)
CO2: 28 mmol/L (ref 22–32)
Calcium: 7.3 mg/dL — ABNORMAL LOW (ref 8.9–10.3)
Creatinine, Ser: 1.49 mg/dL — ABNORMAL HIGH (ref 0.44–1.00)
GFR calc non Af Amer: 35 mL/min — ABNORMAL LOW (ref >60)
GFR, EST AFRICAN AMERICAN: 40 mL/min — AB (ref >60)
Glucose, Bld: 103 mg/dL — ABNORMAL HIGH (ref 65–99)
PHOSPHORUS: 2.2 mg/dL — AB (ref 2.5–4.6)
POTASSIUM: 3.7 mmol/L (ref 3.5–5.1)
SODIUM: 139 mmol/L (ref 135–145)

## 2016-04-26 LAB — MAGNESIUM: Magnesium: 2 mg/dL (ref 1.7–2.4)

## 2016-04-26 MED ORDER — ENSURE ENLIVE PO LIQD
1.0000 | ORAL | Status: DC
  Administered 2016-04-28 – 2016-04-30 (×3): 237 mL via ORAL

## 2016-04-26 MED ORDER — PEG 3350-KCL-NA BICARB-NACL 420 G PO SOLR
2000.0000 mL | Freq: Once | ORAL | Status: AC
  Administered 2016-04-26: 20:00:00 2000 mL via ORAL
  Filled 2016-04-26: qty 4000

## 2016-04-26 MED ORDER — FUROSEMIDE 20 MG PO TABS
10.0000 mg | ORAL_TABLET | Freq: Every day | ORAL | Status: DC
  Administered 2016-04-26 – 2016-04-28 (×2): 10 mg via ORAL
  Filled 2016-04-26 (×2): qty 1

## 2016-04-26 NOTE — Progress Notes (Signed)
[facility] [place] NAME: [name]    MR#:  [id number]  DATE OF BIRTH:  03-Aug-1946  SUBJECTIVE:   Patient Here with weakness and anasarca. She reports her symptoms have improved including lower extremity edema as well as swelling in her upper extremities.  REVIEW OF SYSTEMS:    Review of Systems  Constitutional: Negative.  Negative for chills, fever and malaise/fatigue.  HENT: Negative.  Negative for ear discharge, ear pain, hearing loss, nosebleeds and sore throat.   Eyes: Negative.  Negative for blurred vision and pain.  Respiratory: Positive for cough and shortness of breath. Negative for hemoptysis and wheezing.   Cardiovascular: Positive for leg swelling. Negative for chest pain and palpitations.  Gastrointestinal: Negative.  Negative for abdominal pain, blood in stool, diarrhea, nausea and vomiting.  Genitourinary: Negative.  Negative for dysuria.  Musculoskeletal: Negative.  Negative for back pain.  Skin: Negative.   Neurological: Negative for dizziness, tremors, speech change, focal weakness, seizures and headaches.  Endo/Heme/Allergies: Negative.  Does not bruise/bleed easily.  Psychiatric/Behavioral: Negative.  Negative for depression, hallucinations and suicidal ideas.    Tolerating Diet: yes      DRUG ALLERGIES:  No Known Allergies  VITALS:  Blood pressure (!) 85/47, pulse 97, temperature 98.5 F (36.9 C), temperature source Oral, resp. rate 18, height 5\' 2"  (1.575 m), weight 73.6 kg (162 lb 4.8 oz), SpO2 94 %.  PHYSICAL EXAMINATION:   Physical Exam  Constitutional: She is oriented to person, place, and time and well-developed, well-nourished, and in no distress. No distress.  HENT:  Head: Normocephalic.  Eyes: No scleral icterus.  Neck: Normal range of motion. Neck supple. No JVD present. No tracheal deviation present.  Cardiovascular: Normal rate, regular rhythm and normal heart sounds.  Exam reveals no  gallop and no friction rub.   No murmur heard. Pulmonary/Chest: Effort normal and breath sounds normal. No respiratory distress. She has no wheezes. She has no rales. She exhibits no tenderness.  rhonchii  Abdominal: Soft. Bowel sounds are normal. She exhibits no distension and no mass. There is no tenderness. There is no rebound and no guarding.  Musculoskeletal: Normal range of motion. She exhibits edema.  Neurological: She is alert and oriented to person, place, and time.  Skin: Skin is warm. No rash noted. No erythema.  Psychiatric: Affect and judgment normal.      LABORATORY PANEL:   CBC  Recent Labs Lab 04/23/16 1028  WBC 3.4*  HGB 10.2*  HCT 30.8*  PLT 96*   ------------------------------------------------------------------------------------------------------------------  Chemistries   Recent Labs Lab 04/23/16 1028  04/26/16 0455  NA 137  < > 139  K 4.4  < > 3.7  CL 103  < > 104  CO2 27  < > 28  GLUCOSE 145*  < > 103*  BUN 20  < > 24*  CREATININE 1.34*  < > 1.49*  CALCIUM 7.0*  < > 7.3*  MG 1.2*  < > 2.0  AST 29  --   --   ALT 25  --   --   ALKPHOS 147*  --   --   BILITOT 0.7  --   --   < > = values in this interval not displayed. ------------------------------------------------------------------------------------------------------------------  Cardiac Enzymes No results for input(s): TROPONINI in the last 168 hours. ------------------------------------------------------------------------------------------------------------------  RADIOLOGY:  US Venous Img Lower Bilateral  Result Date: 04/26/2016 CLINICAL DATA:  Bilateral leg edema for 1 week EXAM: BILATERAL LOWER EXTREMITY  VENOUS DUPLEX ULTRASOUND TECHNIQUE: Doppler venous assessment of the bilateral lower extremity deep venous system was performed, including characterization of spectral flow, compressibility, and phasicity. COMPARISON:  None. FINDINGS: There is complete compressibility of the bilateral  common femoral, femoral, and popliteal veins. Doppler analysis demonstrates respiratory phasicity and augmentation of flow with calf compression. No obvious superficial vein or calf vein thrombosis. IMPRESSION: No evidence of lower extremity DVT. Electronically Signed   By: Marybelle Killings M.D.   On: 04/26/2016 10:17     ASSESSMENT AND PLAN:  70 y.o. female with a known history of Endometrial carcinoma who is undergoing chemotherapy comes from [place] after she was found to have significant bilateral lower extremity edema and anasarca.   1. Generalized anasarca secondary to severe hypoalbuminemia from protein calorie malnutrition and ongoing diarrhea . Seems to be responding to IV Lasix however blood pressure is low. Patient would most likely benefit from continuous IV Lasix drip so that her blood pressure does not drop with IV Lasix push. Follow up on renal or conditions. Emotionally Dopplers negative for DVT. Continue albumin infusion.  2. High output through colostomy secondary to chemotherapy-induced diarrhea Continue octreotide shots GI consult for colonoscopy, per Dr. Kem Parkinson note this is scheduled for tomorrow.   3. A I depletion: Pharmacy consult to correct electronics.  4.hypocalcemia Corrects with her low albumin  5.DVT prophylaxis PLT<96K  SCD and TEDS  PT and clinical social worker consult for disposition.      Management plans discussed with the patient and she is in agreement.  CODE STATUS: full  TOTAL TIME TAKING CARE OF THIS PATIENT: 30 minutes.     POSSIBLE D/C 2-3 days, DEPENDING ON CLINICAL CONDITION.   Haleema Vanderheyden M.D on 04/26/2016 at 12:00 PM  Between 7am to 6pm - Pager - [phone] After 6pm go to [email or web address] - password [email or web address] [facility]  Office  [phone]  CC: Primary care physician; [name], MD  Note: This dictation was prepared with Dragon dictation along with smaller phrase technology. Any  transcriptional errors that result from this process are unintentional.

## 2016-04-26 NOTE — Telephone Encounter (Signed)
Husband called inquiring more information about his wife, [name]. She is on Lasix and Dr. Allen Norris scheduled her a colonoscopy tomorrow. Husband was concerned that the bowel prep would interfere with her Lasix. I spoke with Dr. Allen Norris about it and he said that the bowel prep will not interfere with her Lasix medication and that it is still alright to do the colonoscopy tomorrow. I let the patient know that the Colonoscopy is scheduled for 2:45 tomorrow.

## 2016-04-26 NOTE — NC FL2 (Signed)
[place] LEVEL OF CARE SCREENING TOOL     IDENTIFICATION  Patient Name: [name] Birthdate: 1945-09-19 Sex: female Admission Date (Current Location): 04/25/2016  [place] and [place] Number:  Engineering geologist and Address:  [facility], [address] [place], [place] [address]      Provider Number: [id number]  Attending Physician Name and Address:  Bettey Costa, MD  Relative Name and Phone Number:       Current Level of Care: Hospital Recommended Level of Care: [place] Prior Approval Number:    Date Approved/Denied:   PASRR Number:  ([id number])  Discharge Plan: SNF    Current Diagnoses: Patient Active Problem List   Diagnosis Date Noted  . Hypocalcemia 04/26/2016  . Anasarca 04/25/2016  . Malnutrition ([place]) 04/25/2016  . Electrolyte disorder   . Thrombocytopenia ([place])   . Leukopenia   . ARF (acute renal failure) ([place]) 04/12/2016  . Bacteriuria with pyuria 03/28/2016  . Acute respiratory failure with hypoxia ([place]) 03/28/2016  . Atelectasis 03/28/2016  . Metabolic acidosis [id number]  . Diarrhea   . Dehydration   . Acute renal failure (ARF) ([place]) 03/18/2016  . Hypomagnesemia 03/07/2016  . Watery stools 01/23/2016  . Generalized abdominal pain 01/23/2016  . Endometrial cancer ([place]) 02/10/2015  . Paroxysmal supraventricular tachycardia ([place]) 05/24/2014  . History of endometrial cancer 04/09/2014  . Tubular adenomas removed from the transverse colon in June 2010. Hyperplastic polyps identified in the sigmoid colon. 03/02/2014  . History of colon polyps 03/02/2014  . Ventral hernia 09/29/2013  . 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 Complication of external stoma of gastrointestinal tract 02/19/2013    Orientation RESPIRATION BLADDER Height & Weight     Self, Situation, Place, Time  Normal Continent Weight: 162 lb 4.8 oz (73.6 kg) Height:  5\' 2"  (157.5 cm)  BEHAVIORAL SYMPTOMS/MOOD  NEUROLOGICAL BOWEL NUTRITION STATUS   (None)   Continent Diet (NPO time specified)  AMBULATORY STATUS COMMUNICATION OF NEEDS Skin   Extensive Assist Verbally Normal                       Personal Care Assistance Level of Assistance  Bathing, Feeding, Dressing Bathing Assistance: Limited assistance Feeding assistance: Independent Dressing Assistance: Limited assistance     Functional Limitations Info  Hearing, Speech, Sight Sight Info: Adequate Hearing Info: Adequate Speech Info: Adequate (None)    SPECIAL CARE FACTORS FREQUENCY  PT (By licensed PT)     PT Frequency:  (5)              Contractures      Additional Factors Info  Code Status, Allergies Code Status Info:  (Full Code) Allergies Info:  (No Known Allergies)           Current Medications (04/26/2016):  This is the current hospital active medication list Current Facility-Administered Medications  Medication Dose Route Frequency Provider Last Rate Last Dose  . acetaminophen (TYLENOL) tablet 650 mg  650 mg Oral Q6H PRN Fritzi Mandes, MD       Or  . acetaminophen (TYLENOL) suppository 650 mg  650 mg Rectal Q6H PRN Fritzi Mandes, MD      . albumin human 25 % solution 25 g  25 g Intravenous BID Lavonia Dana, MD   25 g at 04/26/16 0847  . calcium carbonate (TUMS - dosed in mg elemental calcium) chewable tablet 400 mg of elemental calcium  400 mg of elemental calcium Oral TID WC  Fritzi Mandes, MD   400 mg of elemental calcium at 04/26/16 1430  . feeding supplement (ENSURE ENLIVE) (ENSURE ENLIVE) liquid 237 mL  1 Bottle Oral Q24H Sarath Kolluru, MD      . furosemide (LASIX) tablet 10 mg  10 mg Oral Daily Lavonia Dana, MD   10 mg at 04/26/16 1431  . LORazepam (ATIVAN) tablet 0.5 mg  0.5 mg Oral Q8H PRN Fritzi Mandes, MD   0.5 mg at 04/26/16 1442  . ondansetron (ZOFRAN) tablet 8 mg  8 mg Oral Q8H PRN Fritzi Mandes, MD      . polyethylene glycol-electrolytes (NuLYTELY/GoLYTELY) solution 2,000 mL  2,000 mL Oral Once [name], MD      . potassium chloride (KLOR-CON) packet 40 mEq  40 mEq Oral BID Fritzi Mandes, MD   40 mEq at 04/26/16 0846  . promethazine (PHENERGAN) tablet 25 mg  25 mg Oral Q8H PRN Fritzi Mandes, MD      . sodium bicarbonate tablet 1,300 mg  1,300 mg Oral BID Fritzi Mandes, MD   1,300 mg at 04/26/16 [id number]   Facility-Administered Medications Ordered in Other Encounters  Medication Dose Route Frequency Provider Last Rate Last Dose  . magnesium sulfate IVPB 2 g 50 mL  2 g Intravenous Once Lequita Asal, MD         Discharge Medications: Please see discharge summary for a list of discharge medications.  Relevant Imaging Results:  Relevant Lab Results:   Additional Information  (SSN [id number])  Lorenso Quarry Inis Borneman, LCSW

## 2016-04-26 NOTE — Progress Notes (Signed)
PHARMACY CONSULT NOTE - INITIAL  Pharmacy Consult for Electrolyte management   No Known Allergies  Patient Measurements: Height: 5\' 2"  (157.5 cm) Weight: 162 lb 4.8 oz (73.6 kg) IBW/kg (Calculated) : 50.1  Vital Signs: Temp: 98.5 F (36.9 C) (08/23 2106) Temp Source: Oral (08/23 2106) BP: 81/52 (08/23 2108) Pulse Rate: 93 (08/23 2106) Intake/Output from previous day: 08/23 0701 - 08/24 0700 In: 580 [P.O.:480; IV Piggyback:100] Out: 1275 [Urine:575; Stool:700] Intake/Output from this shift: No intake/output data recorded.  Labs:   Recent Labs  04/23/16 1028 04/25/16 1841 04/26/16 0455  NA 137 138 139  K 4.4 3.3* 3.7  CL 103 104 104  CO2 27 27 28   GLUCOSE 145* 154* 103*  BUN 20 24* 24*  CREATININE 1.34* 1.40* 1.49*  CALCIUM 7.0* 7.1* 7.3*  MG 1.2* 1.6* 2.0  PHOS  --   --  2.2*  PROT 4.7*  --   --   ALBUMIN 1.4*  --  2.0*  AST 29  --   --   ALT 25  --   --   ALKPHOS 147*  --   --   BILITOT 0.7  --   --    Estimated Creatinine Clearance: 33.5 mL/min (by C-G formula based on SCr of 1.49 mg/dL).   No results for input(s): GLUCAP in the last 72 hours.  Medical History: Past Medical History:  Diagnosis Date  . Cancer [facility]) 2011   endometrial  . Chronic kidney disease    STONES  . Diverticulitis of colon (without mention of hemorrhage)   . History of kidney stones 2011  . Mechanical complication of colostomy and enterostomy   . Obesity, unspecified   . Personal history of tobacco use, presenting hazards to health    Assessment: 70 yo female admitted with anasarca. Pharmacy has been consulted for electrolyte management.   8/24: K: 3.7     Mg: 2  Plan:  Patient has been ordered KCL 40 MEQ po BID and furosemide 40 IV BID. She has received 60 MEQ of KCl since yesterday evening, but only 10mg  of lasix bc pt refused to take 40mg  without seeing the dr first. Will recheck electrolytes in the AM.  Lenna Sciara D Mardie Kellen 04/26/2016,7:47 AM

## 2016-04-26 NOTE — Progress Notes (Addendum)
Discontinue ocetreotide, npo except meds per Dr Benjie Karvonen

## 2016-04-26 NOTE — Care Management Important Message (Signed)
Important Message  Patient Details  Name: [name] MRN: [id number] Date of Birth: February 02, 1946   Medicare Important Message Given:  Yes    [name], RN 04/26/2016, 8:25 AM

## 2016-04-26 NOTE — Progress Notes (Signed)
Subjective:   [name] admitted on 04/25/2016 for anasarca electrolyte imbalance Diarrhea   She was started on IV furosemide and IV albumin with significant improvement in peripheral edema.   Husband at bedside.   Refusing IV furosemide currently  Objective:  Vital signs in last 24 hours:  Temp:  [98.5 F (36.9 C)-98.9 F (37.2 C)] 98.5 F (36.9 C) (08/23 2106) Pulse Rate:  [93-97] 96 (08/24 1330) Resp:  [18] 18 (08/24 0834) BP: (80-140)/(45-90) 85/47 (08/24 0834) SpO2:  [94 %-100 %] 95 % (08/24 1330) Weight:  [73.6 kg (162 lb 4.8 oz)] 73.6 kg (162 lb 4.8 oz) (08/23 1518)  Weight change:  Filed Weights   04/25/16 1518  Weight: 73.6 kg (162 lb 4.8 oz)    Intake/Output:    Intake/Output Summary (Last 24 hours) at 04/26/16 1402 Last data filed at 04/26/16 1300  Gross per 24 hour  Intake             1060 ml  Output             1400 ml  Net             -340 ml     Physical Exam: General: No acute distress, laying in the bed   HEENT Moist oral mucous membranes   Neck Supple   Pulm/lungs Clear   CVS/Heart S1S2 no rubs  Abdomen:  Soft, nontender, nondistended, colostomy in place   Extremities: 1+ peripheral edema   Neurologic: Alert, oriented   Skin: Scaly lesions bilateral legs           Basic Metabolic Panel:   Recent Labs Lab 04/20/16 0534 04/23/16 1028 04/25/16 1841 04/26/16 0455  NA 140 137 138 139  K 3.8 4.4 3.3* 3.7  CL 105 103 104 104  CO2 30 27 27 28   GLUCOSE 117* 145* 154* 103*  BUN 26* 20 24* 24*  CREATININE 1.11* 1.34* 1.40* 1.49*  CALCIUM 6.3* 7.0* 7.1* 7.3*  MG 1.7 1.2* 1.6* 2.0  PHOS 3.0  --   --  2.2*     CBC:  Recent Labs Lab 04/20/16 0534 04/20/16 1413 04/23/16 1028  WBC 2.3*  --  3.4*  NEUTROABS 1.4  --  1.8  HGB 7.0* 8.6* 10.2*  HCT 21.0* 25.7* 30.8*  MCV 94.7  --  96.6  PLT 12*  --  96*      Microbiology:  Recent Results (from the past 720 hour(s))  Urine culture     Status: None   Collection  Time: 03/28/16  8:52 AM  Result Value Ref Range Status   Specimen Description URINE, CATHETERIZED  Final   Special Requests NONE  Final   Culture NO GROWTH Performed at [facility]   Final   Report Status 03/29/2016 FINAL  Final    Coagulation Studies: No results for input(s): LABPROT, INR in the last 72 hours.  Urinalysis: No results for input(s): COLORURINE, LABSPEC, PHURINE, GLUCOSEU, HGBUR, BILIRUBINUR, KETONESUR, PROTEINUR, UROBILINOGEN, NITRITE, LEUKOCYTESUR in the last 72 hours.  Invalid input(s): APPERANCEUR    Imaging: US Venous Img Lower Bilateral  Result Date: 04/26/2016 CLINICAL DATA:  Bilateral leg edema for 1 week EXAM: BILATERAL LOWER EXTREMITY VENOUS DUPLEX ULTRASOUND TECHNIQUE: Doppler venous assessment of the bilateral lower extremity deep venous system was performed, including characterization of spectral flow, compressibility, and phasicity. COMPARISON:  None. FINDINGS: There is complete compressibility of the bilateral common femoral, femoral, and popliteal veins. Doppler analysis demonstrates respiratory phasicity and augmentation of flow  with calf compression. No obvious superficial vein or calf vein thrombosis. IMPRESSION: No evidence of lower extremity DVT. Electronically Signed   By: Marybelle Killings M.D.   On: 04/26/2016 10:17     Medications:     . albumin human  25 g Intravenous BID  . calcium carbonate  400 mg of elemental calcium Oral TID WC  . feeding supplement (ENSURE ENLIVE)  1 Bottle Oral Q24H  . furosemide  10 mg Oral Daily  . octreotide  100 mcg Subcutaneous Q8H  . polyethylene glycol-electrolytes  2,000 mL Oral Once  . potassium chloride  40 mEq Oral BID  . sodium bicarbonate  1,300 mg Oral BID   acetaminophen **OR** acetaminophen, LORazepam, ondansetron, promethazine  Assessment/ Plan:  70 y.o.white female with history of Endometrial cancer history of diverticulitis status post colostomy,   1. Hypoalbuminemia with anasarca:  protein losses from high output colostomy - IV albumin - Switch to PO furosemide as patient refuses the IV furosemide.   2. Acute renal failure on chronic kidney disease stage III: creatinine fluctuates with fluid status. Seems baseline is 1.1-1.2. Acute renal failure from prerenal azotemia and poor renal perfusion   3. Hypocalcemia: corrected calcium of 8.9.    LOS: [place], Faithlynn Deeley 8/24/20172:02 PM

## 2016-04-26 NOTE — Progress Notes (Signed)
Pt refused to take the entire 40mg  of Lasix until she talked to a doctor.  She did request to take 10mg .

## 2016-04-26 NOTE — Clinical Social Work Note (Signed)
Clinical Social Work Assessment  Patient Details  Name: [name] MRN: [id number] Date of Birth: 1946/03/14  Date of referral:  04/26/16               Reason for consult:  Discharge Planning                Permission sought to share information with:  Family Supports Permission granted to share information::  Yes, Verbal Permission Granted  Name::        Agency::     Relationship::   [name]- Spouse)  Contact Information:     Housing/Transportation Living arrangements for the past 2 months:  Single Family Home Source of Information:  Patient Patient Interpreter Needed:  None Criminal Activity/Legal Involvement Pertinent to Current Situation/Hospitalization:  No - Comment as needed Significant Relationships:  Spouse Lives with:  Spouse Do you feel safe going back to the place where you live?  Yes Need for family participation in patient care:  No (Coment)  Care giving concerns:  PT recommended SNF placement at discharge.   Social Worker assessment / plan:  CSW and  CSW Intern met with patient at discharge. Introduced herself and her role. CSW informed patient that PT recommended SNF placement. Per patient she's agreeable to go to SNF at discharge. She reports she lives with her husband. She reports that she is unfamiliar with SNFs in [facility]. CSW provided a SNF list. Granted CSW verbal permission to send SNF referral to SNFs in [facility]. CSW encouraged patient to research facilities listed to obtain preference. FL2/ PASRR completed, placed in MD's basket for cosign. SNF referral sent to SNFs in [facility]. Awaiting bed offers.   Employment status:  Retired Forensic scientist:  Medicare PT Recommendations:  Not assessed at this time [place] / Referral to community resources:  [place]  Patient/Family's Response to care:  Patient agreeable to go to SNF at discharge.   Patient/Family's Understanding of and Emotional Response to  Diagnosis, Current Treatment, and Prognosis:  Patient reports she understands her  Diagnosis, Current Treatment, and Prognosis. Thanked CSW for her assistance.   Emotional Assessment Appearance:  Appears stated age Attitude/Demeanor/Rapport:   (None) Affect (typically observed):  Calm, Pleasant Orientation:  Oriented to Self, Oriented to Place, Oriented to  Time, Oriented to Situation Alcohol / Substance use:  Not Applicable Psych involvement (Current and /or in the community):  No (Comment)  Discharge Needs  Concerns to be addressed:  Discharge Planning Concerns Readmission within the last 30 days:  No Current discharge risk:  Chronically ill Barriers to Discharge:  Continued Medical Work up   [employer], LCSW 04/26/2016, 3:48 PM

## 2016-04-26 NOTE — Consult Note (Signed)
I have been asked to see this patient who is known to me from her previous admission.  The patient  Has metastatic cancer and has been having profuse diarrhea.  The patient's diarrhea has continued despite her octreotide although she states it is better.  The patient has now been admitted and I have been consulted to perform a colonoscopy on the patient to look for the cause of her diarrhea. The patient has been explained the plan and will be set up for colonoscopy for tomorrow.

## 2016-04-26 NOTE — Evaluation (Signed)
Physical Therapy Evaluation Patient Details Name: [name] MRN: [id number] DOB: 11/03/1945 Today's Date: 04/26/2016   History of Present Illness  Pt is a 70 y.o. female with a known history of endometrial carcinoma who is undergoing chemotherapy at the care center was recently admitted for acute renal failure secondary to dehydration and severe electrolyte abnormality due to high colostomy output comes from cancer center after she was found to have significant bilateral lower extremity edema with facial puffiness and seems to have developed anasarca. Patient has albumin of 1.2 and is severely hypoalbuminemic secondary to her ongoing current medical issues.  Clinical Impression  Pt presents with deficits in strength, mobility, gait, transfers, balance, and activity tolerance. Pt required min A for bed mobility and min-mod A for transfers.  Poor activity tolerance with pt able to amb 20' with CGA and RW.  Pt will benefit from PT services to address these deficits for decreased caregiver assistance and decreased fall risk.     Follow Up Recommendations SNF    Equipment Recommendations       Recommendations for Other Services       Precautions / Restrictions Precautions Precautions: Fall Precaution Comments: LLQ colostomy Restrictions Weight Bearing Restrictions: No      Mobility  Bed Mobility Overal bed mobility: Needs Assistance Bed Mobility: Supine to Sit;Sit to Supine     Supine to sit: Min assist Sit to supine: Min assist      Transfers Overall transfer level: Needs assistance Equipment used: Rolling walker (2 wheeled) Transfers: Sit to/from Stand Sit to Stand: Mod assist         General transfer comment:  (Sit-to-stand with Min A from EOB, Mod A from standard height toilet)  Ambulation/Gait Ambulation/Gait assistance: Min guard Ambulation Distance (Feet): 20 Feet Assistive device: Rolling walker (2 wheeled) Gait Pattern/deviations: Step-through  pattern   Gait velocity interpretation: Below normal speed for age/gender General Gait Details: Impulsive with lateral instability  Stairs Stairs:  (Deferred secondary to weakness)          Wheelchair Mobility    Modified Rankin (Stroke Patients Only)       Balance Overall balance assessment: Needs assistance Sitting-balance support: No upper extremity supported       Standing balance support: Bilateral upper extremity supported Standing balance-Leahy Scale: Fair                               Pertinent Vitals/Pain Pain Assessment: No/denies pain    Home Living Family/patient expects to be discharged to:: Skilled nursing facility Living Arrangements: Spouse/significant other Available Help at Discharge: Family Type of Home: House Home Access: Stairs to enter Entrance Stairs-Rails: Left;Right (Rails too wide for both) Technical brewer of Steps: 5 Home Layout: One level        Prior Function Level of Independence: Independent         Comments: Indep for ADLs, household mobility without assist device.  Denies fall history.     Hand Dominance        Extremity/Trunk Assessment   Upper Extremity Assessment: Generalized weakness           Lower Extremity Assessment: Generalized weakness         Communication   Communication: No difficulties  Cognition Arousal/Alertness: Awake/alert Behavior During Therapy: WFL for tasks assessed/performed Overall Cognitive Status: Within Functional Limits for tasks assessed  General Comments      Exercises Total Joint Exercises Ankle Circles/Pumps: AROM;Both;10 reps Quad Sets: AROM;Both;10 reps Gluteal Sets: AROM;Both;10 reps Heel Slides: AROM;Both;10 reps Long Arc Quad: AROM;Both;10 reps Knee Flexion: AROM;Both;10 reps Bridges: AROM;Both;5 reps (Unable to fully clear bed surface) Other Exercises Other Exercises: HEP edcuation/review for BLE APs, GS, and  QS x 10-15 reps 8-10x/day      Assessment/Plan    PT Assessment Patient needs continued PT services  PT Diagnosis Difficulty walking;Generalized weakness   PT Problem List Decreased strength;Decreased activity tolerance;Decreased balance;Decreased mobility  PT Treatment Interventions DME instruction;Gait training;Stair training;Functional mobility training;Therapeutic activities;Therapeutic exercise;Balance training;Neuromuscular re-education;Patient/family education   PT Goals (Current goals can be found in the Care Plan section) Acute Rehab PT Goals Patient Stated Goal: To walk better PT Goal Formulation: With patient Time For Goal Achievement: 05/09/16    Frequency Min 2X/week   Barriers to discharge        Co-evaluation               End of Session Equipment Utilized During Treatment: Gait belt Activity Tolerance: Patient limited by fatigue Patient left: in bed;with call bell/phone within reach;with bed alarm set           Time: [id number] PT Time Calculation (min) (ACUTE ONLY): 44 min   Charges:   PT Evaluation $PT Eval Low Complexity: 1 Procedure PT Treatments $Therapeutic Exercise: 8-22 mins   PT G Codes:        DRoyetta Asal PT, DPT 04/26/16, 1:55 PM

## 2016-04-27 ENCOUNTER — Encounter: Payer: Self-pay | Admitting: *Deleted

## 2016-04-27 ENCOUNTER — Encounter
Admission: AD | Disposition: A | Discharge status: Home / Self Care | Payer: Self-pay | Source: Ambulatory Visit | Attending: Internal Medicine

## 2016-04-27 ENCOUNTER — Inpatient Hospital Stay: Payer: Medicare Other | Admitting: Certified Registered Nurse Anesthetist

## 2016-04-27 DIAGNOSIS — K529 Noninfective gastroenteritis and colitis, unspecified: Secondary | ICD-10-CM

## 2016-04-27 DIAGNOSIS — D123 Benign neoplasm of transverse colon: Secondary | ICD-10-CM

## 2016-04-27 HISTORY — PX: COLONOSCOPY WITH PROPOFOL: SHX5780

## 2016-04-27 LAB — MAGNESIUM: MAGNESIUM: 1.7 mg/dL (ref 1.7–2.4)

## 2016-04-27 LAB — POTASSIUM: POTASSIUM: 3.1 mmol/L — AB (ref 3.5–5.1)

## 2016-04-27 LAB — PHOSPHORUS: PHOSPHORUS: 2.2 mg/dL — AB (ref 2.5–4.6)

## 2016-04-27 SURGERY — COLONOSCOPY WITH PROPOFOL
Anesthesia: General

## 2016-04-27 MED ORDER — PHENYLEPHRINE HCL 10 MG/ML IJ SOLN
INTRAMUSCULAR | Status: DC | PRN
  Administered 2016-04-27 (×2): 100 ug via INTRAVENOUS

## 2016-04-27 MED ORDER — OCTREOTIDE ACETATE 100 MCG/ML IJ SOLN
100.0000 ug | Freq: Three times a day (TID) | INTRAMUSCULAR | Status: DC
  Administered 2016-04-27 – 2016-05-01 (×12): 100 ug via SUBCUTANEOUS
  Filled 2016-04-27 (×13): qty 1

## 2016-04-27 MED ORDER — POTASSIUM CHLORIDE 20 MEQ PO PACK: 40.0000 meq | PACK | Freq: Once | ORAL | Status: DC

## 2016-04-27 MED ORDER — POTASSIUM PHOSPHATES 15 MMOLE/5ML IV SOLN
30.0000 mmol | Freq: Once | INTRAVENOUS | Status: AC
  Administered 2016-04-27: 30 mmol via INTRAVENOUS
  Filled 2016-04-27: qty 10

## 2016-04-27 MED ORDER — PROPOFOL 10 MG/ML IV BOLUS
INTRAVENOUS | Status: DC | PRN
  Administered 2016-04-27: 10 mg via INTRAVENOUS
  Administered 2016-04-27: 20 mg via INTRAVENOUS
  Administered 2016-04-27: 10 mg via INTRAVENOUS

## 2016-04-27 MED ORDER — PROPOFOL 500 MG/50ML IV EMUL
INTRAVENOUS | Status: DC | PRN
  Administered 2016-04-27: 60 ug/kg/min via INTRAVENOUS

## 2016-04-27 MED ORDER — LORAZEPAM 2 MG/ML IJ SOLN
0.5000 mg | Freq: Four times a day (QID) | INTRAMUSCULAR | Status: DC | PRN
  Administered 2016-04-27: 13:00:00 0.5 mg via INTRAVENOUS
  Filled 2016-04-27: qty 1

## 2016-04-27 MED ORDER — MIDAZOLAM HCL 2 MG/2ML IJ SOLN
INTRAMUSCULAR | Status: DC | PRN
  Administered 2016-04-27: 1 mg via INTRAVENOUS

## 2016-04-27 MED ORDER — MAGNESIUM SULFATE 4 GM/100ML IV SOLN
4.0000 g | Freq: Once | INTRAVENOUS | Status: AC
  Administered 2016-04-27: 09:00:00 4 g via INTRAVENOUS
  Filled 2016-04-27: qty 100

## 2016-04-27 MED ORDER — SODIUM CHLORIDE 0.9 % IV SOLN
INTRAVENOUS | Status: DC
  Administered 2016-04-27: 13:00:00 via INTRAVENOUS

## 2016-04-27 NOTE — Progress Notes (Signed)
10:00am Albumin dose unavailable to give, not ready from pharmacy. Adrienne in Pharmacy notified at 11:15am dose not on floor, will send up as soon as possible.

## 2016-04-27 NOTE — Progress Notes (Signed)
Subjective:   Colonoscopy for later today.  Edema improved.   UOP 1500 Ostomy 2400  Objective:  Vital signs in last 24 hours:  Temp:  [98.1 F (36.7 C)-98.2 F (36.8 C)] 98.2 F (36.8 C) (08/25 0518) Pulse Rate:  [84-96] 84 (08/25 0858) Resp:  [16-20] 18 (08/25 0858) BP: (83-137)/(43-114) 95/56 (08/25 0858) SpO2:  [94 %-97 %] 97 % (08/25 0858)  Weight change:  Filed Weights   04/25/16 1518  Weight: 73.6 kg (162 lb 4.8 oz)    Intake/Output:    Intake/Output Summary (Last 24 hours) at 04/27/16 1142 Last data filed at 04/27/16 0851  Gross per 24 hour  Intake              600 ml  Output             3575 ml  Net            -2975 ml     Physical Exam: General: No acute distress, laying in the bed   HEENT Moist oral mucous membranes   Neck Supple   Pulm/lungs Clear   CVS/Heart S1S2 no rubs  Abdomen:  Soft, nontender, nondistended, colostomy in place   Extremities: 1+ peripheral edema   Neurologic: Alert, oriented   Skin: Scaly lesions bilateral legs           Basic Metabolic Panel:   Recent Labs Lab 04/23/16 1028 04/25/16 1841 04/26/16 0455 04/27/16 0441  NA 137 138 139  --   K 4.4 3.3* 3.7 3.1*  CL 103 104 104  --   CO2 27 27 28   --   GLUCOSE 145* 154* 103*  --   BUN 20 24* 24*  --   CREATININE 1.34* 1.40* 1.49*  --   CALCIUM 7.0* 7.1* 7.3*  --   MG 1.2* 1.6* 2.0 1.7  PHOS  --   --  2.2* 2.2*     CBC:  Recent Labs Lab 04/20/16 1413 04/23/16 1028  WBC  --  3.4*  NEUTROABS  --  1.8  HGB 8.6* 10.2*  HCT 25.7* 30.8*  MCV  --  96.6  PLT  --  96*      Microbiology:  No results found for this or any previous visit (from the past 720 hour(s)).  Coagulation Studies: No results for input(s): LABPROT, INR in the last 72 hours.  Urinalysis: No results for input(s): COLORURINE, LABSPEC, PHURINE, GLUCOSEU, HGBUR, BILIRUBINUR, KETONESUR, PROTEINUR, UROBILINOGEN, NITRITE, LEUKOCYTESUR in the last 72 hours.  Invalid input(s): APPERANCEUR     Imaging: US Venous Img Lower Bilateral  Result Date: 04/26/2016 CLINICAL DATA:  Bilateral leg edema for 1 week EXAM: BILATERAL LOWER EXTREMITY VENOUS DUPLEX ULTRASOUND TECHNIQUE: Doppler venous assessment of the bilateral lower extremity deep venous system was performed, including characterization of spectral flow, compressibility, and phasicity. COMPARISON:  None. FINDINGS: There is complete compressibility of the bilateral common femoral, femoral, and popliteal veins. Doppler analysis demonstrates respiratory phasicity and augmentation of flow with calf compression. No obvious superficial vein or calf vein thrombosis. IMPRESSION: No evidence of lower extremity DVT. Electronically Signed   By: Marybelle Killings M.D.   On: 04/26/2016 10:17     Medications:     . albumin human  25 g Intravenous BID  . calcium carbonate  400 mg of elemental calcium Oral TID WC  . feeding supplement (ENSURE ENLIVE)  1 Bottle Oral Q24H  . furosemide  10 mg Oral Daily  . potassium chloride  40 mEq Oral BID  .  potassium phosphate IVPB (mmol)  30 mmol Intravenous Once  . sodium bicarbonate  1,300 mg Oral BID   acetaminophen **OR** acetaminophen, LORazepam, ondansetron, promethazine  Assessment/ Plan:  70 y.o.white female with history of Endometrial cancer history of diverticulitis status post colostomy,   1. Hypoalbuminemia with anasarca: protein losses from high output colostomy - IV albumin - PO furosemide   2. Acute renal failure on chronic kidney disease stage III: creatinine fluctuates with fluid status. Seems baseline is 1.1-1.2. Acute renal failure from prerenal azotemia and poor renal perfusion   3. Hypocalcemia: corrected calcium of 8.9.  - continue calcium carbonate  4. Hypokalemia: on PO and IV replacement - discontinue sodium bicarbonate.    LOS: 2 Rayland Hamed 8/25/201711:42 AM

## 2016-04-27 NOTE — Progress Notes (Signed)
[facility] NAME: Jaxx Aud    MR#:  [id number]  DATE OF BIRTH:  [birth year]  SUBJECTIVE:  CHIEF COMPLAINT:  No chief complaint on file.  - Much improved edema. An albumin daily. -For colonoscopy today planned for chronic diarrhea.  REVIEW OF SYSTEMS:  Review of Systems  Constitutional: Positive for malaise/fatigue. Negative for chills and fever.  HENT: Negative for ear discharge, ear pain and nosebleeds.   Eyes: Negative for blurred vision and double vision.  Respiratory: Negative for cough, shortness of breath and wheezing.   Cardiovascular: Positive for leg swelling. Negative for chest pain and palpitations.  Gastrointestinal: Positive for diarrhea. Negative for abdominal pain, constipation, nausea and vomiting.  Genitourinary: Negative for dysuria.  Musculoskeletal: Positive for myalgias.  Neurological: Negative for dizziness, sensory change, speech change, focal weakness, seizures and headaches.  Psychiatric/Behavioral: Negative for depression.    DRUG ALLERGIES:  No Known Allergies  VITALS:  Blood pressure (!) 94/54, pulse 77, temperature 97.9 F (36.6 C), temperature source Oral, resp. rate 18, height 5\' 2"  (1.575 m), weight 73.6 kg (162 lb 4.8 oz), SpO2 93 %.  PHYSICAL EXAMINATION:  Physical Exam  GENERAL:  70 y.o.-year-old patient lying in the bed with no acute distress.  EYES: Pupils equal, round, reactive to light and accommodation. No scleral icterus. Extraocular muscles intact.  HEENT: Head atraumatic, normocephalic. Oropharynx and nasopharynx clear.  NECK:  Supple, no jugular venous distention. No thyroid enlargement, no tenderness.  LUNGS: Normal breath sounds bilaterally, no wheezing, rales,rhonchi or crepitation. No use of accessory muscles of respiration. Decreased bibasilar breath sounds. CARDIOVASCULAR: S1, S2 normal. No murmurs, rubs, or gallops.  ABDOMEN: Soft, nontender, nondistended. Bowel  sounds present. No organomegaly or mass. Colostomy bag in place EXTREMITIES: No cyanosis, or clubbing. 1+ pedal edema NEUROLOGIC: Cranial nerves II through XII are intact. Muscle strength 5/5 in all extremities. Sensation intact. Gait not checked.  PSYCHIATRIC: The patient is alert and oriented x 3.  SKIN: No obvious rash, lesion, or ulcer.    LABORATORY PANEL:   CBC  Recent Labs Lab 04/23/16 1028  WBC 3.4*  HGB 10.2*  HCT 30.8*  PLT 96*   ------------------------------------------------------------------------------------------------------------------  Chemistries   Recent Labs Lab 04/23/16 1028  04/26/16 0455 04/27/16 0441  NA 137  < > 139  --   K 4.4  < > 3.7 3.1*  CL 103  < > 104  --   CO2 27  < > 28  --   GLUCOSE 145*  < > 103*  --   BUN 20  < > 24*  --   CREATININE 1.34*  < > 1.49*  --   CALCIUM 7.0*  < > 7.3*  --   MG 1.2*  < > 2.0 1.7  AST 29  --   --   --   ALT 25  --   --   --   ALKPHOS 147*  --   --   --   BILITOT 0.7  --   --   --   < > = values in this interval not displayed. ------------------------------------------------------------------------------------------------------------------  Cardiac Enzymes No results for input(s): TROPONINI in the last 168 hours. ------------------------------------------------------------------------------------------------------------------  RADIOLOGY:  US Venous Img Lower Bilateral  Result Date: 04/26/2016 CLINICAL DATA:  Bilateral leg edema for 1 week EXAM: BILATERAL LOWER EXTREMITY VENOUS DUPLEX ULTRASOUND TECHNIQUE: Doppler venous assessment of the bilateral lower extremity deep venous system was performed, including characterization of spectral flow,  compressibility, and phasicity. COMPARISON:  None. FINDINGS: There is complete compressibility of the bilateral common femoral, femoral, and popliteal veins. Doppler analysis demonstrates respiratory phasicity and augmentation of flow with calf compression. No obvious  superficial vein or calf vein thrombosis. IMPRESSION: No evidence of lower extremity DVT. Electronically Signed   By: Marybelle Killings M.D.   On: 04/26/2016 10:17    EKG:   Orders placed or performed during the hospital encounter of 04/12/16  . EKG 12-Lead  . EKG 12-Lead  . EKG 12-Lead  . EKG 12-Lead  . EKG    ASSESSMENT AND PLAN:   70 year old female with past medical history significant for endometrial carcinoma currently receiving chemotherapy, chronic kidney disease, diverticulitis, colostomy with chronic diarrhea presents to hospital secondary to worsening lower extremity edema noted to be severely hypoalbumineic  #1 anasarca-secondary to hypoalbuminemia -Continue IV albumin daily. -Appreciate nephrology consult. Lasix changed to oral dose. -Severe malnourishment and increased GI output from colostomy bag.  #2 chronic diarrhea-with high output through colostomy. Secondary to chemotherapy. -Appreciate GI consult. Colonoscopy with polyp that was sent for biopsy. 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 Follows at the cancer center. Management per oncology.  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 Follow-up  #6 DVT prophylaxis-Ted's and SCDs   All the records are reviewed and case discussed with Care Management/Social Workerr. Management plans discussed with the patient, family and they are in agreement.  CODE STATUS: Full code  TOTAL TIME TAKING CARE OF THIS PATIENT: 37 minutes.   POSSIBLE D/C IN 1-2 DAYS, DEPENDING ON CLINICAL CONDITION.   Lindwood Mogel M.D on 04/27/2016 at 3:35 PM  Between 7am to 6pm - Pager - [phone]  After 6pm go to [email or web address] - password [email or web address] [facility]  Office  [phone]  CC: Primary care physician; [name], MD

## 2016-04-27 NOTE — Anesthesia Procedure Notes (Signed)
Date/Time: 04/27/2016 1:24 PM Performed by: Johnna Acosta Pre-anesthesia Checklist: Patient identified, Emergency Drugs available, Suction available, Patient being monitored and Timeout performed Patient Re-evaluated:Patient Re-evaluated prior to inductionOxygen Delivery Method: Nasal cannula

## 2016-04-27 NOTE — Anesthesia Procedure Notes (Signed)
Performed by: Glennis Borger       

## 2016-04-27 NOTE — Anesthesia Preprocedure Evaluation (Signed)
Anesthesia Evaluation  Patient identified by MRN, date of birth, ID band Patient awake    Reviewed: Allergy & Precautions, H&P , NPO status , Patient's Chart, lab work & pertinent test results, reviewed documented beta blocker date and time   History of Anesthesia Complications Negative for: history of anesthetic complications  Airway Mallampati: III  TM Distance: >3 FB Neck ROM: full    Dental no notable dental hx. (+) Poor Dentition, Chipped, Missing   Pulmonary neg shortness of breath, neg sleep apnea, neg COPD, neg recent URI, Current Smoker,    Pulmonary exam normal breath sounds clear to auscultation       Cardiovascular Exercise Tolerance: Good negative cardio ROS Normal cardiovascular exam Rhythm:regular Rate:Normal     Neuro/Psych negative neurological ROS  negative psych ROS   GI/Hepatic negative GI ROS, Neg liver ROS,   Endo/Other  negative endocrine ROS  Renal/GU CRFRenal disease  negative genitourinary   Musculoskeletal   Abdominal   Peds  Hematology negative hematology ROS (+)   Anesthesia Other Findings Past Medical History: 2011: Cancer ([place])     Comment: endometrial No date: Chronic kidney disease     Comment: STONES No date: Diverticulitis of colon (without mention of he* 2011: History of kidney stones No date: Mechanical complication of colostomy and enter* No date: Obesity, unspecified No date: Personal history of tobacco use, presenting ha*   Reproductive/Obstetrics negative OB ROS                             Anesthesia Physical Anesthesia Plan  ASA: II  Anesthesia Plan: General   Post-op Pain Management:    Induction:   Airway Management Planned:   Additional Equipment:   Intra-op Plan:   Post-operative Plan:   Informed Consent: I have reviewed the patients History and Physical, chart, labs and discussed the procedure including the risks,  benefits and alternatives for the proposed anesthesia with the patient or authorized representative who has indicated his/her understanding and acceptance.   Dental Advisory Given  Plan Discussed with: Anesthesiologist, CRNA and Surgeon  Anesthesia Plan Comments:         Anesthesia Quick Evaluation

## 2016-04-27 NOTE — Progress Notes (Signed)
Physical Therapy Treatment Patient Details Name: [name] MRN: [id number] DOB: Feb 27, 1946 Today's Date: 04/27/2016    History of Present Illness is a 70 y.o. female with a known history of Endometrial carcinoma who is undergoing chemotherapy at the care center was recently admitted for acute renal failure secondary to dehydration and severe electrolyte abnormality due to high colostomy output comes from [place] after she was found to have significant bilateral lower extremity edema with facial puffiness and seems to have developed anasarca.    PT Comments    Pt initially very terse and eager to finish PT, but does warm up and is willing to listen to cuing and d/c planning/considerations.  Pt is able to ambulate much farther and with increased confidence today but is very fatigued with the effort, she is anxious about attempting stairs.  Pt shows good effort with LE exercises, but does have considerable weakness with b/l hip flexion (weaker in R hip).  Pt very anxious about her colonoscopy and her condition in general.  Pt would like to go home if possible but is not confident that she will be safe and able to do what she needs as she is still very weak and limited.   Follow Up Recommendations  SNF (possible HHPT if she improves signficantly post colonoscopy)     Equipment Recommendations       Recommendations for Other Services       Precautions / Restrictions Restrictions Weight Bearing Restrictions: No    Mobility  Bed Mobility               General bed mobility comments: Pt sitting up at EOB on arrival, not tested  Transfers Overall transfer level: Modified independent Equipment used: Rolling walker (2 wheeled) Transfers: Sit to/from Stand Sit to Stand: Min guard         General transfer comment: Pt able to rise to standing and though she initially was impulsive and showed some minimal balance issues she ultimately was able to stand safely w/o direct  assist  Ambulation/Gait Ambulation/Gait assistance: Min guard Ambulation Distance (Feet): 90 Feet Assistive device: Rolling walker (2 wheeled)       General Gait Details: Pt again impulsive, but did not have any LOBs or significant safety concerns.  She did fatigue significantly with the effort of prolonged ambulation but overall did show good relative confidence and speed despite some impulsiveness/safety issues.    Stairs            Wheelchair Mobility    Modified Rankin (Stroke Patients Only)       Balance                                    Cognition Arousal/Alertness: Awake/alert Behavior During Therapy: WFL for tasks assessed/performed Overall Cognitive Status: Within Functional Limits for tasks assessed                      Exercises Total Joint Exercises Long Arc Quad: AROM;Both;10 reps Knee Flexion: AROM;Both;10 reps General Exercises - Lower Extremity Hip ABduction/ADduction: Strengthening;10 reps;Both Hip Flexion/Marching: 10 reps;Both;AROM;AAROM (L LE stronger, AAROM on R)    General Comments        Pertinent Vitals/Pain Pain Assessment:  (reports general chronic pain)    Home Living                      Prior  Function            PT Goals (current goals can now be found in the care plan section) Progress towards PT goals: Progressing toward goals    Frequency  Min 2X/week    PT Plan Current plan remains appropriate    Co-evaluation             End of Session Equipment Utilized During Treatment: Gait belt Activity Tolerance: Patient limited by fatigue Patient left: with bed alarm set;with family/visitor present;with call bell/phone within reach     Time: [id number] PT Time Calculation (min) (ACUTE ONLY): 28 min  Charges:  $Gait Training: 8-22 mins $Therapeutic Exercise: 8-22 mins                    G Codes:      Kreg Shropshire, DPT 04/27/2016, 3:36 PM

## 2016-04-27 NOTE — Care Management (Addendum)
Admitted to [facility] with the diagnosis of anasarca. Discharged from this facility 04/20/16. Lives with husband, [name], [phone]). Requested [facility] when discharged 04/20/16. Dr. [name] is listed as primary care physician. Uses a rolling walker to aid in ambulation. Prescriptions are filled ar [facility] in [place]. Physical therapy evaluation completed. Recommends skilled nursing facility. [name] RN MSN CCM Care management [phone]

## 2016-04-27 NOTE — Transfer of Care (Signed)
Immediate Anesthesia Transfer of Care Note  Patient: [name]  Procedure(s) Performed: Procedure(s): COLONOSCOPY WITH PROPOFOL (N/A)  Patient Location: PACU  Anesthesia Type:General  Level of Consciousness: sedated  Airway & Oxygen Therapy: spontaneous respirations with nasal cannula  Post-op Assessment: Report given to RN and Post -op Vital signs reviewed and stable  Post vital signs: Reviewed and stable  Last Vitals:  Vitals:   04/27/16 1316 04/27/16 1356  BP: (!) 92/42   Pulse: 80 92  Resp: 20 (!) 22  Temp: 37.1 C 36.4 C    Last Pain:  Vitals:   04/27/16 1356  TempSrc: Axillary  PainSc:          Complications: No apparent anesthesia complications

## 2016-04-27 NOTE — Progress Notes (Signed)
CSW attempted to give bed offers to patient. Patient is currently off the unit. CSW will complete at a later time.  Ernest Pine, MSW, LCSW, [place] Clinical Social Worker [phone]

## 2016-04-27 NOTE — Op Note (Signed)
[facility] Patient Name: [name] Procedure Date: 04/27/2016 1:24 PM MRN: [id number] Account #: [id number] Date of Birth: 09-03-1946 Admit Type: Inpatient Age: 70 Room: [facility] ENDO ROOM 4 Gender: Female Note Status: Finalized Procedure:            Colonoscopy Indications:          Chronic diarrhea Providers:            [name] MD, MD Referring MD:         Juline Patch, MD (Referring MD), Drue Second. [place]                        (Referring MD) Medicines:            Propofol per Anesthesia Complications:        No immediate complications. Procedure:            Pre-Anesthesia Assessment:                       - Prior to the procedure, a History and Physical was                        performed, and patient medications and allergies were                        reviewed. The patient's tolerance of previous                        anesthesia was also reviewed. The risks and benefits of                        the procedure and the sedation options and risks were                        discussed with the patient. All questions were                        answered, and informed consent was obtained. Prior                        Anticoagulants: The patient has taken no previous                        anticoagulant or antiplatelet agents. ASA Grade                        Assessment: II - A patient with mild systemic disease.                        After reviewing the risks and benefits, the patient was                        deemed in satisfactory condition to undergo the                        procedure.                       After obtaining informed consent, the colonoscope was  passed under direct vision. Throughout the procedure,                        the patient's blood pressure, pulse, and oxygen                        saturations were monitored continuously. The                        Colonoscope was introduced  through the descending                        colostomy and advanced to the the cecum, identified by                        appendiceal orifice and ileocecal valve. The                        colonoscopy was performed without difficulty. The                        patient tolerated the procedure well. The quality of                        the bowel preparation was excellent. Findings:      A 8 mm polyp was found in the transverse colon. The polyp was       pedunculated. The polyp was removed with a hot snare. Resection and       retrieval were complete.      The exam was otherwise without abnormality.      Random biopsies were obtained with cold forceps for histology randomly. Impression:           - One 8 mm polyp in the transverse colon, removed with                        a hot snare. Resected and retrieved.                       - The examination was otherwise normal.                       - Random biopsies were obtained. Recommendation:       - Await pathology results. Procedure Code(s):    --- Professional ---                       [phone], Colonoscopy through stoma; with removal of                        tumor(s), polyp(s), or other lesion(s) by snare                        technique                       [id number], 77, Colonoscopy through stoma; with biopsy,                        single or multiple Diagnosis Code(s):    --- Professional ---  K52.9, Noninfective gastroenteritis and colitis,                        unspecified                       D12.3, Benign neoplasm of transverse colon (hepatic                        flexure or splenic flexure) CPT copyright 2016 American Medical Association. All rights reserved. The codes documented in this report are preliminary and upon coder review may  be revised to meet current compliance requirements. [name] MD, MD 04/27/2016 2:04:19 PM This report has been signed electronically. Number of Addenda: 0 Note  Initiated On: 04/27/2016 1:24 PM Scope Withdrawal Time: 0 hours 8 minutes 21 seconds  Total Procedure Duration: 0 hours 17 minutes 34 seconds       [facility]

## 2016-04-27 NOTE — Progress Notes (Signed)
PHARMACY CONSULT NOTE -follow up  Pharmacy Consult for Electrolyte management   No Known Allergies  Patient Measurements: Height: 5\' 2"  (157.5 cm) Weight: 162 lb 4.8 oz (73.6 kg) IBW/kg (Calculated) : 50.1  Vital Signs: Temp: 98.2 F (36.8 C) (08/25 0518) BP: 88/56 (08/25 0518) Pulse Rate: 93 (08/25 0518) Intake/Output from previous day: 08/24 0701 - 08/25 0700 In: 840 [P.O.:840] Out: 3900 [Urine:1500; Stool:2400] Intake/Output from this shift: No intake/output data recorded.  Labs:   Recent Labs  04/25/16 1841 04/26/16 0455 04/27/16 0441  NA 138 139  --   K 3.3* 3.7 3.1*  CL 104 104  --   CO2 27 28  --   GLUCOSE 154* 103*  --   BUN 24* 24*  --   CREATININE 1.40* 1.49*  --   CALCIUM 7.1* 7.3*  --   MG 1.6* 2.0 1.7  PHOS  --  2.2* 2.2*  ALBUMIN  --  2.0*  --    Estimated Creatinine Clearance: 33.5 mL/min (by C-G formula based on SCr of 1.49 mg/dL).   No results for input(s): GLUCAP in the last 72 hours.  Medical History: Past Medical History:  Diagnosis Date  . Cancer [facility]) 2011   endometrial  . Chronic kidney disease    STONES  . Diverticulitis of colon (without mention of hemorrhage)   . History of kidney stones 2011  . Mechanical complication of colostomy and enterostomy   . Obesity, unspecified   . Personal history of tobacco use, presenting hazards to health    Assessment: 70 yo female admitted with anasarca, profuse diarrhea. Pt. Has colostomy Pharmacy has been consulted for electrolyte management.   8/25:  K: 3.1     Mg:1.7    Phos: 2.2    Na 139           Ca: 7.3*  Albumin: 2.0             Albumin corrected Ca: 8.9*      Plan:  Patient is taking KCL 40 MEQ po BID and furosemide 10mg  po daily. Also on Calcium (Tums) 2 tabs TID and Sodium Bicarb 1300mg  bid.    Will order Magnesium sulfate 4 gram IV x 1. Will order Potassium Phosphate 30 mmol IV x 1. (This also provides 44 meq of K+)  Will recheck electrolytes in the AM.  Rolonda Pontarelli  A 04/27/2016,8:27 AM

## 2016-04-28 LAB — CBC
HEMATOCRIT: 24.3 % — AB (ref 35.0–47.0)
HEMOGLOBIN: 8.3 g/dL — AB (ref 12.0–16.0)
MCH: 32.4 pg (ref 26.0–34.0)
MCHC: 34.2 g/dL (ref 32.0–36.0)
MCV: 94.7 fL (ref 80.0–100.0)
Platelets: 283 10*3/uL (ref 150–440)
RBC: 2.56 MIL/uL — AB (ref 3.80–5.20)
RDW: 15.9 % — ABNORMAL HIGH (ref 11.5–14.5)
WBC: 6.7 10*3/uL (ref 3.6–11.0)

## 2016-04-28 LAB — MAGNESIUM: MAGNESIUM: 2.1 mg/dL (ref 1.7–2.4)

## 2016-04-28 LAB — BASIC METABOLIC PANEL
Anion gap: 10 (ref 5–15)
BUN: 15 mg/dL (ref 6–20)
CHLORIDE: 104 mmol/L (ref 101–111)
CO2: 25 mmol/L (ref 22–32)
CREATININE: 1.27 mg/dL — AB (ref 0.44–1.00)
Calcium: 7.4 mg/dL — ABNORMAL LOW (ref 8.9–10.3)
GFR calc Af Amer: 49 mL/min — ABNORMAL LOW (ref >60)
GFR calc non Af Amer: 42 mL/min — ABNORMAL LOW (ref >60)
Glucose, Bld: 111 mg/dL — ABNORMAL HIGH (ref 65–99)
POTASSIUM: 3.2 mmol/L — AB (ref 3.5–5.1)
SODIUM: 139 mmol/L (ref 135–145)

## 2016-04-28 LAB — PHOSPHORUS: Phosphorus: 3.2 mg/dL (ref 2.5–4.6)

## 2016-04-28 MED ORDER — FUROSEMIDE 20 MG PO TABS
10.0000 mg | ORAL_TABLET | Freq: Two times a day (BID) | ORAL | Status: DC
  Administered 2016-04-28 – 2016-04-29 (×2): 10 mg via ORAL
  Filled 2016-04-28 (×3): qty 1

## 2016-04-28 MED ORDER — RIFAXIMIN 550 MG PO TABS
550.0000 mg | ORAL_TABLET | Freq: Three times a day (TID) | ORAL | Status: DC
  Administered 2016-04-28 – 2016-05-01 (×7): 550 mg via ORAL
  Filled 2016-04-28 (×7): qty 1

## 2016-04-28 MED ORDER — POTASSIUM CHLORIDE 20 MEQ PO PACK
40.0000 meq | PACK | Freq: Once | ORAL | Status: AC
  Administered 2016-04-28: 40 meq via ORAL
  Filled 2016-04-28: qty 2

## 2016-04-28 MED ORDER — NYSTATIN 100000 UNIT/ML MT SUSP
5.0000 mL | Freq: Four times a day (QID) | OROMUCOSAL | Status: DC
  Administered 2016-04-29: 500000 [IU] via ORAL
  Filled 2016-04-28 (×5): qty 5

## 2016-04-28 NOTE — Clinical Social Work Note (Signed)
CSW attempted to give bed offers. Patient advised that she will be going home with home health pt. CSW signing off.  Santiago Bumpers, MSW LCSW-A [phone]

## 2016-04-28 NOTE — Consult Note (Signed)
GI Inpatient Consult Note  Reason for Consult: Anasarca, diarrhea   Attending Requesting Consult: Dr. Tressia Miners  History of Present Illness: [name] is a 70 y.o. 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  She presented to the [facility] from the [place] after she was found to have significant, painful extremity edema and facial puffiness while at a follow-up visit with Dr. Mike Gip.  Patient noted high colostomy output also continued despite Octreotide.  She was directly admitted by the hospitalist service for further evaluation.  Since admission, patient received IV albumin and was started on oral furosemide 10mg  BID.  She underwent a colonoscopy on 04/27/16 with Dr. Allen Norris, which was notable for an 20mm polyp in the transverse colon.  Pathology from random biopsies obtained to exclude microscopic colitis, as well as the polyp, is pending.  Given the largely normal colonoscopy, a course of Xifaxan 550mg  TID was initiated.  Of note, patient was admitted to [facility] from 03/18/16-03/23/16, 03/25/16-03/28/16, and 04/12/16-04/20/16 due to ARF secondary to increased output from her colostomy.  GI panel and C diff testing returned negative. Diarrhea became managed after initiation of SQ Octreotide.  Albumin was during these hospitalizations as well.  Patient states that she began having liquid stools in June; prior to June, stools were formed and soft.  She called Dr. Bary Castilla, who recommended a CT a/p.  A new mass lesion in the R hemipelvis was noted, and recurrent endometrial cancer was subsequently diagnosed.  Since then, patient states stools have remained liquid.  Octreotide will yield semi-solid stools for about 3 days intermittently, then return to liquid.  Today, patient  reports she is overall feeling quite well.  She is pleased the edema in her hands and feet has improved greatly.  She continues to have liquid stool output from the ostomy, but states she is not bothered by this.  She states she feels reassured that her testing thus far has returned negative.  She denies additional GI complaints, including unexplained weight loss, appetite changes, abdominal pain, hematochezia, or melena.  Past Medical History:  Past Medical History:  Diagnosis Date  . Cancer [facility]) 2011   endometrial  . Chronic kidney disease    STONES  . Diverticulitis of colon (without mention of hemorrhage)   . History of kidney stones 2011  . Mechanical complication of colostomy and enterostomy   . Obesity, unspecified   . Personal history of tobacco use, presenting hazards to health     Problem List: Patient Active Problem List   Diagnosis Date Noted  . Benign neoplasm of transverse colon   . Chronic diarrhea of unknown origin   . Hypocalcemia 04/26/2016  . Anasarca 04/25/2016  . Malnutrition ([place]) 04/25/2016  . Electrolyte disorder   . Thrombocytopenia ([place])   . Leukopenia   . ARF (acute renal failure) ([place]) 04/12/2016  . Bacteriuria with pyuria 03/28/2016  . Acute respiratory failure with hypoxia ([place]) 03/28/2016  . Atelectasis 03/28/2016  . Metabolic acidosis [id number]  . Diarrhea   . Dehydration   . Acute renal failure (ARF) ([place]) 03/18/2016  . Hypomagnesemia 03/07/2016  . Watery stools 01/23/2016  . Generalized abdominal pain 01/23/2016  . Endometrial cancer ([place]) 02/10/2015  . Paroxysmal supraventricular tachycardia ([place]) 05/24/2014  . History of endometrial cancer 04/09/2014  . Tubular adenomas removed from the transverse colon in June  2010. Hyperplastic polyps identified in the sigmoid colon. 03/02/2014  . History of colon polyps 03/02/2014  . Ventral hernia 09/29/2013  . 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 Complication of external stoma of  gastrointestinal tract 02/19/2013    Past Surgical History: Past Surgical History:  Procedure Laterality Date  . ABDOMINAL HYSTERECTOMY  2011  . APPENDECTOMY    . CHOLECYSTECTOMY  05/07/14   Incidental during ventral hernia repair.   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 COLON SURGERY  05/20/1999   Drainage of pelvic abscess, end colostomy  . COLON SURGERY  11/15/2010   Revision of colostomy stoma  . COLONOSCOPY  June 2010,03/31/14   Tubular adenoma of the transverse colon, hyperplastic polyps of the sigmoid.  Marland Kitchen COLOSTOMY  2011  . DILATION AND CURETTAGE OF UTERUS    . HERNIA REPAIR  04/06/14   ventral hernia, tetro rectus Ventralex ST mesh, 20 x 33 cm.   Marland Kitchen HYSTEROSCOPY  2011  . LITHOTRIPSY  2009  . MOLE REMOVAL  2003  . PORTACATH PLACEMENT N/A 03/01/2016   Procedure: INSERTION PORT-A-CATH;  Surgeon: [name], MD;  Location: [facility] ORS;  Service: General;  Laterality: N/A;  . PYELOLITHOTOMY    . skin cancer removal  02/2014  . TUBAL LIGATION      Allergies: No Known Allergies  Home Medications: Prescriptions Prior to Admission  Medication Sig Dispense Refill Last Dose  . calcium carbonate (TUMS - DOSED IN MG ELEMENTAL CALCIUM) 500 MG chewable tablet Chew 2 tablets (400 mg of elemental calcium total) by mouth 3 (three) times daily with meals. 90 tablet 1 Taking  . feeding supplement (BOOST / RESOURCE BREEZE) LIQD Take 1 Container by mouth 3 (three) times daily between meals. (Patient not taking: Reported on 04/25/2016) 90 Container 0 Not Taking  . LORazepam (ATIVAN) 0.5 MG tablet Take 1 tablet (0.5 mg total) by mouth every 8 (eight) hours as needed for anxiety. 30 tablet 0 Taking  . magnesium oxide (MAG-OX) 400 (241.3 Mg) MG tablet Take 1 tablet (400 mg total) by mouth 2 (two) times daily. 60 tablet 1 Taking  . octreotide (SANDOSTATIN) 100 MCG/ML SOLN injection Inject 1 mL (100 mcg total) into the  skin every 8 (eight) hours. 13 mL 0 Taking  . ondansetron (ZOFRAN) 8 MG tablet Take 8 mg by mouth 2 (two) times daily as needed.    Taking  . potassium chloride (KLOR-CON) 20 MEQ packet Take 40 mEq by mouth 2 (two) times daily. 60 packet 1 Taking  . promethazine (PHENERGAN) 25 MG tablet Take 1 tablet (25 mg total) by mouth every 8 (eight) hours as needed for nausea or vomiting. 30 tablet 0 Taking  . sodium bicarbonate 650 MG tablet Take 2 tablets (1,300 mg total) by mouth 2 (two) times daily. 60 tablet 6 Taking  . Syringe, Disposable, 1 ML MISC 1 Units by Does not apply route 3 (three) times daily. (Patient not taking: Reported on 04/26/2016) 25 each 0 Not Taking at Unknown time   Home medication reconciliation was completed with the patient.   Scheduled Inpatient Medications:   . calcium carbonate  400 mg of elemental calcium Oral TID WC  . feeding supplement (ENSURE ENLIVE)  1 Bottle Oral Q24H  . furosemide  10 mg Oral BID  . octreotide  100 mcg Subcutaneous Q8H  . potassium chloride  40 mEq Oral BID  . rifaximin  550 mg  Oral TID    Continuous Inpatient Infusions:      PRN Inpatient Medications:  acetaminophen **OR** acetaminophen, LORazepam, ondansetron, promethazine  Family History: family history includes Breast cancer in her maternal grandmother; Cancer in her mother; Colon cancer in her mother.    Social History:   reports that she has been smoking Cigarettes.  She has a 3.75 pack-year smoking history. She has never used smokeless tobacco. She reports that she does not drink alcohol or use drugs.   Review of Systems: Constitutional: Weight is stable.  Eyes: No changes in vision. ENT: No oral lesions, sore throat.  GI: see HPI.  Heme/Lymph: No easy bruising.  CV: No chest pain.  GU: No hematuria.  Integumentary: No rashes.  Neuro: No headaches.  Psych: No depression/anxiety.  Endocrine: No heat/cold intolerance.  Allergic/Immunologic: No urticaria.  Resp: No cough,  SOB.  Musculoskeletal: No joint swelling.    Physical Examination: BP (!) 99/43 (BP Location: Left Arm)   Pulse 93   Temp (!) 100.4 F (38 C) (Oral)   Resp 17   Ht 5\' 2"  (1.575 m)   Wt 73.6 kg (162 lb 4.8 oz)   SpO2 91%   BMI 29.69 kg/m  Gen: NAD, alert and oriented x 4 HEENT: PEERLA, EOMI, Neck: supple, no JVD or thyromegaly Chest: CTA bilaterally, no wheezes, crackles, or other adventitious sounds CV: RRR, no m/g/c/r Abd: soft, NT, ND, +BS in all four quadrants; no HSM, guarding, ridigity, or rebound tenderness; liquid stool noted in colostomy bag, no redness, warmth, erythema, or draining from stoma Ext: no edema, well perfused with 2+ pulses, Skin: no rash or lesions noted Lymph: no LAD  Data: Lab Results  Component Value Date   WBC 6.7 04/28/2016   HGB 8.3 (L) 04/28/2016   HCT 24.3 (L) 04/28/2016   MCV 94.7 04/28/2016   PLT 283 04/28/2016    Recent Labs Lab 04/23/16 1028 04/28/16 0532  HGB 10.2* 8.3*   Lab Results  Component Value Date   NA 139 04/28/2016   K 3.2 (L) 04/28/2016   CL 104 04/28/2016   CO2 25 04/28/2016   BUN 15 04/28/2016   CREATININE 1.27 (H) 04/28/2016   Lab Results  Component Value Date   ALT 25 04/23/2016   AST 29 04/23/2016   ALKPHOS 147 (H) 04/23/2016   BILITOT 0.7 04/23/2016   No results for input(s): APTT, INR, PTT in the last 168 hours.   Assessment/Plan: Ms. Peagler is a 70 y.o. 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  She continues to experience high output of semi-solid stool from her colostomy despite Octreotide.  Stool studies and colonoscopy have returned negative, which is reassuring.  However, etiology of diarrhea remains unclear.    Recommendations: - Consider Octreotide LAR 30mg  IM q 4  weeks - Further recs per Dr. Vira Agar upon discussion with oncology and hospitalist service  Thank you for the consult. We will follow along with you. Please call with questions or concerns.  Lavera Guise, PA-C [facility] Phone: [phone] Pager: [phone]

## 2016-04-28 NOTE — Progress Notes (Signed)
1800; Patient spent day in room Up to [facility] often to urinate. Colostomy very active today. VS - BP low but stable. Tylenol given earlier for low grade temp. Patient non complaining.

## 2016-04-28 NOTE — Progress Notes (Signed)
Subjective:   Colonoscopy for later today.  Edema improved.   UOP 1500 Ostomy 2400  Objective:  Vital signs in last 24 hours:  Temp:  [97.5 F (36.4 C)-98.8 F (37.1 C)] 98.7 F (37.1 C) (08/26 0808) Pulse Rate:  [76-92] 87 (08/26 0808) Resp:  [7-22] 7 (08/26 0520) BP: (81-114)/(42-67) 102/53 (08/26 0808) SpO2:  [93 %-99 %] 96 % (08/26 0520)  Weight change:  Filed Weights   04/25/16 1518  Weight: 73.6 kg (162 lb 4.8 oz)    Intake/Output:    Intake/Output Summary (Last 24 hours) at 04/28/16 1101 Last data filed at 04/28/16 0800  Gross per 24 hour  Intake             1520 ml  Output             1625 ml  Net             -105 ml     Physical Exam: General: No acute distress, laying in the bed   HEENT Moist oral mucous membranes   Neck Supple   Pulm/lungs Clear   CVS/Heart S1S2 no rubs  Abdomen:  Soft, nontender, nondistended, colostomy in place   Extremities: 1+ peripheral edema   Neurologic: Alert, oriented   Skin: Scaly lesions bilateral legs           Basic Metabolic Panel:   Recent Labs Lab 04/23/16 1028 04/25/16 1841 04/26/16 0455 04/27/16 0441 04/28/16 0532  NA 137 138 139  --  139  K 4.4 3.3* 3.7 3.1* 3.2*  CL 103 104 104  --  104  CO2 27 27 28   --  25  GLUCOSE 145* 154* 103*  --  111*  BUN 20 24* 24*  --  15  CREATININE 1.34* 1.40* 1.49*  --  1.27*  CALCIUM 7.0* 7.1* 7.3*  --  7.4*  MG 1.2* 1.6* 2.0 1.7 2.1  PHOS  --   --  2.2* 2.2* 3.2     CBC:  Recent Labs Lab 04/23/16 1028 04/28/16 0532  WBC 3.4* 6.7  NEUTROABS 1.8  --   HGB 10.2* 8.3*  HCT 30.8* 24.3*  MCV 96.6 94.7  PLT 96* 283      Microbiology:  No results found for this or any previous visit (from the past 720 hour(s)).  Coagulation Studies: No results for input(s): LABPROT, INR in the last 72 hours.  Urinalysis: No results for input(s): COLORURINE, LABSPEC, PHURINE, GLUCOSEU, HGBUR, BILIRUBINUR, KETONESUR, PROTEINUR, UROBILINOGEN, NITRITE, LEUKOCYTESUR in  the last 72 hours.  Invalid input(s): APPERANCEUR    Imaging: No results found.   Medications:     . calcium carbonate  400 mg of elemental calcium Oral TID WC  . feeding supplement (ENSURE ENLIVE)  1 Bottle Oral Q24H  . furosemide  10 mg Oral BID  . octreotide  100 mcg Subcutaneous Q8H  . potassium chloride  40 mEq Oral BID   acetaminophen **OR** acetaminophen, LORazepam, ondansetron, promethazine  Assessment/ Plan:  70 y.o.white female with history of Endometrial cancer history of diverticulitis status post colostomy,   1. Hypoalbuminemia with anasarca: protein losses from high output colostomy - IV albumin - PO furosemide   2. Acute renal failure on chronic kidney disease stage III: creatinine fluctuates with fluid status. Creatinine back to baseline.   3. Hypocalcemia: corrected calcium of 8.9.  - continue calcium carbonate  4. Hypokalemia: on PO and IV replacement - discontinued sodium bicarbonate.   Outpatient follow up with Dr. Murlean Iba  9/11 at 2:20pm   LOS: 3 [place] 8/26/201711:01 AM

## 2016-04-28 NOTE — Progress Notes (Signed)
[place] [facility] [place] NAME: [name]    MR#:  [id number]  DATE OF BIRTH:  [birth year]  SUBJECTIVE:  CHIEF COMPLAINT:  No chief complaint on file.  - Colonoscopy done yesterday showing a polyp and otherwise normal colon. -Continues to have semi solid diarrhea. Starts to feel some swelling of her feet now.  REVIEW OF SYSTEMS:  Review of Systems  Constitutional: Positive for malaise/fatigue. Negative for chills and fever.  HENT: Negative for ear discharge, ear pain and nosebleeds.   Eyes: Negative for blurred vision and double vision.  Respiratory: Negative for cough, shortness of breath and wheezing.   Cardiovascular: Positive for leg swelling. Negative for chest pain and palpitations.  Gastrointestinal: Positive for diarrhea. Negative for abdominal pain, constipation, nausea and vomiting.  Genitourinary: Negative for dysuria.  Musculoskeletal: Positive for myalgias.  Neurological: Negative for dizziness, sensory change, speech change, focal weakness, seizures and headaches.  Psychiatric/Behavioral: Negative for depression.    DRUG ALLERGIES:  No Known Allergies  VITALS:  Blood pressure (!) 102/53, pulse 87, temperature 98.7 F (37.1 C), temperature source Oral, resp. rate (!) 7, height 5\' 2"  (1.575 m), weight 73.6 kg (162 lb 4.8 oz), SpO2 96 %.  PHYSICAL EXAMINATION:  Physical Exam  GENERAL:  70 y.o.-year-old patient lying in the bed with no acute distress.  EYES: Pupils equal, round, reactive to light and accommodation. No scleral icterus. Extraocular muscles intact.  HEENT: Head atraumatic, normocephalic. Oropharynx and nasopharynx clear.  NECK:  Supple, no jugular venous distention. No thyroid enlargement, no tenderness.  LUNGS: Normal breath sounds bilaterally, no wheezing, rales,rhonchi or crepitation. No use of accessory muscles of respiration. Decreased bibasilar breath sounds. CARDIOVASCULAR: S1, S2 normal. No  murmurs, rubs, or gallops.  ABDOMEN: Soft, nontender, nondistended. Bowel sounds present. No organomegaly or mass. Colostomy bag in place EXTREMITIES: No cyanosis, or clubbing. 1+ pedal edema NEUROLOGIC: Cranial nerves II through XII are intact. Muscle strength 5/5 in all extremities. Sensation intact. Gait not checked.  PSYCHIATRIC: The patient is alert and oriented x 3.  SKIN: No obvious rash, lesion, or ulcer.    LABORATORY PANEL:   CBC  Recent Labs Lab 04/28/16 0532  WBC 6.7  HGB 8.3*  HCT 24.3*  PLT 283   ------------------------------------------------------------------------------------------------------------------  Chemistries   Recent Labs Lab 04/23/16 1028  04/28/16 0532  NA 137  < > 139  K 4.4  < > 3.2*  CL 103  < > 104  CO2 27  < > 25  GLUCOSE 145*  < > 111*  BUN 20  < > 15  CREATININE 1.34*  < > 1.27*  CALCIUM 7.0*  < > 7.4*  MG 1.2*  < > 2.1  AST 29  --   --   ALT 25  --   --   ALKPHOS 147*  --   --   BILITOT 0.7  --   --   < > = values in this interval not displayed. ------------------------------------------------------------------------------------------------------------------  Cardiac Enzymes No results for input(s): TROPONINI in the last 168 hours. ------------------------------------------------------------------------------------------------------------------  RADIOLOGY:  No results found.  EKG:   Orders placed or performed during the hospital encounter of 04/12/16  . EKG 12-Lead  . EKG 12-Lead  . EKG 12-Lead  . EKG 12-Lead  . EKG    ASSESSMENT AND PLAN:   70 year old female with past medical history significant for endometrial carcinoma currently receiving chemotherapy, chronic kidney disease, diverticulitis, colostomy with chronic diarrhea presents to hospital  secondary to worsening lower extremity edema noted to be severely hypoalbumineic  #1 anasarca-secondary to hypoalbuminemia -Continue IV albumin daily.Check albumin level  tomorrow -Improving swelling now. -Appreciate nephrology consult. Lasix changed to oral dose. Also add TED stockings -Severe malnourishment and increased GI output from colostomy bag.  #2 chronic diarrhea-with high output through colostomy. Secondary to chemotherapy. -Appreciate GI consult. Colonoscopy with polyp that was sent for biopsy. -Restarted octreotide -Try rifaximin to treat for any bacterial overgrowth. GI to follow up today  #3 hypokalemia-replaced appropriately  #4 endometrial cancer-recurrent stage III endometrial cancer on chemotherapy. Follows at the cancer center. Management per oncology. That is post 2 cycles of chemotherapy so far  #5 thrombocytopenia- secondary to chemotherapy-stable at this time. Follow-up  #6 DVT prophylaxis-Ted's and SCDs  Last physical therapy evaluation recommended rehabilitation. Re-evaluate.   All the records are reviewed and case discussed with Care Management/Social Workerr. Management plans discussed with the patient, family and they are in agreement.  CODE STATUS: Full code  TOTAL TIME TAKING CARE OF THIS PATIENT: 37 minutes.   POSSIBLE D/C IN 1-2 DAYS, DEPENDING ON CLINICAL CONDITION.   [name] M.D on 04/28/2016 at 1:15 PM  Between 7am to 6pm - Pager - [phone]  After 6pm go to [email or web address] - password [email or web address] [facility]  Office  [phone]  CC: Primary care physician; [name], MD

## 2016-04-28 NOTE — Progress Notes (Signed)
PHARMACY CONSULT NOTE -follow up  Pharmacy Consult for Electrolyte management   No Known Allergies  Patient Measurements: Height: 5\' 2"  (157.5 cm) Weight: 162 lb 4.8 oz (73.6 kg) IBW/kg (Calculated) : 50.1  Vital Signs: Temp: 98.2 F (36.8 C) (08/26 0520) Temp Source: Oral (08/26 0520) BP: 109/65 (08/26 0520) Pulse Rate: 76 (08/26 0520) Intake/Output from previous day: 08/25 0701 - 08/26 0700 In: 1280 [P.O.:480; I.V.:200; IV Piggyback:600] Out: 1875 [Urine:775; Stool:1100] Intake/Output from this shift: Total I/O In: 240 [P.O.:240] Out: 1200 [Urine:400; Stool:800]  Labs:   Recent Labs  04/25/16 1841 04/26/16 0455 04/27/16 0441 04/28/16 0532  NA 138 139  --  139  K 3.3* 3.7 3.1* 3.2*  CL 104 104  --  104  CO2 27 28  --  25  GLUCOSE 154* 103*  --  111*  BUN 24* 24*  --  15  CREATININE 1.40* 1.49*  --  1.27*  CALCIUM 7.1* 7.3*  --  7.4*  MG 1.6* 2.0 1.7 2.1  PHOS  --  2.2* 2.2* 3.2  ALBUMIN  --  2.0*  --   --    Estimated Creatinine Clearance: 39.3 mL/min (by C-G formula based on SCr of 1.27 mg/dL).   No results for input(s): GLUCAP in the last 72 hours.  Medical History: Past Medical History:  Diagnosis Date  . Cancer [facility]) 2011   endometrial  . Chronic kidney disease    STONES  . Diverticulitis of colon (without mention of hemorrhage)   . History of kidney stones 2011  . Mechanical complication of colostomy and enterostomy   . Obesity, unspecified   . Personal history of tobacco use, presenting hazards to health    Assessment: 70 yo female admitted with anasarca, profuse diarrhea. Pt. Has colostomy Pharmacy has been consulted for electrolyte management.   8/25:  K: 3.1     Mg:1.7    Phos: 2.2    Na 139           Ca: 7.3*  Albumin: 2.0             Albumin corrected Ca: 8.9*      Plan:  Patient is taking KCL 40 MEQ po BID and furosemide 10mg  po daily. Also on Calcium (Tums) 2 tabs TID and Sodium Bicarb 1300mg  bid.    Will order Magnesium sulfate  4 gram IV x 1. Will order Potassium Phosphate 30 mmol IV x 1. (This also provides 44 meq of K+)  Will recheck electrolytes in the AM.  8/26 AM electrolytes K+ 3.2. Corrected calcium 9.0. Will order 1x dose of 40 mEq KCL in addition to current daily supplementation. Recheck BMP tomorrow AM.  Tom Ragsdale S 04/28/2016,6:20 AM

## 2016-04-29 LAB — COMPREHENSIVE METABOLIC PANEL
ALBUMIN: 2.5 g/dL — AB (ref 3.5–5.0)
ALT: 15 U/L (ref 14–54)
ANION GAP: 11 (ref 5–15)
AST: 17 U/L (ref 15–41)
Alkaline Phosphatase: 113 U/L (ref 38–126)
BILIRUBIN TOTAL: 0.3 mg/dL (ref 0.3–1.2)
BUN: 16 mg/dL (ref 6–20)
CHLORIDE: 105 mmol/L (ref 101–111)
CO2: 22 mmol/L (ref 22–32)
Calcium: 7.5 mg/dL — ABNORMAL LOW (ref 8.9–10.3)
Creatinine, Ser: 1.45 mg/dL — ABNORMAL HIGH (ref 0.44–1.00)
GFR calc Af Amer: 42 mL/min — ABNORMAL LOW (ref >60)
GFR, EST NON AFRICAN AMERICAN: 36 mL/min — AB (ref >60)
Glucose, Bld: 102 mg/dL — ABNORMAL HIGH (ref 65–99)
POTASSIUM: 2.7 mmol/L — AB (ref 3.5–5.1)
Sodium: 138 mmol/L (ref 135–145)
TOTAL PROTEIN: 4.5 g/dL — AB (ref 6.5–8.1)

## 2016-04-29 LAB — PHOSPHORUS: PHOSPHORUS: 3 mg/dL (ref 2.5–4.6)

## 2016-04-29 LAB — POTASSIUM: POTASSIUM: 3 mmol/L — AB (ref 3.5–5.1)

## 2016-04-29 LAB — MAGNESIUM: MAGNESIUM: 1.6 mg/dL — AB (ref 1.7–2.4)

## 2016-04-29 MED ORDER — SODIUM CHLORIDE 0.9 % IV BOLUS (SEPSIS)
500.0000 mL | INTRAVENOUS | Status: AC
  Administered 2016-04-29: 15:00:00 500 mL via INTRAVENOUS

## 2016-04-29 MED ORDER — POTASSIUM CHLORIDE 10 MEQ/100ML IV SOLN
10.0000 meq | INTRAVENOUS | Status: AC
  Administered 2016-04-29 (×3): 10 meq via INTRAVENOUS
  Filled 2016-04-29 (×3): qty 100

## 2016-04-29 MED ORDER — ACETAMINOPHEN-CODEINE #3 300-30 MG PO TABS
1.0000 | ORAL_TABLET | Freq: Three times a day (TID) | ORAL | Status: DC
Start: 2016-04-29 — End: 2016-05-01
  Administered 2016-04-29 – 2016-05-01 (×5): 1 via ORAL
  Filled 2016-04-29 (×5): qty 1

## 2016-04-29 MED ORDER — POTASSIUM CHLORIDE 20 MEQ PO PACK
20.0000 meq | PACK | Freq: Once | ORAL | Status: AC
Start: 2016-04-29 — End: 2016-04-29
  Administered 2016-04-29: 10:00:00 20 meq via ORAL
  Filled 2016-04-29: qty 1

## 2016-04-29 MED ORDER — MAGNESIUM SULFATE 4 GM/100ML IV SOLN
4.0000 g | Freq: Once | INTRAVENOUS | Status: AC
  Administered 2016-04-29: 10:00:00 4 g via INTRAVENOUS
  Filled 2016-04-29: qty 100

## 2016-04-29 NOTE — Progress Notes (Signed)
PHARMACY CONSULT NOTE -follow up  Pharmacy Consult for Electrolyte management   No Known Allergies  Patient Measurements: Height: 5\' 2"  (157.5 cm) Weight: 162 lb 4.8 oz (73.6 kg) IBW/kg (Calculated) : 50.1  Vital Signs: Temp: 98.3 F (36.8 C) (08/27 0604) Temp Source: Oral (08/27 0604) BP: 93/54 (08/27 0604) Pulse Rate: 94 (08/27 0604) Intake/Output from previous day: 08/26 0701 - 08/27 0700 In: 720 [P.O.:720] Out: 2950 [Urine:650; Stool:2300] Intake/Output from this shift: No intake/output data recorded.  Labs:   Recent Labs  04/27/16 0441 04/28/16 0532 04/29/16 0504  NA  --  139 138  K 3.1* 3.2* 2.7*  CL  --  104 105  CO2  --  25 22  GLUCOSE  --  111* 102*  BUN  --  15 16  CREATININE  --  1.27* 1.45*  CALCIUM  --  7.4* 7.5*  MG 1.7 2.1 1.6*  PHOS 2.2* 3.2 3.0  PROT  --   --  4.5*  ALBUMIN  --   --  2.5*  AST  --   --  17  ALT  --   --  15  ALKPHOS  --   --  113  BILITOT  --   --  0.3   Estimated Creatinine Clearance: 34.4 mL/min (by C-G formula based on SCr of 1.45 mg/dL).   No results for input(s): GLUCAP in the last 72 hours.  Medical History: Past Medical History:  Diagnosis Date  . Cancer [facility]) 2011   endometrial  . Chronic kidney disease    STONES  . Diverticulitis of colon (without mention of hemorrhage)   . History of kidney stones 2011  . Mechanical complication of colostomy and enterostomy   . Obesity, unspecified   . Personal history of tobacco use, presenting hazards to health    Assessment: 70 yo female admitted with anasarca, profuse diarrhea. Pt. Has colostomy. Pharmacy has been consulted for electrolyte management.   Patient is on KCL 40 MEQ po BID and furosemide 10mg  po bid.  Also on Calcium (Tums) 2 tabs TID and Sodium Bicarb 1300mg  bid.     8/27:  K: 2.7     Mg: 1.6    Phos:3.0    Na: 138           Ca: 7.5*  Albumin: 2.5             Albumin corrected Ca: 8.7*      Plan:  MD has ordered KCL IV 33meq x 1.  Patient  refused evening KCL last night 8/26.  Will order additional KCl 69meq PO to be given with standing order at 1000. Will order Magnesium 4 gram IV x 1.  Patient continues with liquid stool in colostomy. Will recheck KCL at 1800. Will recheck electrolytes in the AM.   Liliann File A 04/29/2016,8:39 AM

## 2016-04-29 NOTE — Progress Notes (Signed)
Notified Dr Marcille Blanco of potassium of 2.7.  Pt refused last evenings dose of Klor-Con. No new orders. Dorna Bloom RN

## 2016-04-29 NOTE — Consult Note (Signed)
Pt with endometrial cancer, previous colon resection with ostomy.  Diarrhea resistant to current treatment.  Will try codeine 30 mg po tid to see if this helps.  Discussed with patient.

## 2016-04-29 NOTE — Plan of Care (Signed)
Problem: Fluid Volume: Goal: Ability to maintain a balanced intake and output will improve Outcome: Progressing BP 78/40 on L arm. BP 88/48 on R arm. MD notified, 526ml bolus given, BP up to 99/59. MD aware, lasix held.  Problem: Nutrition: Goal: Adequate nutrition will be maintained Outcome: Not Progressing Poor appetite. Spouse brought lunch, but pt had only few bites. C/o nausea, vomited once, zofran given with improvement. Pt declined some of her meds due to feeling sick on her stomach, but declined further action. Pt encouraged to drink PO fluids and Ensure.  Problem: Bowel/Gastric: Goal: Will not experience complications related to bowel motility Outcome: Progressing 254ml of soft/liquid stool throughout the shift.

## 2016-04-29 NOTE — Progress Notes (Signed)
[place] at [place] NAME: [name]    MR#:  [id number]  DATE OF BIRTH:  05/26/1946  SUBJECTIVE:  CHIEF COMPLAINT:  No chief complaint on file.  - Feels depressed, unsure of how long she has to stay in the hospital. Emotionally labile. -Continues to have increased diarrhea. Complains of ankle swelling.  REVIEW OF SYSTEMS:  Review of Systems  Constitutional: Positive for malaise/fatigue. Negative for chills and fever.  HENT: Negative for ear discharge, ear pain and nosebleeds.   Eyes: Negative for blurred vision and double vision.  Respiratory: Negative for cough, shortness of breath and wheezing.   Cardiovascular: Positive for leg swelling. Negative for chest pain and palpitations.  Gastrointestinal: Positive for diarrhea. Negative for abdominal pain, constipation, nausea and vomiting.  Genitourinary: Negative for dysuria.  Musculoskeletal: Positive for myalgias.  Neurological: Negative for dizziness, sensory change, speech change, focal weakness, seizures and headaches.  Psychiatric/Behavioral: Negative for depression.    DRUG ALLERGIES:  No Known Allergies  VITALS:  Blood pressure (!) 93/54, pulse 94, temperature 98.3 F (36.8 C), temperature source Oral, resp. rate 16, height 5\' 2"  (1.575 m), weight 73.6 kg (162 lb 4.8 oz), SpO2 97 %.  PHYSICAL EXAMINATION:  Physical Exam  GENERAL:  70 y.o.-year-old patient lying in the bed with no acute distress.  EYES: Pupils equal, round, reactive to light and accommodation. No scleral icterus. Extraocular muscles intact.  HEENT: Head atraumatic, normocephalic. Oropharynx and nasopharynx clear.  NECK:  Supple, no jugular venous distention. No thyroid enlargement, no tenderness.  LUNGS: Normal breath sounds bilaterally, no wheezing, rales,rhonchi or crepitation. No use of accessory muscles of respiration. Decreased bibasilar breath sounds. CARDIOVASCULAR: S1, S2 normal. No murmurs,  rubs, or gallops.  ABDOMEN: Soft, nontender, nondistended. Bowel sounds present. No organomegaly or mass. Colostomy bag in place with semisolid stool EXTREMITIES: No cyanosis, or clubbing. 1+ pedal edema especially at the ankles NEUROLOGIC: Cranial nerves II through XII are intact. Muscle strength 5/5 in all extremities. Sensation intact. Gait not checked.  PSYCHIATRIC: The patient is alert and oriented x 3.  SKIN: No obvious rash, lesion, or ulcer.    LABORATORY PANEL:   CBC  Recent Labs Lab 04/28/16 0532  WBC 6.7  HGB 8.3*  HCT 24.3*  PLT 283   ------------------------------------------------------------------------------------------------------------------  Chemistries   Recent Labs Lab 04/29/16 0504  NA 138  K 2.7*  CL 105  CO2 22  GLUCOSE 102*  BUN 16  CREATININE 1.45*  CALCIUM 7.5*  MG 1.6*  AST 17  ALT 15  ALKPHOS 113  BILITOT 0.3   ------------------------------------------------------------------------------------------------------------------  Cardiac Enzymes No results for input(s): TROPONINI in the last 168 hours. ------------------------------------------------------------------------------------------------------------------  RADIOLOGY:  No results found.  EKG:   Orders placed or performed during the hospital encounter of 04/12/16  . EKG 12-Lead  . EKG 12-Lead  . EKG 12-Lead  . EKG 12-Lead  . EKG    ASSESSMENT AND PLAN:   70 year old female with past medical history significant for endometrial carcinoma currently receiving chemotherapy, chronic kidney disease, diverticulitis, colostomy with chronic diarrhea presents to hospital secondary to worsening lower extremity edema noted to be severely hypoalbumineic  #1 anasarca-secondary to hypoalbuminemia -Continue IV albumin daily. Albumin level improved to 2.5 -Improving swelling now. -Appreciate nephrology consult. Lasix changed to oral dose. Also add TED stockings -Severe malnourishment  and increased GI output from colostomy bag. -If the only option for her hypoalbuminemia is IV albumin, we'll check with oncology  About  PICC line and home albumin infusions as long as diarrhea persists  #2 chronic diarrhea-with high output through colostomy. Secondary to chemotherapy. -Appreciate GI consult. Colonoscopy with polyp that was sent for biopsy. -Restarted octreotide short acting- patient already tried the long acting octreotide without any improvement -started rifaximin to treat for any bacterial overgrowth. Also nystatin recommended for fungal overgrowth by GI.   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 Follows at the cancer center. Management per oncology. Patient is post 2 cycles of chemotherapy so far  #5 thrombocytopenia- secondary to chemotherapy-stable at this time. Follow-up  #6 DVT prophylaxis-Ted's and SCDs  Last physical therapy evaluation recommended rehabilitation. Re-evaluate. Anticipate discharge in the next 1-2 days   All the records are reviewed and case discussed with Care Management/Social Workerr. Management plans discussed with the patient, family and they are in agreement.  CODE STATUS: Full code  TOTAL TIME TAKING CARE OF THIS PATIENT: 37 minutes.   POSSIBLE D/C IN 1-2 DAYS, DEPENDING ON CLINICAL CONDITION.   Gladstone Lighter M.D on 04/29/2016 at 8:48 AM  Between 7am to 6pm - Pager - [phone]  After 6pm go to [email or web address] - password [email or web address]  Office  [phone]  CC: Primary care physician; [name], MD

## 2016-04-30 ENCOUNTER — Encounter: Payer: Self-pay | Admitting: Gastroenterology

## 2016-04-30 DIAGNOSIS — R630 Anorexia: Secondary | ICD-10-CM

## 2016-04-30 DIAGNOSIS — E876 Hypokalemia: Secondary | ICD-10-CM

## 2016-04-30 DIAGNOSIS — R197 Diarrhea, unspecified: Secondary | ICD-10-CM

## 2016-04-30 DIAGNOSIS — Z79899 Other long term (current) drug therapy: Secondary | ICD-10-CM

## 2016-04-30 DIAGNOSIS — R601 Generalized edema: Secondary | ICD-10-CM

## 2016-04-30 DIAGNOSIS — G629 Polyneuropathy, unspecified: Secondary | ICD-10-CM

## 2016-04-30 DIAGNOSIS — C541 Malignant neoplasm of endometrium: Secondary | ICD-10-CM

## 2016-04-30 DIAGNOSIS — C779 Secondary and unspecified malignant neoplasm of lymph node, unspecified: Secondary | ICD-10-CM

## 2016-04-30 LAB — PHOSPHORUS: PHOSPHORUS: 2.6 mg/dL (ref 2.5–4.6)

## 2016-04-30 LAB — MAGNESIUM: Magnesium: 2.4 mg/dL (ref 1.7–2.4)

## 2016-04-30 LAB — BASIC METABOLIC PANEL
Anion gap: 5 (ref 5–15)
BUN: 19 mg/dL (ref 6–20)
CALCIUM: 7.6 mg/dL — AB (ref 8.9–10.3)
CO2: 26 mmol/L (ref 22–32)
CREATININE: 1.85 mg/dL — AB (ref 0.44–1.00)
Chloride: 105 mmol/L (ref 101–111)
GFR calc non Af Amer: 27 mL/min — ABNORMAL LOW (ref >60)
GFR, EST AFRICAN AMERICAN: 31 mL/min — AB (ref >60)
Glucose, Bld: 97 mg/dL (ref 65–99)
Potassium: 3.4 mmol/L — ABNORMAL LOW (ref 3.5–5.1)
SODIUM: 136 mmol/L (ref 135–145)

## 2016-04-30 MED ORDER — RISAQUAD PO CAPS
1.0000 | ORAL_CAPSULE | Freq: Two times a day (BID) | ORAL | Status: DC
  Administered 2016-04-30 – 2016-05-01 (×2): 1 via ORAL
  Filled 2016-04-30 (×2): qty 1

## 2016-04-30 MED ORDER — SPIRONOLACTONE 25 MG PO TABS
25.0000 mg | ORAL_TABLET | Freq: Every day | ORAL | Status: DC
  Administered 2016-04-30: 25 mg via ORAL
  Filled 2016-04-30: qty 1

## 2016-04-30 MED ORDER — POTASSIUM CHLORIDE 10 MEQ/100ML IV SOLN
10.0000 meq | INTRAVENOUS | Status: AC
  Administered 2016-04-30 (×4): 10 meq via INTRAVENOUS
  Filled 2016-04-30 (×4): qty 100

## 2016-04-30 MED ORDER — SODIUM CHLORIDE 0.9 % IV SOLN
INTRAVENOUS | Status: DC
  Administered 2016-04-30 – 2016-05-01 (×2): via INTRAVENOUS

## 2016-04-30 MED ORDER — SODIUM CHLORIDE 0.9 % IV BOLUS (SEPSIS)
500.0000 mL | Freq: Once | INTRAVENOUS | Status: AC
  Administered 2016-04-30: 500 mL via INTRAVENOUS

## 2016-04-30 NOTE — Anesthesia Postprocedure Evaluation (Signed)
Anesthesia Post Note  Patient: [name]  Procedure(s) Performed: Procedure(s) (LRB): COLONOSCOPY WITH PROPOFOL (N/A)  Patient location during evaluation: Endoscopy Anesthesia Type: General Level of consciousness: awake and alert Pain management: pain level controlled Vital Signs Assessment: post-procedure vital signs reviewed and stable Respiratory status: spontaneous breathing, nonlabored ventilation, respiratory function stable and patient connected to nasal cannula oxygen Cardiovascular status: blood pressure returned to baseline and stable Postop Assessment: no signs of nausea or vomiting Anesthetic complications: no    Last Vitals:  Vitals:   04/30/16 0500 04/30/16 0734  BP: (!) 60/43 (!) 76/42  Pulse: 90 87  Resp:    Temp: 36.7 C     Last Pain:  Vitals:   04/30/16 0844  TempSrc:   PainSc: 0-No pain                 Martha Clan

## 2016-04-30 NOTE — Progress Notes (Signed)
[name] at [place] NAME: [name]    MR#:  [id number]  DATE OF BIRTH:  [birth year]  SUBJECTIVE:  CHIEF COMPLAINT:  No chief complaint on file.  - continues to have worsened diarrhea. Patient wants to go home - BP low today  REVIEW OF SYSTEMS:  Review of Systems  Constitutional: Positive for malaise/fatigue. Negative for chills and fever.  HENT: Negative for ear discharge, ear pain and nosebleeds.   Eyes: Negative for blurred vision and double vision.  Respiratory: Negative for cough, shortness of breath and wheezing.   Cardiovascular: Positive for leg swelling. Negative for chest pain and palpitations.  Gastrointestinal: Positive for diarrhea. Negative for abdominal pain, constipation, nausea and vomiting.  Genitourinary: Negative for dysuria.  Musculoskeletal: Positive for myalgias.  Neurological: Negative for dizziness, sensory change, speech change, focal weakness, seizures and headaches.  Psychiatric/Behavioral: Negative for depression.    DRUG ALLERGIES:  No Known Allergies  VITALS:  Blood pressure (!) 76/42, pulse 87, temperature 98.1 F (36.7 C), temperature source Oral, resp. rate 20, height 5\' 2"  (1.575 m), weight 73.6 kg (162 lb 4.8 oz), SpO2 97 %.  PHYSICAL EXAMINATION:  Physical Exam  GENERAL:  70 y.o.-year-old patient lying in the bed with no acute distress.  EYES: Pupils equal, round, reactive to light and accommodation. No scleral icterus. Extraocular muscles intact.  HEENT: Head atraumatic, normocephalic. Oropharynx and nasopharynx clear.  NECK:  Supple, no jugular venous distention. No thyroid enlargement, no tenderness.  LUNGS: Normal breath sounds bilaterally, no wheezing, rales,rhonchi or crepitation. No use of accessory muscles of respiration. Decreased bibasilar breath sounds. CARDIOVASCULAR: S1, S2 normal. No murmurs, rubs, or gallops.  ABDOMEN: Soft, nontender, nondistended. Bowel sounds present.  No organomegaly or mass. Colostomy bag in place with semisolid stool EXTREMITIES: No cyanosis, or clubbing. 1+ pedal edema especially at the ankles NEUROLOGIC: Cranial nerves II through XII are intact. Muscle strength 5/5 in all extremities. Sensation intact. Gait not checked.  PSYCHIATRIC: The patient is alert and oriented x 3.  SKIN: No obvious rash, lesion, or ulcer.    LABORATORY PANEL:   CBC  Recent Labs Lab 04/28/16 0532  WBC 6.7  HGB 8.3*  HCT 24.3*  PLT 283   ------------------------------------------------------------------------------------------------------------------  Chemistries   Recent Labs Lab 04/29/16 0504  04/30/16 0549  NA 138  --  136  K 2.7*  < > 3.4*  CL 105  --  105  CO2 22  --  26  GLUCOSE 102*  --  97  BUN 16  --  19  CREATININE 1.45*  --  1.85*  CALCIUM 7.5*  --  7.6*  MG 1.6*  --  2.4  AST 17  --   --   ALT 15  --   --   ALKPHOS 113  --   --   BILITOT 0.3  --   --   < > = values in this interval not displayed. ------------------------------------------------------------------------------------------------------------------  Cardiac Enzymes No results for input(s): TROPONINI in the last 168 hours. ------------------------------------------------------------------------------------------------------------------  RADIOLOGY:  No results found.  EKG:   Orders placed or performed during the hospital encounter of 04/12/16  . EKG 12-Lead  . EKG 12-Lead  . EKG 12-Lead  . EKG 12-Lead  . EKG    ASSESSMENT AND PLAN:   70 year old female with past medical history significant for endometrial carcinoma currently receiving chemotherapy, chronic kidney disease, diverticulitis, colostomy with chronic diarrhea presents to hospital secondary to worsening lower extremity  edema noted to be severely hypoalbumineic  #1 anasarca-secondary to hypoalbuminemia -Continue IV albumin daily while inpatient. Albumin level improved to 2.5 -Improved swelling  now. -Appreciate nephrology consult. added TED stockings - discontinue lasix, started aldactone as low potassium -Severe malnourishment and increased GI output from colostomy bag. - no benefit of home albumin infusions - discussed with oncology  #2 chronic diarrhea-with high output through colostomy. Secondary to chemotherapy. -Appreciate GI consult. Colonoscopy with polyp that was sent for biopsy. -Restarted octreotide short acting- patient already tried the long acting octreotide without any improvement -on rifaximin to treat for any bacterial overgrowth. 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 Follows at the cancer center. Management per oncology. Patient is post 2 cycles of chemotherapy so far  #5 thrombocytopenia- secondary to chemotherapy-stable at this time. Follow-up  #6 DVT prophylaxis-Ted's and SCDs  Last physical therapy evaluation recommended rehabilitation. Re-evaluate. Anticipate discharge in the next 1-2 days   All the records are reviewed and case discussed with Care Management/Social Workerr. Management plans discussed with the patient, family and they are in agreement.  CODE STATUS: Full code  TOTAL TIME TAKING CARE OF THIS PATIENT: 37 minutes.   POSSIBLE D/C IN 1-2 DAYS, DEPENDING ON CLINICAL CONDITION.   [name] M.D on 04/30/2016 at 12:26 PM  Between 7am to 6pm - Pager - [phone]  After 6pm go to [email or web address] - password [email or web address]  Office  [phone]  CC: Primary care physician; Heather Miu, MD

## 2016-04-30 NOTE — Progress Notes (Signed)
Subjective:   Continues to have significant ostomy output. 1800 cc reported last 24 hours Potassium remains low at 3.4 Serum creatinine slightly high at 1.85 No c/o  shortness of breath Phosphorus and magnesium levels are normal   Objective:  Vital signs in last 24 hours:  Temp:  [98.1 F (36.7 C)-98.5 F (36.9 C)] 98.1 F (36.7 C) (08/28 0500) Pulse Rate:  [87-107] 87 (08/28 0734) Resp:  [20] 20 (08/27 1349) BP: (55-99)/(40-59) 76/42 (08/28 0734) SpO2:  [94 %-97 %] 97 % (08/28 0734)  Weight change:  Filed Weights   04/25/16 1518  Weight: 73.6 kg (162 lb 4.8 oz)    Intake/Output:    Intake/Output Summary (Last 24 hours) at 04/30/16 1043 Last data filed at 04/30/16 0147  Gross per 24 hour  Intake                0 ml  Output             2265 ml  Net            -2265 ml     Physical Exam: General: No acute distress, laying in the bed   HEENT Moist oral mucous membranes   Neck Supple   Pulm/lungs Clear   CVS/Heart S1S2 no rubs  Abdomen:  Soft, nontender, nondistended, colostomy in place   Extremities: No edema   Neurologic: Alert, oriented   Skin: Scaly lesions bilateral legs           Basic Metabolic Panel:   Recent Labs Lab 04/25/16 1841 04/26/16 0455 04/27/16 0441 04/28/16 0532 04/29/16 0504 04/29/16 1824 04/30/16 0549  NA 138 139  --  139 138  --  136  K 3.3* 3.7 3.1* 3.2* 2.7* 3.0* 3.4*  CL 104 104  --  104 105  --  105  CO2 27 28  --  25 22  --  26  GLUCOSE 154* 103*  --  111* 102*  --  97  BUN 24* 24*  --  15 16  --  19  CREATININE 1.40* 1.49*  --  1.27* 1.45*  --  1.85*  CALCIUM 7.1* 7.3*  --  7.4* 7.5*  --  7.6*  MG 1.6* 2.0 1.7 2.1 1.6*  --  2.4  PHOS  --  2.2* 2.2* 3.2 3.0  --  2.6     CBC:  Recent Labs Lab 04/28/16 0532  WBC 6.7  HGB 8.3*  HCT 24.3*  MCV 94.7  PLT 283      Microbiology:  No results found for this or any previous visit (from the past 720 hour(s)).  Coagulation Studies: No results for input(s):  LABPROT, INR in the last 72 hours.  Urinalysis: No results for input(s): COLORURINE, LABSPEC, PHURINE, GLUCOSEU, HGBUR, BILIRUBINUR, KETONESUR, PROTEINUR, UROBILINOGEN, NITRITE, LEUKOCYTESUR in the last 72 hours.  Invalid input(s): APPERANCEUR    Imaging: No results found.   Medications:     . acetaminophen-codeine  1 tablet Oral TID  . acidophilus  1 capsule Oral BID  . calcium carbonate  400 mg of elemental calcium Oral TID WC  . feeding supplement (ENSURE ENLIVE)  1 Bottle Oral Q24H  . octreotide  100 mcg Subcutaneous Q8H  . potassium chloride  10 mEq Intravenous Q1 Hr x 4  . rifaximin  550 mg Oral TID   acetaminophen **OR** acetaminophen, LORazepam, ondansetron, promethazine  Assessment/ Plan:  70 y.o.white female with recurrent stage III (high grade metastatic adenocarcinoma) endometrial cancer, chemo with carbo platinum  and Taxol,  history of diverticulitis status post colostomy,   1.  Acute renal failure on chronic kidney disease stage III: creatinine fluctuates with fluid status.  Recently increased likely secondary to increased ostomy output  2.  Hypokalemia: on PO and IV replacement - discontinued sodium bicarbonate.  - Trial of spironolactone            LOS: 5 Keighan Amezcua 8/28/201710:43 AM

## 2016-04-30 NOTE — Progress Notes (Signed)
  [name], MD [facility]   [address], [place], [place] [address] Phone: [phone] Fax : [phone]   Subjective: The patient reports that she is having less diarrhea despite all indications at that is not true.  The patient reports that she just wants to go home.  She had a colonoscopy at the end of last week and we are awaiting the biopsy results.  The patient was seen by Dr. Vira Agar who is covering this weekend who suggested the patient go on narcotics to help slow down the patient's bowel movements.   Objective: Vital signs in last 24 hours: Vitals:   04/29/16 1944 04/30/16 0500 04/30/16 0734 04/30/16 1339  BP: (!) 78/49 (!) 60/43 (!) 76/42 (!) 90/35  Pulse: 97 90 87 97  Resp:    20  Temp: 98.5 F (36.9 C) 98.1 F (36.7 C)  99.5 F (37.5 C)  TempSrc: Oral Oral  Oral  SpO2: 97% 94% 97% 95%  Weight:      Height:       Weight change:   Intake/Output Summary (Last 24 hours) at 04/30/16 1944 Last data filed at 04/30/16 0147  Gross per 24 hour  Intake                0 ml  Output             1200 ml  Net            -1200 ml     Exam: Heart:: Regular rate and rhythm Lungs: normal Abdomen: soft, nontender, normal bowel sounds   Lab Results: @LABTEST2 @ Micro Results: No results found for this or any previous visit (from the past 240 hour(s)). Studies/Results: No results found. Medications: I have reviewed the patient's current medications. Scheduled Meds: . acetaminophen-codeine  1 tablet Oral TID  . acidophilus  1 capsule Oral BID  . calcium carbonate  400 mg of elemental calcium Oral TID WC  . feeding supplement (ENSURE ENLIVE)  1 Bottle Oral Q24H  . octreotide  100 mcg Subcutaneous Q8H  . rifaximin  550 mg Oral TID  . spironolactone  25 mg Oral Daily   Continuous Infusions: . sodium chloride 75 mL/hr at 04/30/16 1343   PRN Meds:.acetaminophen **OR** acetaminophen, LORazepam, ondansetron, promethazine   Assessment: Active Problems:  Anasarca   Hypocalcemia   Benign neoplasm of transverse colon   Chronic diarrhea of unknown origin    Plan: This patient has chronic diarrhea with a colonoscopy be done at the end of last week and the biopsies are still pending.  The patient states that she is going home tomorrow.  The patient will be treated according to the results of her pathology.  The patient has been explained the plan. I have also discussed the case with her oncologist and her hospitalist.   LOS: 5 days   [name] 04/30/2016, 7:44 PM

## 2016-04-30 NOTE — Care Management Important Message (Signed)
Important Message  Patient Details  Name: TERAH GIAIMO MRN: [id number] Date of Birth: Jul 29, 1946   Medicare Important Message Given:  Yes    [name], RN 04/30/2016, 10:44 AM

## 2016-04-30 NOTE — Progress Notes (Signed)
Pt refusing nystatin and Klor-con. Dorna Bloom RN

## 2016-04-30 NOTE — Progress Notes (Signed)
PHARMACY CONSULT NOTE -follow up  Pharmacy Consult for Electrolyte management   No Known Allergies  Patient Measurements: Height: 5\' 2"  (157.5 cm) Weight: 162 lb 4.8 oz (73.6 kg) IBW/kg (Calculated) : 50.1  Vital Signs: Temp: 98.1 F (36.7 C) (08/28 0500) Temp Source: Oral (08/28 0500) BP: 76/42 (08/28 0734) Pulse Rate: 87 (08/28 0734) Intake/Output from previous day: 08/27 0701 - 08/28 0700 In: 240 [P.O.:240] Out: 2265 [Urine:475; Stool:1790] Intake/Output from this shift: No intake/output data recorded.  Labs:   Recent Labs  04/28/16 0532 04/29/16 0504 04/29/16 1824 04/30/16 0549  NA 139 138  --  136  K 3.2* 2.7* 3.0* 3.4*  CL 104 105  --  105  CO2 25 22  --  26  GLUCOSE 111* 102*  --  97  BUN 15 16  --  19  CREATININE 1.27* 1.45*  --  1.85*  CALCIUM 7.4* 7.5*  --  7.6*  MG 2.1 1.6*  --  2.4  PHOS 3.2 3.0  --  2.6  PROT  --  4.5*  --   --   ALBUMIN  --  2.5*  --   --   AST  --  17  --   --   ALT  --  15  --   --   ALKPHOS  --  113  --   --   BILITOT  --  0.3  --   --    Estimated Creatinine Clearance: 27 mL/min (by C-G formula based on SCr of 1.85 mg/dL).   No results for input(s): GLUCAP in the last 72 hours.  Medical History: Past Medical History:  Diagnosis Date  . Cancer [facility]) 2011   endometrial  . Chronic kidney disease    STONES  . Diverticulitis of colon (without mention of hemorrhage)   . History of kidney stones 2011  . Mechanical complication of colostomy and enterostomy   . Obesity, unspecified   . Personal history of tobacco use, presenting hazards to health    Assessment: 70 yo female admitted with anasarca, profuse diarrhea. Pt. Has colostomy. Pharmacy has been consulted for electrolyte management.   Patient is on KCL 40 MEQ po BID and furosemide 10mg  po bid.  Also on Calcium (Tums) 2 tabs TID and Sodium Bicarb 1300mg  bid.     8/27: K: 3.4; All other lytes normal.    Plan:  Will continue currently repletion. MD order KCl 10  mEq IV x 4.     Haytham Maher D 04/30/2016,8:47 AM

## 2016-05-01 ENCOUNTER — Other Ambulatory Visit: Payer: Self-pay | Admitting: *Deleted

## 2016-05-01 DIAGNOSIS — C541 Malignant neoplasm of endometrium: Secondary | ICD-10-CM

## 2016-05-01 LAB — MAGNESIUM: Magnesium: 1.9 mg/dL (ref 1.7–2.4)

## 2016-05-01 LAB — BASIC METABOLIC PANEL
Anion gap: 5 (ref 5–15)
BUN: 21 mg/dL — ABNORMAL HIGH (ref 6–20)
CALCIUM: 7.4 mg/dL — AB (ref 8.9–10.3)
CHLORIDE: 106 mmol/L (ref 101–111)
CO2: 24 mmol/L (ref 22–32)
Creatinine, Ser: 2.04 mg/dL — ABNORMAL HIGH (ref 0.44–1.00)
GFR calc Af Amer: 27 mL/min — ABNORMAL LOW (ref >60)
GFR, EST NON AFRICAN AMERICAN: 24 mL/min — AB (ref >60)
GLUCOSE: 90 mg/dL (ref 65–99)
Potassium: 4.1 mmol/L (ref 3.5–5.1)
Sodium: 135 mmol/L (ref 135–145)

## 2016-05-01 LAB — SURGICAL PATHOLOGY

## 2016-05-01 MED ORDER — POTASSIUM CHLORIDE 20 MEQ PO PACK: 20.0000 meq | PACK | Freq: Two times a day (BID) | ORAL | 1 refills | Status: DC

## 2016-05-01 MED ORDER — ACETAMINOPHEN-CODEINE #3 300-30 MG PO TABS: 1.0000 | ORAL_TABLET | Freq: Three times a day (TID) | ORAL | 0 refills | Status: DC

## 2016-05-01 MED ORDER — RISAQUAD PO CAPS: 1.0000 | ORAL_CAPSULE | Freq: Two times a day (BID) | ORAL | 2 refills | Status: AC

## 2016-05-01 MED ORDER — RIFAXIMIN 550 MG PO TABS: 550.0000 mg | ORAL_TABLET | Freq: Three times a day (TID) | ORAL | 0 refills | Status: DC

## 2016-05-01 MED ORDER — HEPARIN SOD (PORK) LOCK FLUSH 100 UNIT/ML IV SOLN
500.0000 [IU] | Freq: Once | INTRAVENOUS | Status: AC
  Administered 2016-05-01: 500 [IU] via INTRAVENOUS
  Filled 2016-05-01: qty 5

## 2016-05-01 NOTE — Care Management (Signed)
Discharge to home today per Dr. Tressia Miners. Home Health face-to-face information faxed to [facility]. Family will transport Heather Ammons RN MSN CCM Care Management [phone]

## 2016-05-01 NOTE — Plan of Care (Addendum)
Problem: Fluid Volume: Goal: Ability to maintain a balanced intake and output will improve Outcome: Progressing Colostomy bag emptied x 3 so far this shift. Pt nauseated earlier with mild emesis, improved after administration of Zofran x 1. Refused po meds at bedtime. Pt said she was feeling better and did not want anything to mess it up.

## 2016-05-01 NOTE — Progress Notes (Signed)
Subjective:   Continues to have significant ostomy output. 1700 cc reported last 24 hours Potassium level has improved at 4.1 Serum creatinine slightly high at 2.04 No c/o  shortness of breath Phosphorus and magnesium levels are normal Patient states she is going home today  Objective:  Vital signs in last 24 hours:  Temp:  [98.1 F (36.7 C)-99.5 F (37.5 C)] 98.1 F (36.7 C) (08/29 0444) Pulse Rate:  [89-112] 89 (08/29 0756) Resp:  [18-20] 20 (08/29 0756) BP: (81-90)/(35-58) 85/55 (08/29 0756) SpO2:  [93 %-95 %] 93 % (08/29 0444)  Weight change:  Filed Weights   04/25/16 1518  Weight: 73.6 kg (162 lb 4.8 oz)    Intake/Output:    Intake/Output Summary (Last 24 hours) at 05/01/16 1017 Last data filed at 05/01/16 0523  Gross per 24 hour  Intake           1027.5 ml  Output             1700 ml  Net           -672.5 ml     Physical Exam: General: No acute distress, laying in the bed   HEENT Moist oral mucous membranes   Neck Supple   Pulm/lungs Clear   CVS/Heart S1S2 no rubs  Abdomen:  Soft, nontender, nondistended, colostomy in place   Extremities: No edema   Neurologic: Alert, oriented   Skin: Scaly lesions bilateral legs           Basic Metabolic Panel:   Recent Labs Lab 04/26/16 0455 04/27/16 0441 04/28/16 0532 04/29/16 0504 04/29/16 1824 04/30/16 0549 05/01/16 0547  NA 139  --  139 138  --  136 135  K 3.7 3.1* 3.2* 2.7* 3.0* 3.4* 4.1  CL 104  --  104 105  --  105 106  CO2 28  --  25 22  --  26 24  GLUCOSE 103*  --  111* 102*  --  97 90  BUN 24*  --  15 16  --  19 21*  CREATININE 1.49*  --  1.27* 1.45*  --  1.85* 2.04*  CALCIUM 7.3*  --  7.4* 7.5*  --  7.6* 7.4*  MG 2.0 1.7 2.1 1.6*  --  2.4 1.9  PHOS 2.2* 2.2* 3.2 3.0  --  2.6  --      CBC:  Recent Labs Lab 04/28/16 0532  WBC 6.7  HGB 8.3*  HCT 24.3*  MCV 94.7  PLT 283      Microbiology:  No results found for this or any previous visit (from the past 720  hour(s)).  Coagulation Studies: No results for input(s): LABPROT, INR in the last 72 hours.  Urinalysis: No results for input(s): COLORURINE, LABSPEC, PHURINE, GLUCOSEU, HGBUR, BILIRUBINUR, KETONESUR, PROTEINUR, UROBILINOGEN, NITRITE, LEUKOCYTESUR in the last 72 hours.  Invalid input(s): APPERANCEUR    Imaging: No results found.   Medications:   . sodium chloride 75 mL/hr at 05/01/16 0234   . acetaminophen-codeine  1 tablet Oral TID  . acidophilus  1 capsule Oral BID  . calcium carbonate  400 mg of elemental calcium Oral TID WC  . feeding supplement (ENSURE ENLIVE)  1 Bottle Oral Q24H  . octreotide  100 mcg Subcutaneous Q8H  . rifaximin  550 mg Oral TID  . spironolactone  25 mg Oral Daily   acetaminophen **OR** acetaminophen, LORazepam, ondansetron, promethazine  Assessment/ Plan:  70 y.o.white female with recurrent stage III (high grade metastatic adenocarcinoma) endometrial  cancer, chemo with carbo platinum and Taxol,  history of diverticulitis status post colostomy,   1.  Acute renal failure on chronic kidney disease stage III: creatinine fluctuates with fluid status.  Creatinine has recently increased likely secondary to increased ostomy output Patient states she is going home today Will f/u outpatient Mon, 9/11 at 2.20 PM  2.  Hypokalemia: level is normal now - continue spironolactone            LOS: 6 Ronell Duffus 8/29/201710:17 AM

## 2016-05-01 NOTE — Progress Notes (Signed)
[facility] Hematology/Oncology Progress Note  Date of admission: 04/25/2016  Hospital day:  04/30/2016  Chief Complaint: [name] is a 70 y.o. female with recurrent stage III endometrial cancer who was admitted with anasarca.  Subjective:  Patient feeling much better.  She feels that her stool output is manageable.  She has improvement in her mobility since diuresis.  She states that she is going home tomorrow.  Social History: The patient is alone today.  Allergies: No Known Allergies  Scheduled Medications: . acetaminophen-codeine  1 tablet Oral TID  . acidophilus  1 capsule Oral BID  . calcium carbonate  400 mg of elemental calcium Oral TID WC  . feeding supplement (ENSURE ENLIVE)  1 Bottle Oral Q24H  . octreotide  100 mcg Subcutaneous Q8H  . rifaximin  550 mg Oral TID  . spironolactone  25 mg Oral Daily    Review of Systems: GENERAL:  Feels better.  Fatigue.  No fevers or sweats.  Weight loss secondary to fluid mobilization. PERFORMANCE STATUS (ECOG):  2-3 HEENT:  No visual changes, runny nose, sore throat, mouth sores or tenderness. Lungs: No shortness of breath or cough.  No hemoptysis. Cardiac:  No chest pain, palpitations, orthopnea, or PND. GI:  Ostomy output  requiring bag change evert 2 hours.  No nausea, vomiting, constipation, melena or hematochezia. GU:  No urgency, frequency, dysuria, or hematuria. Musculoskeletal:  No back pain.  No joint pain.  No muscle tenderness. Extremities:  Resolution of lower extremity swelling. Skin:  No rashes or skin changes. Neuro:  General weakness.  No headache, numbness or weakness, balance or coordination issues. Endocrine:  No diabetes, thyroid issues, hot flashes or night sweats. Psych: Mood improved. Pain:  Pain associated with leg edema resolved.. Review of systems:  All other systems reviewed and found to be negative.  Physical Exam: Blood pressure (!) 84/58, pulse (!) 112, temperature 98.4  F (36.9 C), temperature source Oral, resp. rate 18, height 5' 2"  (1.575 m), weight 162 lb 4.8 oz (73.6 kg), SpO2 93 %.  GENERAL:  Chronically ill appearing woman sitting comfortably on the medical unit eating lunch in no acute distress. MENTAL STATUS:  Alert and oriented to person, place and time. HEAD:  Short gray hair.  Normocephalic, atraumatic, face symmetric, no Cushingoid features. EYES:  Blue eyes.  Pupils equal round and reactive to light and accomodation.  No conjunctivitis or scleral icterus. ENT:  Oropharynx clear without lesion.  Tongue normal. Mucous membranes moist.  RESPIRATORY:  Clear to auscultation without rales, wheezes or rhonchi. CARDIOVASCULAR:  Regular rate and rhythm without murmur, rub or gallop. ABDOMEN:  Colostomy with green stool.  Soft, non-tender, with active bowel sounds, and no hepatosplenomegaly.  No masses. SKIN:  No rashes, ulcers or lesions. EXTREMITIES: Resolution of edema. No skin discoloration or tenderness.  No palpable cords. NEUROLOGICAL: Unremarkable. PSYCH:  Appropriate.  Results for orders placed or performed during the hospital encounter of 04/25/16 (from the past 48 hour(s))  Magnesium     Status: Abnormal   Collection Time: 04/29/16  5:04 AM  Result Value Ref Range   Magnesium 1.6 (L) 1.7 - 2.4 mg/dL  Phosphorus     Status: None   Collection Time: 04/29/16  5:04 AM  Result Value Ref Range   Phosphorus 3.0 2.5 - 4.6 mg/dL  Comprehensive metabolic panel     Status: Abnormal   Collection Time: 04/29/16  5:04 AM  Result Value Ref Range   Sodium 138 135 -  145 mmol/L   Potassium 2.7 (LL) 3.5 - 5.1 mmol/L    Comment: CRITICAL RESULT CALLED TO, READ BACK BY AND VERIFIED WITH [facility] AT 4259 04/29/16.PMH   Chloride 105 101 - 111 mmol/L   CO2 22 22 - 32 mmol/L   Glucose, Bld 102 (H) 65 - 99 mg/dL   BUN 16 6 - 20 mg/dL   Creatinine, Ser 1.45 (H) 0.44 - 1.00 mg/dL   Calcium 7.5 (L) 8.9 - 10.3 mg/dL   Total Protein 4.5 (L) 6.5 - 8.1 g/dL    Albumin 2.5 (L) 3.5 - 5.0 g/dL   AST 17 15 - 41 U/L   ALT 15 14 - 54 U/L   Alkaline Phosphatase 113 38 - 126 U/L   Total Bilirubin 0.3 0.3 - 1.2 mg/dL   GFR calc non Af Amer 36 (L) >60 mL/min   GFR calc Af Amer 42 (L) >60 mL/min    Comment: (NOTE) The eGFR has been calculated using the CKD EPI equation. This calculation has not been validated in all clinical situations. eGFR's persistently <60 mL/min signify possible Chronic Kidney Disease.    Anion gap 11 5 - 15  Potassium     Status: Abnormal   Collection Time: 04/29/16  6:24 PM  Result Value Ref Range   Potassium 3.0 (L) 3.5 - 5.1 mmol/L  Basic metabolic panel     Status: Abnormal   Collection Time: 04/30/16  5:49 AM  Result Value Ref Range   Sodium 136 135 - 145 mmol/L   Potassium 3.4 (L) 3.5 - 5.1 mmol/L   Chloride 105 101 - 111 mmol/L   CO2 26 22 - 32 mmol/L   Glucose, Bld 97 65 - 99 mg/dL   BUN 19 6 - 20 mg/dL   Creatinine, Ser 1.85 (H) 0.44 - 1.00 mg/dL   Calcium 7.6 (L) 8.9 - 10.3 mg/dL   GFR calc non Af Amer 27 (L) >60 mL/min   GFR calc Af Amer 31 (L) >60 mL/min    Comment: (NOTE) The eGFR has been calculated using the CKD EPI equation. This calculation has not been validated in all clinical situations. eGFR's persistently <60 mL/min signify possible Chronic Kidney Disease.    Anion gap 5 5 - 15  Magnesium     Status: None   Collection Time: 04/30/16  5:49 AM  Result Value Ref Range   Magnesium 2.4 1.7 - 2.4 mg/dL  Phosphorus     Status: None   Collection Time: 04/30/16  5:49 AM  Result Value Ref Range   Phosphorus 2.6 2.5 - 4.6 mg/dL   No results found.  Assessment:  [name] is a 70 y.o. female with recurrent stage III endometrial cancer s/p 2 cycles of carboplatin and Taxol (last 04/05/2016).   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    She was admitted on 04/25/2016 with profound debilitation, poor  nutrition (albumen 1.4) secondary to ongoing diarrhea and poor oral intake, and anasarca.  Her anasarca has resolved with albumen and Lasix.    She underwent colonoscopy on 04/27/2016.  Biopsies are pending.  She is on octreotide and rifaximin.  Plan: 1.  Oncology:  Recurrent stage III endometrial cancer.  No current plan for reinstitution of chemotherapy until performance status improves and diarrhea well controlled.  2.  Gastroenterology:  Await biopsies from colonoscopy.  Continue short acting octreotide.  Rifaximin for possible bacterial overgrowth.  Enterogam supplement not on inpatient formulary.  We are working on providing samples for patient in the outpatient department.  3.  Disposition:  Patient adamant about going home tomorrow with home health and physical therapy rather than skilled nursing.  Will follow-up in outpatient department at end of week (before long holiday weekend).   Lequita Asal, MD  04/30/2016

## 2016-05-01 NOTE — Discharge Summary (Signed)
[facility] [place] NAME: [name]    MR#:  [id number]  DATE OF BIRTH:  [birth year]  DATE OF ADMISSION:  04/25/2016   ADMITTING PHYSICIAN: Fritzi Mandes, MD  DATE OF DISCHARGE: 05/01/2016 11:09 AM  PRIMARY CARE PHYSICIAN: [name], MD   ADMISSION DIAGNOSIS:   anasarca electrolyte imbalance Diarrhea  DISCHARGE DIAGNOSIS:   Active Problems:   Anasarca   Hypocalcemia   Benign neoplasm of transverse colon   Chronic diarrhea of unknown origin   SECONDARY DIAGNOSIS:   Past Medical History:  Diagnosis Date  . Cancer [facility]) 2011   endometrial  . Chronic kidney disease    STONES  . Diverticulitis of colon (without mention of hemorrhage)   . History of kidney stones 2011  . Mechanical complication of colostomy and enterostomy   . Obesity, unspecified   . Personal history of tobacco use, presenting hazards to health     HOSPITAL COURSE:   70 year old female with past medical history significant for endometrial carcinoma currently receiving chemotherapy, chronic kidney disease, diverticulitis, colostomy with chronic diarrhea presents to hospital secondary to worsening lower extremity edema noted to be severely hypoalbumineic  #1 anasarca-secondary to hypoalbuminemia -received IV albumin daily while inpatient. Albumin level improved to 2.5 -Improved swelling now. -Appreciate nephrology consult. added TED stockings - discontinued lasix and aldactone due to hypotension -Severe malnourishment and increased GI output from colostomy bag. - no benefit of home albumin infusions - discussed with oncology - since edema improved and colostomy output close to baseline- patient very anxious to go home. Being discharged, guarded prognosis and high risk for readmission  #2 chronic diarrhea-with high output through colostomy. Secondary to chemotherapy. -Appreciate GI consult. Colonoscopy with polyp that was sent for biopsy. -  continue SQ octreotide short acting- patient already tried the long acting octreotide without any improvement -on rifaximin to treat for any bacterial overgrowth.   - outpatient GI follow up - TID codeine added by GI  #3 Hypotension- hypovolemia- due to fluid losses,Improved with fluids - baseline low normal BP- asymptomatic  #3 hypokalemia-replacing appropriately  #4 endometrial cancer-recurrent stage III endometrial cancer on chemotherapy. Follows at the cancer center. Management per oncology. Patient is post 2 cycles of chemotherapy so far  #5 thrombocytopenia- secondary to chemotherapy-stable at this time. Follow-up  Anxious to be discharged, close to baseline. Being discharged High risk for re-admission  DISCHARGE CONDITIONS:   Guarded  CONSULTS OBTAINED:   Treatment Team:  [name], MD Lavonia Dana, MD  DRUG ALLERGIES:   No Known Allergies DISCHARGE MEDICATIONS:     Medication List    STOP taking these medications   sodium bicarbonate 650 MG tablet   Syringe (Disposable) 1 ML Misc     TAKE these medications   acetaminophen-codeine 300-30 MG tablet Commonly known as:  TYLENOL #3 Take 1 tablet by mouth 3 (three) times daily.   acidophilus Caps capsule Take 1 capsule by mouth 2 (two) times daily.   calcium carbonate 500 MG chewable tablet Commonly known as:  TUMS - dosed in mg elemental calcium Chew 2 tablets (400 mg of elemental calcium total) by mouth 3 (three) times daily with meals.   feeding supplement Liqd Take 1 Container by mouth 3 (three) times daily between meals.   LORazepam 0.5 MG tablet Commonly known as:  ATIVAN Take 1 tablet (0.5 mg total) by mouth every 8 (eight) hours as needed for anxiety.   magnesium oxide 400 (241.3 Mg) MG  tablet Commonly known as:  MAG-OX Take 1 tablet (400 mg total) by mouth 2 (two) times daily.   octreotide 100 MCG/ML Soln injection Commonly known as:  SANDOSTATIN Inject 1 mL (100 mcg total) into the  skin every 8 (eight) hours.   ondansetron 8 MG tablet Commonly known as:  ZOFRAN Take 8 mg by mouth 2 (two) times daily as needed.   potassium chloride 20 MEQ packet Commonly known as:  KLOR-CON Take 20 mEq by mouth 2 (two) times daily. What changed:  how much to take   promethazine 25 MG tablet Commonly known as:  PHENERGAN Take 1 tablet (25 mg total) by mouth every 8 (eight) hours as needed for nausea or vomiting.   rifaximin 550 MG Tabs tablet Commonly known as:  XIFAXAN Take 1 tablet (550 mg total) by mouth 3 (three) times daily. X 12 more days        DISCHARGE INSTRUCTIONS:   1. Oncology f/u in 1 week 2. GI f/u in 1-2 weeks 3. PCP f/u in 2 weeks  DIET:   Regular diet  ACTIVITY:   Activity as tolerated  OXYGEN:   Home Oxygen: No.  Oxygen Delivery: room air  DISCHARGE LOCATION:   home   If you experience worsening of your admission symptoms, develop shortness of breath, life threatening emergency, suicidal or homicidal thoughts you must seek medical attention immediately by calling 911 or calling your MD immediately  if symptoms less severe.  You Must read complete instructions/literature along with all the possible adverse reactions/side effects for all the Medicines you take and that have been prescribed to you. Take any new Medicines after you have completely understood and accpet all the possible adverse reactions/side effects.   Please note  You were cared for by a hospitalist during your hospital stay. If you have any questions about your discharge medications or the care you received while you were in the hospital after you are discharged, you can call the unit and asked to speak with the hospitalist on call if the hospitalist that took care of you is not available. Once you are discharged, your primary care physician will handle any further medical issues. Please note that NO REFILLS for any discharge medications will be authorized once you are  discharged, as it is imperative that you return to your primary care physician (or establish a relationship with a primary care physician if you do not have one) for your aftercare needs so that they can reassess your need for medications and monitor your lab values.    On the day of Discharge:  VITAL SIGNS:   Blood pressure (!) 85/55, pulse 89, temperature 98.1 F (36.7 C), temperature source Oral, resp. rate 20, height 5\' 2"  (1.575 m), weight 73.6 kg (162 lb 4.8 oz), SpO2 93 %.  PHYSICAL EXAMINATION:    GENERAL:  70 y.o.-year-old patient lying in the bed with no acute distress.  EYES: Pupils equal, round, reactive to light and accommodation. No scleral icterus. Extraocular muscles intact.  HEENT: Head atraumatic, normocephalic. Oropharynx and nasopharynx clear.  NECK:  Supple, no jugular venous distention. No thyroid enlargement, no tenderness.  LUNGS: Normal breath sounds bilaterally, no wheezing, rales,rhonchi or crepitation. No use of accessory muscles of respiration. Decreased bibasilar breath sounds. CARDIOVASCULAR: S1, S2 normal. No murmurs, rubs, or gallops.  ABDOMEN: Soft, nontender, nondistended. Bowel sounds present. No organomegaly or mass. Colostomy bag in place with semisolid stool EXTREMITIES: No cyanosis, or clubbing. 1+ pedal edema especially at the ankles  NEUROLOGIC: Cranial nerves II through XII are intact. Muscle strength 5/5 in all extremities. Sensation intact. Gait not checked.  PSYCHIATRIC: The patient is alert and oriented x 3.  SKIN: No obvious rash, lesion, or ulcer.   DATA REVIEW:   CBC  Recent Labs Lab 04/28/16 0532  WBC 6.7  HGB 8.3*  HCT 24.3*  PLT 283    Chemistries   Recent Labs Lab 04/29/16 0504  05/01/16 0547  NA 138  < > 135  K 2.7*  < > 4.1  CL 105  < > 106  CO2 22  < > 24  GLUCOSE 102*  < > 90  BUN 16  < > 21*  CREATININE 1.45*  < > 2.04*  CALCIUM 7.5*  < > 7.4*  MG 1.6*  < > 1.9  AST 17  --   --   ALT 15  --   --   ALKPHOS  113  --   --   BILITOT 0.3  --   --   < > = values in this interval not displayed.   Microbiology Results  Results for orders placed or performed during the hospital encounter of 03/25/16  C difficile quick scan w PCR reflex     Status: None   Collection Time: 03/25/16  4:53 PM  Result Value Ref Range Status   C Diff antigen NEGATIVE NEGATIVE Final   C Diff toxin NEGATIVE NEGATIVE Final   C Diff interpretation No C. difficile detected.  Final  Urine culture     Status: Abnormal   Collection Time: 03/26/16  2:28 PM  Result Value Ref Range Status   Specimen Description URINE, CATHETERIZED  Final   Special Requests NONE  Final   Culture MULTIPLE SPECIES PRESENT, SUGGEST RECOLLECTION (A)  Final   Report Status 03/27/2016 FINAL  Final  Gastrointestinal Panel by PCR , Stool     Status: None   Collection Time: 03/26/16  4:47 PM  Result Value Ref Range Status   Campylobacter species NOT DETECTED NOT DETECTED Final   Plesimonas shigelloides NOT DETECTED NOT DETECTED Final   Salmonella species NOT DETECTED NOT DETECTED Final   Yersinia enterocolitica NOT DETECTED NOT DETECTED Final   Vibrio species NOT DETECTED NOT DETECTED Final   Vibrio cholerae NOT DETECTED NOT DETECTED Final   Enteroaggregative E coli (EAEC) NOT DETECTED NOT DETECTED Final   Enteropathogenic E coli (EPEC) NOT DETECTED NOT DETECTED Final   Enterotoxigenic E coli (ETEC) NOT DETECTED NOT DETECTED Final   Shiga like toxin producing E coli (STEC) NOT DETECTED NOT DETECTED Final   E. coli O157 NOT DETECTED NOT DETECTED Final   Shigella/Enteroinvasive E coli (EIEC) NOT DETECTED NOT DETECTED Final   Cryptosporidium NOT DETECTED NOT DETECTED Final   Cyclospora cayetanensis NOT DETECTED NOT DETECTED Final   Entamoeba histolytica NOT DETECTED NOT DETECTED Final   Giardia lamblia NOT DETECTED NOT DETECTED Final   Adenovirus F40/41 NOT DETECTED NOT DETECTED Final   Astrovirus NOT DETECTED NOT DETECTED Final   Norovirus GI/GII  NOT DETECTED NOT DETECTED Final   Rotavirus A NOT DETECTED NOT DETECTED Final   Sapovirus (I, II, IV, and V) NOT DETECTED NOT DETECTED Final  Urine culture     Status: None   Collection Time: 03/28/16  8:52 AM  Result Value Ref Range Status   Specimen Description URINE, CATHETERIZED  Final   Special Requests NONE  Final   Culture NO GROWTH Performed at [facility]   Final  Report Status 03/29/2016 FINAL  Final    RADIOLOGY:  No results found.   Management plans discussed with the patient, family and they are in agreement.  CODE STATUS:  Code Status History    Date Active Date Inactive Code Status Order ID Comments User Context   04/25/2016  3:50 PM 05/01/2016  2:25 PM Full Code [id number]  Fritzi Mandes, MD Inpatient   04/12/2016 12:39 PM 04/20/2016 10:09 PM Full Code [id number]  [name], MD ED   03/25/2016  7:41 PM 03/28/2016  4:21 PM Full Code [id number]  Baxter Hire, MD Inpatient   03/18/2016 11:46 AM 03/23/2016  7:42 PM Full Code [id number]  Lytle Butte, MD ED      TOTAL TIME TAKING CARE OF THIS PATIENT: 38 minutes.    Valisha Heslin M.D on 05/01/2016 at 3:53 PM  Between 7am to 6pm - Pager - [phone]  After 6pm go to [email or web address] - Proofreader  [facility]  Office  [phone]  CC: Primary care physician; [name], MD   Note: This dictation was prepared with Dragon dictation along with smaller phrase technology. Any transcriptional errors that result from this process are unintentional.

## 2016-05-01 NOTE — Progress Notes (Signed)
Pt being discharged, discharge instructions and prescriptions reviewed with pt and husband, states understanding, pt with no complaints at discharge, no distress or discomfort noted

## 2016-05-02 ENCOUNTER — Telehealth: Payer: Self-pay

## 2016-05-02 ENCOUNTER — Telehealth: Payer: Self-pay | Admitting: *Deleted

## 2016-05-02 ENCOUNTER — Other Ambulatory Visit: Payer: Self-pay

## 2016-05-02 ENCOUNTER — Other Ambulatory Visit: Payer: Self-pay | Admitting: *Deleted

## 2016-05-02 DIAGNOSIS — C541 Malignant neoplasm of endometrium: Secondary | ICD-10-CM

## 2016-05-02 DIAGNOSIS — K529 Noninfective gastroenteritis and colitis, unspecified: Secondary | ICD-10-CM

## 2016-05-02 MED ORDER — BUDESONIDE 3 MG PO CPEP: 9.0000 mg | ORAL_CAPSULE | ORAL | 2 refills | Status: AC

## 2016-05-02 MED ORDER — OMEPRAZOLE 20 MG PO CPDR: 20.0000 mg | DELAYED_RELEASE_CAPSULE | Freq: Every day | ORAL | 3 refills | Status: AC

## 2016-05-02 NOTE — Telephone Encounter (Signed)
Pt's husband was notified of colonoscopy results and rx for Budesonide 9mg  sent to pharmacy.

## 2016-05-02 NOTE — Telephone Encounter (Signed)
-----   Message from [name], MD sent at 05/02/2016  7:52 AM EDT ----- [name], Please let the patient know that the pathology did show some inflammation on the biopsies. Please start her on Budesinide 9mg  qam.

## 2016-05-02 NOTE — Telephone Encounter (Signed)
Neil Crouch verbal orders over the phone for the services listed below

## 2016-05-02 NOTE — Telephone Encounter (Signed)
They have reopened her to services and need orders for PT, OT, HHA, and SN. Also requesting med for protonix or Omeprazole for GERD

## 2016-05-03 ENCOUNTER — Telehealth: Payer: Self-pay | Admitting: *Deleted

## 2016-05-03 ENCOUNTER — Inpatient Hospital Stay: Payer: Medicare Other

## 2016-05-03 ENCOUNTER — Inpatient Hospital Stay (HOSPITAL_BASED_OUTPATIENT_CLINIC_OR_DEPARTMENT_OTHER): Payer: Medicare Other | Admitting: Hematology and Oncology

## 2016-05-03 ENCOUNTER — Encounter: Payer: Self-pay | Admitting: Hematology and Oncology

## 2016-05-03 VITALS — BP 84/60 | HR 114 | Temp 96.0°F | Resp 18 | Wt 152.0 lb

## 2016-05-03 DIAGNOSIS — K9403 Colostomy malfunction: Secondary | ICD-10-CM

## 2016-05-03 DIAGNOSIS — Z79899 Other long term (current) drug therapy: Secondary | ICD-10-CM

## 2016-05-03 DIAGNOSIS — F17210 Nicotine dependence, cigarettes, uncomplicated: Secondary | ICD-10-CM

## 2016-05-03 DIAGNOSIS — E876 Hypokalemia: Secondary | ICD-10-CM

## 2016-05-03 DIAGNOSIS — N189 Chronic kidney disease, unspecified: Secondary | ICD-10-CM | POA: Diagnosis not present

## 2016-05-03 DIAGNOSIS — R601 Generalized edema: Secondary | ICD-10-CM | POA: Diagnosis not present

## 2016-05-03 DIAGNOSIS — C779 Secondary and unspecified malignant neoplasm of lymph node, unspecified: Secondary | ICD-10-CM | POA: Diagnosis not present

## 2016-05-03 DIAGNOSIS — K432 Incisional hernia without obstruction or gangrene: Secondary | ICD-10-CM

## 2016-05-03 DIAGNOSIS — E46 Unspecified protein-calorie malnutrition: Secondary | ICD-10-CM

## 2016-05-03 DIAGNOSIS — R197 Diarrhea, unspecified: Secondary | ICD-10-CM

## 2016-05-03 DIAGNOSIS — C541 Malignant neoplasm of endometrium: Secondary | ICD-10-CM | POA: Diagnosis not present

## 2016-05-03 DIAGNOSIS — Z933 Colostomy status: Secondary | ICD-10-CM | POA: Diagnosis not present

## 2016-05-03 DIAGNOSIS — G629 Polyneuropathy, unspecified: Secondary | ICD-10-CM | POA: Diagnosis not present

## 2016-05-03 DIAGNOSIS — N184 Chronic kidney disease, stage 4 (severe): Secondary | ICD-10-CM

## 2016-05-03 DIAGNOSIS — E860 Dehydration: Secondary | ICD-10-CM

## 2016-05-03 DIAGNOSIS — Z5111 Encounter for antineoplastic chemotherapy: Secondary | ICD-10-CM | POA: Diagnosis present

## 2016-05-03 LAB — BASIC METABOLIC PANEL
Anion gap: 10 (ref 5–15)
BUN: 26 mg/dL — ABNORMAL HIGH (ref 6–20)
CO2: 25 mmol/L (ref 22–32)
Calcium: 8.3 mg/dL — ABNORMAL LOW (ref 8.9–10.3)
Chloride: 101 mmol/L (ref 101–111)
Creatinine, Ser: 2.45 mg/dL — ABNORMAL HIGH (ref 0.44–1.00)
GFR calc Af Amer: 22 mL/min — ABNORMAL LOW (ref >60)
GFR calc non Af Amer: 19 mL/min — ABNORMAL LOW (ref >60)
Glucose, Bld: 180 mg/dL — ABNORMAL HIGH (ref 65–99)
Potassium: 3.4 mmol/L — ABNORMAL LOW (ref 3.5–5.1)
Sodium: 136 mmol/L (ref 135–145)

## 2016-05-03 LAB — CBC WITH DIFFERENTIAL/PLATELET
Basophils Absolute: 0.1 10*3/uL (ref 0–0.1)
Basophils Relative: 1 %
Eosinophils Absolute: 0 10*3/uL (ref 0–0.7)
Eosinophils Relative: 0 %
HCT: 27.9 % — ABNORMAL LOW (ref 35.0–47.0)
Hemoglobin: 9.6 g/dL — ABNORMAL LOW (ref 12.0–16.0)
Lymphocytes Relative: 13 %
Lymphs Abs: 1.8 10*3/uL (ref 1.0–3.6)
MCH: 32.6 pg (ref 26.0–34.0)
MCHC: 34.5 g/dL (ref 32.0–36.0)
MCV: 94.6 fL (ref 80.0–100.0)
Monocytes Absolute: 1.6 10*3/uL — ABNORMAL HIGH (ref 0.2–0.9)
Monocytes Relative: 11 %
Neutro Abs: 11 10*3/uL — ABNORMAL HIGH (ref 1.4–6.5)
Neutrophils Relative %: 75 %
Platelets: 434 10*3/uL (ref 150–440)
RBC: 2.95 MIL/uL — ABNORMAL LOW (ref 3.80–5.20)
RDW: 16.6 % — ABNORMAL HIGH (ref 11.5–14.5)
WBC: 14.6 10*3/uL — ABNORMAL HIGH (ref 3.6–11.0)

## 2016-05-03 LAB — MAGNESIUM: Magnesium: 1.7 mg/dL (ref 1.7–2.4)

## 2016-05-03 MED ORDER — OCTREOTIDE ACETATE 100 MCG/ML IJ SOLN: 100.0000 ug | Freq: Three times a day (TID) | INTRAMUSCULAR | 2 refills | Status: DC

## 2016-05-03 MED ORDER — SODIUM CHLORIDE 0.9% FLUSH
10.0000 mL | INTRAVENOUS | Status: DC | PRN
  Administered 2016-05-03: 10 mL via INTRAVENOUS
  Filled 2016-05-03: qty 10

## 2016-05-03 MED ORDER — HEPARIN SOD (PORK) LOCK FLUSH 100 UNIT/ML IV SOLN
500.0000 [IU] | Freq: Once | INTRAVENOUS | Status: AC
  Administered 2016-05-03: 500 [IU] via INTRAVENOUS
  Filled 2016-05-03: qty 5

## 2016-05-03 MED ORDER — SODIUM CHLORIDE 0.9 % IV SOLN
Freq: Once | INTRAVENOUS | Status: AC
  Administered 2016-05-03: 10:00:00 via INTRAVENOUS
  Filled 2016-05-03: qty 1000

## 2016-05-03 MED ORDER — POTASSIUM CHLORIDE CRYS ER 20 MEQ PO TBCR: 20.0000 meq | EXTENDED_RELEASE_TABLET | Freq: Two times a day (BID) | ORAL | 1 refills | Status: DC

## 2016-05-03 NOTE — Telephone Encounter (Signed)
[name] called back and checked with her company and she is ok to give fluids first day and then future days go out and educate family to do.  They do not have a pharmacy so we will need to find pharmacy and give supplies.  [name] is comfortable with portacath.  I have contacted [facility] and then rcvd call that we can send order to [facility] Fax # [phone] and phone # [phone].  I asked about should I write a;; supplies and she said yes. So I wrote order for ns 500 ml , IV tubing for the infusion , heparin flush for after each infusion . I have also called allison at [facility] and left her a message what I have done , what pharmacy will be supplying and the phone # in case of problems.

## 2016-05-03 NOTE — Telephone Encounter (Signed)
Called allison at [employer] and asked for her to call me back. Pt needs IVF at home fri, sat, sun, and Mon and would she like [place] to leave her portacath needle in

## 2016-05-03 NOTE — Telephone Encounter (Signed)
Called and spoke to husband and told him that we got fluids set up but the [facility] does not have pharmacy to get supplies so we ordered them through [facility] and the supllies will coem to their house and I have called and left message with allison at home health that we got them ordered with above company and left her phone number if she has problems. I had spoke to her earlier in the day and she was comfortable using port and I let her know that it was accessed today and left needle in. It should be good because the needle has to be changed every 7 days so when she comes back next tues we can change it then.  I did say that Ebony Hail can come in and do infusion first day and teach him how to do it after that and he is agreeable to that.  He also states that he noticed her feet started swelling this evening and she is taking a nap and he propped her feet up om pillows while sleeping. If they get worse then he will call again she may need lasix like she had in hospital per husband.

## 2016-05-03 NOTE — Progress Notes (Signed)
Micro Clinic day:  05/03/2016   Chief Complaint: [name] is a 70 y.o. female with recurrent stage III endometrial cancer who is seen for reassessment after interval hospitalization.  HPI:  The patient was last seen by me in the medical oncology clinic on 04/25/2016.  At that time, she was profoundly fatigued/debiliated.  She had anasarca likely secondary to poor nutrition (albumen 1.4) and ongoing diarrhea.  She was admitted to the hospital  She was admitted to [facility] from 04/25/2016 - 05/01/2016.  Fluid was mobilized with IV albumen and Lasix.  Diarrhea was managed with SQ octreotide, Rifaxin, and low dose codeine.  She underwent colonoscopy by Dr. Allen Norris on 04/27/2016.  There was an 8 mm polyp in the transverse colon (tubular adenoma).  Random colon bopsies revealed focal mild active colitis suggestive of mild resolving infectious colitis.  The patient was given a prescription for budesonide 9 mg on 05/02/2016 (she has not filled yet).  She states that she has been "good" at home.  She states that her blood pressure is the "same".  She denies any lightheadedness or dizziness. Bowel movements are the same. She empties her ostomy bag 3-4 times a day. She does not want to take the potassium packets as they are too expensive. She requests pills as she does not see any whole pills in her ostomy anymore.  She is taking magnesium oxide 400 twice a day without any increase in stool output. She did not pick up the rifaximin as it was too expensive.   Past Medical History:  Diagnosis Date  . Cancer [facility]) 2011   endometrial  . Chronic kidney disease    STONES  . Diverticulitis of colon (without mention of hemorrhage)   . History of kidney stones 2011  . Mechanical complication of colostomy and enterostomy   . Obesity, unspecified   . Personal history of tobacco use, presenting hazards to health     Past Surgical History:  Procedure Laterality Date   . ABDOMINAL HYSTERECTOMY  2011  . APPENDECTOMY    . CHOLECYSTECTOMY  05/07/14   Incidental during ventral hernia repair.   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 COLON SURGERY  05/20/1999   Drainage of pelvic abscess, end colostomy  . COLON SURGERY  11/15/2010   Revision of colostomy stoma  . COLONOSCOPY  June 2010,03/31/14   Tubular adenoma of the transverse colon, hyperplastic polyps of the sigmoid.  Marland Kitchen COLONOSCOPY WITH PROPOFOL N/A 04/27/2016   Procedure: COLONOSCOPY WITH PROPOFOL;  Surgeon: [name], MD;  Location: [facility] ENDOSCOPY;  Service: Endoscopy;  Laterality: N/A;  . COLOSTOMY  2011  . DILATION AND CURETTAGE OF UTERUS    . HERNIA REPAIR  04/06/14   ventral hernia, tetro rectus Ventralex ST mesh, 20 x 33 cm.   Marland Kitchen HYSTEROSCOPY  2011  . LITHOTRIPSY  2009  . MOLE REMOVAL  2003  . PORTACATH PLACEMENT N/A 03/01/2016   Procedure: INSERTION PORT-A-CATH;  Surgeon: [name], MD;  Location: [facility] ORS;  Service: General;  Laterality: N/A;  . PYELOLITHOTOMY    . skin cancer removal  02/2014  . TUBAL LIGATION      Family History  Problem Relation Age of Onset  . Breast cancer Maternal Grandmother   . Colon cancer Mother   . Cancer Mother     Social History:  reports that she has been smoking  Cigarettes.  She has a 3.75 pack-year smoking history. She has never used smokeless tobacco. She reports that she does not drink alcohol or use drugs.  She smokes 7 cigarettes a day plus vapor cigarettes.  She doesn't want to quit smoking.  She is a retired [employer] scientist.  The patient is accompaied by her husband,  [name], today.  Allergies: No Known Allergies  Current Medications: Current Outpatient Prescriptions  Medication Sig Dispense Refill  . acetaminophen-codeine (TYLENOL #3) 300-30 MG tablet Take 1 tablet by mouth 3 (three) times daily. 30 tablet 0  . acidophilus (RISAQUAD) CAPS capsule Take 1 capsule by  mouth 2 (two) times daily. 60 capsule 2  . budesonide (ENTOCORT EC) 3 MG 24 hr capsule Take 3 capsules (9 mg total) by mouth every morning. 90 capsule 2  . calcium carbonate (TUMS - DOSED IN MG ELEMENTAL CALCIUM) 500 MG chewable tablet Chew 2 tablets (400 mg of elemental calcium total) by mouth 3 (three) times daily with meals. 90 tablet 1  . ENSURE (ENSURE) Take 1 Can by mouth.    Marland Kitchen LORazepam (ATIVAN) 0.5 MG tablet Take 1 tablet (0.5 mg total) by mouth every 8 (eight) hours as needed for anxiety. 30 tablet 0  . octreotide (SANDOSTATIN) 100 MCG/ML SOLN injection Inject 1 mL (100 mcg total) into the skin every 8 (eight) hours. 13 mL 2  . omeprazole (PRILOSEC) 20 MG capsule Take 1 capsule (20 mg total) by mouth daily. 30 capsule 3  . ondansetron (ZOFRAN) 8 MG tablet Take 8 mg by mouth 2 (two) times daily as needed.     Marland Kitchen POTASSIUM CHLORIDE PO Take 20 mEq by mouth 2 (two) times daily.    . magnesium oxide (MAG-OX) 400 (241.3 Mg) MG tablet Take 1 tablet (400 mg total) by mouth 2 (two) times daily. (Patient not taking: Reported on 05/03/2016) 60 tablet 1  . potassium chloride (KLOR-CON) 20 MEQ packet Take 20 mEq by mouth 2 (two) times daily. (Patient not taking: Reported on 05/03/2016) 60 packet 1  . potassium chloride SA (K-DUR,KLOR-CON) 20 MEQ tablet Take 1 tablet (20 mEq total) by mouth 2 (two) times daily. 30 tablet 1  . promethazine (PHENERGAN) 25 MG tablet Take 1 tablet (25 mg total) by mouth every 8 (eight) hours as needed for nausea or vomiting. (Patient not taking: Reported on 05/03/2016) 30 tablet 0  . rifaximin (XIFAXAN) 550 MG TABS tablet Take 1 tablet (550 mg total) by mouth 3 (three) times daily. X 12 more days (Patient not taking: Reported on 05/03/2016) 36 tablet 0   No current facility-administered medications for this visit.    Facility-Administered Medications Ordered in Other Visits  Medication Dose Route Frequency Provider Last Rate Last Dose  . magnesium sulfate IVPB 2 g 50 mL  2 g  Intravenous Once Lequita Asal, MD         Review of Systems:  GENERAL:  Doing "good".  Feels incapacitated.  No fevers or sweats.  Weight down 17 pounds since last clinic. PERFORMANCE STATUS (ECOG):  3 HEENT:  No visual changes, runny nose, sore throat, mouth sores or tenderness. Lungs:  No shortness of breath or cough.  No hemoptysis. Cardiac:  No chest pain, palpitations, orthopnea, or PND. GI:  Ostomy output stable (see HPI).  No nausea, vomiting, constipation, melena or hematochezia. GU:  No urgency, frequency, dysuria, or hematuria. Musculoskeletal:  No back pain.  No muscle tenderness. Extremities:  Concern about slight increase in lower extremity edema. Skin:  No  rashes, skin changes or ulcers. Neuro:  General weakness, improved.  No headache, numbness or weakness, balance or coordination issues. Endocrine:  No diabetes, thyroid issues, hot flashes or night sweats. Psych:  Denies any mood changes.  Her husband thinks she is needs an antidepressant.. Pain:  Pain associated with lower extremity swelling. Review of systems:  All other systems reviewed and found to be negative.  Physical Exam: Blood pressure (!) 84/60, pulse (!) 114, temperature (!) 96 F (35.6 C), temperature source Tympanic, resp. rate 18, weight 152 lb (68.9 kg). GENERAL:  Chronically ill appearing woman sitting comfortably in a wheelchair in the exam room in no acute distress. MENTAL STATUS:  Alert and oriented to person, place and time. HEAD:  Short gray hair.  Normocephalic, atraumatic, face symmetric, no Cushingoid features. EYES:  Blue eyes.  Pupils equal round and reactive to light and accomodation.  No conjunctivitis or scleral icterus. ENT:  Oropharynx clear without lesion.  Tongue normal. Mucous membranes moist.  RESPIRATORY:  Clear to auscultation without rales, wheezes or rhonchi. CARDIOVASCULAR:  Regular rate and rhythm without murmur, rub or gallop. ABDOMEN:  Colostomy.  Soft, non-tender, with  active bowel sounds, and no hepatosplenomegaly.  No masses. SKIN:  No erythema. EXTREMITIES: No lower extremity edema.  No palpable cords. LYMPH NODES: No palpable cervical, supraclavicular, axillary or inguinal adenopathy  NEUROLOGICAL: Unremarkable. PSYCH:  Appropriate.  Labs: Infusion on 05/03/2016  Component Date Value Ref Range Status  . WBC 05/03/2016 14.6* 3.6 - 11.0 K/uL Final  . RBC 05/03/2016 2.95* 3.80 - 5.20 MIL/uL Final  . Hemoglobin 05/03/2016 9.6* 12.0 - 16.0 g/dL Final  . HCT 05/03/2016 27.9* 35.0 - 47.0 % Final  . MCV 05/03/2016 94.6  80.0 - 100.0 fL Final  . MCH 05/03/2016 32.6  26.0 - 34.0 pg Final  . MCHC 05/03/2016 34.5  32.0 - 36.0 g/dL Final  . RDW 05/03/2016 16.6* 11.5 - 14.5 % Final  . Platelets 05/03/2016 434  150 - 440 K/uL Final  . Neutrophils Relative % 05/03/2016 75  % Final  . Neutro Abs 05/03/2016 11.0* 1.4 - 6.5 K/uL Final  . Lymphocytes Relative 05/03/2016 13  % Final  . Lymphs Abs 05/03/2016 1.8  1.0 - 3.6 K/uL Final  . Monocytes Relative 05/03/2016 11  % Final  . Monocytes Absolute 05/03/2016 1.6* 0.2 - 0.9 K/uL Final  . Eosinophils Relative 05/03/2016 0  % Final  . Eosinophils Absolute 05/03/2016 0.0  0 - 0.7 K/uL Final  . Basophils Relative 05/03/2016 1  % Final  . Basophils Absolute 05/03/2016 0.1  0 - 0.1 K/uL Final  . Sodium 05/03/2016 136  135 - 145 mmol/L Final  . Potassium 05/03/2016 3.4* 3.5 - 5.1 mmol/L Final  . Chloride 05/03/2016 101  101 - 111 mmol/L Final  . CO2 05/03/2016 25  22 - 32 mmol/L Final  . Glucose, Bld 05/03/2016 180* 65 - 99 mg/dL Final  . BUN 05/03/2016 26* 6 - 20 mg/dL Final  . Creatinine, Ser 05/03/2016 2.45* 0.44 - 1.00 mg/dL Final  . Calcium 05/03/2016 8.3* 8.9 - 10.3 mg/dL Final  . GFR calc non Af Amer 05/03/2016 19* >60 mL/min Final  . GFR calc Af Amer 05/03/2016 22* >60 mL/min Final   Comment: (NOTE) The eGFR has been calculated using the CKD EPI equation. This calculation has not been validated in all  clinical situations. eGFR's persistently <60 mL/min signify possible Chronic Kidney Disease.   . Anion gap 05/03/2016 10  5 - 15  Final  . Magnesium 05/03/2016 1.7  1.7 - 2.4 mg/dL Final    Assessment:  ONNIKA SIEBEL is a 70 y.o. female with a history of stage III endometrial carcinoma stats post TAH/BSO in 02/2010.  Pathology revealed grade III endometrial carcinoma.  Tumor was 3 x 3 cm and extended through the thickness of the myometrium.  There were 2 of 6 right pelvic lymph nodes and 2 of 6 periaortic nodes involved with metastatic disease. Peritoneal washings were negative.  Pathologic stage was T3N2M0 (stage III c2).  She received 6 cycles of carboplatin and Taxol from 03/27/2010 until 08/23/2010.  She received brachytherapy (completed 11/2010).  She developed a grade I/II neuropathy.  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 Following re-anastomosis, she developed a leak and underwent diverting colostomy.  She has had 3 incisional hernias.  PET scan on 02/16/2016 revealed a 4.4 x 5.0 cm hypermetabolic soft tissue mass along the right pelvic sidewall, presumably malignant metastatic lymphadenopathy.  There was also metastatic lymphadenopathy in the left para-aortic nodal station adjacent to the left renal hilum, and in the right retrocrural nodal station.  There was a metastatic lesion in the right ilium.  There was evidence of persistent partial small bowel obstruction.  CA125 results are as follows: 19 on 01/19/2014, 23.6 on 07/23/2014, 40.5 on 01/26/2015, 102.5 on 02/08/2016, 82.7 on 03/07/2016, and 77.9 on 04/02/2016.  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  MSI testing is pending.   She is s/p 2 cycles of carboplatin and Taxol (03/07/2016 - 04/05/2016).    Cycle #1 was complicated by 2 admissions to [facility] (03/18/2016 and 03/25/2016) with acute renal failure  secondary to dehydration from diarrhea.  She was treated with IVF and anti-emetics.  Her diarrhea improved with sandostatin 100 mcg SQ q 8 hours.  Stool was negative for C diff.  Sodium bicarbonate was instituted for a low bicarbonate secondary to her diarrhea.   She was admitted to [facility] from 04/12/2016 - 04/20/2016 with acute renal failure (Cr 4.35) secondary to high output from her colostomy.  She had significant electrolyte abnormalities (hypokalemia, hypophosphatemia, hypomagnesemia).  Diarrhea was only controlled with octreotide SQ.  She has significant pancytopenia due to chemotherapy.  Whitmer nadir was 200 on 04/12/2016.  She had persistent thrombocytopenia (platelet count 12,000).  HIT assay and serotonin release assay were normal.  She received 2 units of PRBCs on 04/20/2016.  She was admitted to [facility] from 04/25/2016 - 05/01/2016 with anasarca secondary to poor nutrition (albumen 1.4) and ongoing diarrhea.  Fluid was mobilized with IV albumen and Lasix.  Diarrhea was managed with SQ octreotide, Rifaxin, and low dose codeine.  Colonoscopy on 04/27/2016 revealed an 8 mm polyp in the transverse colon (tubular adenoma). Random colon bopsies revealed focal mild active colitis.  She was started on budesonide on 05/02/2016.  She has chronic renal insufficiency.  Creatinine was 1.45 (CrCl 36 ml/min).  Creatinine is 2.45 (CrCl is 19 ml/min) today.  Magnesium is 1.7 on supplementation.  Potassium is 3.4 on supplementation.  Symptomatically,  she feels better with mobilization of her fluids (anasarca).  She is eating and drinking at home.  Blood pressure is chronically low. Xifaxan is cost-prohibitive.  Plan: 1.  Labs today:  CBC with diff, BMP, Mg. 2.  Discuss hospitalization and colonoscopy results.  Patient to start budesonide today (pick up from pharmacy). 3.  Discuss plan to re-initiate chemotherapy once GI tract  and renal function stabilize.  Patient in agreement. 4.  Phone follow-up with Dr. Allen Norris.  No  need to continue Xifaxan (rifaximin).  Will try Enterogam when samples available next week.  Patient to continue octreotide. 5.  Phone follow-up with Dr. Candiss Norse.  Begin daily hydration 500 cc a day through home health.  Labs faxed to office. 6.  IVF 500 cc today. 7.  Patient set up with all services (home health, PT, OT). 8.  Rx:  octreotide 100 mcg SQ q 8 hours prn diarrhea. 9.  Rx:  potassium chloride 20 meq po BID. 10.  Continue oral magnesium oxide. 95.  Patient's husband to call overlong holiday weekend if any concerns. 12.  RTC on 05/08/2016 for MD assess, labs (BMP, Mg) and octreotide LAR 10 mg +/- IVF  Over 50 minutes were spent with the patient and coordinating care with multiple providers.   Lequita Asal, MD  05/03/2016

## 2016-05-04 ENCOUNTER — Other Ambulatory Visit: Payer: Self-pay | Admitting: *Deleted

## 2016-05-04 ENCOUNTER — Telehealth: Payer: Self-pay | Admitting: *Deleted

## 2016-05-04 ENCOUNTER — Inpatient Hospital Stay: Payer: Medicare Other | Admitting: Family Medicine

## 2016-05-04 DIAGNOSIS — N179 Acute kidney failure, unspecified: Secondary | ICD-10-CM

## 2016-05-04 DIAGNOSIS — C541 Malignant neoplasm of endometrium: Secondary | ICD-10-CM

## 2016-05-04 DIAGNOSIS — K9403 Colostomy malfunction: Secondary | ICD-10-CM

## 2016-05-04 NOTE — Telephone Encounter (Signed)
Asking for a call back from [facility], stating she arranged some special services for her, but hasn't arrived

## 2016-05-04 NOTE — Telephone Encounter (Signed)
Called [facility] and [facility] to [facility] and they have sent the supplies out and it shoulde arrive to pt's house in next 80  Min.  Also they wanted the number to [facility] and I told her that I have been speaking to [facility] and gave them her phone number.  After speaking to all parties I called husband and he got  A call later in the afternoon and supplies should come by 4 pm and allison should come around 5 pm to give infusion

## 2016-05-07 ENCOUNTER — Other Ambulatory Visit: Payer: Self-pay | Admitting: Hematology and Oncology

## 2016-05-07 DIAGNOSIS — C541 Malignant neoplasm of endometrium: Secondary | ICD-10-CM

## 2016-05-07 DIAGNOSIS — R197 Diarrhea, unspecified: Secondary | ICD-10-CM

## 2016-05-07 DIAGNOSIS — E46 Unspecified protein-calorie malnutrition: Secondary | ICD-10-CM

## 2016-05-07 NOTE — Progress Notes (Signed)
[facility] day:  05/03/2016   Chief Complaint: [name] is a 70 y.o. female with recurrent stage III endometrial cancer who is seen for 1 week assessment.  HPI:  The patient was last seen by me in the medical oncology clinic on 05/03/2016.  At that time, she was seen for reassessment after interval hospitalization.  Symptomatically, she felt better with mobilization of her fluids (anasarca).  She was eating and drinking at home.  Blood pressure was low.  Xifaxan was cost-prohibitive.  She received IVF in clinic and was set up with fluids (500 cc NS) daily with home health.  BUN was 26 with a creatinine of 2.5.  She continued her oral potassium and magnesium supplementation.  She was to start oral budesonide for colitis.   She started budesonide on 05/04/2016.  She received daily 500 cc NS IV via home health (05/04/2016 - 05/07/2016).  Her bowel movements have improved.  She has no bowel movements on 09/01 and 09/02.  She has had 1 pasty bowel movement a day on 09/03 and 09/04.  She stopped octreotide on 05/05/2016.  She notes that her appetite has been poor.  She has had recurrence of fluid in her right leg and alos left hand.  She stats that she can't catch her breath.  She is spending the majority of day in bed.  She states that she has "nothing left" as she is so exhausted.  She denies any fevers.   Past Medical History:  Diagnosis Date  . Cancer [facility]) 2011   endometrial  . Chronic kidney disease    STONES  . Diverticulitis of colon (without mention of hemorrhage)   . History of kidney stones 2011  . Mechanical complication of colostomy and enterostomy   . Obesity, unspecified   . Personal history of tobacco use, presenting hazards to health     Past Surgical History:  Procedure Laterality Date  . ABDOMINAL HYSTERECTOMY  2011  . APPENDECTOMY    . CHOLECYSTECTOMY  05/07/14   Incidental during ventral hernia repair.   [facility] COLON  SURGERY  05/10/2010   Sigmoid colectomy with takedown of colovaginal fistula, drainage pelvic side wall seroma  . COLON SURGERY  05/20/1999   Drainage of pelvic abscess, end colostomy  . COLON SURGERY  11/15/2010   Revision of colostomy stoma  . COLONOSCOPY  June 2010,03/31/14   Tubular adenoma of the transverse colon, hyperplastic polyps of the sigmoid.  [facility] COLONOSCOPY WITH PROPOFOL N/A 04/27/2016   Procedure: COLONOSCOPY WITH PROPOFOL;  Surgeon: [name], MD;  Location: [facility] ENDOSCOPY;  Service: Endoscopy;  Laterality: N/A;  . COLOSTOMY  2011  . DILATION AND CURETTAGE OF UTERUS    . HERNIA REPAIR  04/06/14   ventral hernia, tetro rectus Ventralex ST mesh, 20 x 33 cm.   [facility] HYSTEROSCOPY  2011  . LITHOTRIPSY  2009  . MOLE REMOVAL  2003  . PORTACATH PLACEMENT N/A 03/01/2016   Procedure: INSERTION PORT-A-CATH;  Surgeon: [name], MD;  Location: [facility] ORS;  Service: General;  Laterality: N/A;  . PYELOLITHOTOMY    . skin cancer removal  02/2014  . TUBAL LIGATION      Family History  Problem Relation Age of Onset  . Breast cancer Maternal Grandmother   . Colon cancer Mother   . Cancer Mother     Social History:  reports that she has been smoking Cigarettes.  She has a 3.75 pack-year smoking history. She  has never used smokeless tobacco. She reports that she does not drink alcohol or use drugs.  She smokes 7 cigarettes a day plus vapor cigarettes.  She doesn't want to quit smoking.  She is a retired Marine scientist.  The patient is accompaied by her husband,  [name], today.  Allergies: No Known Allergies  Current Medications: Current Outpatient Prescriptions  Medication Sig Dispense Refill  . acetaminophen-codeine (TYLENOL #3) 300-30 MG tablet Take 1 tablet by mouth 3 (three) times daily. 30 tablet 0  . acidophilus (RISAQUAD) CAPS capsule Take 1 capsule by mouth 2 (two) times daily. 60 capsule 2  . budesonide (ENTOCORT EC) 3 MG 24 hr capsule Take 3 capsules (9 mg total) by mouth every  morning. 90 capsule 2  . calcium carbonate (TUMS - DOSED IN MG ELEMENTAL CALCIUM) 500 MG chewable tablet Chew 2 tablets (400 mg of elemental calcium total) by mouth 3 (three) times daily with meals. 90 tablet 1  . ENSURE (ENSURE) Take 1 Can by mouth.    . magnesium oxide (MAG-OX) 400 (241.3 Mg) MG tablet Take 1 tablet (400 mg total) by mouth 2 (two) times daily. 60 tablet 1  . omeprazole (PRILOSEC) 20 MG capsule Take 1 capsule (20 mg total) by mouth daily. 30 capsule 3  . ondansetron (ZOFRAN) 8 MG tablet Take 8 mg by mouth 2 (two) times daily as needed.     . potassium chloride (KLOR-CON) 20 MEQ packet Take 20 mEq by mouth 2 (two) times daily. 60 packet 1  . potassium chloride SA (K-DUR,KLOR-CON) 20 MEQ tablet Take 1 tablet (20 mEq total) by mouth 2 (two) times daily. (Patient not taking: Reported on 05/08/2016) 30 tablet 1  . promethazine (PHENERGAN) 25 MG tablet Take 1 tablet (25 mg total) by mouth every 8 (eight) hours as needed for nausea or vomiting. (Patient not taking: Reported on 05/08/2016) 30 tablet 0   No current facility-administered medications for this visit.    Facility-Administered Medications Ordered in Other Visits  Medication Dose Route Frequency Provider Last Rate Last Dose  . magnesium sulfate IVPB 2 g 50 mL  2 g Intravenous Once Lequita Asal, MD         Review of Systems:  GENERAL:  Exhausted.  No fevers or sweats.  Weight stable. PERFORMANCE STATUS (ECOG):  3 HEENT:  No visual changes, runny nose, sore throat, mouth sores or tenderness. Lungs:  "Can't catch my breath".  Nocough.  No hemoptysis. Cardiac:  No chest pain, palpitations, orthopnea, or PND. GI:  Ostomy output dramatically improved (see HPI).  Poor oral intake.  No nausea, vomiting, constipation, melena or hematochezia. GU:  No urgency, frequency, dysuria, or hematuria. Musculoskeletal:  No back pain.  No muscle tenderness. Extremities:  Right lower extremity edema and left hand edema. Skin:  No rashes,  skin changes or ulcers. Neuro:  General weakness.  No headache, numbness or weakness, balance or coordination issues. Endocrine:  No diabetes, thyroid issues, hot flashes or night sweats. Psych:  Emotionally exhausted. Pain:  No pain. Review of systems:  All other systems reviewed and found to be negative.  Physical Exam: Blood pressure (!) 84/60, pulse (!) 114, temperature (!) 96 F (35.6 C), temperature source Tympanic, resp. rate 18, weight 152 lb (68.9 kg). GENERAL:  Chronically ill appearing somewhat disheveled woman sitting comfortably in a wheelchair in the exam room in no acute distress. MENTAL STATUS:  Alert and oriented to person, place and time. HEAD:  Short gray hair.  Normocephalic, atraumatic, face  symmetric, no Cushingoid features. EYES:  Blue eyes.  Pupils equal round and reactive to light and accomodation.  No conjunctivitis or scleral icterus. ENT:  Oropharynx clear without lesion.  Tongue normal. Mucous membranes moist.  RESPIRATORY:  Poor respiratory excursion.  Decreased breath sounds at the bases.  Clear to auscultation without rales, wheezes or rhonchi. CARDIOVASCULAR:  Regular rate and rhythm without murmur, rub or gallop. ABDOMEN:  Colostomy.  Soft, non-tender, with active bowel sounds, and no hepatosplenomegaly.  No masses. SKIN:  No erythema. EXTREMITIES:  Right lower extremity edema.  Left hand edema.  No palpable cords. LYMPH NODES: No palpable cervical, supraclavicular, axillary or inguinal adenopathy  NEUROLOGICAL: Unremarkable. PSYCH:  Defeated.  Labs: Infusion on 05/08/2016  Component Date Value Ref Range Status  . WBC 05/08/2016 19.6* 3.6 - 11.0 K/uL Final  . RBC 05/08/2016 2.75* 3.80 - 5.20 MIL/uL Final  . Hemoglobin 05/08/2016 8.6* 12.0 - 16.0 g/dL Final  . HCT 05/08/2016 26.3* 35.0 - 47.0 % Final  . MCV 05/08/2016 95.8  80.0 - 100.0 fL Final  . MCH 05/08/2016 31.4  26.0 - 34.0 pg Final  . MCHC 05/08/2016 32.7  32.0 - 36.0 g/dL Final  . RDW  05/08/2016 17.6* 11.5 - 14.5 % Final  . Platelets 05/08/2016 241  150 - 440 K/uL Final  . Neutrophils Relative % 05/08/2016 86  % Final  . Neutro Abs 05/08/2016 16.9* 1.4 - 6.5 K/uL Final  . Lymphocytes Relative 05/08/2016 7  % Final  . Lymphs Abs 05/08/2016 1.4  1.0 - 3.6 K/uL Final  . Monocytes Relative 05/08/2016 6  % Final  . Monocytes Absolute 05/08/2016 1.1* 0.2 - 0.9 K/uL Final  . Eosinophils Relative 05/08/2016 0  % Final  . Eosinophils Absolute 05/08/2016 0.0  0 - 0.7 K/uL Final  . Basophils Relative 05/08/2016 1  % Final  . Basophils Absolute 05/08/2016 0.1  0 - 0.1 K/uL Final  Appointment on 05/08/2016  Component Date Value Ref Range Status  . Sodium 05/08/2016 135  135 - 145 mmol/L Final  . Potassium 05/08/2016 4.9  3.5 - 5.1 mmol/L Final  . Chloride 05/08/2016 109  101 - 111 mmol/L Final  . CO2 05/08/2016 19* 22 - 32 mmol/L Final  . Glucose, Bld 05/08/2016 128* 65 - 99 mg/dL Final  . BUN 05/08/2016 48* 6 - 20 mg/dL Final  . Creatinine, Ser 05/08/2016 1.99* 0.44 - 1.00 mg/dL Final  . Calcium 05/08/2016 7.1* 8.9 - 10.3 mg/dL Final  . GFR calc non Af Amer 05/08/2016 24* >60 mL/min Final  . GFR calc Af Amer 05/08/2016 28* >60 mL/min Final   Comment: (NOTE) The eGFR has been calculated using the CKD EPI equation. This calculation has not been validated in all clinical situations. eGFR's persistently <60 mL/min signify possible Chronic Kidney Disease.   . Anion gap 05/08/2016 7  5 - 15 Final  . Magnesium 05/08/2016 1.9  1.7 - 2.4 mg/dL Final    Assessment:  [name] is a 70 y.o. female with a history of stage III endometrial carcinoma stats post TAH/BSO in 02/2010.  Pathology revealed grade III endometrial carcinoma.  Tumor was 3 x 3 cm and extended through the thickness of the myometrium.  There were 2 of 6 right pelvic lymph nodes and 2 of 6 periaortic nodes involved with metastatic disease. Peritoneal washings were negative.  Pathologic stage was T3N2M0 (stage  III c2).  She received 6 cycles of carboplatin and Taxol from 03/27/2010 until 08/23/2010.  She received brachytherapy (completed 11/2010).  She developed a grade I/II neuropathy.  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 Following re-anastomosis, she developed a leak and underwent diverting colostomy.  She has had 3 incisional hernias.  PET scan on 02/16/2016 revealed a 4.4 x 5.0 cm hypermetabolic soft tissue mass along the right pelvic sidewall, presumably malignant metastatic lymphadenopathy.  There was also metastatic lymphadenopathy in the left para-aortic nodal station adjacent to the left renal hilum, and in the right retrocrural nodal station.  There was a metastatic lesion in the right ilium.  There was evidence of persistent partial small bowel obstruction.  CA125 results are as follows: 19 on 01/19/2014, 23.6 on 07/23/2014, 40.5 on 01/26/2015, 102.5 on 02/08/2016, 82.7 on 03/07/2016, and 77.9 on 04/02/2016.  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  MSI testing is pending.   She is s/p 2 cycles of carboplatin and Taxol (03/07/2016 - 04/05/2016).    Cycle #1 was complicated by 2 admissions to [facility] (03/18/2016 and 03/25/2016) with acute renal failure secondary to dehydration from diarrhea.  She was treated with IVF and anti-emetics.  Her diarrhea improved with sandostatin 100 mcg SQ q 8 hours.  Stool was negative for C diff.  Sodium bicarbonate was instituted for a low bicarbonate secondary to her diarrhea.   She was admitted to [facility] from 04/12/2016 - 04/20/2016 with acute renal failure (Cr 4.35) secondary to high output from her colostomy.  She had significant electrolyte abnormalities (hypokalemia, hypophosphatemia, hypomagnesemia).  Diarrhea was only controlled with octreotide SQ.  She has significant pancytopenia due to chemotherapy.  [place] nadir was 200 on 04/12/2016.  She  had persistent thrombocytopenia (platelet count 12,000).  HIT assay and serotonin release assay were normal.  She received 2 units of PRBCs on 04/20/2016.  She was admitted to [facility] from 04/25/2016 - 05/01/2016 with anasarca secondary to poor nutrition (albumen 1.4) and ongoing diarrhea.  Fluid was mobilized with IV albumen and Lasix.  Diarrhea was managed with SQ octreotide, Rifaxin, and low dose codeine.  Colonoscopy on 04/27/2016 revealed an 8 mm polyp in the transverse colon (tubular adenoma). Random colon bopsies revealed focal mild active colitis.  She has chronic diarrhea.  Diarrhea has improved dramatically after initiation of budesonide (05/04/2016).  She received sandostatin LAR 10 mg on 04/03/2016.  She previously received octreotide 100 mcg SQ q 8 hours (stopped 05/05/2016).  She has chronic renal insufficiency.  Creatinine was 1.45 (CrCl 36 ml/min).  Creatinine was 2.45 (CrCl is 19 ml/min) on 05/03/2016 and 1.99 (CrCl  24 ml/min) after IVF for 5 days.  She has mild hypocalcemia (corrected calcium 8.3).  Magnesium is 1.9 on supplementation.  Potassium is 4.9 on supplementation.  Symptomatically, she has severe failure to thrive (FTT).  Blood pressure is borderline.  WBC is elevated (? secondary to infection or budesonide incidence 1-5%).  She has new left lower extremity edema worrisome for DVT given immobility.  Her husband is unable to care for her at home.  Plan: 1.  Labs today:  BMP, Mg. 2.  Admit to hospital. 3.  RLE Duplex to r/o DVT. 4.  Look for potential source of infection.  CXR (PA and lateral).  UA and culture. 5.  IVF with diuresis.  Monitor electrolytes closely ad supplement as needed. 6.  Consult nutrition and physical therapy. 7.  Continue budesonide, 8.  Anticipate discharge to skilled nursing facility after discharge.   Zaylan Kissoon  Elmarie Mainland, MD  05/08/2016, 12:13 PM

## 2016-05-08 ENCOUNTER — Inpatient Hospital Stay: Payer: Medicare Other | Admitting: Hematology and Oncology

## 2016-05-08 ENCOUNTER — Encounter: Payer: Self-pay | Admitting: Hematology and Oncology

## 2016-05-08 ENCOUNTER — Encounter: Payer: Self-pay | Admitting: Student

## 2016-05-08 ENCOUNTER — Inpatient Hospital Stay: Payer: Medicare Other | Attending: Hematology and Oncology

## 2016-05-08 ENCOUNTER — Inpatient Hospital Stay: Payer: Medicare Other

## 2016-05-08 ENCOUNTER — Inpatient Hospital Stay
Admission: AD | Admit: 2016-05-08 | Discharge: 2016-05-11 | DRG: 682 | Disposition: A | Discharge status: Skilled Nursing Facility | Payer: Medicare Other | Source: Ambulatory Visit | Attending: Internal Medicine | Admitting: Internal Medicine

## 2016-05-08 VITALS — BP 159/119 | HR 90 | Wt 152.3 lb

## 2016-05-08 DIAGNOSIS — D72829 Elevated white blood cell count, unspecified: Secondary | ICD-10-CM

## 2016-05-08 DIAGNOSIS — R531 Weakness: Secondary | ICD-10-CM

## 2016-05-08 DIAGNOSIS — C541 Malignant neoplasm of endometrium: Secondary | ICD-10-CM

## 2016-05-08 DIAGNOSIS — Z8542 Personal history of malignant neoplasm of other parts of uterus: Secondary | ICD-10-CM | POA: Diagnosis not present

## 2016-05-08 DIAGNOSIS — D631 Anemia in chronic kidney disease: Secondary | ICD-10-CM | POA: Diagnosis present

## 2016-05-08 DIAGNOSIS — Z79899 Other long term (current) drug therapy: Secondary | ICD-10-CM | POA: Insufficient documentation

## 2016-05-08 DIAGNOSIS — N189 Chronic kidney disease, unspecified: Secondary | ICD-10-CM | POA: Insufficient documentation

## 2016-05-08 DIAGNOSIS — E46 Unspecified protein-calorie malnutrition: Secondary | ICD-10-CM

## 2016-05-08 DIAGNOSIS — R627 Adult failure to thrive: Secondary | ICD-10-CM | POA: Diagnosis present

## 2016-05-08 DIAGNOSIS — I959 Hypotension, unspecified: Secondary | ICD-10-CM | POA: Diagnosis present

## 2016-05-08 DIAGNOSIS — I700 Atherosclerosis of aorta: Secondary | ICD-10-CM | POA: Diagnosis present

## 2016-05-08 DIAGNOSIS — Z933 Colostomy status: Secondary | ICD-10-CM | POA: Diagnosis not present

## 2016-05-08 DIAGNOSIS — Z90711 Acquired absence of uterus with remaining cervical stump: Secondary | ICD-10-CM | POA: Insufficient documentation

## 2016-05-08 DIAGNOSIS — E860 Dehydration: Secondary | ICD-10-CM | POA: Diagnosis present

## 2016-05-08 DIAGNOSIS — K9403 Colostomy malfunction: Secondary | ICD-10-CM

## 2016-05-08 DIAGNOSIS — R188 Other ascites: Secondary | ICD-10-CM | POA: Diagnosis present

## 2016-05-08 DIAGNOSIS — C7951 Secondary malignant neoplasm of bone: Secondary | ICD-10-CM | POA: Diagnosis present

## 2016-05-08 DIAGNOSIS — D649 Anemia, unspecified: Secondary | ICD-10-CM

## 2016-05-08 DIAGNOSIS — R600 Localized edema: Secondary | ICD-10-CM | POA: Insufficient documentation

## 2016-05-08 DIAGNOSIS — E8809 Other disorders of plasma-protein metabolism, not elsewhere classified: Secondary | ICD-10-CM | POA: Diagnosis present

## 2016-05-08 DIAGNOSIS — Z87442 Personal history of urinary calculi: Secondary | ICD-10-CM | POA: Insufficient documentation

## 2016-05-08 DIAGNOSIS — E669 Obesity, unspecified: Secondary | ICD-10-CM | POA: Diagnosis present

## 2016-05-08 DIAGNOSIS — R601 Generalized edema: Secondary | ICD-10-CM | POA: Diagnosis present

## 2016-05-08 DIAGNOSIS — N179 Acute kidney failure, unspecified: Secondary | ICD-10-CM

## 2016-05-08 DIAGNOSIS — Z90722 Acquired absence of ovaries, bilateral: Secondary | ICD-10-CM | POA: Insufficient documentation

## 2016-05-08 DIAGNOSIS — Z923 Personal history of irradiation: Secondary | ICD-10-CM | POA: Insufficient documentation

## 2016-05-08 DIAGNOSIS — N183 Chronic kidney disease, stage 3 (moderate): Secondary | ICD-10-CM | POA: Diagnosis present

## 2016-05-08 DIAGNOSIS — N390 Urinary tract infection, site not specified: Secondary | ICD-10-CM | POA: Diagnosis present

## 2016-05-08 DIAGNOSIS — R7401 Elevation of levels of liver transaminase levels: Secondary | ICD-10-CM

## 2016-05-08 DIAGNOSIS — R0600 Dyspnea, unspecified: Secondary | ICD-10-CM

## 2016-05-08 DIAGNOSIS — F17210 Nicotine dependence, cigarettes, uncomplicated: Secondary | ICD-10-CM | POA: Diagnosis present

## 2016-05-08 DIAGNOSIS — Z7189 Other specified counseling: Secondary | ICD-10-CM | POA: Diagnosis not present

## 2016-05-08 DIAGNOSIS — R609 Edema, unspecified: Secondary | ICD-10-CM

## 2016-05-08 DIAGNOSIS — Z90710 Acquired absence of both cervix and uterus: Secondary | ICD-10-CM

## 2016-05-08 DIAGNOSIS — R630 Anorexia: Secondary | ICD-10-CM | POA: Diagnosis not present

## 2016-05-08 DIAGNOSIS — Z800 Family history of malignant neoplasm of digestive organs: Secondary | ICD-10-CM

## 2016-05-08 DIAGNOSIS — R8281 Pyuria: Secondary | ICD-10-CM

## 2016-05-08 DIAGNOSIS — Z515 Encounter for palliative care: Secondary | ICD-10-CM

## 2016-05-08 DIAGNOSIS — R740 Nonspecific elevation of levels of transaminase and lactic acid dehydrogenase [LDH]: Secondary | ICD-10-CM | POA: Diagnosis present

## 2016-05-08 DIAGNOSIS — C778 Secondary and unspecified malignant neoplasm of lymph nodes of multiple regions: Secondary | ICD-10-CM | POA: Insufficient documentation

## 2016-05-08 DIAGNOSIS — C7989 Secondary malignant neoplasm of other specified sites: Secondary | ICD-10-CM | POA: Diagnosis present

## 2016-05-08 DIAGNOSIS — R7989 Other specified abnormal findings of blood chemistry: Secondary | ICD-10-CM | POA: Diagnosis present

## 2016-05-08 DIAGNOSIS — Z9221 Personal history of antineoplastic chemotherapy: Secondary | ICD-10-CM | POA: Insufficient documentation

## 2016-05-08 DIAGNOSIS — E43 Unspecified severe protein-calorie malnutrition: Secondary | ICD-10-CM | POA: Diagnosis present

## 2016-05-08 DIAGNOSIS — K529 Noninfective gastroenteritis and colitis, unspecified: Secondary | ICD-10-CM | POA: Insufficient documentation

## 2016-05-08 DIAGNOSIS — R5383 Other fatigue: Secondary | ICD-10-CM

## 2016-05-08 DIAGNOSIS — Z803 Family history of malignant neoplasm of breast: Secondary | ICD-10-CM

## 2016-05-08 DIAGNOSIS — K5660 Unspecified intestinal obstruction: Secondary | ICD-10-CM | POA: Diagnosis present

## 2016-05-08 DIAGNOSIS — I129 Hypertensive chronic kidney disease with stage 1 through stage 4 chronic kidney disease, or unspecified chronic kidney disease: Secondary | ICD-10-CM | POA: Insufficient documentation

## 2016-05-08 DIAGNOSIS — Z789 Other specified health status: Secondary | ICD-10-CM | POA: Diagnosis not present

## 2016-05-08 LAB — CBC WITH DIFFERENTIAL/PLATELET
Basophils Absolute: 0.1 10*3/uL (ref 0–0.1)
Basophils Relative: 1 %
Eosinophils Absolute: 0 10*3/uL (ref 0–0.7)
Eosinophils Relative: 0 %
HCT: 26.3 % — ABNORMAL LOW (ref 35.0–47.0)
Hemoglobin: 8.6 g/dL — ABNORMAL LOW (ref 12.0–16.0)
Lymphocytes Relative: 7 %
Lymphs Abs: 1.4 10*3/uL (ref 1.0–3.6)
MCH: 31.4 pg (ref 26.0–34.0)
MCHC: 32.7 g/dL (ref 32.0–36.0)
MCV: 95.8 fL (ref 80.0–100.0)
Monocytes Absolute: 1.1 10*3/uL — ABNORMAL HIGH (ref 0.2–0.9)
Monocytes Relative: 6 %
Neutro Abs: 16.9 10*3/uL — ABNORMAL HIGH (ref 1.4–6.5)
Neutrophils Relative %: 86 %
Platelets: 241 10*3/uL (ref 150–440)
RBC: 2.75 MIL/uL — ABNORMAL LOW (ref 3.80–5.20)
RDW: 17.6 % — ABNORMAL HIGH (ref 11.5–14.5)
WBC: 19.6 10*3/uL — ABNORMAL HIGH (ref 3.6–11.0)

## 2016-05-08 LAB — BASIC METABOLIC PANEL
Anion gap: 7 (ref 5–15)
BUN: 48 mg/dL — ABNORMAL HIGH (ref 6–20)
CO2: 19 mmol/L — ABNORMAL LOW (ref 22–32)
Calcium: 7.1 mg/dL — ABNORMAL LOW (ref 8.9–10.3)
Chloride: 109 mmol/L (ref 101–111)
Creatinine, Ser: 1.99 mg/dL — ABNORMAL HIGH (ref 0.44–1.00)
GFR calc Af Amer: 28 mL/min — ABNORMAL LOW (ref >60)
GFR calc non Af Amer: 24 mL/min — ABNORMAL LOW (ref >60)
Glucose, Bld: 128 mg/dL — ABNORMAL HIGH (ref 65–99)
Potassium: 4.9 mmol/L (ref 3.5–5.1)
Sodium: 135 mmol/L (ref 135–145)

## 2016-05-08 LAB — HEPATIC FUNCTION PANEL
ALBUMIN: 2.1 g/dL — AB (ref 3.5–5.0)
ALT: 82 U/L — ABNORMAL HIGH (ref 14–54)
AST: 147 U/L — ABNORMAL HIGH (ref 15–41)
Alkaline Phosphatase: 576 U/L — ABNORMAL HIGH (ref 38–126)
BILIRUBIN TOTAL: 1 mg/dL (ref 0.3–1.2)
Bilirubin, Direct: 0.4 mg/dL (ref 0.1–0.5)
Indirect Bilirubin: 0.6 mg/dL (ref 0.3–0.9)
Total Protein: 4.7 g/dL — ABNORMAL LOW (ref 6.5–8.1)

## 2016-05-08 LAB — URINALYSIS COMPLETE WITH MICROSCOPIC (ARMC ONLY)
BILIRUBIN URINE: NEGATIVE
Glucose, UA: NEGATIVE mg/dL
Ketones, ur: NEGATIVE mg/dL
NITRITE: NEGATIVE
PH: 5 (ref 5.0–8.0)
Protein, ur: 30 mg/dL — AB
SPECIFIC GRAVITY, URINE: 1.018 (ref 1.005–1.030)

## 2016-05-08 LAB — MAGNESIUM: Magnesium: 1.9 mg/dL (ref 1.7–2.4)

## 2016-05-08 MED ORDER — ALBUMIN HUMAN 25 % IV SOLN
25.0000 g | Freq: Two times a day (BID) | INTRAVENOUS | Status: DC
  Administered 2016-05-08 – 2016-05-10 (×4): 25 g via INTRAVENOUS
  Filled 2016-05-08 (×5): qty 100

## 2016-05-08 MED ORDER — CALCIUM CARBONATE ANTACID 500 MG PO CHEW
400.0000 mg | CHEWABLE_TABLET | Freq: Three times a day (TID) | ORAL | Status: DC
  Administered 2016-05-08 – 2016-05-11 (×7): 400 mg via ORAL
  Filled 2016-05-08 (×9): qty 2

## 2016-05-08 MED ORDER — ENSURE PO LIQD
1.0000 | Freq: Three times a day (TID) | ORAL | Status: DC
  Filled 2016-05-08: qty 250

## 2016-05-08 MED ORDER — PANTOPRAZOLE SODIUM 40 MG PO TBEC
40.0000 mg | DELAYED_RELEASE_TABLET | Freq: Every day | ORAL | Status: DC
  Administered 2016-05-08 – 2016-05-11 (×4): 40 mg via ORAL
  Filled 2016-05-08 (×4): qty 1

## 2016-05-08 MED ORDER — ONDANSETRON HCL 4 MG/2ML IJ SOLN
4.0000 mg | Freq: Four times a day (QID) | INTRAMUSCULAR | Status: DC | PRN
  Administered 2016-05-08: 23:00:00 4 mg via INTRAVENOUS
  Filled 2016-05-08: qty 2

## 2016-05-08 MED ORDER — BUDESONIDE 3 MG PO CPEP
9.0000 mg | ORAL_CAPSULE | ORAL | Status: DC
  Administered 2016-05-09 – 2016-05-11 (×3): 9 mg via ORAL
  Filled 2016-05-08 (×3): qty 3

## 2016-05-08 MED ORDER — RISAQUAD PO CAPS
1.0000 | ORAL_CAPSULE | Freq: Two times a day (BID) | ORAL | Status: DC
  Administered 2016-05-08 – 2016-05-11 (×6): 1 via ORAL
  Filled 2016-05-08 (×6): qty 1

## 2016-05-08 MED ORDER — HEPARIN SODIUM (PORCINE) 5000 UNIT/ML IJ SOLN
5000.0000 [IU] | Freq: Three times a day (TID) | INTRAMUSCULAR | Status: DC
  Administered 2016-05-08 – 2016-05-11 (×8): 5000 [IU] via SUBCUTANEOUS
  Filled 2016-05-08 (×8): qty 1

## 2016-05-08 MED ORDER — SODIUM CHLORIDE 0.9 % IV SOLN
INTRAVENOUS | Status: DC
  Administered 2016-05-08 – 2016-05-09 (×2): via INTRAVENOUS

## 2016-05-08 MED ORDER — MAGNESIUM OXIDE 400 (241.3 MG) MG PO TABS
400.0000 mg | ORAL_TABLET | Freq: Two times a day (BID) | ORAL | Status: DC
  Administered 2016-05-08 – 2016-05-11 (×6): 400 mg via ORAL
  Filled 2016-05-08 (×6): qty 1

## 2016-05-08 MED ORDER — ACETAMINOPHEN-CODEINE #3 300-30 MG PO TABS
1.0000 | ORAL_TABLET | Freq: Three times a day (TID) | ORAL | Status: DC
  Administered 2016-05-08 – 2016-05-09 (×2): 1 via ORAL
  Filled 2016-05-08 (×3): qty 1

## 2016-05-08 NOTE — Progress Notes (Signed)
Pt transported to US and xray.

## 2016-05-08 NOTE — Progress Notes (Signed)
Called and spoke to nurse Danielle on 1c and gave her report that pt has had fluids vis port Thursday at cancer center and Friday through yest with home health.  She has swelling of ext more on right than left and corcoran concerned about poss. Clot, she did pass this info to Dr. Mar Daring, also her wbc is elevated and the air movement was not good per corcoran and she also shared this info with dr Mar Daring.  She is very weak, no energy, not eating well but drinking good. Creat is elevated but not as much as last 3 hospitalizations. Dr Mike Gip feels that due to the not eating good-failure to thrive.

## 2016-05-08 NOTE — H&P (Signed)
[facility] NAME: [name]    MR#:  [id number]  DATE OF BIRTH:  08-Aug-1946  DATE OF ADMISSION:  05/08/2016  PRIMARY CARE PHYSICIAN: [name], MD   REQUESTING/REFERRING PHYSICIAN: Dr. Nolon Stalls  CHIEF COMPLAINT:  No chief complaint on file.   HISTORY OF PRESENT ILLNESS:  [name]  is a 70 y.o. 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 Patient was discharged 1 week ago, with home health. She was hypotensive while in the hospital improving with fluids. She only had minimal ankle edema at the time of discharge. She received IV fluids at the clinic once after discharge and also at home by the home health nursing. Today she woke up and she felt extremely weak, went to the [facility]. Her work indicated elevated white count. But she was also started on budesonide for her colitis as outpatient after her discharge last week. Denies any dysuria, denies any pain, no fevers no chills no nausea or vomiting. Appetite has been extremely poor. States that her dyspnea and orthopnea have been worsening. She has 3+ edema of both lower extremities. Her kidney function is stable. Feels weak overall. Noted to be hypotensive today. Diarrhea seems to be improving overall.  PAST MEDICAL HISTORY:   Past Medical History:  Diagnosis Date  . Cancer [facility]) 2011   endometrial  . Chronic kidney disease    STONES  . Diverticulitis of colon (without mention of hemorrhage)   . History of kidney stones 2011  . Mechanical complication of colostomy and enterostomy   . Obesity, unspecified   . Personal history of tobacco use, presenting  hazards to health     PAST SURGICAL HISTORY:   Past Surgical History:  Procedure Laterality Date  . ABDOMINAL HYSTERECTOMY  2011  . APPENDECTOMY    . CHOLECYSTECTOMY  05/07/14   Incidental during ventral hernia repair.   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 COLON SURGERY  05/20/1999   Drainage of pelvic abscess, end colostomy  . COLON SURGERY  11/15/2010   Revision of colostomy stoma  . COLONOSCOPY  June 2010,03/31/14   Tubular adenoma of the transverse colon, hyperplastic polyps of the sigmoid.  Marland Kitchen COLONOSCOPY WITH PROPOFOL N/A 04/27/2016   Procedure: COLONOSCOPY WITH PROPOFOL;  Surgeon: [name], MD;  Location: [facility] ENDOSCOPY;  Service: Endoscopy;  Laterality: N/A;  . COLOSTOMY  2011  . DILATION AND CURETTAGE OF UTERUS    . HERNIA REPAIR  04/06/14   ventral hernia, tetro rectus Ventralex ST mesh, 20 x 33 cm.   Marland Kitchen HYSTEROSCOPY  2011  . LITHOTRIPSY  2009  . MOLE REMOVAL  2003  . PORTACATH PLACEMENT N/A 03/01/2016   Procedure: INSERTION PORT-A-CATH;  Surgeon: [name], MD;  Location: [facility] ORS;  Service: General;  Laterality: N/A;  . PYELOLITHOTOMY    . skin cancer removal  02/2014  . TUBAL LIGATION      SOCIAL HISTORY:   Social History  Substance Use Topics  . Smoking status: Current Every Day Smoker    Packs/day: 0.25    Years: 15.00    Types: Cigarettes  .  Smokeless tobacco: Never Used  . Alcohol use No    FAMILY HISTORY:   Family History  Problem Relation Age of Onset  . Breast cancer Maternal Grandmother   . Colon cancer Mother   . Cancer Mother     DRUG ALLERGIES:  No Known Allergies  REVIEW OF SYSTEMS:   Review of Systems  Constitutional: Positive for malaise/fatigue. Negative for chills, fever and weight loss.  HENT: Negative for ear discharge, ear pain, hearing loss and nosebleeds.   Eyes: Negative for blurred vision, double vision and photophobia.    Respiratory: Positive for cough and shortness of breath. Negative for hemoptysis and wheezing.   Cardiovascular: Positive for orthopnea and leg swelling. Negative for chest pain and palpitations.  Gastrointestinal: Positive for nausea. Negative for abdominal pain, constipation, diarrhea, heartburn, melena and vomiting.  Genitourinary: Negative for dysuria and urgency.  Musculoskeletal: Negative for back pain and neck pain.  Skin: Negative for rash.  Neurological: Positive for weakness. Negative for dizziness, tingling, tremors, sensory change, speech change, focal weakness and headaches.  Endo/Heme/Allergies: Does not bruise/bleed easily.  Psychiatric/Behavioral: Negative for depression.    MEDICATIONS AT HOME:   Prior to Admission medications   Medication Sig Start Date End Date Taking? Authorizing Provider  acetaminophen-codeine (TYLENOL #3) 300-30 MG tablet Take 1 tablet by mouth 3 (three) times daily. 05/01/16   Gladstone Lighter, MD  acidophilus (RISAQUAD) CAPS capsule Take 1 capsule by mouth 2 (two) times daily. 05/01/16   Gladstone Lighter, MD  budesonide (ENTOCORT EC) 3 MG 24 hr capsule Take 3 capsules (9 mg total) by mouth every morning. 05/02/16   [name], MD  calcium carbonate (TUMS - DOSED IN MG ELEMENTAL CALCIUM) 500 MG chewable tablet Chew 2 tablets (400 mg of elemental calcium total) by mouth 3 (three) times daily with meals. 04/20/16   Fritzi Mandes, MD  ENSURE (ENSURE) Take 1 Can by mouth.    Historical Provider, MD  magnesium oxide (MAG-OX) 400 (241.3 Mg) MG tablet Take 1 tablet (400 mg total) by mouth 2 (two) times daily. 04/20/16   Fritzi Mandes, MD  omeprazole (PRILOSEC) 20 MG capsule Take 1 capsule (20 mg total) by mouth daily. 05/02/16   Lequita Asal, MD  ondansetron (ZOFRAN) 8 MG tablet Take 8 mg by mouth 2 (two) times daily as needed.  02/28/16   Historical Provider, MD  potassium chloride (KLOR-CON) 20 MEQ packet Take 20 mEq by mouth 2 (two) times daily. 05/01/16    Gladstone Lighter, MD  potassium chloride SA (K-DUR,KLOR-CON) 20 MEQ tablet Take 1 tablet (20 mEq total) by mouth 2 (two) times daily. Patient not taking: Reported on 05/08/2016 05/03/16   Lequita Asal, MD  promethazine (PHENERGAN) 25 MG tablet Take 1 tablet (25 mg total) by mouth every 8 (eight) hours as needed for nausea or vomiting. Patient not taking: Reported on 05/08/2016 04/11/16   Lequita Asal, MD      VITAL SIGNS:  Blood pressure (!) 84/41, pulse 83, temperature 97.5 F (36.4 C), temperature source Oral, resp. rate 20, SpO2 96 %.  PHYSICAL EXAMINATION:   Physical Exam  GENERAL:  70 y.o.-year-old patient lying in the bed with no acute distress. Appears extremely weak. EYES: Pupils equal, round, reactive to light and accommodation. No scleral icterus. Extraocular muscles intact.  HEENT: Head atraumatic, normocephalic. Oropharynx and nasopharynx clear.  NECK:  Supple, no jugular venous distention. No thyroid enlargement, no tenderness.  LUNGS: Normal breath sounds bilaterally, no wheezing, rales,rhonchi or crepitation. No use  of accessory muscles of respiration. Decreased bibasilar breath sounds. CARDIOVASCULAR: S1, S2 normal. No murmurs, rubs, or gallops.  ABDOMEN: Soft, nontender, nondistended. Bowel sounds present. No organomegaly or mass.  Colostomy bag in place- formed stool present. EXTREMITIES: No  cyanosis, or clubbing. 3+ lower extremity edema, bruising at right knee and on to the leg- denies any trauma NEUROLOGIC: Cranial nerves II through XII are intact. Muscle strength 5/5 in all extremities. Sensation intact. Gait not checked. Global weakness PSYCHIATRIC: The patient is alert and oriented x 3.  SKIN: No obvious rash, lesion, or ulcer.   LABORATORY PANEL:   CBC  Recent Labs Lab 05/08/16 1107  WBC 19.6*  HGB 8.6*  HCT 26.3*  PLT 241    ------------------------------------------------------------------------------------------------------------------  Chemistries   Recent Labs Lab 05/08/16 1107  NA 135  K 4.9  CL 109  CO2 19*  GLUCOSE 128*  BUN 48*  CREATININE 1.99*  CALCIUM 7.1*  MG 1.9   ------------------------------------------------------------------------------------------------------------------  Cardiac Enzymes No results for input(s): TROPONINI in the last 168 hours. ------------------------------------------------------------------------------------------------------------------  RADIOLOGY:  No results found.  EKG:   Orders placed or performed during the hospital encounter of 04/12/16  . EKG 12-Lead  . EKG 12-Lead  . EKG 12-Lead  . EKG 12-Lead  . EKG    IMPRESSION AND PLAN:   [name]  is a 70 y.o. 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  #1 Anasarca-from hyperalbuminemia. Also has CKD. -Nephrology consulted. Start IV albumin twice a day just like last hospitalization. -Monitor albumin level.  #2 Hypotension-hypovolemic likely. Gentle hydration. -Last admission, patient had high output through her colostomy-that has improved. Actually her octreotide has been discontinued 5 days ago. She is on budesonide at this time. -IV fluids for now and monitor. Also IV albumin.  #3 leukocytosis-patient was started on budesonide 4 days ago. That could be causing elevation of white count. -Rule out other infection and DVT. -Urine analysis and chest x-ray are pending at this time.  #4 endometrial carcinoma-following at the cancer center. Status post 2 cycles of chemotherapy. Due to  multiple issues and frequent hospitalization and weakness, no further chemotherapy has been given so far. -After oncology evaluation this hospitalization, consider palliative care consult  #5 CK D-creatinine at 1.9 and GFR of 24. CK D stage IV. -Better than creatinine from last week. Due to anasarca, will consult nephrology.  #6 DVT prophylaxis-started on subcutaneous heparin. Also Ted's and SCDs.  Physical therapy evaluation   All the records are reviewed and case discussed with ED provider. Management plans discussed with the patient, family and they are in agreement.  CODE STATUS: Full code  TOTAL TIME TAKING CARE OF THIS PATIENT: 50 minutes.    Gladstone Lighter M.D on 05/08/2016 at 3:43 PM  Between 7am to 6pm - Pager - [phone]  After 6pm go to [email or web address] - password [email or web address] [facility]  Office  [phone]  CC: Primary care physician; [name], MD

## 2016-05-08 NOTE — Progress Notes (Signed)
Subjective:  Patient well known to from prior hospitalization. She presents now with recurrent acute renal failure. She was having poor by mouth intake at home. She was recently started on budesonide for her underlying diarrhea. She reports that her diarrhea has improved. She has been started on IV fluid hydration. Albumin is noted to be low as well. Lower extremity edema appears to have increased a bit.   Objective:  Vital signs in last 24 hours:  Temp:  [97.5 F (36.4 C)] 97.5 F (36.4 C) (09/05 1502) Pulse Rate:  [83-90] 83 (09/05 1502) Resp:  [20] 20 (09/05 1502) BP: (84-159)/(41-119) 84/41 (09/05 1502) SpO2:  [96 %] 96 % (09/05 1502) Weight:  [69.1 kg (152 lb 5.4 oz)] 69.1 kg (152 lb 5.4 oz) (09/05 1136)  Weight change:  There were no vitals filed for this visit.  Intake/Output:    Intake/Output Summary (Last 24 hours) at 05/08/16 1900 Last data filed at 05/08/16 1700  Gross per 24 hour  Intake              240 ml  Output                0 ml  Net              240 ml     Physical Exam: General: No acute distress, laying in the bed   HEENT Moist oral mucous membranes   Neck Supple   Pulm/lungs Clear to auscultation bilateral, normal effort  CVS/Heart S1S2 no rubs  Abdomen:  Soft, nontender, nondistended, colostomy in place   Extremities: 2+ b/l LE edema  Neurologic: Alert, oriented x 3, following commands  Skin: Dry skin on b/l LE's          Basic Metabolic Panel:   Recent Labs Lab 05/03/16 0834 05/08/16 1107  NA 136 135  K 3.4* 4.9  CL 101 109  CO2 25 19*  GLUCOSE 180* 128*  BUN 26* 48*  CREATININE 2.45* 1.99*  CALCIUM 8.3* 7.1*  MG 1.7 1.9     CBC:  Recent Labs Lab 05/03/16 0834 05/08/16 1107  WBC 14.6* 19.6*  NEUTROABS 11.0* 16.9*  HGB 9.6* 8.6*  HCT 27.9* 26.3*  MCV 94.6 95.8  PLT 434 241      Microbiology:  No results found for this or any previous visit (from the past 720 hour(s)).  Coagulation Studies: No results  for input(s): LABPROT, INR in the last 72 hours.  Urinalysis: No results for input(s): COLORURINE, LABSPEC, PHURINE, GLUCOSEU, HGBUR, BILIRUBINUR, KETONESUR, PROTEINUR, UROBILINOGEN, NITRITE, LEUKOCYTESUR in the last 72 hours.  Invalid input(s): APPERANCEUR    Imaging: Dg Chest 2 View  Result Date: 05/08/2016 CLINICAL DATA:  Lower extremity swelling and elevated white blood cell count. 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 EXAM: CHEST  2 VIEW COMPARISON:  Chest x-ray of March 26, 2016 FINDINGS: The lungs are adequately inflated. There is no focal infiltrate. No pulmonary parenchymal nodules or masses are observed. There is no pleural effusion. The heart and pulmonary vascularity are normal. There is calcification in the wall of the aortic arch. The Port-A-Cath appliance catheter tip projects over the midportion of the SVC. IMPRESSION: There is no active cardiopulmonary disease. Aortic atherosclerosis. Electronically Signed   By: David  Martinique M.D.   On: 05/08/2016 17:11   US Venous Img Lower Bilateral  Result Date: 05/08/2016 CLINICAL DATA:  70 year old female with lower extremity swelling for the past  2 months EXAM: BILATERAL LOWER EXTREMITY VENOUS DOPPLER ULTRASOUND TECHNIQUE: Gray-scale sonography with graded compression, as well as color Doppler and duplex ultrasound were performed to evaluate the lower extremity deep venous systems from the level of the common femoral vein and including the common femoral, femoral, profunda femoral, popliteal and calf veins including the posterior tibial, peroneal and gastrocnemius veins when visible. The superficial great saphenous vein was also interrogated. Spectral Doppler was utilized to evaluate flow at rest and with distal augmentation maneuvers in the common femoral, femoral and popliteal veins. COMPARISON:  Recent prior lower extremity ultrasound 04/26/2016 FINDINGS:  RIGHT LOWER EXTREMITY Common Femoral Vein: No evidence of thrombus. Normal compressibility, respiratory phasicity and response to augmentation. Saphenofemoral Junction: No evidence of thrombus. Normal compressibility and flow on color Doppler imaging. Profunda Femoral Vein: No evidence of thrombus. Normal compressibility and flow on color Doppler imaging. Femoral Vein: No evidence of thrombus. Normal compressibility, respiratory phasicity and response to augmentation. Popliteal Vein: No evidence of thrombus. Normal compressibility, respiratory phasicity and response to augmentation. Calf Veins: No evidence of thrombus. Normal compressibility and flow on color Doppler imaging. Superficial Great Saphenous Vein: No evidence of thrombus. Normal compressibility and flow on color Doppler imaging. Venous Reflux:  None. Other Findings:  None. LEFT LOWER EXTREMITY Common Femoral Vein: No evidence of thrombus. Normal compressibility, respiratory phasicity and response to augmentation. Saphenofemoral Junction: No evidence of thrombus. Normal compressibility and flow on color Doppler imaging. Profunda Femoral Vein: No evidence of thrombus. Normal compressibility and flow on color Doppler imaging. Femoral Vein: No evidence of thrombus. Normal compressibility, respiratory phasicity and response to augmentation. Popliteal Vein: No evidence of thrombus. Normal compressibility, respiratory phasicity and response to augmentation. Calf Veins: No evidence of thrombus. Normal compressibility and flow on color Doppler imaging. Superficial Great Saphenous Vein: No evidence of thrombus. Normal compressibility and flow on color Doppler imaging. Venous Reflux:  None. Other Findings:  None. IMPRESSION: No evidence of deep venous thrombosis. Electronically Signed   By: Jacqulynn Cadet M.D.   On: 05/08/2016 17:21     Medications:   . sodium chloride 75 mL/hr at 05/08/16 1555   . acetaminophen-codeine  1 tablet Oral TID  . acidophilus   1 capsule Oral BID  . albumin human  25 g Intravenous BID  . [START ON 05/09/2016] budesonide  9 mg Oral [id number]  . calcium carbonate  400 mg of elemental calcium Oral TID WC  . ENSURE  1 Can Oral TID BM  . heparin  5,000 Units Subcutaneous Q8H  . magnesium oxide  400 mg Oral BID  . 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  1.  Acute renal failure on chronic kidney disease stage III: creatinine fluctuates with fluid status.  Creatinine has recently increased likely secondary to decreased PO intake, states colostomy output has actually decreased. -  Agree with initiation of IV fluid hydration with 0.9 normal saline at 75 cc per hour. Despite her increase in lower extremity edema this is the correct course of action at this time.  2.  Increased lower extremity edema. Likely due to decreased oncotic pressure as albumin is down to 2.1. Agree with short-term albumin infusion for now.  3. Anemia of chronic kidney disease. Hemoglobin currently 8.6. Would not  administer Epogen at this time.           LOS: 0 Devonne Lalani 9/5/20177:00 PM

## 2016-05-08 NOTE — Progress Notes (Signed)
Patient has stopped Sandostatin due to not having BM's for 3 days.  Patient very weak today.  Also has bilateral edema in lower extremities. BP elevated - 165/33 HR 90.  Patient states she feels like she cannot catch her breath.  O2 97%

## 2016-05-08 NOTE — Progress Notes (Signed)
   05/08/16 1502  Vitals  Temp 97.5 F (36.4 C)  Temp Source Oral  BP (!) 84/41  MAP (mmHg) (!) 47  BP Location Right Arm  BP Method Automatic  Patient Position (if appropriate) Lying  Pulse Rate 83  Resp 20  Oxygen Therapy  SpO2 96 %  O2 Device Room Air   Dr. Tressia Miners aware of current BP.  Pt is asymptomatic. No new orders at this time.

## 2016-05-09 ENCOUNTER — Inpatient Hospital Stay: Payer: Medicare Other

## 2016-05-09 DIAGNOSIS — E43 Unspecified severe protein-calorie malnutrition: Secondary | ICD-10-CM

## 2016-05-09 DIAGNOSIS — R630 Anorexia: Secondary | ICD-10-CM

## 2016-05-09 LAB — BASIC METABOLIC PANEL
ANION GAP: 7 (ref 5–15)
BUN: 45 mg/dL — AB (ref 6–20)
CALCIUM: 7 mg/dL — AB (ref 8.9–10.3)
CO2: 22 mmol/L (ref 22–32)
Chloride: 111 mmol/L (ref 101–111)
Creatinine, Ser: 1.82 mg/dL — ABNORMAL HIGH (ref 0.44–1.00)
GFR calc Af Amer: 32 mL/min — ABNORMAL LOW (ref >60)
GFR, EST NON AFRICAN AMERICAN: 27 mL/min — AB (ref >60)
GLUCOSE: 87 mg/dL (ref 65–99)
Potassium: 4.6 mmol/L (ref 3.5–5.1)
Sodium: 140 mmol/L (ref 135–145)

## 2016-05-09 LAB — COMPREHENSIVE METABOLIC PANEL
ALK PHOS: 545 U/L — AB (ref 38–126)
ALT: 80 U/L — AB (ref 14–54)
AST: 133 U/L — ABNORMAL HIGH (ref 15–41)
Albumin: 2.7 g/dL — ABNORMAL LOW (ref 3.5–5.0)
Anion gap: 7 (ref 5–15)
BILIRUBIN TOTAL: 1.8 mg/dL — AB (ref 0.3–1.2)
BUN: 46 mg/dL — ABNORMAL HIGH (ref 6–20)
CALCIUM: 7.2 mg/dL — AB (ref 8.9–10.3)
CHLORIDE: 114 mmol/L — AB (ref 101–111)
CO2: 21 mmol/L — ABNORMAL LOW (ref 22–32)
CREATININE: 1.84 mg/dL — AB (ref 0.44–1.00)
GFR, EST AFRICAN AMERICAN: 31 mL/min — AB (ref >60)
GFR, EST NON AFRICAN AMERICAN: 27 mL/min — AB (ref >60)
Glucose, Bld: 105 mg/dL — ABNORMAL HIGH (ref 65–99)
Potassium: 4.6 mmol/L (ref 3.5–5.1)
Sodium: 142 mmol/L (ref 135–145)
TOTAL PROTEIN: 4.6 g/dL — AB (ref 6.5–8.1)

## 2016-05-09 LAB — CBC
HCT: 22.9 % — ABNORMAL LOW (ref 35.0–47.0)
HEMOGLOBIN: 7.7 g/dL — AB (ref 12.0–16.0)
MCH: 32 pg (ref 26.0–34.0)
MCHC: 33.6 g/dL (ref 32.0–36.0)
MCV: 95.2 fL (ref 80.0–100.0)
Platelets: 189 10*3/uL (ref 150–440)
RBC: 2.41 MIL/uL — ABNORMAL LOW (ref 3.80–5.20)
RDW: 17.2 % — AB (ref 11.5–14.5)
WBC: 11.2 10*3/uL — ABNORMAL HIGH (ref 3.6–11.0)

## 2016-05-09 LAB — PHOSPHORUS: PHOSPHORUS: 3.1 mg/dL (ref 2.5–4.6)

## 2016-05-09 MED ORDER — ENSURE ENLIVE PO LIQD
237.0000 mL | Freq: Two times a day (BID) | ORAL | Status: DC
  Administered 2016-05-10 – 2016-05-11 (×3): 237 mL via ORAL

## 2016-05-09 MED ORDER — CHOLESTYRAMINE LIGHT 4 G PO PACK
4.0000 g | PACK | Freq: Two times a day (BID) | ORAL | Status: DC
  Filled 2016-05-09: qty 1

## 2016-05-09 MED ORDER — DEXTROSE 5 % IV SOLN
1.0000 g | INTRAVENOUS | Status: DC
  Administered 2016-05-09 – 2016-05-11 (×3): 1 g via INTRAVENOUS
  Filled 2016-05-09 (×3): qty 10

## 2016-05-09 MED ORDER — SODIUM CHLORIDE 0.9 % IV SOLN
1.0000 g | Freq: Once | INTRAVENOUS | Status: AC
  Administered 2016-05-09: 17:00:00 1 g via INTRAVENOUS
  Filled 2016-05-09: qty 10

## 2016-05-09 MED ORDER — MIDODRINE HCL 5 MG PO TABS
5.0000 mg | ORAL_TABLET | Freq: Three times a day (TID) | ORAL | Status: DC
  Administered 2016-05-09 – 2016-05-10 (×3): 5 mg via ORAL
  Filled 2016-05-09 (×3): qty 1

## 2016-05-09 NOTE — Progress Notes (Signed)
Initial Nutrition Assessment  DOCUMENTATION CODES:   Severe malnutrition in context of chronic illness  INTERVENTION:  -Monitor intake and cater to pt preferences -Recommend Ensure Enlive po BID, each supplement provides 350 kcal and 20 grams of protein -Left education materials at bedside regarding nutrition therapy for nausea, vomiting, diarrhea.  Pt sleepy, not feeling well this am to discuss.   NUTRITION DIAGNOSIS:   Malnutrition related to cancer and cancer related treatments, poor appetite, chronic illness as evidenced by percent weight loss, energy intake < or equal to 75% for > or equal to 1 month.    GOAL:   Patient will meet greater than or equal to 90% of their needs    MONITOR:   PO intake, Supplement acceptance, Labs, Weight trends  REASON FOR ASSESSMENT:    (FTT)    ASSESSMENT:      Pt admitted with weakness, nausea, vomiting, diarrhea, ARF. Pt s/p 2 rounds of chemotherapy for endometrial cancer. Nephrology following.  Pt also has colostomy from diverticulitis and CKD  Pt with short answers this am. Reports poor po intake for months maybe 4-69months. Reports wt loss but not sure how much.    Medications reviewed: albumin, tums, Mag ox Labs reviewed: BUn 45, creatinine 1.82  Nutrition-Focused physical exam completed. Findings are no fat depletion, no muscle depletion, and moderate edema.    Diet Order:  Diet regular Room service appropriate? Yes; Fluid consistency: Thin  Skin:  Reviewed, no issues  Last BM:  9/5 via colostomy  Height:   Ht Readings from Last 1 Encounters:  05/08/16 5\' 2"  (1.575 m)    Weight:  Noted wt on 8/10 169 pounds (6% wt loss in the last month)  Wt Readings from Last 1 Encounters:  05/08/16 159 lb (72.1 kg)    Ideal Body Weight:     BMI:  Body mass index is 29.08 kg/m.  Estimated Nutritional Needs:   Kcal:  [id number] kcals/d  Protein:  86-108 g/d  Fluid:  >/= 2 L  EDUCATION NEEDS:   Education needs  addressed  Sekou Zuckerman B. Zenia Resides, [place], [place] (pager) Weekend/On-Call pager [phone])

## 2016-05-09 NOTE — Progress Notes (Addendum)
[facility] [place] NAME: [name]    MR#:  [id number]  DATE OF BIRTH:  07-26-1946  SUBJECTIVE:  CHIEF COMPLAINT:  No chief complaint on file.  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 This chronic diarrhea, CK D, who presents to the hospital with complaints of elevated white blood cell count and worsening lower extremity swelling, generalized weakness, she was again noted to be hypotensive, however, denied nausea, vomiting, admitted of poor appetite, admitted of orthopnea and shortness of breath. On arrival to the hospital, the patient's blood pressure was noted to be low, labs revealed renal failure with creatinine of 1.99, elevated transaminase level, white the cell count. Patient is admitted to the hospital for further evaluation and treatment, she was initiated on IV fluids, and her hemoglobin level dropped down to 7.7. Review of Systems  Constitutional: Positive for malaise/fatigue and weight loss. Negative for chills and fever.  HENT: Negative for congestion.   Eyes: Negative for blurred vision and double vision.  Respiratory: Negative for cough, sputum production, shortness of breath and wheezing.   Cardiovascular: Positive for leg swelling. Negative for chest pain, palpitations, orthopnea and PND.  Gastrointestinal: Negative for abdominal pain, blood in stool, constipation, diarrhea, nausea and vomiting.  Genitourinary: Negative for dysuria, frequency, hematuria and urgency.  Musculoskeletal: Negative for falls.  Neurological: Negative for dizziness, tremors, focal weakness and headaches.  Endo/Heme/Allergies: Does not bruise/bleed easily.  Psychiatric/Behavioral: Negative for depression. The patient does not have insomnia.     VITAL SIGNS: Blood pressure (!) 97/58, pulse 86,  temperature 97.7 F (36.5 C), temperature source Oral, resp. rate 18, height 5\' 2"  (1.575 m), weight 72.1 kg (159 lb), SpO2 97 %.  PHYSICAL EXAMINATION:   GENERAL:  70 y.o.-year-old patient lying in the bed with no acute distress.  EYES: Pupils equal, round, reactive to light and accommodation. No scleral icterus. Extraocular muscles intact.  HEENT: Head atraumatic, normocephalic. Oropharynx and nasopharynx clear.  NECK:  Supple, no jugular venous distention. No thyroid enlargement, no tenderness.  LUNGS: Diminished breath sounds bilaterally at bases, no wheezing, rales,rhonchi or crepitation. No use of accessory muscles of respiration.  CARDIOVASCULAR: S1, S2 normal. No murmurs, rubs, or gallops.  ABDOMEN: Soft, nontender, nondistended. Bowel sounds present. No organomegaly or mass.  EXTREMITIES: 1-2+ lower extremity and pedal edema, no cyanosis, or clubbing.  NEUROLOGIC: Cranial nerves II through XII are intact. Muscle strength 5/5 in all extremities. Sensation intact. Gait not checked.  PSYCHIATRIC: The patient is alert and oriented x 3.  SKIN: No obvious rash, lesion, or ulcer.   ORDERS/RESULTS REVIEWED:   CBC  Recent Labs Lab 05/03/16 0834 05/08/16 1107 05/09/16 0436  WBC 14.6* 19.6* 11.2*  HGB 9.6* 8.6* 7.7*  HCT 27.9* 26.3* 22.9*  PLT 434 241 189  MCV 94.6 95.8 95.2  MCH 32.6 31.4 32.0  MCHC 34.5 32.7 33.6  RDW 16.6* 17.6* 17.2*  LYMPHSABS 1.8 1.4  --   MONOABS 1.6* 1.1*  --   EOSABS 0.0 0.0  --   BASOSABS 0.1 0.1  --    ------------------------------------------------------------------------------------------------------------------  Chemistries   Recent Labs Lab 05/03/16 0834 05/08/16 1107 05/09/16 0436  NA 136 135 140  K 3.4* 4.9 4.6  CL 101 109 111  CO2 25 19* 22  GLUCOSE 180* 128* 87  BUN 26* 48* 45*  CREATININE 2.45* 1.99*  1.82*  CALCIUM 8.3* 7.1* 7.0*  MG 1.7 1.9  --   AST  --  147*  --   ALT  --  82*  --   ALKPHOS  --  576*  --   BILITOT  --   1.0  --    ------------------------------------------------------------------------------------------------------------------ estimated creatinine clearance is 27.1 mL/min (by C-G formula based on SCr of 1.82 mg/dL). ------------------------------------------------------------------------------------------------------------------ No results for input(s): TSH, T4TOTAL, T3FREE, THYROIDAB in the last 72 hours.  Invalid input(s): FREET3  Cardiac Enzymes No results for input(s): CKMB, TROPONINI, MYOGLOBIN in the last 168 hours.  Invalid input(s): CK ------------------------------------------------------------------------------------------------------------------ Invalid input(s): POCBNP ---------------------------------------------------------------------------------------------------------------  RADIOLOGY: Dg Chest 2 View  Result Date: 05/08/2016 CLINICAL DATA:  Lower extremity swelling and elevated white blood cell count. 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 EXAM: CHEST  2 VIEW COMPARISON:  Chest x-ray of March 26, 2016 FINDINGS: The lungs are adequately inflated. There is no focal infiltrate. No pulmonary parenchymal nodules or masses are observed. There is no pleural effusion. The heart and pulmonary vascularity are normal. There is calcification in the wall of the aortic arch. The Port-A-Cath appliance catheter tip projects over the midportion of the SVC. IMPRESSION: There is no active cardiopulmonary disease. Aortic atherosclerosis. Electronically Signed   By: David  Martinique M.D.   On: 05/08/2016 17:11   US Venous Img Lower Bilateral  Result Date: 05/08/2016 CLINICAL DATA:  70 year old female with lower extremity swelling for the past 2 months EXAM: BILATERAL LOWER EXTREMITY VENOUS DOPPLER ULTRASOUND TECHNIQUE: Gray-scale sonography with graded compression, as well as color Doppler and duplex ultrasound were  performed to evaluate the lower extremity deep venous systems from the level of the common femoral vein and including the common femoral, femoral, profunda femoral, popliteal and calf veins including the posterior tibial, peroneal and gastrocnemius veins when visible. The superficial great saphenous vein was also interrogated. Spectral Doppler was utilized to evaluate flow at rest and with distal augmentation maneuvers in the common femoral, femoral and popliteal veins. COMPARISON:  Recent prior lower extremity ultrasound 04/26/2016 FINDINGS: RIGHT LOWER EXTREMITY Common Femoral Vein: No evidence of thrombus. Normal compressibility, respiratory phasicity and response to augmentation. Saphenofemoral Junction: No evidence of thrombus. Normal compressibility and flow on color Doppler imaging. Profunda Femoral Vein: No evidence of thrombus. Normal compressibility and flow on color Doppler imaging. Femoral Vein: No evidence of thrombus. Normal compressibility, respiratory phasicity and response to augmentation. Popliteal Vein: No evidence of thrombus. Normal compressibility, respiratory phasicity and response to augmentation. Calf Veins: No evidence of thrombus. Normal compressibility and flow on color Doppler imaging. Superficial Great Saphenous Vein: No evidence of thrombus. Normal compressibility and flow on color Doppler imaging. Venous Reflux:  None. Other Findings:  None. LEFT LOWER EXTREMITY Common Femoral Vein: No evidence of thrombus. Normal compressibility, respiratory phasicity and response to augmentation. Saphenofemoral Junction: No evidence of thrombus. Normal compressibility and flow on color Doppler imaging. Profunda Femoral Vein: No evidence of thrombus. Normal compressibility and flow on color Doppler imaging. Femoral Vein: No evidence of thrombus. Normal compressibility, respiratory phasicity and response to augmentation. Popliteal Vein: No evidence of thrombus. Normal compressibility, respiratory  phasicity and response to augmentation. Calf Veins: No evidence of thrombus. Normal compressibility and flow on color Doppler imaging. Superficial Great Saphenous Vein: No evidence of thrombus. Normal compressibility and flow on color Doppler imaging. Venous Reflux:  None. Other Findings:  None. IMPRESSION: No evidence of deep venous thrombosis. Electronically Signed  By: Jacqulynn Cadet M.D.   On: 05/08/2016 17:21    EKG:  Orders placed or performed during the hospital encounter of 04/12/16  . EKG 12-Lead  . EKG 12-Lead  . EKG 12-Lead  . EKG 12-Lead  . EKG    ASSESSMENT AND PLAN:  Active Problems:   Anasarca   Protein-calorie malnutrition, severe #1. Anasarca, likely due to protein calorie malnutrition, hypoalbuminemia. Stop IV fluids. Initiate patient on midodrine, Lasix if blood pressure tolerates #2. Leukocytosis, concerning due to urinary tract infection, follow with antibiotic therapy #3. Pyuria, concerning for urinary tract infection, urine cultures are taken, initiate patient on Rocephin, awaiting for culture results #4 hypotension, initiate midodrine #5 elevated transaminases, get right upper quadrant abdominal ultrasound #6 diarrhea, initiate cholestyramine #7. Failure to thrive adult, malnutrition, follow patient's oral intake, may need to initiate appetite stimulants #8 anemia. With rehydration, follow closely, get Hemoccult, transfuse patient as needed #9. Anorexia, get CT scan of abdomen and pelvis to rule out partial small bowel obstruction. Discussed with  the patient's husband extensively Management plans discussed with the patient, family and they are in agreement.   DRUG ALLERGIES: No Known Allergies  CODE STATUS:     Code Status Orders        Start     Ordered   05/08/16 1539  Full code  Continuous     05/08/16 1540    Code Status History    Date Active Date Inactive Code Status Order ID Comments User Context   04/25/2016  3:50 PM 05/01/2016  2:25 PM  Full Code [id number]  Fritzi Mandes, MD Inpatient   04/12/2016 12:39 PM 04/20/2016 10:09 PM Full Code [id number]  [name], MD ED   03/25/2016  7:41 PM 03/28/2016  4:21 PM Full Code [id number]  Baxter Hire, MD Inpatient   03/18/2016 11:46 AM 03/23/2016  7:42 PM Full Code [id number]  Lytle Butte, MD ED      TOTAL TIME TAKING CARE OF THIS PATIENT: 40 minutes.  Discussed with the patient's husband  Eleanor Gatliff M.D on 05/09/2016 at 10:34 AM  Between 7am to 6pm - Pager - [phone]  After 6pm go to [email or web address] - password [email or web address] [facility]  [facility]  Office  [phone]  CC: Primary care physician; [name], MD

## 2016-05-09 NOTE — Care Management Important Message (Signed)
Important Message  Patient Details  Name: [name] MRN: [id number] Date of Birth: [birth year]   Medicare Important Message Given:  Yes    [name], RN 05/09/2016, 8:33 AM

## 2016-05-09 NOTE — Consult Note (Signed)
Patient ID: [name], female   DOB: June 20, 1946, 70 y.o.   MRN: [id number]  CC: ANOREXIA  HPI [name] is a 70 y.o. female with multiple problems currently admitted to the medical service. Surgery consult requested by Dr.Vaickute for possible partial small bowel obstruction. Patient has a history of recurrent metastatic endometrial cancer and is currently admitted for lower extremity edema secondary to poor nutrition with a concurrent leukocytosis. Patient also has chronic renal insufficiency and currently is dealing with failure to thrive. At the present she denies any abdominal pain, nausea, vomiting. She does have shortness of breath and generalized fatigue. She denies any fevers, chills, chest pain. She's had a recent diarrhea that is currently being treated with budesonide and has recently been on octreotide. She has been evaluated by GI on numerous accounts in the last several weeks. Patient has only been able to tolerate 2 rounds of chemotherapy secondary to her poor functional status. It is hoped that her functional status will improve with improved nutrition and fluid maintenance that within allow her to continue with her chemotherapy. Patient also has current concern for a urinary tract infection. Patient states that she is very tired of being sick and pray she does not get any worse.  HPI  Past Medical History:  Diagnosis Date  . Cancer [facility]) 2011   endometrial  . Chronic kidney disease    STONES  . Diverticulitis of colon (without mention of hemorrhage)   . History of kidney stones 2011  . Mechanical complication of colostomy and enterostomy   . Obesity, unspecified   . Personal history of tobacco use, presenting hazards to health     Past Surgical History:  Procedure Laterality Date  . ABDOMINAL HYSTERECTOMY  2011  . APPENDECTOMY    . CHOLECYSTECTOMY  05/07/14   Incidental during ventral hernia repair.   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 COLON SURGERY  05/20/1999   Drainage of pelvic abscess, end colostomy  . COLON SURGERY  11/15/2010   Revision of colostomy stoma  . COLONOSCOPY  June 2010,03/31/14   Tubular adenoma of the transverse colon, hyperplastic polyps of the sigmoid.  Marland Kitchen COLONOSCOPY WITH PROPOFOL N/A 04/27/2016   Procedure: COLONOSCOPY WITH PROPOFOL;  Surgeon: [name], MD;  Location: [facility] ENDOSCOPY;  Service: Endoscopy;  Laterality: N/A;  . COLOSTOMY  2011  . DILATION AND CURETTAGE OF UTERUS    . HERNIA REPAIR  04/06/14   ventral hernia, tetro rectus Ventralex ST mesh, 20 x 33 cm.   [facility] Kitchen HYSTEROSCOPY  2011  . LITHOTRIPSY  2009  . MOLE REMOVAL  2003  . PORTACATH PLACEMENT N/A 03/01/2016   Procedure: INSERTION PORT-A-CATH;  Surgeon: [name], MD;  Location: [facility] ORS;  Service: General;  Laterality: N/A;  . PYELOLITHOTOMY    . skin cancer removal  02/2014  . TUBAL LIGATION      Family History  Problem Relation Age of Onset  . Breast cancer Maternal Grandmother   . Colon cancer Mother   . Cancer Mother     Social History Social History  Substance Use Topics  . Smoking status: Current Every Day Smoker    Packs/day: 0.25    Years: 15.00    Types: Cigarettes  . Smokeless tobacco: Never Used  . Alcohol use No    No Known Allergies  Current Facility-Administered Medications  Medication Dose Route Frequency Provider Last Rate Last  Dose  . acetaminophen-codeine (TYLENOL #3) 300-30 MG per tablet 1 tablet  1 tablet Oral TID Gladstone Lighter, MD   1 tablet at 05/09/16 0251  . acidophilus (RISAQUAD) capsule 1 capsule  1 capsule Oral BID Gladstone Lighter, MD   1 capsule at 05/09/16 1019  . albumin human 25 % solution 25 g  25 g Intravenous BID Gladstone Lighter, MD   25 g at 05/09/16 0439  . budesonide (ENTOCORT EC) 24 hr capsule 9 mg  9 mg Oral Maury Dus, MD   9 mg at 05/09/16 1019  . calcium carbonate (TUMS - dosed in  mg elemental calcium) chewable tablet 400 mg of elemental calcium  400 mg of elemental calcium Oral TID WC Gladstone Lighter, MD   400 mg of elemental calcium at 05/09/16 0831  . calcium gluconate 1 g in sodium chloride 0.9 % 100 mL IVPB  1 g Intravenous Once Theodoro Grist, MD      . cefTRIAXone (ROCEPHIN) 1 g in dextrose 5 % 50 mL IVPB  1 g Intravenous Q24H Theodoro Grist, MD   1 g at 05/09/16 1308  . feeding supplement (ENSURE ENLIVE) (ENSURE ENLIVE) liquid 237 mL  237 mL Oral BID BM Theodoro Grist, MD      . heparin injection 5,000 Units  5,000 Units Subcutaneous Q8H Gladstone Lighter, MD   5,000 Units at 05/09/16 1308  . magnesium oxide (MAG-OX) tablet 400 mg  400 mg Oral BID Gladstone Lighter, MD   400 mg at 05/09/16 1019  . midodrine (PROAMATINE) tablet 5 mg  5 mg Oral TID WC Theodoro Grist, MD   5 mg at 05/09/16 1019  . ondansetron (ZOFRAN) injection 4 mg  4 mg Intravenous Q6H PRN Alexis Hugelmeyer, DO   4 mg at 05/08/16 2242  . pantoprazole (PROTONIX) EC tablet 40 mg  40 mg Oral Daily Gladstone Lighter, MD   40 mg at 05/09/16 1019   Facility-Administered Medications Ordered in Other Encounters  Medication Dose Route Frequency Provider Last Rate Last Dose  . magnesium sulfate IVPB 2 g 50 mL  2 g Intravenous Once Lequita Asal, MD         Review of Systems A Multi-point review of systems was asked and was negative except for the findings documented in the history of present illness  Physical Exam Blood pressure (!) 103/50, pulse 77, temperature 97.7 F (36.5 C), temperature source Axillary, resp. rate 20, height 5\' 2"  (1.575 m), weight 72.1 kg (159 lb), SpO2 100 %. CONSTITUTIONAL: Resting in bed in no acute distress. EYES: Pupils are equal, round, and reactive to light, Sclera are non-icteric. EARS, NOSE, MOUTH AND THROAT: The oropharynx is clear. The oral mucosa is pink and moist. Hearing is intact to voice. LYMPH NODES:  Lymph nodes in the neck are normal. RESPIRATORY:  Lungs are  clear. There is normal respiratory effort, with equal breath sounds bilaterally, and without pathologic use of accessory muscles. CARDIOVASCULAR: Heart is regular without murmurs, gallops, or rubs. GI: The abdomen is soft, nontender, and nondistended. There are no palpable masses. There is no hepatosplenomegaly. There are normal bowel sounds in all quadrants. Colostomy bag is in place with stool in the bag. GU: Rectal deferred.   MUSCULOSKELETAL: Moves all extremities well. Extensive lower extremity edema and evidence of bruising on the right leg.   SKIN: Turgor is good and there are no obvious skin lesions or ulcers. NEUROLOGIC: Motor and sensation is grossly normal. Cranial nerves are grossly intact. PSYCH:  Oriented to person, place and time. Affect is normal.  Data Reviewed Images and labs reviewed with labs concerning for a leukocytosis of 11.2, and anemia of 7. 7/20 2.9, elevated liver function tests with an alkaline phosphatase of 545, AST of 133, ALT of 80, bilirubin of 1.8. Hypoalbuminemia at 2.7, hypo-calcium is 7.2. Patient also has elevated creatinine of 1.84 and elevated BUN of 46. Urinalysis shows evidence of bacteria. Upon review her CT scan there are numerous findings of abnormalities including an abnormal appearing liver and kidneys. She does have some segments of small bowel that are dilated but is not markedly different from the CT that was performed 3 months ago. There is no proximal small bowel dilatation there is no stomach dilatation and there is evidence of passage of contents throughout the entirety of the colon. There is evidence of her metastatic cancer in terms of lymph nodes and fluid within the abdomen either from her poor nutritional status or from her cancer. I have personally reviewed the patient's imaging, laboratory findings and medical records.    Assessment    70 year old female with anorexia    Plan    70 year old female with recurrent metastatic endometrial  cancer and multiple medical problems with anorexia. Although her CT scan is compatible with the possibility of a partial small bowel obstruction her history and physical do not. She has no abdominal pain, nausea, vomiting. She has been tolerating a diet but not taking much of a diet. She is having ostomy output. Given her numerous other medical and oncologic problems there is no current indication for any surgical intervention in this patient. Given the patient's poor nutritional and functional status she is not a candidate for any elective surgical intervention. In the event that she may require an emergent surgical intervention her poor nutritional status would make her recovery difficult at best. Patient may benefit from parenteral nutrition if she is unable to achieve an adequate oral diet with the use of supplementation. Discussed with the patient that there are no plans for any surgical intervention for her and she voiced understanding. Please call again if surgery services can be of any further assistance in the care of this patient.     Time spent with the patient was 80 minutes, with more than 50% of the time spent in face-to-face education, counseling and care coordination.     Clayburn Pert, MD FACS General Surgeon 05/09/2016, 4:28 PM

## 2016-05-09 NOTE — Progress Notes (Signed)
Subjective:  Creatinine currently down to 1.8.  nursing has some concerns that the patient is unable to drink adequately due to oral muscular weakness.   Objective:  Vital signs in last 24 hours:  Temp:  [97.5 F (36.4 C)-98 F (36.7 C)] 97.7 F (36.5 C) (09/06 0930) Pulse Rate:  [78-86] 86 (09/06 0930) Resp:  [17-20] 18 (09/06 0930) BP: (84-107)/(41-60) 97/58 (09/06 0930) SpO2:  [93 %-97 %] 97 % (09/06 0400) Weight:  [72.1 kg (159 lb)] 72.1 kg (159 lb) (09/05 2030)  Weight change:  Filed Weights   05/08/16 2030  Weight: 72.1 kg (159 lb)    Intake/Output:    Intake/Output Summary (Last 24 hours) at 05/09/16 1149 Last data filed at 05/09/16 0953  Gross per 24 hour  Intake          1171.25 ml  Output               75 ml  Net          1096.25 ml     Physical Exam: General: No acute distress, laying in the bed   HEENT Moist oral mucous membranes   Neck Supple   Pulm/lungs Clear to auscultation bilateral, normal effort  CVS/Heart S1S2 no rubs  Abdomen:  Soft, nontender, nondistended, colostomy in place   Extremities: 2+ b/l LE edema  Neurologic: Alert, oriented x 3, following commands  Skin: Dry skin on b/l LE's          Basic Metabolic Panel:   Recent Labs Lab 05/03/16 0834 05/08/16 1107 05/09/16 0436  NA 136 135 140  K 3.4* 4.9 4.6  CL 101 109 111  CO2 25 19* 22  GLUCOSE 180* 128* 87  BUN 26* 48* 45*  CREATININE 2.45* 1.99* 1.82*  CALCIUM 8.3* 7.1* 7.0*  MG 1.7 1.9  --      CBC:  Recent Labs Lab 05/03/16 0834 05/08/16 1107 05/09/16 0436  WBC 14.6* 19.6* 11.2*  NEUTROABS 11.0* 16.9*  --   HGB 9.6* 8.6* 7.7*  HCT 27.9* 26.3* 22.9*  MCV 94.6 95.8 95.2  PLT 434 241 189      Microbiology:  No results found for this or any previous visit (from the past 720 hour(s)).  Coagulation Studies: No results for input(s): LABPROT, INR in the last 72 hours.  Urinalysis:  Recent Labs  05/08/16 1859  COLORURINE YELLOW*  LABSPEC 1.018   PHURINE 5.0  GLUCOSEU NEGATIVE  HGBUR 3+*  BILIRUBINUR NEGATIVE  KETONESUR NEGATIVE  PROTEINUR 30*  NITRITE NEGATIVE  LEUKOCYTESUR 2+*      Imaging: Dg Chest 2 View  Result Date: 05/08/2016 CLINICAL DATA:  Lower extremity swelling and elevated white blood cell count. 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 EXAM: CHEST  2 VIEW COMPARISON:  Chest x-ray of March 26, 2016 FINDINGS: The lungs are adequately inflated. There is no focal infiltrate. No pulmonary parenchymal nodules or masses are observed. There is no pleural effusion. The heart and pulmonary vascularity are normal. There is calcification in the wall of the aortic arch. The Port-A-Cath appliance catheter tip projects over the midportion of the SVC. IMPRESSION: There is no active cardiopulmonary disease. Aortic atherosclerosis. Electronically Signed   By: David  Martinique M.D.   On: 05/08/2016 17:11   US Venous Img Lower Bilateral  Result Date: 05/08/2016 CLINICAL DATA:  70 year old female with lower extremity swelling for the past 2 months EXAM: BILATERAL LOWER EXTREMITY VENOUS DOPPLER ULTRASOUND  TECHNIQUE: Gray-scale sonography with graded compression, as well as color Doppler and duplex ultrasound were performed to evaluate the lower extremity deep venous systems from the level of the common femoral vein and including the common femoral, femoral, profunda femoral, popliteal and calf veins including the posterior tibial, peroneal and gastrocnemius veins when visible. The superficial great saphenous vein was also interrogated. Spectral Doppler was utilized to evaluate flow at rest and with distal augmentation maneuvers in the common femoral, femoral and popliteal veins. COMPARISON:  Recent prior lower extremity ultrasound 04/26/2016 FINDINGS: RIGHT LOWER EXTREMITY Common Femoral Vein: No evidence of thrombus. Normal compressibility, respiratory phasicity and  response to augmentation. Saphenofemoral Junction: No evidence of thrombus. Normal compressibility and flow on color Doppler imaging. Profunda Femoral Vein: No evidence of thrombus. Normal compressibility and flow on color Doppler imaging. Femoral Vein: No evidence of thrombus. Normal compressibility, respiratory phasicity and response to augmentation. Popliteal Vein: No evidence of thrombus. Normal compressibility, respiratory phasicity and response to augmentation. Calf Veins: No evidence of thrombus. Normal compressibility and flow on color Doppler imaging. Superficial Great Saphenous Vein: No evidence of thrombus. Normal compressibility and flow on color Doppler imaging. Venous Reflux:  None. Other Findings:  None. LEFT LOWER EXTREMITY Common Femoral Vein: No evidence of thrombus. Normal compressibility, respiratory phasicity and response to augmentation. Saphenofemoral Junction: No evidence of thrombus. Normal compressibility and flow on color Doppler imaging. Profunda Femoral Vein: No evidence of thrombus. Normal compressibility and flow on color Doppler imaging. Femoral Vein: No evidence of thrombus. Normal compressibility, respiratory phasicity and response to augmentation. Popliteal Vein: No evidence of thrombus. Normal compressibility, respiratory phasicity and response to augmentation. Calf Veins: No evidence of thrombus. Normal compressibility and flow on color Doppler imaging. Superficial Great Saphenous Vein: No evidence of thrombus. Normal compressibility and flow on color Doppler imaging. Venous Reflux:  None. Other Findings:  None. IMPRESSION: No evidence of deep venous thrombosis. Electronically Signed   By: Jacqulynn Cadet M.D.   On: 05/08/2016 17:21     Medications:     . acetaminophen-codeine  1 tablet Oral TID  . acidophilus  1 capsule Oral BID  . albumin human  25 g Intravenous BID  . budesonide  9 mg Oral BH-q7a  . calcium carbonate  400 mg of elemental calcium Oral TID WC  .  cefTRIAXone (ROCEPHIN)  IV  1 g Intravenous Q24H  . feeding supplement (ENSURE ENLIVE)  237 mL Oral BID BM  . heparin  5,000 Units Subcutaneous Q8H  . magnesium oxide  400 mg Oral BID  . midodrine  5 mg Oral TID WC  . 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  1.  Acute renal failure on chronic kidney disease stage III: creatinine fluctuates with fluid status.  Creatinine has recently increased likely secondary to decreased PO intake, states colostomy output has actually decreased. -  Renal function has slightly improved today. Creatinine down to 1.8 with a BUN of 45. Continue IV fluid hydration with 0.9 normal saline at this time.  2.  Increased lower extremity edema.  Albumin was low at admission. Continue albumin 25 g IV twice a day for time-limited trial to increase oncotic pressure.  3. Anemia of chronic kidney disease. Hemoglobin down to 7.7. Continue to monitor.  Avoid Epogen for now. Consider blood transfusion for hemoglobin of 7 or less.          LOS: 1 Daryll Spisak 9/6/201711:49 AM

## 2016-05-09 NOTE — Evaluation (Signed)
Physical Therapy Evaluation Patient Details Name: [name] MRN: [id number] DOB: [birth year] Today's Date: 05/09/2016   History of Present Illness  Pt admitted for anasarca. Pt with several recent admissions secondary to similar symptoms. Pt with history of endometrial carcinoma and is s/p 2 rounds of chemo. Pt also with colostomy on L secondary to diverticulitis.   Clinical Impression  Pt is a pleasant 70 year old female who was admitted for anasarca. Pt performs bed mobility with max assist and heavy cues for participation. Pt very weak and unsafe to perform further mobility at this time. Pt demonstrates deficits with strength/balance/endurance/cognition. Would benefit from skilled PT to address above deficits and promote optimal return to PLOF; recommend transition to STR upon discharge from acute hospitalization.       Follow Up Recommendations SNF    Equipment Recommendations       Recommendations for Other Services       Precautions / Restrictions Precautions Precautions: Fall Precaution Comments: LLQ colostomy Restrictions Weight Bearing Restrictions: No      Mobility  Bed Mobility Overal bed mobility: Needs Assistance Bed Mobility: Supine to Sit     Supine to sit: Max assist     General bed mobility comments: assist for initiating movement and sliding B LE off EOB. Pt then able to initiate moving R hand to railing, however unable to grab railing. Heavy assist for trunk support to achieve seated balance. Once seated, needs min assist to maintain balance. Only able to sit for approx 30 seconds prior to requesting to return back supine.  Transfers                 General transfer comment: unable to attempt at this time secondary to weakness and pt complains of being cold  Ambulation/Gait                Stairs            Wheelchair Mobility    Modified Rankin (Stroke Patients Only)       Balance Overall balance assessment: Needs  assistance Sitting-balance support: Feet unsupported;Bilateral upper extremity supported Sitting balance-Leahy Scale: Fair                                       Pertinent Vitals/Pain Pain Assessment:  (complains of being freezing)    Home Living Family/patient expects to be discharged to:: Private residence Living Arrangements: Spouse/significant other Available Help at Discharge: Family Type of Home: House Home Access: Stairs to enter Entrance Stairs-Rails: Right Entrance Stairs-Number of Steps: 5 Home Layout: One level Home Equipment: Walker - 2 wheels      Prior Function           Comments: Pt confused and poor historian. Unsure of recent PLOF- according to chart review, pt was indep with ADLs and ambulation in home.     Hand Dominance        Extremity/Trunk Assessment   Upper Extremity Assessment: Generalized weakness (B UE grossly 3/5)           Lower Extremity Assessment: Generalized weakness (B LE grossly 2/5)         Communication   Communication: No difficulties  Cognition Arousal/Alertness: Lethargic Behavior During Therapy: WFL for tasks assessed/performed Overall Cognitive Status: Impaired/Different from baseline  General Comments      Exercises Other Exercises Other Exercises: B supine ther-ex performed including ankle pumps, SLRs, and heel slides. All ther-ex performed x 10 reps with max assist and heavy cues for attention to task as she is lethargic.       Assessment/Plan    PT Assessment Patient needs continued PT services  PT Diagnosis Difficulty walking;Generalized weakness   PT Problem List Decreased strength;Decreased activity tolerance;Decreased balance;Decreased mobility  PT Treatment Interventions DME instruction;Gait training;Stair training;Functional mobility training;Therapeutic activities;Therapeutic exercise;Balance training;Neuromuscular re-education;Patient/family education    PT Goals (Current goals can be found in the Care Plan section) Acute Rehab PT Goals Patient Stated Goal: Pt unable to state secondary to confusion PT Goal Formulation: Patient unable to participate in goal setting Time For Goal Achievement: 05/23/16 Potential to Achieve Goals: Fair    Frequency Min 2X/week   Barriers to discharge Decreased caregiver support;Inaccessible home environment      Co-evaluation               End of Session   Activity Tolerance: Patient limited by fatigue Patient left: in bed;with bed alarm set Nurse Communication: Mobility status         Time: 0949-1000 PT Time Calculation (min) (ACUTE ONLY): 11 min   Charges:   PT Evaluation $PT Eval Moderate Complexity: 1 Procedure PT Treatments $Therapeutic Exercise: 8-22 mins   PT G Codes:        Zahki Hoogendoorn 05-16-2016, 10:40 AM  Greggory Stallion, PT, DPT [phone]

## 2016-05-09 NOTE — Care Management (Signed)
Admitted to [facility] with the diagnosis of anasarca. Discharged from [facility] 05/01/16. Lives with husband, [name], [phone]). States he hasn't seen Dr. Ronnald Ramp or [facility] since being discharged from the hospital. No falls. States she came back to the hospital because "Didn't feel good."  Rolling walker in the home. Gets prescriptions filled at [facility] in [place].  Hgb 7.7.  Oncology consult. Gettiing Albumen IV Physical therapy evaluation pending. [name] RN MSN CCM Care Management [phone]

## 2016-05-09 NOTE — Plan of Care (Signed)
Problem: Activity: Goal: Risk for activity intolerance will decrease Outcome: Not Progressing Pt very weak this shift, complained of nausea and had small amount of vomiting, zofran IV ordered per MD with improvement.

## 2016-05-09 NOTE — Progress Notes (Signed)
[facility] Hematology/Oncology Progress Note  Date of admission: 05/08/2016  Hospital day:  05/08/2016  Chief Complaint: [name] is a 70 y.o. female with recurrent stage III endometrial cancer who was admitted with failure to thrive, chronic renal insufficiency, lower extremity edema secondary to poor nutrition (low albumen), and leukocytosis.  History of Present Illness:  The patient was admitted to [facility] from 04/25/2016 - 05/01/2016.  Fluid was mobilized with IV albumen and Lasix.  Diarrhea was managed with SQ octreotide, Rifaxin, and low dose codeine.  She underwent colonoscopy by Dr. Allen Norris on 04/27/2016.  There was an 8 mm polyp in the transverse colon (tubular adenoma).  Random colon bopsies revealed focal mild active colitis suggestive of mild resolving infectious colitis.   The patient was seen by me in the medical oncology clinic on 05/03/2016.  At that time, she was seen for reassessment after interval hospitalization.  Symptomatically, she felt better with mobilization of her fluids (anasarca).  She was eating and drinking at home.  Blood pressure was low.  Xifaxan was cost-prohibitive.  She received IVF in clinic and was set up with fluids (500 cc NS) daily with home health.  BUN was 26 with a creatinine of 2.5.  She continued her oral potassium and magnesium supplementation.  She was to start oral budesonide for colitis.   She started budesonide on 05/04/2016.  She received daily 500 cc NS IV via home health (05/04/2016 - 05/07/2016).  Her bowel movements improved.  She has no bowel movements on 09/01 and 05/05/2016.  She has had 1 pasty bowel movement a day on 09/03 and 05/07/2016.  She stopped octreotide on 05/05/2016.    She notes that her appetite has been poor.  She has had recurrence of fluid in her right leg and alos left hand.  She states that she can't catch her breath.  She is spending the majority of the day in bed.  She states that she has  "nothing left" as she is so exhausted.  She denies any fevers.  Subjective:  Feels exhausted.  Patient unable to participate in physical therapy at home.  Her husband having difficulty caring for her needs.  Social History: The patient is accompanied by her husband.  Allergies: No Known Allergies  Scheduled Medications: . acetaminophen-codeine  1 tablet Oral TID  . acidophilus  1 capsule Oral BID  . albumin human  25 g Intravenous BID  . budesonide  9 mg Oral BH-q7a  . calcium carbonate  400 mg of elemental calcium Oral TID WC  . ENSURE  1 Can Oral TID BM  . heparin  5,000 Units Subcutaneous Q8H  . magnesium oxide  400 mg Oral BID  . pantoprazole  40 mg Oral Daily    Review of Systems: GENERAL:  Exhausted.  No fevers or sweats.  Weight stable. PERFORMANCE STATUS (ECOG):  3 HEENT:  No visual changes, runny nose, sore throat, mouth sores or tenderness. Lungs:  "Can't catch my breath".  Nocough.  No hemoptysis. Cardiac:  No chest pain, palpitations, orthopnea, or PND. GI:  Ostomy output dramatically improved (see HPI).  Poor oral intake.  No nausea, vomiting, constipation, melena or hematochezia. GU:  No urgency, frequency, dysuria, or hematuria. Musculoskeletal:  No back pain.  No muscle tenderness. Extremities:  Right lower extremity edema and left hand edema. Skin:  No rashes, skin changes or ulcers. Neuro:  General weakness.  No headache, numbness or weakness, balance or coordination issues. Endocrine:  No diabetes,  thyroid issues, hot flashes or night sweats. Psych:  Emotionally exhausted. Pain:  No pain. Review of systems:  All other systems reviewed and found to be negative.  Physical Exam: Blood pressure 107/60, pulse 82, temperature 97.5 F (36.4 C), temperature source Oral, resp. rate 17, height _0  (1.575 m), weight 159 lb (72.1 kg), SpO2 97 %.  GENERAL:  Chronically ill appearing somewhat disheveled woman sitting comfortably in a wheelchair in no acute  distress. MENTAL STATUS:  Alert and oriented to person, place and time. HEAD:  Short gray hair.  Normocephalic, atraumatic, face symmetric, no Cushingoid features. EYES:  Blue eyes.  Pupils equal round and reactive to light and accomodation.  No conjunctivitis or scleral icterus. ENT:  Oropharynx clear without lesion.  Tongue normal. Mucous membranes moist.  RESPIRATORY:  Poor respiratory excursion.  Decreased breath sounds at the bases.  Clear to auscultation without rales, wheezes or rhonchi. CARDIOVASCULAR:  Regular rate and rhythm without murmur, rub or gallop. ABDOMEN:  Colostomy.  Soft, non-tender, with active bowel sounds, and no hepatosplenomegaly.  No masses. SKIN:  No erythema. EXTREMITIES:  Right 3+ lower extremity edema.  1+ left lower extremity edema.  Left hand edema.  No palpable cords. LYMPH NODES: No palpable cervical, supraclavicular, axillary or inguinal adenopathy  NEUROLOGICAL: Unremarkable. PSYCH:  Defeated.   Results for orders placed or performed during the hospital encounter of 05/08/16 (from the past 48 hour(s))  Hepatic function panel     Status: Abnormal   Collection Time: 05/08/16 11:07 AM  Result Value Ref Range   Total Protein 4.7 (L) 6.5 - 8.1 g/dL   Albumin 2.1 (L) 3.5 - 5.0 g/dL   AST 147 (H) 15 - 41 U/L   ALT 82 (H) 14 - 54 U/L   Alkaline Phosphatase 576 (H) 38 - 126 U/L   Total Bilirubin 1.0 0.3 - 1.2 mg/dL   Bilirubin, Direct 0.4 0.1 - 0.5 mg/dL   Indirect Bilirubin 0.6 0.3 - 0.9 mg/dL  Urinalysis complete, with microscopic ([facility] only)     Status: Abnormal   Collection Time: 05/08/16  6:59 PM  Result Value Ref Range   Color, Urine YELLOW (A) YELLOW   APPearance CLEAR (A) CLEAR   Glucose, UA NEGATIVE NEGATIVE mg/dL   Bilirubin Urine NEGATIVE NEGATIVE   Ketones, ur NEGATIVE NEGATIVE mg/dL   Specific Gravity, Urine 1.018 1.005 - 1.030   Hgb urine dipstick 3+ (A) NEGATIVE   pH 5.0 5.0 - 8.0   Protein, ur 30 (A) NEGATIVE mg/dL   Nitrite NEGATIVE  NEGATIVE   Leukocytes, UA 2+ (A) NEGATIVE   RBC / HPF 0-5 0 - 5 RBC/hpf   WBC, UA TOO NUMEROUS TO COUNT 0 - 5 WBC/hpf   Bacteria, UA RARE (A) NONE SEEN   Squamous Epithelial / LPF 0-5 (A) NONE SEEN   Mucous PRESENT   Basic metabolic panel     Status: Abnormal   Collection Time: 05/09/16  4:36 AM  Result Value Ref Range   Sodium 140 135 - 145 mmol/L   Potassium 4.6 3.5 - 5.1 mmol/L   Chloride 111 101 - 111 mmol/L   CO2 22 22 - 32 mmol/L   Glucose, Bld 87 65 - 99 mg/dL   BUN 45 (H) 6 - 20 mg/dL   Creatinine, Ser 1.82 (H) 0.44 - 1.00 mg/dL   Calcium 7.0 (L) 8.9 - 10.3 mg/dL   GFR calc non Af Amer 27 (L) >60 mL/min   GFR calc Af Amer 32 (L) >  60 mL/min    Comment: (NOTE) The eGFR has been calculated using the CKD EPI equation. This calculation has not been validated in all clinical situations. eGFR's persistently <60 mL/min signify possible Chronic Kidney Disease.    Anion gap 7 5 - 15  CBC     Status: Abnormal   Collection Time: 05/09/16  4:36 AM  Result Value Ref Range   WBC 11.2 (H) 3.6 - 11.0 K/uL   RBC 2.41 (L) 3.80 - 5.20 MIL/uL   Hemoglobin 7.7 (L) 12.0 - 16.0 g/dL   HCT 22.9 (L) 35.0 - 47.0 %   MCV 95.2 80.0 - 100.0 fL   MCH 32.0 26.0 - 34.0 pg   MCHC 33.6 32.0 - 36.0 g/dL   RDW 17.2 (H) 11.5 - 14.5 %   Platelets 189 150 - 440 K/uL   Dg Chest 2 View  Result Date: 05/08/2016 CLINICAL DATA:  Lower extremity swelling and elevated white blood cell count. 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 EXAM: CHEST  2 VIEW COMPARISON:  Chest x-ray of March 26, 2016 FINDINGS: The lungs are adequately inflated. There is no focal infiltrate. No pulmonary parenchymal nodules or masses are observed. There is no pleural effusion. The heart and pulmonary vascularity are normal. There is calcification in the wall of the aortic arch. The Port-A-Cath appliance catheter tip projects over the midportion of the SVC.  IMPRESSION: There is no active cardiopulmonary disease. Aortic atherosclerosis. Electronically Signed   By: David  Martinique M.D.   On: 05/08/2016 17:11   US Venous Img Lower Bilateral  Result Date: 05/08/2016 CLINICAL DATA:  70 year old female with lower extremity swelling for the past 2 months EXAM: BILATERAL LOWER EXTREMITY VENOUS DOPPLER ULTRASOUND TECHNIQUE: Gray-scale sonography with graded compression, as well as color Doppler and duplex ultrasound were performed to evaluate the lower extremity deep venous systems from the level of the common femoral vein and including the common femoral, femoral, profunda femoral, popliteal and calf veins including the posterior tibial, peroneal and gastrocnemius veins when visible. The superficial great saphenous vein was also interrogated. Spectral Doppler was utilized to evaluate flow at rest and with distal augmentation maneuvers in the common femoral, femoral and popliteal veins. COMPARISON:  Recent prior lower extremity ultrasound 04/26/2016 FINDINGS: RIGHT LOWER EXTREMITY Common Femoral Vein: No evidence of thrombus. Normal compressibility, respiratory phasicity and response to augmentation. Saphenofemoral Junction: No evidence of thrombus. Normal compressibility and flow on color Doppler imaging. Profunda Femoral Vein: No evidence of thrombus. Normal compressibility and flow on color Doppler imaging. Femoral Vein: No evidence of thrombus. Normal compressibility, respiratory phasicity and response to augmentation. Popliteal Vein: No evidence of thrombus. Normal compressibility, respiratory phasicity and response to augmentation. Calf Veins: No evidence of thrombus. Normal compressibility and flow on color Doppler imaging. Superficial Great Saphenous Vein: No evidence of thrombus. Normal compressibility and flow on color Doppler imaging. Venous Reflux:  None. Other Findings:  None. LEFT LOWER EXTREMITY Common Femoral Vein: No evidence of thrombus. Normal  compressibility, respiratory phasicity and response to augmentation. Saphenofemoral Junction: No evidence of thrombus. Normal compressibility and flow on color Doppler imaging. Profunda Femoral Vein: No evidence of thrombus. Normal compressibility and flow on color Doppler imaging. Femoral Vein: No evidence of thrombus. Normal compressibility, respiratory phasicity and response to augmentation. Popliteal Vein: No evidence of thrombus. Normal compressibility, respiratory phasicity and response to augmentation. Calf Veins: No evidence of thrombus. Normal compressibility and flow on color Doppler imaging. Superficial  Great Saphenous Vein: No evidence of thrombus. Normal compressibility and flow on color Doppler imaging. Venous Reflux:  None. Other Findings:  None. IMPRESSION: No evidence of deep venous thrombosis. Electronically Signed   By: Jacqulynn Cadet M.D.   On: 05/08/2016 17:21    Assessment:  [name] is a 70 y.o. female with recurrent stage III endometrial cancer s/p 2 cycles of carboplatin and Taxol (last 04/05/2016).   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    She was admitted to [facility] from 04/25/2016 - 05/01/2016.  Fluid was mobilized with IV albumen and Lasix.  Diarrhea was managed with SQ octreotide, Rifaxin, and low dose codeine.  She underwent colonoscopy by Dr. Allen Norris on 04/27/2016.  Random colon bopsies revealed focal mild active colitis suggestive of mild resolving infectious colitis.  She was started on budesonide on 05/04/2016.  She is severely debilitated.  She has recurrent lower extremity edema after 5 days of IV fluids (500 cc/day) secondary to poor nutrition.  Her blood pressure is marginal.  She has leukocytosis (? secondary to infection).  Plan: 1.  Oncology:  Recurrent stage III endometrial cancer.  No current plan for reinstitution of chemotherapy until performance status  improves.  Anemia of chronic disease and chronic renal insufficiency.  Lower extremity duplex reveals no evidence of DVT.  Lovenox prophylaxis.  2.  Gastroenterology:  Dramatic improvement in diarrhea with institution of budesonide.  She is off octreotide.  Hopefully her nutritional status will improve.  3.  Renal: Acute on chronic renal failure.  She was able to mobilize fluids with Lasix and albumen last admission.  Secondary to marginal BP last week in outpatient department, she was started on IVF.  Despite ongoing fluids, BP has remained marginal and 3rd place fluid has returned due to low albumen (hopefully will improve with better nutrition as diarrhea has resolved).  Supplement electrolytes as needed.  4.  Nutrition:  Poor nutrition previously aggravated by ongoing diarrhea.  Diarrhea has now resolved.  Consult nutritional services to assist in maximizing recovery.  5.  Infectious disease:  Leukocytosis of unclear etiology.  CXR without evidence of infection.  Urinalysis reveals numerous WBCs.  Await culture.  Ensure no evidence of skin breakdown as patient has been bed ridden for weeks.  6.  Disposition:  Patient will need skilled nursing after discharge.  She declined skilled nursing with her 4 recent admissions.   Lequita Asal, MD   05/08/2016

## 2016-05-09 NOTE — Progress Notes (Signed)
PT Cancellation Note  Patient Details Name: [name] MRN: [id number] DOB: Sep 17, 1945   Cancelled Treatment:    Reason Eval/Treat Not Completed: Other (comment). Consult received and chart reviewed. Upon arrival, RN reports colostomy bag ruptured and she is replacing it. Asked to hold PT at this time until after clean up. Will re-attempt, time permitting.   Generoso Cropper 05/09/2016, 9:27 AM  Greggory Stallion, PT, DPT [phone]

## 2016-05-10 ENCOUNTER — Inpatient Hospital Stay: Payer: Medicare Other

## 2016-05-10 DIAGNOSIS — C541 Malignant neoplasm of endometrium: Secondary | ICD-10-CM

## 2016-05-10 DIAGNOSIS — Z7189 Other specified counseling: Secondary | ICD-10-CM

## 2016-05-10 DIAGNOSIS — Z789 Other specified health status: Secondary | ICD-10-CM

## 2016-05-10 DIAGNOSIS — Z515 Encounter for palliative care: Secondary | ICD-10-CM

## 2016-05-10 DIAGNOSIS — R531 Weakness: Secondary | ICD-10-CM

## 2016-05-10 DIAGNOSIS — R0600 Dyspnea, unspecified: Secondary | ICD-10-CM

## 2016-05-10 LAB — BASIC METABOLIC PANEL
Anion gap: 8 (ref 5–15)
BUN: 44 mg/dL — AB (ref 6–20)
CALCIUM: 7.7 mg/dL — AB (ref 8.9–10.3)
CO2: 21 mmol/L — ABNORMAL LOW (ref 22–32)
CREATININE: 1.85 mg/dL — AB (ref 0.44–1.00)
Chloride: 114 mmol/L — ABNORMAL HIGH (ref 101–111)
GFR calc Af Amer: 31 mL/min — ABNORMAL LOW (ref >60)
GFR calc non Af Amer: 27 mL/min — ABNORMAL LOW (ref >60)
Glucose, Bld: 88 mg/dL (ref 65–99)
Potassium: 4.5 mmol/L (ref 3.5–5.1)
SODIUM: 143 mmol/L (ref 135–145)

## 2016-05-10 LAB — CBC
HCT: 22.4 % — ABNORMAL LOW (ref 35.0–47.0)
Hemoglobin: 7.5 g/dL — ABNORMAL LOW (ref 12.0–16.0)
MCH: 31.5 pg (ref 26.0–34.0)
MCHC: 33.6 g/dL (ref 32.0–36.0)
MCV: 94 fL (ref 80.0–100.0)
PLATELETS: 169 10*3/uL (ref 150–440)
RBC: 2.38 MIL/uL — ABNORMAL LOW (ref 3.80–5.20)
RDW: 17.5 % — AB (ref 11.5–14.5)
WBC: 12.3 10*3/uL — ABNORMAL HIGH (ref 3.6–11.0)

## 2016-05-10 LAB — URINE CULTURE

## 2016-05-10 LAB — OCCULT BLOOD X 1 CARD TO LAB, STOOL: FECAL OCCULT BLD: NEGATIVE

## 2016-05-10 LAB — BILIRUBIN, DIRECT: BILIRUBIN DIRECT: 1.2 mg/dL — AB (ref 0.1–0.5)

## 2016-05-10 MED ORDER — ACETAMINOPHEN-CODEINE #3 300-30 MG PO TABS
1.0000 | ORAL_TABLET | Freq: Three times a day (TID) | ORAL | Status: DC | PRN
  Filled 2016-05-10: qty 1

## 2016-05-10 MED ORDER — DRONABINOL 2.5 MG PO CAPS
2.5000 mg | ORAL_CAPSULE | Freq: Two times a day (BID) | ORAL | Status: DC
  Administered 2016-05-10 – 2016-05-11 (×4): 2.5 mg via ORAL
  Filled 2016-05-10 (×4): qty 1

## 2016-05-10 MED ORDER — MIDODRINE HCL 5 MG PO TABS
10.0000 mg | ORAL_TABLET | Freq: Three times a day (TID) | ORAL | Status: DC
  Administered 2016-05-10 – 2016-05-11 (×4): 10 mg via ORAL
  Filled 2016-05-10 (×5): qty 2

## 2016-05-10 NOTE — Progress Notes (Signed)
Subjective:  Awaiting renal function testing today. Patient states that she feels slightly better today. A bit more awake and alert.   Objective:  Vital signs in last 24 hours:  Temp:  [97.6 F (36.4 C)-98.4 F (36.9 C)] 98.4 F (36.9 C) (09/07 0546) Pulse Rate:  [77-81] 79 (09/07 0546) Resp:  [20] 20 (09/06 2022) BP: (88-103)/(49-55) 88/49 (09/07 0546) SpO2:  [94 %-100 %] 94 % (09/07 0428)  Weight change:  Filed Weights   05/08/16 2030  Weight: 72.1 kg (159 lb)    Intake/Output:    Intake/Output Summary (Last 24 hours) at 05/10/16 1202 Last data filed at 05/10/16 1025  Gross per 24 hour  Intake              550 ml  Output              250 ml  Net              300 ml     Physical Exam: General: No acute distress, laying in the bed   HEENT Moist oral mucous membranes   Neck Supple   Pulm/lungs Clear to auscultation bilateral, normal effort  CVS/Heart S1S2 no rubs  Abdomen:  Soft, nontender, nondistended, colostomy in place   Extremities: Trace b/l LE edema  Neurologic: Alert, oriented x 3, following commands  Skin: Dry skin on b/l LE's          Basic Metabolic Panel:   Recent Labs Lab 05/08/16 1107 05/09/16 0436 05/09/16 1423  NA 135 140 142  K 4.9 4.6 4.6  CL 109 111 114*  CO2 19* 22 21*  GLUCOSE 128* 87 105*  BUN 48* 45* 46*  CREATININE 1.99* 1.82* 1.84*  CALCIUM 7.1* 7.0* 7.2*  MG 1.9  --   --   PHOS  --   --  3.1     CBC:  Recent Labs Lab 05/08/16 1107 05/09/16 0436 05/10/16 0533  WBC 19.6* 11.2* 12.3*  NEUTROABS 16.9*  --   --   HGB 8.6* 7.7* 7.5*  HCT 26.3* 22.9* 22.4*  MCV 95.8 95.2 94.0  PLT 241 189 169      Microbiology:  Recent Results (from the past 720 hour(s))  Urine culture     Status: Abnormal   Collection Time: 05/08/16  6:59 PM  Result Value Ref Range Status   Specimen Description URINE, CATHETERIZED  Final   Special Requests NONE  Final   Culture MULTIPLE SPECIES PRESENT, SUGGEST RECOLLECTION (A)  Final    Report Status 05/10/2016 FINAL  Final    Coagulation Studies: No results for input(s): LABPROT, INR in the last 72 hours.  Urinalysis:  Recent Labs  05/08/16 1859  COLORURINE YELLOW*  LABSPEC 1.018  PHURINE 5.0  GLUCOSEU NEGATIVE  HGBUR 3+*  BILIRUBINUR NEGATIVE  KETONESUR NEGATIVE  PROTEINUR 30*  NITRITE NEGATIVE  LEUKOCYTESUR 2+*      Imaging: Ct Abdomen Pelvis Wo Contrast  Result Date: 05/09/2016 CLINICAL DATA:  Anorexia, fatigue and weight loss.  Diarrhea EXAM: CT ABDOMEN AND PELVIS WITHOUT CONTRAST TECHNIQUE: Multidetector CT imaging of the abdomen and pelvis was performed following the standard protocol without IV contrast. COMPARISON:  02/06/2016 FINDINGS: Lower chest: Pleuro parenchymal scarring and thickening identified within the right base. Hepatobiliary: Marked hepatic steatosis is identified. No focal liver abnormality. Pancreas: The pancreas appears normal. Spleen: The spleen appears normal. Adrenals/Urinary Tract: Normal adrenal glands. Numerous bilateral renal cysts are again identified. These are of varying size, attenuation and complexity. These  are all incompletely characterized without IV contrast. Nonobstructing calculi are again noted within the inferior pole the right kidney. These measure up to 3 mm, image 38 of series 2. No hydronephrosis. The urinary bladder appears normal for degree of distention. Stomach/Bowel: Normal appearance of the stomach. Increase caliber of the small bowel loops are identified which measure up to 4.9 cm. When compared with the examination dated 02/06/2016 the degree of small-bowel distention is similar. Similar transition point within the left lower quadrant of the abdomen, image 57 of series 2. No pathologic dilatation of the colon. There is a left lower quadrant colostomy. Hartman's pouch is identified within the pelvis and is unremarkable. Vascular/Lymphatic: Calcified atherosclerotic disease involves the abdominal aorta. No  aneurysm. Periaortic adenopathy is again noted. Index node measures 1.1 cm, image 29 of series 2. This is decreased from 1.9 cm previously. The lymph node mass within the right pelvic sidewall has a short axis of 1.9 cm, image 62 of series 2. Previously 2.9 cm. Reproductive: Previous hysterectomy. Other: Trace ascites identified within the upper abdomen. Musculoskeletal: Asymmetric sclerosis involving the right iliac bone is identified and is suspicious for metastatic disease. This is unchanged from previous exam. IMPRESSION: 1. Examination is positive for recurrent small bowel dilatation compatible with partial small bowel obstruction. 2. Since the previous exam there has been decrease in size of periaortic adenopathy and right pelvic sidewall adenopathy. Findings are compatible with partial response to therapy. 3. 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 4. Hepatic steatosis 5. Aortic atherosclerosis. Electronically Signed   By: Kerby Moors M.D.   On: 05/09/2016 13:11   Dg Chest 2 View  Result Date: 05/08/2016 CLINICAL DATA:  Lower extremity swelling and elevated white blood cell count. 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 EXAM: CHEST  2 VIEW COMPARISON:  Chest x-ray of March 26, 2016 FINDINGS: The lungs are adequately inflated. There is no focal infiltrate. No pulmonary parenchymal nodules or masses are observed. There is no pleural effusion. The heart and pulmonary vascularity are normal. There is calcification in the wall of the aortic arch. The Port-A-Cath appliance catheter tip projects over the midportion of the SVC. IMPRESSION: There is no active cardiopulmonary disease. Aortic atherosclerosis. Electronically Signed   By: David  Martinique M.D.   On: 05/08/2016 17:11   US Venous Img Lower Bilateral  Result Date: 05/08/2016 CLINICAL DATA:  70 year old female with lower extremity swelling for the past 2  months EXAM: BILATERAL LOWER EXTREMITY VENOUS DOPPLER ULTRASOUND TECHNIQUE: Gray-scale sonography with graded compression, as well as color Doppler and duplex ultrasound were performed to evaluate the lower extremity deep venous systems from the level of the common femoral vein and including the common femoral, femoral, profunda femoral, popliteal and calf veins including the posterior tibial, peroneal and gastrocnemius veins when visible. The superficial great saphenous vein was also interrogated. Spectral Doppler was utilized to evaluate flow at rest and with distal augmentation maneuvers in the common femoral, femoral and popliteal veins. COMPARISON:  Recent prior lower extremity ultrasound 04/26/2016 FINDINGS: RIGHT LOWER EXTREMITY Common Femoral Vein: No evidence of thrombus. Normal compressibility, respiratory phasicity and response to augmentation. Saphenofemoral Junction: No evidence of thrombus. Normal compressibility and flow on color Doppler imaging. Profunda Femoral Vein: No evidence of thrombus. Normal compressibility and flow on color Doppler imaging. Femoral Vein: No evidence of thrombus. Normal compressibility, respiratory phasicity and response to augmentation. Popliteal Vein: No evidence of thrombus. Normal compressibility, respiratory phasicity  and response to augmentation. Calf Veins: No evidence of thrombus. Normal compressibility and flow on color Doppler imaging. Superficial Great Saphenous Vein: No evidence of thrombus. Normal compressibility and flow on color Doppler imaging. Venous Reflux:  None. Other Findings:  None. LEFT LOWER EXTREMITY Common Femoral Vein: No evidence of thrombus. Normal compressibility, respiratory phasicity and response to augmentation. Saphenofemoral Junction: No evidence of thrombus. Normal compressibility and flow on color Doppler imaging. Profunda Femoral Vein: No evidence of thrombus. Normal compressibility and flow on color Doppler imaging. Femoral Vein: No  evidence of thrombus. Normal compressibility, respiratory phasicity and response to augmentation. Popliteal Vein: No evidence of thrombus. Normal compressibility, respiratory phasicity and response to augmentation. Calf Veins: No evidence of thrombus. Normal compressibility and flow on color Doppler imaging. Superficial Great Saphenous Vein: No evidence of thrombus. Normal compressibility and flow on color Doppler imaging. Venous Reflux:  None. Other Findings:  None. IMPRESSION: No evidence of deep venous thrombosis. Electronically Signed   By: Jacqulynn Cadet M.D.   On: 05/08/2016 17:21   US Abdomen Limited Ruq  Result Date: 05/10/2016 CLINICAL DATA:  Elevated transaminase EXAM: US ABDOMEN LIMITED - RIGHT UPPER QUADRANT COMPARISON:  05/09/2016 FINDINGS: Gallbladder: Previous cholecystectomy. Common bile duct: Diameter: 4.1 mm. Liver: Diffusely increased liver echogenicity identified. No focal liver abnormality identified. Trace perihepatic ascites identified. IMPRESSION: 1. Hepatic steatosis 2. Perihepatic ascites. Electronically Signed   By: Kerby Moors M.D.   On: 05/10/2016 10:06     Medications:     . acetaminophen-codeine  1 tablet Oral TID  . acidophilus  1 capsule Oral BID  . albumin human  25 g Intravenous BID  . budesonide  9 mg Oral BH-q7a  . calcium carbonate  400 mg of elemental calcium Oral TID WC  . cefTRIAXone (ROCEPHIN)  IV  1 g Intravenous Q24H  . dronabinol  2.5 mg Oral BID AC  . feeding supplement (ENSURE ENLIVE)  237 mL Oral BID BM  . heparin  5,000 Units Subcutaneous Q8H  . magnesium oxide  400 mg Oral BID  . midodrine  5 mg Oral TID WC  . 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  1.  Acute  renal failure on chronic kidney disease stage III: creatinine fluctuates with fluid status.  Creatinine has recently increased likely secondary to decreased PO intake, states colostomy output has actually decreased. - We will reorder renal function testing today. Yesterday creatinine was 1.8. I encouraged the patient increase by mouth fluid intake as she is off of IV fluids now.  2.  Increased lower extremity edema.  This has improved. Therefore we will discontinue albumin.  3. Anemia of chronic kidney disease. Hemoglobin slowly drifting down. Currently hemoglobin is 7.5. Consider transfusion for hemoglobin of 7.5 or less. Avoid Epogen for now.       LOS: 2 Shulem Mader 9/7/201712:02 PM

## 2016-05-10 NOTE — Progress Notes (Signed)
CSW discussed patient with RNCM. Patient and family are declining SNF at this time. CSW is signing off but is available if a need were to arise.  Ernest Pine, MSW, LCSW, [place] Clinical Social Worker [phone]

## 2016-05-10 NOTE — Progress Notes (Signed)
[facility] NAME: [name]    MR#:  [id number]  DATE OF BIRTH:  1946-02-11  SUBJECTIVE:  CHIEF COMPLAINT:  No chief complaint on file.  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 This chronic diarrhea, CK D, who presents to the hospital with complaints of elevated white blood cell count and worsening lower extremity swelling, generalized weakness, she was again noted to be hypotensive, however, denied nausea, vomiting, admitted of poor appetite, admitted of orthopnea and shortness of breath. On arrival to the hospital, the patient's blood pressure was noted to be low, labs revealed renal failure with creatinine of 1.99, elevated transaminase level, white the cell count. Patient is admitted to the hospital for further evaluation and treatment, she was initiated on IV fluids, and her hemoglobin level dropped down to 7.5. The patient feels poorly today, needs to go to the bathroom, not able to review systems due to significant agitation and restlessness at the moment.   Review of Systems  Constitutional: Positive for malaise/fatigue and weight loss. Negative for chills and fever.  HENT: Negative for congestion.   Eyes: Negative for blurred vision and double vision.  Respiratory: Negative for cough, sputum production, shortness of breath and wheezing.   Cardiovascular: Positive for leg swelling. Negative for chest pain, palpitations, orthopnea and PND.  Gastrointestinal: Negative for abdominal pain, blood in stool, constipation, diarrhea, nausea and vomiting.  Genitourinary: Negative for dysuria, frequency, hematuria and urgency.  Musculoskeletal: Negative for falls.  Neurological: Negative for dizziness, tremors, focal weakness and headaches.  Endo/Heme/Allergies: Does not bruise/bleed easily.   Psychiatric/Behavioral: Negative for depression. The patient does not have insomnia.     VITAL SIGNS: Blood pressure (!) 88/49, pulse 79, temperature 98.4 F (36.9 C), temperature source Oral, resp. rate 20, height 5\' 2"  (1.575 m), weight 72.1 kg (159 lb), SpO2 94 %.  PHYSICAL EXAMINATION:   GENERAL:  70 y.o.-year-old patient lying in the bed in mild to moderate distress, somewhat restless, uncomfortable due to needed to urinate, although denies abdominal pain.  EYES: Pupils equal, round, reactive to light and accommodation. No scleral icterus. Extraocular muscles intact.  HEENT: Head atraumatic, normocephalic. Oropharynx and nasopharynx clear.  NECK:  Supple, no jugular venous distention. No thyroid enlargement, no tenderness.  LUNGS: Diminished breath sounds bilaterally at bases, no wheezing, rales,rhonchi or crepitation. No use of accessory muscles of respiration.  CARDIOVASCULAR: S1, S2 normal. No murmurs, rubs, or gallops.  ABDOMEN: Soft, nontender, nondistended. Bowel sounds present. No organomegaly or mass.  EXTREMITIES: 1-2+ lower extremity and pedal edema, no cyanosis, or clubbing.  NEUROLOGIC: Cranial nerves II through XII are intact. Muscle strength 5/5 in all extremities. Sensation intact. Gait not checked.  PSYCHIATRIC: The patient is alert and oriented x 3.  SKIN: No obvious rash, lesion, or ulcer.   ORDERS/RESULTS REVIEWED:   CBC  Recent Labs Lab 05/08/16 1107 05/09/16 0436 05/10/16 0533  WBC 19.6* 11.2* 12.3*  HGB 8.6* 7.7* 7.5*  HCT 26.3* 22.9* 22.4*  PLT 241 189 169  MCV 95.8 95.2 94.0  MCH 31.4 32.0 31.5  MCHC 32.7 33.6 33.6  RDW 17.6* 17.2* 17.5*  LYMPHSABS 1.4  --   --   MONOABS 1.1*  --   --   EOSABS 0.0  --   --   BASOSABS 0.1  --   --    ------------------------------------------------------------------------------------------------------------------  Chemistries   Recent Labs Lab 05/08/16 1107 05/09/16 0436 05/09/16 1423  NA 135 140 142   K 4.9 4.6 4.6  CL 109 111 114*  CO2 19* 22 21*  GLUCOSE 128* 87 105*  BUN 48* 45* 46*  CREATININE 1.99* 1.82* 1.84*  CALCIUM 7.1* 7.0* 7.2*  MG 1.9  --   --   AST 147*  --  133*  ALT 82*  --  80*  ALKPHOS 576*  --  545*  BILITOT 1.0  --  1.8*   ------------------------------------------------------------------------------------------------------------------ estimated creatinine clearance is 26.8 mL/min (by C-G formula based on SCr of 1.84 mg/dL). ------------------------------------------------------------------------------------------------------------------ No results for input(s): TSH, T4TOTAL, T3FREE, THYROIDAB in the last 72 hours.  Invalid input(s): FREET3  Cardiac Enzymes No results for input(s): CKMB, TROPONINI, MYOGLOBIN in the last 168 hours.  Invalid input(s): CK ------------------------------------------------------------------------------------------------------------------ Invalid input(s): POCBNP ---------------------------------------------------------------------------------------------------------------  RADIOLOGY: Ct Abdomen Pelvis Wo Contrast  Result Date: 05/09/2016 CLINICAL DATA:  Anorexia, fatigue and weight loss.  Diarrhea EXAM: CT ABDOMEN AND PELVIS WITHOUT CONTRAST TECHNIQUE: Multidetector CT imaging of the abdomen and pelvis was performed following the standard protocol without IV contrast. COMPARISON:  02/06/2016 FINDINGS: Lower chest: Pleuro parenchymal scarring and thickening identified within the right base. Hepatobiliary: Marked hepatic steatosis is identified. No focal liver abnormality. Pancreas: The pancreas appears normal. Spleen: The spleen appears normal. Adrenals/Urinary Tract: Normal adrenal glands. Numerous bilateral renal cysts are again identified. These are of varying size, attenuation and complexity. These are all incompletely characterized without IV contrast. Nonobstructing calculi are again noted within the inferior pole the right kidney.  These measure up to 3 mm, image 38 of series 2. No hydronephrosis. The urinary bladder appears normal for degree of distention. Stomach/Bowel: Normal appearance of the stomach. Increase caliber of the small bowel loops are identified which measure up to 4.9 cm. When compared with the examination dated 02/06/2016 the degree of small-bowel distention is similar. Similar transition point within the left lower quadrant of the abdomen, image 57 of series 2. No pathologic dilatation of the colon. There is a left lower quadrant colostomy. Hartman's pouch is identified within the pelvis and is unremarkable. Vascular/Lymphatic: Calcified atherosclerotic disease involves the abdominal aorta. No aneurysm. Periaortic adenopathy is again noted. Index node measures 1.1 cm, image 29 of series 2. This is decreased from 1.9 cm previously. The lymph node mass within the right pelvic sidewall has a short axis of 1.9 cm, image 62 of series 2. Previously 2.9 cm. Reproductive: Previous hysterectomy. Other: Trace ascites identified within the upper abdomen. Musculoskeletal: Asymmetric sclerosis involving the right iliac bone is identified and is suspicious for metastatic disease. This is unchanged from previous exam. IMPRESSION: 1. Examination is positive for recurrent small bowel dilatation compatible with partial small bowel obstruction. 2. Since the previous exam there has been decrease in size of periaortic adenopathy and right pelvic sidewall adenopathy. Findings are compatible with partial response to therapy. 3. 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 4. Hepatic steatosis 5. Aortic atherosclerosis. Electronically Signed   By: Kerby Moors M.D.   On: 05/09/2016 13:11   Dg Chest 2 View  Result Date: 05/08/2016 CLINICAL DATA:  Lower extremity swelling and elevated white blood cell count. 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 EXAM:  CHEST  2 VIEW COMPARISON:  Chest x-ray of March 26, 2016 FINDINGS: The lungs are adequately inflated. There is no focal infiltrate. No pulmonary parenchymal nodules or masses are observed.  There is no pleural effusion. The heart and pulmonary vascularity are normal. There is calcification in the wall of the aortic arch. The Port-A-Cath appliance catheter tip projects over the midportion of the SVC. IMPRESSION: There is no active cardiopulmonary disease. Aortic atherosclerosis. Electronically Signed   By: David  Martinique M.D.   On: 05/08/2016 17:11   US Venous Img Lower Bilateral  Result Date: 05/08/2016 CLINICAL DATA:  70 year old female with lower extremity swelling for the past 2 months EXAM: BILATERAL LOWER EXTREMITY VENOUS DOPPLER ULTRASOUND TECHNIQUE: Gray-scale sonography with graded compression, as well as color Doppler and duplex ultrasound were performed to evaluate the lower extremity deep venous systems from the level of the common femoral vein and including the common femoral, femoral, profunda femoral, popliteal and calf veins including the posterior tibial, peroneal and gastrocnemius veins when visible. The superficial great saphenous vein was also interrogated. Spectral Doppler was utilized to evaluate flow at rest and with distal augmentation maneuvers in the common femoral, femoral and popliteal veins. COMPARISON:  Recent prior lower extremity ultrasound 04/26/2016 FINDINGS: RIGHT LOWER EXTREMITY Common Femoral Vein: No evidence of thrombus. Normal compressibility, respiratory phasicity and response to augmentation. Saphenofemoral Junction: No evidence of thrombus. Normal compressibility and flow on color Doppler imaging. Profunda Femoral Vein: No evidence of thrombus. Normal compressibility and flow on color Doppler imaging. Femoral Vein: No evidence of thrombus. Normal compressibility, respiratory phasicity and response to augmentation. Popliteal Vein: No evidence of thrombus. Normal  compressibility, respiratory phasicity and response to augmentation. Calf Veins: No evidence of thrombus. Normal compressibility and flow on color Doppler imaging. Superficial Great Saphenous Vein: No evidence of thrombus. Normal compressibility and flow on color Doppler imaging. Venous Reflux:  None. Other Findings:  None. LEFT LOWER EXTREMITY Common Femoral Vein: No evidence of thrombus. Normal compressibility, respiratory phasicity and response to augmentation. Saphenofemoral Junction: No evidence of thrombus. Normal compressibility and flow on color Doppler imaging. Profunda Femoral Vein: No evidence of thrombus. Normal compressibility and flow on color Doppler imaging. Femoral Vein: No evidence of thrombus. Normal compressibility, respiratory phasicity and response to augmentation. Popliteal Vein: No evidence of thrombus. Normal compressibility, respiratory phasicity and response to augmentation. Calf Veins: No evidence of thrombus. Normal compressibility and flow on color Doppler imaging. Superficial Great Saphenous Vein: No evidence of thrombus. Normal compressibility and flow on color Doppler imaging. Venous Reflux:  None. Other Findings:  None. IMPRESSION: No evidence of deep venous thrombosis. Electronically Signed   By: Jacqulynn Cadet M.D.   On: 05/08/2016 17:21   US Abdomen Limited Ruq  Result Date: 05/10/2016 CLINICAL DATA:  Elevated transaminase EXAM: US ABDOMEN LIMITED - RIGHT UPPER QUADRANT COMPARISON:  05/09/2016 FINDINGS: Gallbladder: Previous cholecystectomy. Common bile duct: Diameter: 4.1 mm. Liver: Diffusely increased liver echogenicity identified. No focal liver abnormality identified. Trace perihepatic ascites identified. IMPRESSION: 1. Hepatic steatosis 2. Perihepatic ascites. Electronically Signed   By: Kerby Moors M.D.   On: 05/10/2016 10:06    EKG:  Orders placed or performed during the hospital encounter of 04/12/16  . EKG 12-Lead  . EKG 12-Lead  . EKG 12-Lead  . EKG  12-Lead  . EKG    ASSESSMENT AND PLAN:  Active Problems:   Anasarca   Protein-calorie malnutrition, severe   Anorexia #1. Anasarca, likely due to protein calorie malnutrition, hypoalbuminemia. The patient received albumin intravenously over the past 24 hours, now stopped.  She is off IV fluids, blood pressure remains very low. Advance  midodrine, follow blood pressure readings closely, follow clinically, edema  seems to be subsiding. Lower extremity ultrasound is negative for DVT.  #2. Leukocytosis, due to urinary tract infection?, follow with antibiotic therapy tomorrow in the morning #3. Pyuria, concerning for urinary tract infection, urine cultures revealed multiple species, ?contamination, discontinue Rocephin, repeat urinary cultures #4 hypotension, advance midodrine #5 elevated transaminases, LFTs today revealed alkaline phosphatase elevation, about the same as before, elevated AST and ALT, stable, elevated total bilirubin, get direct bilirubin, right upper quadrant abdominal ultrasound, however, was unremarkable, just fatty liver #6 diarrhea, improved with cholestyramine, unable to continue cholestyramine due to concern of partial small bowel obstruction.  #7. Failure to thrive adult, malnutrition, patient's oral intake remains poor, palliative care to discuss with patient and family goals of care.  #8 anemia. With rehydration, stable, follow closely, Hemoccult is negative, transfuse patient as needed, suspect anemia of chronic disease #9. Anorexia, CT scan of abdomen and pelvis again revealed partial small bowel obstruction,  surgeon, saw patient in consultation and felt that surgical and intervention is not feasible. Palliative care to talk to family and patient about goals of care.   Management plans discussed with the patient, family and they are in agreement.   DRUG ALLERGIES: No Known Allergies  CODE STATUS:     Code Status Orders        Start     Ordered   05/08/16 1539   Full code  Continuous     05/08/16 1540    Code Status History    Date Active Date Inactive Code Status Order ID Comments User Context   04/25/2016  3:50 PM 05/01/2016  2:25 PM Full Code [id number]  Fritzi Mandes, MD Inpatient   04/12/2016 12:39 PM 04/20/2016 10:09 PM Full Code [id number]  [name], MD ED   03/25/2016  7:41 PM 03/28/2016  4:21 PM Full Code [id number]  Baxter Hire, MD Inpatient   03/18/2016 11:46 AM 03/23/2016  7:42 PM Full Code [id number]  Lytle Butte, MD ED      TOTAL TIME TAKING CARE OF THIS PATIENT: 40 minutes.    Theodoro Grist M.D on 05/10/2016 at 12:39 PM  Between 7am to 6pm - Pager - [phone]  After 6pm go to [email or web address] - password [email or web address] [facility]  [facility]  Office  [phone]  CC: Primary care physician; [name], MD

## 2016-05-10 NOTE — Care Management (Signed)
Palliative Care referral in progress. Spoke with Mr. Bala at the bedside. States that [facility] is following in the home. Followed by [facility]. Declining Skilled Nursing at this time. [name] RN MSN CCM Care management [phone]

## 2016-05-10 NOTE — Progress Notes (Signed)
Physical Therapy Treatment Patient Details Name: [name] MRN: [id number] DOB: [birth year] Today's Date: 05/10/2016    History of Present Illness Pt admitted for anasarca. Pt with several recent admissions secondary to similar symptoms. Pt with history of endometrial carcinoma and is s/p 2 rounds of chemo. Pt also with colostomy on L secondary to diverticulitis.     PT Comments    Pt is making good progress towards goals with increased participation this date. Mobility not performed secondary to low BP. Pt restless and anxious in bed. Asking for water, however needs assistance for drinking. Pt remains very weak in B UE/LE, however able to participate in more there-ex this date. Pt fatigues quickly during therapy. Still recommending SNF placement, however pt and family are refusing, wanting to dc home from acute care. Educated family on safety and continued physical therapy to progress towards functional outcomes.   Follow Up Recommendations  SNF     Equipment Recommendations       Recommendations for Other Services       Precautions / Restrictions Precautions Precautions: Fall Precaution Comments: LLQ colostomy Restrictions Weight Bearing Restrictions: No    Mobility  Bed Mobility               General bed mobility comments: not performed secondary to low BP at 88/49. Contraindicated at this time  Transfers                    Ambulation/Gait                 Stairs            Wheelchair Mobility    Modified Rankin (Stroke Patients Only)       Balance                                    Cognition Arousal/Alertness: Awake/alert Behavior During Therapy: Anxious Overall Cognitive Status: Impaired/Different from baseline                      Exercises Other Exercises Other Exercises: B supine ther-ex performed including ankle pumps, SLRs, heel slides, and hip abd/add. Pt also performed B UE elbow  flexion/extension. All ther-ex performed x 12 reps with cga and cues for correct technique. Pt required 1 rest break during ther-ex.    General Comments        Pertinent Vitals/Pain Pain Assessment: No/denies pain    Home Living                      Prior Function            PT Goals (current goals can now be found in the care plan section) Acute Rehab PT Goals Patient Stated Goal: to drink water PT Goal Formulation: With patient Time For Goal Achievement: 05/23/16 Potential to Achieve Goals: Fair Progress towards PT goals: Progressing toward goals    Frequency  Min 2X/week    PT Plan Current plan remains appropriate    Co-evaluation             End of Session   Activity Tolerance: Patient limited by fatigue Patient left: in bed;with bed alarm set;with family/visitor present     Time: [id number] PT Time Calculation (min) (ACUTE ONLY): 17 min  Charges:  $Therapeutic Exercise: 8-22 mins  G Codes:      Donavin Audino 05/10/2016, 11:40 AM  Greggory Stallion, PT, DPT [phone]

## 2016-05-10 NOTE — Progress Notes (Signed)
   05/10/16 1100  Clinical Encounter Type  Visited With Patient and family together  Visit Type Initial  Referral From Nurse  Consult/Referral To Chaplain  Patient requested to complete HCPOA and I assisted with the completion of paperwork. I made copies for file, patient, and family. I also gave original to patient as well.   Cass [phone]

## 2016-05-10 NOTE — Consult Note (Signed)
Consultation Note Date: 05/10/2016   Patient Name: [name]  DOB: 1946/05/12  MRN: [id number]  Age / Sex: 70 y.o., female  PCP: Juline Patch, MD Referring Physician: Theodoro Grist, MD  Reason for Consultation: Establishing goals of care and Psychosocial/spiritual support  HPI/Patient Profile: 70 y.o. 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  Admitted for workup and stablization  Husband reports continued physical and functional decline over the past several months but exacerbated by chemo treatments.  He voices concerns regarding if "chemo is really the best thing at this time"  Patient and husband face advanced directive decisions and anticipatory care needs   Clinical Assessment and Goals of Care:   This NP Wadie Lessen reviewed medical records, received report from team, assessed the patient and then meet at the patient's bedside along with her husband  to discuss diagnosis, prognosis, GOC, EOL wishes disposition and options.  A detailed discussion was had today regarding advanced directives.  Concepts specific to code status, artifical feeding and hydration, continued IV antibiotics and rehospitalization was had.  The difference between a aggressive medical intervention path  and a palliative comfort care path for this patient at this time was had.  Concepts of mortality and limitations of medical interventions were addressed  Values and goals of care important to patient and family were attempted to be elicited.  MOST form introduced  Concept of Hospice and  Palliative Care were discussed   Questions and concerns addressed.   Family encouraged to call with questions or concerns.  PMT will continue to support holistically.     SUMMARY OF RECOMMENDATIONS    Code Status/Advance Care Planning:  Full code- encouraged to consider DNR status knowing poor outcomes in similar patients    Symptom Management:   Weakness: PT/OT as tolerated, SNF for rehabilitation on discharge  Palliative Prophylaxis:   Aspiration, Bowel Regimen, Frequent Pain Assessment and Oral Care   Psycho-social/Spiritual:   Desire for further Chaplaincy support:no  Additional Recommendations: Education on Hospice  Prognosis:   Unable to determine, depends on desire for life prolonging measures   Discharge Planning: Likely SNF for rehab, recommend PCS,  To Be Determined      Primary Diagnoses: Present on Admission: . Anasarca   I have reviewed the medical record, interviewed the patient and family, and examined the patient. The following aspects are pertinent.  Past Medical History:  Diagnosis Date  . Cancer [facility]) 2011   endometrial  . Chronic kidney disease    STONES  . Diverticulitis of colon (without mention of hemorrhage)   . History of kidney stones 2011  . Mechanical complication of colostomy and enterostomy   . Obesity, unspecified   . Personal history of tobacco use, presenting hazards  to health    Social History   Social History  . Marital status: Married    Spouse name: N/A  . Number of children: N/A  . Years of education: N/A   Social History Main Topics  . Smoking status: Current Every Day Smoker    Packs/day: 0.25    Years: 15.00    Types: Cigarettes  . Smokeless tobacco: Never Used  . Alcohol use No  . Drug use: No  . Sexual activity: Not Asked   Other Topics Concern  . None   Social History Narrative  . None   Family History  Problem Relation Age of Onset  . Breast cancer Maternal Grandmother   . Colon cancer  Mother   . Cancer Mother    Scheduled Meds: . acetaminophen-codeine  1 tablet Oral TID  . acidophilus  1 capsule Oral BID  . albumin human  25 g Intravenous BID  . budesonide  9 mg Oral [id number]  . calcium carbonate  400 mg of elemental calcium Oral TID WC  . cefTRIAXone (ROCEPHIN)  IV  1 g Intravenous Q24H  . dronabinol  2.5 mg Oral BID AC  . feeding supplement (ENSURE ENLIVE)  237 mL Oral BID BM  . heparin  5,000 Units Subcutaneous Q8H  . magnesium oxide  400 mg Oral BID  . midodrine  5 mg Oral TID WC  . pantoprazole  40 mg Oral Daily   Continuous Infusions:  PRN Meds:.ondansetron Medications Prior to Admission:  Prior to Admission medications   Medication Sig Start Date End Date Taking? Authorizing Provider  acetaminophen-codeine (TYLENOL #3) 300-30 MG tablet Take 1 tablet by mouth 3 (three) times daily. 05/01/16   [place] Lighter, MD  acidophilus (RISAQUAD) CAPS capsule Take 1 capsule by mouth 2 (two) times daily. 05/01/16   [place] Lighter, MD  budesonide (ENTOCORT EC) 3 MG 24 hr capsule Take 3 capsules (9 mg total) by mouth every morning. 05/02/16   [name], MD  calcium carbonate (TUMS - DOSED IN MG ELEMENTAL CALCIUM) 500 MG chewable tablet Chew 2 tablets (400 mg of elemental calcium total) by mouth 3 (three) times daily with meals. 04/20/16   Fritzi Mandes, MD  ENSURE (ENSURE) Take 1 Can by mouth.    Historical Provider, MD  magnesium oxide (MAG-OX) 400 (241.3 Mg) MG tablet Take 1 tablet (400 mg total) by mouth 2 (two) times daily. 04/20/16   Fritzi Mandes, MD  omeprazole (PRILOSEC) 20 MG capsule Take 1 capsule (20 mg total) by mouth daily. 05/02/16   Lequita Asal, MD  ondansetron (ZOFRAN) 8 MG tablet Take 8 mg by mouth 2 (two) times daily as needed.  02/28/16   Historical Provider, MD  potassium chloride (KLOR-CON) 20 MEQ packet Take 20 mEq by mouth 2 (two) times daily. 05/01/16   [place] Lighter, MD  potassium chloride SA (K-DUR,KLOR-CON) 20 MEQ tablet Take 1 tablet (20 mEq  total) by mouth 2 (two) times daily. Patient not taking: Reported on 05/08/2016 05/03/16   Lequita Asal, MD  promethazine (PHENERGAN) 25 MG tablet Take 1 tablet (25 mg total) by mouth every 8 (eight) hours as needed for nausea or vomiting. Patient not taking: Reported on 05/08/2016 04/11/16   Lequita Asal, MD   No Known Allergies Review of Systems  Constitutional: Positive for activity change and fatigue.  Neurological: Positive for weakness.    Physical Exam  Constitutional: She appears lethargic. She appears ill.  HENT:  Mouth/Throat: Mucous membranes are dry.  Cardiovascular:  Normal rate, regular rhythm and normal heart sounds.   Pulmonary/Chest: She has decreased breath sounds in the right lower field and the left lower field.  Abdominal: Soft. Bowel sounds are normal.  Musculoskeletal:  generalized weakness   Neurological: She appears lethargic.  Skin: Skin is warm and dry.    Vital Signs: BP (!) 88/49   Pulse 79   Temp 98.4 F (36.9 C) (Oral)   Resp 20   Ht 5\' 2"  (1.575 m)   Wt 72.1 kg (159 lb)   SpO2 94%   BMI 29.08 kg/m  Pain Assessment: No/denies pain   Pain Score: 0-No pain   SpO2: SpO2: 94 % O2 Device:SpO2: 94 % O2 Flow Rate: .   IO: Intake/output summary:  Intake/Output Summary (Last 24 hours) at 05/10/16 1021 Last data filed at 05/10/16 0600  Gross per 24 hour  Intake              550 ml  Output              150 ml  Net              400 ml    LBM: Last BM Date: 05/09/16 Baseline Weight: Weight: 72.1 kg (159 lb) Most recent weight: Weight: 72.1 kg (159 lb)      Palliative Assessment/Data: 30 % at best   Flowsheet Rows   Flowsheet Row Most Recent Value  Intake Tab  Referral Department  Hospitalist  Unit at Time of Referral  Oncology Unit  Palliative Care Primary Diagnosis  Cancer  Date Notified  05/09/16  Palliative Care Type  New Palliative care  Reason for referral  Clarify Goals of Care  Date of Admission  05/08/16  # of days IP  prior to Palliative referral  1  Clinical Assessment  Psychosocial & Spiritual Assessment  Palliative Care Outcomes     Discussed with Dr Ether Griffins   Time In: 1015 Time Out: 1130 Time Total: 75 min Greater than 50%  of this time was spent counseling and coordinating care related to the above assessment and plan.  Signed by: Wadie Lessen, NP   Please contact Palliative Medicine Team phone at [phone] for questions and concerns.  For individual provider: See Shea Evans

## 2016-05-11 DIAGNOSIS — R740 Nonspecific elevation of levels of transaminase and lactic acid dehydrogenase [LDH]: Secondary | ICD-10-CM

## 2016-05-11 DIAGNOSIS — D649 Anemia, unspecified: Secondary | ICD-10-CM

## 2016-05-11 DIAGNOSIS — N189 Chronic kidney disease, unspecified: Secondary | ICD-10-CM

## 2016-05-11 DIAGNOSIS — N179 Acute kidney failure, unspecified: Secondary | ICD-10-CM

## 2016-05-11 DIAGNOSIS — E46 Unspecified protein-calorie malnutrition: Secondary | ICD-10-CM

## 2016-05-11 DIAGNOSIS — R7401 Elevation of levels of liver transaminase levels: Secondary | ICD-10-CM

## 2016-05-11 DIAGNOSIS — R8281 Pyuria: Secondary | ICD-10-CM

## 2016-05-11 DIAGNOSIS — I959 Hypotension, unspecified: Secondary | ICD-10-CM

## 2016-05-11 DIAGNOSIS — D72829 Elevated white blood cell count, unspecified: Secondary | ICD-10-CM

## 2016-05-11 LAB — BASIC METABOLIC PANEL
Anion gap: 8 (ref 5–15)
BUN: 43 mg/dL — AB (ref 6–20)
CALCIUM: 7.6 mg/dL — AB (ref 8.9–10.3)
CHLORIDE: 109 mmol/L (ref 101–111)
CO2: 22 mmol/L (ref 22–32)
CREATININE: 1.79 mg/dL — AB (ref 0.44–1.00)
GFR calc Af Amer: 32 mL/min — ABNORMAL LOW (ref >60)
GFR calc non Af Amer: 28 mL/min — ABNORMAL LOW (ref >60)
GLUCOSE: 178 mg/dL — AB (ref 65–99)
Potassium: 3.7 mmol/L (ref 3.5–5.1)
SODIUM: 139 mmol/L (ref 135–145)

## 2016-05-11 LAB — HEMOGLOBIN AND HEMATOCRIT, BLOOD
HCT: 29.2 % — ABNORMAL LOW (ref 35.0–47.0)
Hemoglobin: 10 g/dL — ABNORMAL LOW (ref 12.0–16.0)

## 2016-05-11 LAB — URINE CULTURE: CULTURE: NO GROWTH

## 2016-05-11 LAB — HEMOGLOBIN: Hemoglobin: 7.6 g/dL — ABNORMAL LOW (ref 12.0–16.0)

## 2016-05-11 LAB — PREPARE RBC (CROSSMATCH)

## 2016-05-11 MED ORDER — DRONABINOL 2.5 MG PO CAPS: 2.5000 mg | ORAL_CAPSULE | Freq: Two times a day (BID) | ORAL | 5 refills | Status: AC

## 2016-05-11 MED ORDER — SODIUM CHLORIDE 0.9 % IV SOLN
Freq: Once | INTRAVENOUS | Status: AC
  Administered 2016-05-11: 13:00:00 via INTRAVENOUS

## 2016-05-11 MED ORDER — ACETAMINOPHEN-CODEINE #3 300-30 MG PO TABS: 1.0000 | ORAL_TABLET | Freq: Three times a day (TID) | ORAL | 0 refills | Status: AC

## 2016-05-11 MED ORDER — MIDODRINE HCL 10 MG PO TABS: 10.0000 mg | ORAL_TABLET | Freq: Three times a day (TID) | ORAL | 6 refills | Status: AC

## 2016-05-11 MED ORDER — HEPARIN SOD (PORK) LOCK FLUSH 100 UNIT/ML IV SOLN
500.0000 [IU] | Freq: Once | INTRAVENOUS | Status: AC
  Administered 2016-05-11: 18:00:00 500 [IU] via INTRAVENOUS
  Filled 2016-05-11: qty 5

## 2016-05-11 NOTE — NC FL2 (Signed)
[place] LEVEL OF CARE SCREENING TOOL     IDENTIFICATION  Patient Name: [name] Birthdate: [birth year] Sex: female Admission Date (Current Location): 05/08/2016  [place] and [place] Number:  Engineering geologist and Address:  [facility], [address], [place], [place] [address]      Provider Number: [id number]  Attending Physician Name and Address:  Theodoro Grist, MD  Relative Name and Phone Number:       Current Level of Care: Hospital Recommended Level of Care: [place] Prior Approval Number:    Date Approved/Denied:   PASRR Number:  ([id number])  Discharge Plan: SNF    Current Diagnoses: Patient Active Problem List   Diagnosis Date Noted  . Leukocytosis 05/11/2016  . Pyuria, sterile 05/11/2016  . Hypotension 05/11/2016  . Elevated transaminase level 05/11/2016  . Protein-calorie malnutrition ([place]) 05/11/2016  . Anemia 05/11/2016  . Acute on chronic renal failure ([place]) 05/11/2016  . DNR (do not resuscitate) discussion 05/10/2016  . Palliative care by specialist 05/10/2016  . Weakness generalized 05/10/2016  . Dyspnea   . Protein-calorie malnutrition, severe 05/09/2016  . Anorexia   . Failure to thrive in adult 05/08/2016  . Benign neoplasm of transverse colon   . Chronic diarrhea of unknown origin   . Hypocalcemia 04/26/2016  . Anasarca 04/25/2016  . Malnutrition ([place]) 04/25/2016  . Electrolyte disorder   . Thrombocytopenia ([place])   . Leukopenia   . ARF (acute renal failure) ([place]) 04/12/2016  . Bacteriuria with pyuria 03/28/2016  . Acute respiratory failure with hypoxia ([place]) 03/28/2016  . Atelectasis 03/28/2016  . Metabolic acidosis [id number]  . Diarrhea   . Dehydration   . Acute renal failure (ARF) ([place]) 03/18/2016  . Hypomagnesemia 03/07/2016  . Watery stools 01/23/2016  . Generalized abdominal pain 01/23/2016  . Endometrial cancer ([place]) 02/10/2015  . Paroxysmal supraventricular  tachycardia ([place]) 05/24/2014  . History of endometrial cancer 04/09/2014  . Tubular adenomas removed from the transverse colon in June 2010. Hyperplastic polyps identified in the sigmoid colon. 03/02/2014  . History of colon polyps 03/02/2014  . Ventral hernia 09/29/2013  . 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 Complication of external stoma of gastrointestinal tract 02/19/2013    Orientation RESPIRATION BLADDER Height & Weight     Self, Time, Situation, Place  Normal Incontinent Weight: 159 lb (72.1 kg) Height:  5\' 2"  (157.5 cm)  BEHAVIORAL SYMPTOMS/MOOD NEUROLOGICAL BOWEL NUTRITION STATUS   (None)   Continent Diet ( DYS 3)  AMBULATORY STATUS COMMUNICATION OF NEEDS Skin   Extensive Assist Verbally Normal                       Personal Care Assistance Level of Assistance  Bathing, Feeding, Dressing Bathing Assistance: Limited assistance Feeding assistance: Independent Dressing Assistance: Limited assistance     Functional Limitations Info  Sight, Hearing, Speech Sight Info: Adequate Hearing Info: Adequate Speech Info: Adequate    SPECIAL CARE FACTORS FREQUENCY  PT (By licensed PT)     PT Frequency:  (5)              Contractures      Additional Factors Info  Allergies, Code Status Code Status Info:  (Full Code) Allergies Info:  (No Known Allergies)           Current Medications (05/11/2016):  This is the current hospital active medication list Current Facility-Administered Medications  Medication Dose Route Frequency Provider Last Rate Last Dose  .  0.9 %  sodium chloride infusion   Intravenous Once Theodoro Grist, MD      . acetaminophen-codeine (TYLENOL #3) 300-30 MG per tablet 1 tablet  1 tablet Oral Q8H PRN Theodoro Grist, MD      . acidophilus (RISAQUAD) capsule 1 capsule  1 capsule Oral BID [place] Lighter, MD   1 capsule at 05/11/16 0905  . budesonide (ENTOCORT EC) 24 hr capsule 9 mg  9 mg Oral Maury Dus, MD   9 mg at 05/11/16  0541  . calcium carbonate (TUMS - dosed in mg elemental calcium) chewable tablet 400 mg of elemental calcium  400 mg of elemental calcium Oral TID WC [place] Lighter, MD   400 mg of elemental calcium at 05/11/16 0554  . cefTRIAXone (ROCEPHIN) 1 g in dextrose 5 % 50 mL IVPB  1 g Intravenous Q24H Theodoro Grist, MD   1 g at 05/10/16 1246  . dronabinol (MARINOL) capsule 2.5 mg  2.5 mg Oral BID AC Theodoro Grist, MD   2.5 mg at 05/10/16 1748  . feeding supplement (ENSURE ENLIVE) (ENSURE ENLIVE) liquid 237 mL  237 mL Oral BID BM Theodoro Grist, MD   237 mL at 05/11/16 1000  . heparin injection 5,000 Units  5,000 Units Subcutaneous Q8H [place] Lighter, MD   5,000 Units at 05/10/16 2213  . magnesium oxide (MAG-OX) tablet 400 mg  400 mg Oral BID [place] Lighter, MD   400 mg at 05/11/16 0905  . midodrine (PROAMATINE) tablet 10 mg  10 mg Oral TID WC Theodoro Grist, MD   10 mg at 05/11/16 0904  . ondansetron (ZOFRAN) injection 4 mg  4 mg Intravenous Q6H PRN Alexis Hugelmeyer, DO   4 mg at 05/08/16 2242  . pantoprazole (PROTONIX) EC tablet 40 mg  40 mg Oral Daily [place] Lighter, MD   40 mg at 05/11/16 [id number]   Facility-Administered Medications Ordered in Other Encounters  Medication Dose Route Frequency Provider Last Rate Last Dose  . magnesium sulfate IVPB 2 g 50 mL  2 g Intravenous Once Lequita Asal, MD         Discharge Medications: Please see discharge summary for a list of discharge medications.  Relevant Imaging Results:  Relevant Lab Results:   Additional Information  (SSN [id number])  Lorenso Quarry Sheza Strickland, LCSW

## 2016-05-11 NOTE — Progress Notes (Signed)
Pt spouse called at about 2210 asking where was his wife. Per spouse, he was suppose to be informed when his wife was being transferred and no one has called yet. [name] reported he is 70 years old and they should not be transferring her this late and that was foolish, because what if he was blind or something. He also reported that no one from [facility] has called to confirm she is there. He reported he already spoke with his lawyer and he will be suing the hospital. [name] reported he wants a Freight forwarder not a Secondary school teacher to call him back tonight because he is very Administrator, Civil Service. The nursing supervisor was called to inform her of what happen. [facility] was called and Swann at the facility confirmed that the patient was there. Nursing supervisor, [name], report she will call Mr. Leo and speak with him. EMS did not notify the primary nurse that the patient was leaving the floor.

## 2016-05-11 NOTE — Care Management Important Message (Signed)
Important Message  Patient Details  Name: [name] MRN: [id number] Date of Birth: 09-10-1945   Medicare Important Message Given:  Yes    Jolly Mango, RN 05/11/2016, 9:24 AM

## 2016-05-11 NOTE — Progress Notes (Signed)
New referral for Palliative NP to follow at [facility] following discharge received from [place]. [facility] referral made aware. Thank you. Flo Shanks RN, BSN, [place] [facility] [place], [facility] [phone]

## 2016-05-11 NOTE — Progress Notes (Signed)
Pt was discharged to [facility] via EMS at about 2135.

## 2016-05-11 NOTE — Discharge Summary (Addendum)
[facility] [place] NAME: [name]    MR#:  [id number]  DATE OF BIRTH:  [birth year]  DATE OF ADMISSION:  05/08/2016 ADMITTING PHYSICIAN: Gladstone Lighter, MD  DATE OF DISCHARGE: No discharge date for patient encounter.  PRIMARY CARE PHYSICIAN: [name], MD     ADMISSION DIAGNOSIS:  sepsis  failure to thrive dehydration poss dvt Rt leg  DISCHARGE DIAGNOSIS:  Active Problems:   Anasarca   Hypotension   Protein-calorie malnutrition, severe   Anorexia   Dyspnea   Leukocytosis   Pyuria, sterile   Elevated transaminase level   Protein-calorie malnutrition (HCC)   Anemia   Acute on chronic renal failure (HCC)   DNR (do not resuscitate) discussion   Palliative care by specialist   Weakness generalized   SECONDARY DIAGNOSIS:   Past Medical History:  Diagnosis Date  . Cancer [facility]) 2011   endometrial  . Chronic kidney disease    STONES  . Diverticulitis of colon (without mention of hemorrhage)   . History of kidney stones 2011  . Mechanical complication of colostomy and enterostomy   . Obesity, unspecified   . Personal history of tobacco use, presenting hazards to health     .pro HOSPITAL COURSE:   The patient is a 70 year old Caucasian female with past history significant for history of endometrial cancer, CK D, colon diverticulitis, status post colostomy, who presents to the hospital with complaints of worsening lower extremity swelling and was noted to have elevated white blood cell count. She also complained of poor appetite, some dyspnea and orthopnea, she was noted to have 3+ lower extremity edema. Patient's initial chest x-ray showed no acute cardiopulmonary disease. Doppler ultrasound showed no evidence of DVT in bilateral lower extremities. Labs revealed acute on chronic renal failure with creatinine of 1.99, patient was given IV fluids due to hypotension. Initially, her kidney function improved as  well as her blood pressure. With rehydration, however, she was noted to have severe anemia. She is going to be transfused 1 unit of packed red blood cells. She was evaluated by physical therapist and recommended skilled nursing facility placement, where she will be likely discharged today after packed red blood cell transfusion. Patient's labs also revealed pyuria, however microbiology failed to show 1 bacteria, multiple species were noted, recollection was suggested, patient's urine cultures were repeated on 05/10/2016, results were pending by the day of discharge. Patient received antibiotic therapy with Rocephin intravenously while in the hospital. In regards to hypotension, as mentioned above with rehydration, blood pressure improved. Patient was also initiated on midodrine, which was advanced to 10 mg 3 times daily dose. It was felt that patient's hypotension was very likely due to hypoalbuminemia, poor oral intake related. 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 Hepatic stat doses and aortic atherosclerosis. Patient was seen by surgeon, we did not feel that partial small bowel obstruction require surgical therapy, he recommended to continue medical therapy. Patient was initiated on Marinol with improvement of her oral intake overall. She had stool in the colostomy bag, which was no longer diarrheal. Patient was advised to continue budesonide orally to help with diarrhea. Magnesium supplementations were stopped due to concerns of exacerbating diarrhea. While in the hospital patient was also noted to  have elevated LFTs, AST and ALT in the level of 100-150, but no direct bilirubin elevation. Patient was noted to have alkaline phosphatase elevation  to 576, concerning for possible metastatic reasons/bone alkaline phosphatase. Patient had a right upper quadrant abdominal ultrasound, revealing hepatic stat doses and perihepatic ascites. She has been asymptomatic, despite mild elevation of LFTs. Discussion by problem: #1. Anasarca, likely due to protein calorie malnutrition, hypoalbuminemia. The patient received albumin intravenously while in the hospital with improvement of edema. Oral intake has improved with Marinol, with hopes that peripheral swelling will  also subside when protein level improves. Lower extremity ultrasound was negative for DVT.  #2. Leukocytosis, no obvious infection noted, although patient's urinary cultures revealed multiple bacterial organisms, patient received antibiotic therapy while in the hospital, whether cell count has improved, it is recommended to follow patient's white blood cell count as outpatient, it is still possible that patient's white blood cell count was due to oral budesonide  #3. Pyuria, initially concerning for urinary tract infection, urine cultures revealed multiple species, ?contamination,   the patient received  Rocephin while in the hospital , repeated urinary culture is pending, please refer to it if patient develops any signs of infection #4 hypotension, improved on midodrine, will improve with transfusion of packed blood cells, follow closely, wean off midodrine when patient's blood pressure is better.  #5 elevated transaminases, LFTs revealed mild elevation of transaminases to 80 to 140, also significant alkaline phosphatase elevation, about the same as before, suspect bone origin, right upper quadrant abdominal ultrasound, however, was unremarkable, just fatty liver disease #6 diarrhea, improved with cholestyramine, unable to continue cholestyramine due to concern of partial small bowel obstruction, discontinue magnesium as it can exacerbate diarrhea.  #7. Failure to thrive adult, malnutrition,  patient's oral intake  has improved on Marino, follow oral intake closely as outpatient  #8 anemia. With rehydration, multiple level of 7.5 - 7.6  Hemoccult was negative, transfuse patient as packed red blood cells today, follow hemoglobin level as outpatient, suspect anemia of chronic disease, no Epogen per nephrologist due to cancer #9. Anorexia, CT scan of abdomen and pelvis again revealed partial small bowel obstruction,  a surgeon saw patient in consultation and felt that surgical intervention is not needed, medical therapy was recommended. The patient would benefit from palliative care follow-up as outpatient.       Marland Kitchen m: Lequita Asal, MD Munsoor Holley Raring, MD  DRUG ALLERGIES:  No Known Allergies  DISCHARGE MEDICATIONS:   Current Discharge Medication List    START taking these medications   Details  dronabinol (MARINOL) 2.5 MG capsule Take 1 capsule (2.5 mg total) by mouth 2 (two) times daily before lunch and supper. Qty: 60 capsule, Refills: 5    midodrine (PROAMATINE) 10 MG tablet Take 1 tablet (10 mg total) by mouth 3 (three) times daily with meals. Qty: 90 tablet, Refills: 6      CONTINUE these medications which have NOT CHANGED   Details  acetaminophen-codeine (TYLENOL #3) 300-30 MG tablet Take 1 tablet by mouth 3 (three) times daily. Qty: 30 tablet, Refills: 0    acidophilus (RISAQUAD) CAPS capsule Take 1 capsule by mouth 2 (two) times daily. Qty: 60 capsule, Refills: 2    budesonide (ENTOCORT EC) 3 MG 24 hr capsule Take 3 capsules (9 mg total) by mouth every morning. Qty: 90 capsule, Refills: 2    calcium carbonate (TUMS - DOSED IN MG ELEMENTAL CALCIUM) 500 MG chewable tablet Chew 2 tablets (400 mg of elemental  calcium total) by mouth 3 (three) times daily with meals. Qty: 90 tablet, Refills: 1    ENSURE (ENSURE) Take 1 Can by mouth.    omeprazole (PRILOSEC) 20 MG capsule Take 1 capsule (20 mg total) by mouth daily. Qty: 30 capsule, Refills: 3   Associated  Diagnoses: Endometrial cancer ([place])    ondansetron (ZOFRAN) 8 MG tablet Take 8 mg by mouth 2 (two) times daily as needed.     promethazine (PHENERGAN) 25 MG tablet Take 1 tablet (25 mg total) by mouth every 8 (eight) hours as needed for nausea or vomiting. Qty: 30 tablet, Refills: 0      STOP taking these medications     magnesium oxide (MAG-OX) 400 (241.3 Mg) MG tablet      potassium chloride (KLOR-CON) 20 MEQ packet      potassium chloride SA (K-DUR,KLOR-CON) 20 MEQ tablet          Follow-up with primary care physician,  If you experie oncologist, as outpatientnce worsening of your admission symptoms, develop shortness of breath, life threatening emergency, suicidal or homicidal thoughts you must seek medical attention immediately by calling 911 or calling your MD immediately  if symptoms less severe.  You Must read complete instructions/literature along with all the possible adverse reactions/side effects for all the Medicines you take and that have been prescribed to you. Take any new Medicines after you have completely understood and accept all the possible adverse reactions/side effects.   Please note  You were cared for by a hospitalist during your hospital stay. If you have any questions about your discharge medications or the care you received while you were in the hospital after you are discharged, you can call the unit and asked to speak with the hospitalist on call if the hospitalist that took care of you is not available. Once you are discharged, your primary care physician will handle any further medical issues. Please note that NO REFILLS for any discharge medications will be authorized once you are discharged, as it is imperative that you return to your primary care physician (or establish a relationship with a primary care physician if you do not have one) for your aftercare needs so that they can reassess your need for medications and monitor your lab  values.    Today   CHIEF COMPLAINT:  No chief complaint on file.   HISTORY OF PRESENT ILLNESS:  [name]  is a 70 y.o. female with a known history of endometrial cancer, CK D, colon diverticulitis, status post colostomy, who presents to the hospital with complaints of worsening lower extremity swelling and was noted to have elevated white blood cell count. She also complained of poor appetite, some dyspnea and orthopnea, she was noted to have 3+ lower extremity edema. Patient's initial chest x-ray showed no acute cardiopulmonary disease. Doppler ultrasound showed no evidence of DVT in bilateral lower extremities. Labs revealed acute on chronic renal failure with creatinine of 1.99, patient was given IV fluids due to hypotension. Initially, her kidney function improved as well as her blood pressure. With rehydration, however, she was noted to have severe anemia. She is going to be transfused 1 unit of packed red blood cells. She was evaluated by physical therapist and recommended skilled nursing facility placement, where she will be likely discharged today after packed red blood cell transfusion. Patient's labs also revealed pyuria, however microbiology failed to show 1 bacteria, multiple species were noted, recollection was suggested, patient's urine cultures were repeated on 05/10/2016, results  were pending by the day of discharge. Patient received antibiotic therapy with Rocephin intravenously while in the hospital. In regards to hypotension, as mentioned above with rehydration, blood pressure improved. Patient was also initiated on midodrine, which was advanced to 10 mg 3 times daily dose. It was felt that patient's hypotension was very likely due to hypoalbuminemia, poor oral intake related. 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 Hepatic stat doses and aortic atherosclerosis. Patient was seen by surgeon, we did not feel that partial small bowel obstruction require surgical therapy, he recommended to continue medical therapy. Patient was initiated on Marinol with improvement of her oral intake overall. She had stool in the colostomy bag, which was no longer diarrheal. Patient was advised to continue budesonide orally to help with diarrhea. Magnesium supplementations were stopped due to concerns of exacerbating diarrhea. While in the hospital patient was also noted to have elevated LFTs, AST and ALT in the level of 100-150, but no direct bilirubin elevation. Patient was noted to have alkaline phosphatase elevation to 576, concerning for possible metastatic reasons/bone alkaline phosphatase. Patient had a right upper quadrant abdominal ultrasound, revealing hepatic stat doses and perihepatic ascites. She has been asymptomatic, despite mild elevation of LFTs. Discussion by problem: #1. Anasarca, likely due to protein calorie malnutrition, hypoalbuminemia. The patient received albumin intravenously while in the hospital with improvement of edema. Oral intake has improved with Marinol, with hopes that peripheral swelling will  also subside when protein level improves. Lower extremity ultrasound was negative for DVT.  #2. Leukocytosis, no obvious infection noted, although patient's urinary cultures revealed multiple bacterial organisms, patient received antibiotic therapy while in the hospital, whether cell count has improved, it is recommended to follow patient's white blood cell count as outpatient, it is still possible that patient's white blood cell count was due to oral budesonide  #3. Pyuria, initially concerning for urinary tract infection, urine cultures revealed multiple species, ?contamination,   the patient received  Rocephin  while in the hospital , repeated urinary culture is pending, please refer to it if patient develops any signs of infection #4 hypotension, improved on midodrine, will improve with transfusion of packed blood cells, follow closely, wean off midodrine when patient's blood pressure is better.  #5 elevated transaminases, LFTs revealed mild elevation of transaminases to 80 to 140, also significant alkaline phosphatase elevation, about the same as before, suspect bone origin, right upper quadrant abdominal ultrasound, however, was unremarkable, just fatty liver disease #6 diarrhea, improved with cholestyramine, unable to continue cholestyramine due to concern of partial small bowel obstruction, discontinue magnesium as it can exacerbate diarrhea.  #7. Failure to thrive adult, malnutrition, patient's oral intake  has improved on Marino, follow oral intake closely as outpatient  #8 anemia. With rehydration, multiple level of 7.5 - 7.6  Hemoccult was negative, transfuse patient as packed red blood cells today, follow hemoglobin level as outpatient, suspect anemia of chronic disease, no Epogen per nephrologist due to cancer #9. Anorexia, CT scan of abdomen and pelvis again revealed partial small bowel obstruction,  a surgeon saw patient in consultation and felt that surgical intervention is not needed, medical therapy was recommended. The patient would benefit from palliative care follow-up as outpatient.     VITAL SIGNS:  Blood pressure 114/76, pulse  75, temperature 97.8 F (36.6 C), temperature source Oral, resp. rate (!) 24, height 5\' 2"  (1.575 m), weight 72.1 kg (159 lb), SpO2 98 %.  I/O:   Intake/Output Summary (Last 24 hours) at 05/11/16 1127 Last data filed at 05/11/16 0900  Gross per 24 hour  Intake              700 ml  Output              350 ml  Net              350 ml    PHYSICAL EXAMINATION:  GENERAL:  70 y.o.-year-old patient lying in the bed with no acute distress.  EYES: Pupils equal,  round, reactive to light and accommodation. No scleral icterus. Extraocular muscles intact.  HEENT: Head atraumatic, normocephalic. Oropharynx and nasopharynx clear.  NECK:  Supple, no jugular venous distention. No thyroid enlargement, no tenderness.  LUNGS: Normal breath sounds bilaterally, no wheezing, rales,rhonchi or crepitation. No use of accessory muscles of respiration.  CARDIOVASCULAR: S1, S2 normal. No murmurs, rubs, or gallops.  ABDOMEN: Soft, non-tender, non-distended. Bowel sounds present. No organomegaly or mass.  EXTREMITIES: No pedal edema, cyanosis, or clubbing.  NEUROLOGIC: Cranial nerves II through XII are intact. Muscle strength 5/5 in all extremities. Sensation intact. Gait not checked.  PSYCHIATRIC: The patient is alert and oriented x 3.  SKIN: No obvious rash, lesion, or ulcer.   DATA REVIEW:   CBC  Recent Labs Lab 05/10/16 0533 05/11/16 1113  WBC 12.3*  --   HGB 7.5* 7.6*  HCT 22.4*  --   PLT 169  --     Chemistries   Recent Labs Lab 05/08/16 1107  05/09/16 1423 05/10/16 0539  NA 135  < > 142 143  K 4.9  < > 4.6 4.5  CL 109  < > 114* 114*  CO2 19*  < > 21* 21*  GLUCOSE 128*  < > 105* 88  BUN 48*  < > 46* 44*  CREATININE 1.99*  < > 1.84* 1.85*  CALCIUM 7.1*  < > 7.2* 7.7*  MG 1.9  --   --   --   AST 147*  --  133*  --   ALT 82*  --  80*  --   ALKPHOS 576*  --  545*  --   BILITOT 1.0  --  1.8*  --   < > = values in this interval not displayed.  Cardiac Enzymes No results for input(s): TROPONINI in the last 168 hours.  Microbiology Results  Results for orders placed or performed during the hospital encounter of 05/08/16  Urine culture     Status: Abnormal   Collection Time: 05/08/16  6:59 PM  Result Value Ref Range Status   Specimen Description URINE, CATHETERIZED  Final   Special Requests NONE  Final   Culture MULTIPLE SPECIES PRESENT, SUGGEST RECOLLECTION (A)  Final   Report Status 05/10/2016 FINAL  Final    RADIOLOGY:  Ct Abdomen  Pelvis Wo Contrast  Result Date: 05/09/2016 CLINICAL DATA:  Anorexia, fatigue and weight loss.  Diarrhea EXAM: CT ABDOMEN AND PELVIS WITHOUT CONTRAST TECHNIQUE: Multidetector CT imaging of the abdomen and pelvis was performed following the standard protocol without IV contrast. COMPARISON:  02/06/2016 FINDINGS: Lower chest: Pleuro parenchymal scarring and thickening identified within the right base. Hepatobiliary: Marked hepatic steatosis is identified. No focal liver abnormality. Pancreas: The pancreas appears normal. Spleen: The spleen appears normal. Adrenals/Urinary Tract: Normal adrenal glands.  Numerous bilateral renal cysts are again identified. These are of varying size, attenuation and complexity. These are all incompletely characterized without IV contrast. Nonobstructing calculi are again noted within the inferior pole the right kidney. These measure up to 3 mm, image 38 of series 2. No hydronephrosis. The urinary bladder appears normal for degree of distention. Stomach/Bowel: Normal appearance of the stomach. Increase caliber of the small bowel loops are identified which measure up to 4.9 cm. When compared with the examination dated 02/06/2016 the degree of small-bowel distention is similar. Similar transition point within the left lower quadrant of the abdomen, image 57 of series 2. No pathologic dilatation of the colon. There is a left lower quadrant colostomy. Hartman's pouch is identified within the pelvis and is unremarkable. Vascular/Lymphatic: Calcified atherosclerotic disease involves the abdominal aorta. No aneurysm. Periaortic adenopathy is again noted. Index node measures 1.1 cm, image 29 of series 2. This is decreased from 1.9 cm previously. The lymph node mass within the right pelvic sidewall has a short axis of 1.9 cm, image 62 of series 2. Previously 2.9 cm. Reproductive: Previous hysterectomy. Other: Trace ascites identified within the upper abdomen. Musculoskeletal: Asymmetric sclerosis  involving the right iliac bone is identified and is suspicious for metastatic disease. This is unchanged from previous exam. IMPRESSION: 1. Examination is positive for recurrent small bowel dilatation compatible with partial small bowel obstruction. 2. Since the previous exam there has been decrease in size of periaortic adenopathy and right pelvic sidewall adenopathy. Findings are compatible with partial response to therapy. 3. 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 4. Hepatic steatosis 5. Aortic atherosclerosis. Electronically Signed   By: Kerby Moors M.D.   On: 05/09/2016 13:11   US Abdomen Limited Ruq  Result Date: 05/10/2016 CLINICAL DATA:  Elevated transaminase EXAM: US ABDOMEN LIMITED - RIGHT UPPER QUADRANT COMPARISON:  05/09/2016 FINDINGS: Gallbladder: Previous cholecystectomy. Common bile duct: Diameter: 4.1 mm. Liver: Diffusely increased liver echogenicity identified. No focal liver abnormality identified. Trace perihepatic ascites identified. IMPRESSION: 1. Hepatic steatosis 2. Perihepatic ascites. Electronically Signed   By: Kerby Moors M.D.   On: 05/10/2016 10:06    EKG:   Orders placed or performed during the hospital encounter of 04/12/16  . EKG 12-Lead  . EKG 12-Lead  . EKG 12-Lead  . EKG 12-Lead  . EKG      Management plans discussed with the patient, family and they are in agreement.  CODE STATUS:     Code Status Orders        Start     Ordered   05/08/16 1539  Full code  Continuous     05/08/16 1540    Code Status History    Date Active Date Inactive Code Status Order ID Comments User Context   04/25/2016  3:50 PM 05/01/2016  2:25 PM Full Code [id number]  Fritzi Mandes, MD Inpatient   04/12/2016 12:39 PM 04/20/2016 10:09 PM Full Code [id number]  [name], MD ED   03/25/2016  7:41 PM 03/28/2016  4:21 PM Full Code [id number]  Baxter Hire, MD Inpatient   03/18/2016 11:46 AM 03/23/2016  7:42 PM Full Code [id number]  Lytle Butte, MD ED      TOTAL  TIME TAKING CARE OF THIS PATIENT: 72minutes.    Theodoro Grist M.D on 05/11/2016 at 11:27 AM  Between 7am to 6pm - Pager - [phone]  After 6pm go to [email or web address] - password [email or web address] [facility]  [facility]  Office  [phone]  CC: Primary care  physician; [name], MD

## 2016-05-11 NOTE — Progress Notes (Signed)
Subjective:  Creatinine slightly lower today at 1.79. BUN remains high at 43. Patient resting comfortably in bed. Husband at bedside.   Objective:  Vital signs in last 24 hours:  Temp:  [97.8 F (36.6 C)-98.7 F (37.1 C)] 98.7 F (37.1 C) (09/08 1336) Pulse Rate:  [65-85] 65 (09/08 1336) Resp:  [16-24] 16 (09/08 1336) BP: (114-121)/(67-76) 119/71 (09/08 1336) SpO2:  [98 %] 98 % (09/08 1336)  Weight change:  Filed Weights   05/08/16 2030  Weight: 72.1 kg (159 lb)    Intake/Output:    Intake/Output Summary (Last 24 hours) at 05/11/16 1548 Last data filed at 05/11/16 0900  Gross per 24 hour  Intake              530 ml  Output              350 ml  Net              180 ml     Physical Exam: General: No acute distress, laying in the bed   HEENT Moist oral mucous membranes   Neck Supple   Pulm/lungs Clear to auscultation bilateral, normal effort  CVS/Heart S1S2 no rubs  Abdomen:  Soft, nontender, nondistended, colostomy in place   Extremities: Trace b/l LE edema  Neurologic: Alert, oriented x 3, following commands  Skin: Dry skin on b/l LE's          Basic Metabolic Panel:   Recent Labs Lab 05/08/16 1107 05/09/16 0436 05/09/16 1423 05/10/16 0539 05/11/16 1113  NA 135 140 142 143 139  K 4.9 4.6 4.6 4.5 3.7  CL 109 111 114* 114* 109  CO2 19* 22 21* 21* 22  GLUCOSE 128* 87 105* 88 178*  BUN 48* 45* 46* 44* 43*  CREATININE 1.99* 1.82* 1.84* 1.85* 1.79*  CALCIUM 7.1* 7.0* 7.2* 7.7* 7.6*  MG 1.9  --   --   --   --   PHOS  --   --  3.1  --   --      CBC:  Recent Labs Lab 05/08/16 1107 05/09/16 0436 05/10/16 0533 05/11/16 1113  WBC 19.6* 11.2* 12.3*  --   NEUTROABS 16.9*  --   --   --   HGB 8.6* 7.7* 7.5* 7.6*  HCT 26.3* 22.9* 22.4*  --   MCV 95.8 95.2 94.0  --   PLT 241 189 169  --       Microbiology:  Recent Results (from the past 720 hour(s))  Urine culture     Status: Abnormal   Collection Time: 05/08/16  6:59 PM  Result Value Ref  Range Status   Specimen Description URINE, CATHETERIZED  Final   Special Requests NONE  Final   Culture MULTIPLE SPECIES PRESENT, SUGGEST RECOLLECTION (A)  Final   Report Status 05/10/2016 FINAL  Final  Urine culture     Status: None   Collection Time: 05/10/16  4:25 PM  Result Value Ref Range Status   Specimen Description URINE, CATHETERIZED  Final   Special Requests NONE  Final   Culture NO GROWTH Performed at [facility]   Final   Report Status 05/11/2016 FINAL  Final    Coagulation Studies: No results for input(s): LABPROT, INR in the last 72 hours.  Urinalysis:  Recent Labs  05/08/16 1859  COLORURINE YELLOW*  LABSPEC 1.018  PHURINE 5.0  GLUCOSEU NEGATIVE  HGBUR 3+*  BILIRUBINUR NEGATIVE  KETONESUR NEGATIVE  PROTEINUR 30*  NITRITE NEGATIVE  LEUKOCYTESUR  2+*      Imaging: US Abdomen Limited Ruq  Result Date: 05/10/2016 CLINICAL DATA:  Elevated transaminase EXAM: US ABDOMEN LIMITED - RIGHT UPPER QUADRANT COMPARISON:  05/09/2016 FINDINGS: Gallbladder: Previous cholecystectomy. Common bile duct: Diameter: 4.1 mm. Liver: Diffusely increased liver echogenicity identified. No focal liver abnormality identified. Trace perihepatic ascites identified. IMPRESSION: 1. Hepatic steatosis 2. Perihepatic ascites. Electronically Signed   By: Kerby Moors M.D.   On: 05/10/2016 10:06     Medications:     . acidophilus  1 capsule Oral BID  . budesonide  9 mg Oral BH-[id number]  . calcium carbonate  400 mg of elemental calcium Oral TID WC  . cefTRIAXone (ROCEPHIN)  IV  1 g Intravenous Q24H  . dronabinol  2.5 mg Oral BID AC  . feeding supplement (ENSURE ENLIVE)  237 mL Oral BID BM  . heparin  5,000 Units Subcutaneous Q8H  . magnesium oxide  400 mg Oral BID  . midodrine  10 mg Oral TID WC  . 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  1.  Acute renal failure on chronic kidney disease stage III: creatinine fluctuates with fluid status.  Creatinine has recently increased likely secondary to decreased PO intake, states colostomy output has actually decreased. - creatinine currently 1.79.  This is still above her baseline creatinine.  We have continued to encourage the patient to maintain adequate fluid hydration status.  Continue to monitor renal status daily.  2.  Increased lower extremity edema.  ower extremity edema improved post albumin infusion.  Continue to clinically monitored.  3. Anemia of chronic kidney disease. Hemoglobin remains low at 7.6.  We have been avoiding Epogen given history of endometrial cancer.  It appears that the patient is opting for palliative care.      LOS: 3 Nayeli Calvert 9/8/20173:48 PM

## 2016-05-11 NOTE — Progress Notes (Signed)
Called report to RN at [facility] patient going to room 505.

## 2016-05-11 NOTE — Progress Notes (Signed)
Physical Therapy Treatment Patient Details Name: [name] MRN: [id number] DOB: August 24, 1946 Today's Date: 05/11/2016    History of Present Illness Pt admitted for anasarca. Pt with several recent admissions secondary to similar symptoms. Pt with history of endometrial carcinoma and is s/p 2 rounds of chemo. Pt also with colostomy on L secondary to diverticulitis.     PT Comments    Pt is making good progress towards goals with increased mobility noted this date. Pt required +2 assist for mobility secondary to weakness. Pt with improved cognition and able to repeat HEP and initiate exercise. Pt appears motivated to perform therapy. R LE appears weaker with functional movement compared to L LE. Pt really wanting to dc home despite education on SNF. Will continue efforts to improve functional mobility.  Follow Up Recommendations  SNF     Equipment Recommendations       Recommendations for Other Services       Precautions / Restrictions Precautions Precautions: Fall Precaution Comments: LLQ colostomy Restrictions Weight Bearing Restrictions: No    Mobility  Bed Mobility Overal bed mobility: Needs Assistance;+2 for physical assistance Bed Mobility: Supine to Sit     Supine to sit: Mod assist     General bed mobility comments: assist for bed mobility including sliding B LE off bed and scooting out towards EOB. Once seated at EOB, Pt able to sit with cga. Verbal cues required for initiation of movement  Transfers Overall transfer level: Needs assistance Equipment used: Rolling walker (2 wheeled) Transfers: Sit to/from Stand Sit to Stand: Mod assist         General transfer comment: assist + 2 for standing. Cues given for upright posture.   Ambulation/Gait Ambulation/Gait assistance: Min assist;+2 physical assistance Ambulation Distance (Feet): 3 Feet Assistive device: Rolling walker (2 wheeled) Gait Pattern/deviations: Step-to pattern     General Gait  Details: Pt able to ambulate to chair this date, however was unsteady and needs cues for direction and safe technique. No buckling noted, however R LE appears weaker compared to L LE.    Stairs            Wheelchair Mobility    Modified Rankin (Stroke Patients Only)       Balance                                    Cognition Arousal/Alertness: Awake/alert Behavior During Therapy: Anxious Overall Cognitive Status: Within Functional Limits for tasks assessed                      Exercises Other Exercises Other Exercises: B supine ther-ex performed including SLRs, hip abd/add, SAQ, and ankle pumps. All ther-ex performed x 12-15 reps with supervision on L LE and cga on R LE.    General Comments        Pertinent Vitals/Pain Pain Assessment: No/denies pain    Home Living                      Prior Function            PT Goals (current goals can now be found in the care plan section) Acute Rehab PT Goals Patient Stated Goal: to drink water PT Goal Formulation: With patient Time For Goal Achievement: 05/23/16 Potential to Achieve Goals: Fair Progress towards PT goals: Progressing toward goals    Frequency  Min 2X/week  PT Plan Current plan remains appropriate    Co-evaluation             End of Session Equipment Utilized During Treatment: Gait belt Activity Tolerance: Patient limited by fatigue Patient left: in chair;with chair alarm set     Time: [phone] PT Time Calculation (min) (ACUTE ONLY): 14 min  Charges:  $Therapeutic Exercise: 8-22 mins                    G Codes:      Kawanna Christley 2016-05-27, 10:43 AM  Greggory Stallion, PT, DPT [phone]

## 2016-05-11 NOTE — Clinical Social Work Note (Signed)
Clinical Social Work Assessment  Patient Details  Name: [name] MRN: [id number] Date of Birth: 05/04/1946  Date of referral:  05/11/16               Reason for consult:  Discharge Planning                Permission sought to share information with:  Family Supports Permission granted to share information::  Yes, Verbal Permission Granted  Name::        Agency::     Relationship::   (Husband)  Contact Information:     Housing/Transportation Living arrangements for the past 2 months:  Single Family Home Source of Information:  Patient Patient Interpreter Needed:  None Criminal Activity/Legal Involvement Pertinent to Current Situation/Hospitalization:  No - Comment as needed Significant Relationships:  Spouse Lives with:  Spouse Do you feel safe going back to the place where you live?  No Need for family participation in patient care:  Yes (Comment) (Husband)  Care giving concerns:  Patient and her husband changed their mind and decided they wanted SNF placement.    Social Worker assessment / plan:  CSW met with patient and her husband at bedside. CSW introduced herself and her role. Per patient and her husband they do not feel that patient is safe to go home. Stated they would like rehab placement. Stated they lived in [place] and would like a facility close to home. Requested EMS to transport Granted CSW verbal permission to send SNF referrals to SNFs in [place]. Preference [facility] and [facility]. FL2/ PASRR completed and SNF referral sent.  CSW presented bed offers. Accepted bed at [facility]. CSW informed Broadus John- Admissions Coordinator at [facility]. Clinical Social Worker was informed that patient will be medically ready to discharge to [facility]. Patient and her husband are in a agreement with plan. CSW called Broadus John- Admissions Coordinator at [facility] to confirm that patient's bed is ready. Provided patient's room number 505 and number to call for report Theressa Stamps [phone] . All  discharge information faxed to [facility] via [facility]. RN will call report and patient will discharge to [facility] via EMS    Employment status:  Retired Forensic scientist:  Medicare PT Recommendations:  [place] / Referral to community resources:  [place]  Patient/Family's Response to care:  Patient and her husband are in agreement to discharge to [facility] today  Patient/Family's Understanding of and Emotional Response to Diagnosis, Current Treatment, and Prognosis:  Patient and her husband report they understand patient's Diagnosis, Current Treatment, and Prognosis. Thanked CSW for assistance.   Emotional Assessment Appearance:  Appears stated age Attitude/Demeanor/Rapport:   (None) Affect (typically observed):  Accepting, Calm, Pleasant Orientation:  Oriented to Self, Oriented to Place, Oriented to  Time, Oriented to Situation Alcohol / Substance use:  Not Applicable Psych involvement (Current and /or in the community):  No (Comment)  Discharge Needs  Concerns to be addressed:  Discharge Planning Concerns Readmission within the last 30 days:  No Current discharge risk:  Chronically ill Barriers to Discharge:  Continued Medical Work up   [employer], LCSW 05/11/2016, 2:15 PM

## 2016-05-11 NOTE — Care Management (Signed)
Spoke with Spouse and patient. Patient wants to go to rehab. CSW notified.

## 2016-05-11 NOTE — Progress Notes (Signed)
Per MD ok to get post blood H&H in 20-30 minutes

## 2016-05-11 NOTE — Clinical Social Work Placement (Signed)
   CLINICAL SOCIAL WORK PLACEMENT  NOTE  Date:  05/11/2016  Patient Details  Name: [name] MRN: [id number] Date of Birth: May 16, 1946  Clinical Social Work is seeking post-discharge placement for this patient at the [place] level of care (*CSW will initial, date and re-position this form in  chart as items are completed):  Yes   Patient/family provided with [facility] Work Department's list of facilities offering this level of care within the geographic area requested by the patient (or if unable, by the patient's family).  Yes   Patient/family informed of their freedom to choose among providers that offer the needed level of care, that participate in Medicare, Medicaid or managed care program needed by the patient, have an available bed and are willing to accept the patient.  Yes   Patient/family informed of [facility]'s ownership interest in [facility] and [facility], as well as of the fact that they are under no obligation to receive care at these facilities.  PASRR submitted to [facility] on       PASRR number received on       Existing PASRR number confirmed on 05/11/16     FL2 transmitted to all facilities in geographic area requested by pt/family on 05/11/16     FL2 transmitted to all facilities within larger geographic area on       Patient informed that his/her managed care company has contracts with or will negotiate with certain facilities, including the following:        Yes   Patient/family informed of bed offers received.  Patient chooses bed at  ([facility])     Physician recommends and patient chooses bed at      Patient to be transferred to  ([facility]) on 05/11/16.  Patient to be transferred to facility by  (EMS)     Patient family notified on 05/11/16 of transfer.  Name of family member notified:   (Husband)     PHYSICIAN       Additional Comment:    _______________________________________________ Baldemar Lenis, LCSW 05/11/2016, 3:15 PM

## 2016-05-12 LAB — TYPE AND SCREEN
ABO/RH(D): O POS
ANTIBODY SCREEN: NEGATIVE
UNIT DIVISION: 0

## 2016-05-15 ENCOUNTER — Encounter: Admission: RE | Payer: Self-pay | Source: Ambulatory Visit

## 2016-05-15 ENCOUNTER — Inpatient Hospital Stay: Payer: Medicare Other | Admitting: Family Medicine

## 2016-05-15 ENCOUNTER — Ambulatory Visit: Admission: RE | Admit: 2016-05-15 | Payer: Medicare Other | Source: Ambulatory Visit | Admitting: Gastroenterology

## 2016-05-15 SURGERY — COLONOSCOPY WITH PROPOFOL
Anesthesia: General

## 2016-05-18 ENCOUNTER — Emergency Department: Payer: Medicare Other

## 2016-05-18 ENCOUNTER — Inpatient Hospital Stay: Payer: Medicare Other | Admitting: Family Medicine

## 2016-05-18 ENCOUNTER — Inpatient Hospital Stay
Admission: EM | Admit: 2016-05-18 | Discharge: 2016-06-03 | DRG: 871 | Disposition: E | Payer: Medicare Other | Attending: Internal Medicine | Admitting: Internal Medicine

## 2016-05-18 ENCOUNTER — Other Ambulatory Visit: Payer: Self-pay

## 2016-05-18 ENCOUNTER — Encounter: Payer: Self-pay | Admitting: Emergency Medicine

## 2016-05-18 ENCOUNTER — Inpatient Hospital Stay: Payer: Medicare Other

## 2016-05-18 DIAGNOSIS — Z9221 Personal history of antineoplastic chemotherapy: Secondary | ICD-10-CM

## 2016-05-18 DIAGNOSIS — Z8542 Personal history of malignant neoplasm of other parts of uterus: Secondary | ICD-10-CM | POA: Diagnosis not present

## 2016-05-18 DIAGNOSIS — K760 Fatty (change of) liver, not elsewhere classified: Secondary | ICD-10-CM | POA: Diagnosis present

## 2016-05-18 DIAGNOSIS — K56609 Unspecified intestinal obstruction, unspecified as to partial versus complete obstruction: Secondary | ICD-10-CM

## 2016-05-18 DIAGNOSIS — C799 Secondary malignant neoplasm of unspecified site: Secondary | ICD-10-CM | POA: Diagnosis present

## 2016-05-18 DIAGNOSIS — R579 Shock, unspecified: Secondary | ICD-10-CM | POA: Diagnosis not present

## 2016-05-18 DIAGNOSIS — Z515 Encounter for palliative care: Secondary | ICD-10-CM | POA: Diagnosis present

## 2016-05-18 DIAGNOSIS — E860 Dehydration: Secondary | ICD-10-CM

## 2016-05-18 DIAGNOSIS — N189 Chronic kidney disease, unspecified: Secondary | ICD-10-CM | POA: Diagnosis present

## 2016-05-18 DIAGNOSIS — R571 Hypovolemic shock: Secondary | ICD-10-CM | POA: Diagnosis present

## 2016-05-18 DIAGNOSIS — N390 Urinary tract infection, site not specified: Secondary | ICD-10-CM

## 2016-05-18 DIAGNOSIS — Z87442 Personal history of urinary calculi: Secondary | ICD-10-CM | POA: Diagnosis not present

## 2016-05-18 DIAGNOSIS — Z66 Do not resuscitate: Secondary | ICD-10-CM | POA: Diagnosis present

## 2016-05-18 DIAGNOSIS — C541 Malignant neoplasm of endometrium: Secondary | ICD-10-CM | POA: Diagnosis present

## 2016-05-18 DIAGNOSIS — Z7952 Long term (current) use of systemic steroids: Secondary | ICD-10-CM

## 2016-05-18 DIAGNOSIS — Z79899 Other long term (current) drug therapy: Secondary | ICD-10-CM | POA: Diagnosis not present

## 2016-05-18 DIAGNOSIS — A419 Sepsis, unspecified organism: Principal | ICD-10-CM

## 2016-05-18 DIAGNOSIS — Z79891 Long term (current) use of opiate analgesic: Secondary | ICD-10-CM

## 2016-05-18 DIAGNOSIS — F17210 Nicotine dependence, cigarettes, uncomplicated: Secondary | ICD-10-CM | POA: Diagnosis present

## 2016-05-18 DIAGNOSIS — N170 Acute kidney failure with tubular necrosis: Secondary | ICD-10-CM | POA: Diagnosis present

## 2016-05-18 DIAGNOSIS — Z4659 Encounter for fitting and adjustment of other gastrointestinal appliance and device: Secondary | ICD-10-CM

## 2016-05-18 DIAGNOSIS — Z90710 Acquired absence of both cervix and uterus: Secondary | ICD-10-CM

## 2016-05-18 DIAGNOSIS — K5669 Other intestinal obstruction: Secondary | ICD-10-CM | POA: Diagnosis present

## 2016-05-18 DIAGNOSIS — Z933 Colostomy status: Secondary | ICD-10-CM | POA: Diagnosis not present

## 2016-05-18 DIAGNOSIS — N179 Acute kidney failure, unspecified: Secondary | ICD-10-CM

## 2016-05-18 DIAGNOSIS — E43 Unspecified severe protein-calorie malnutrition: Secondary | ICD-10-CM | POA: Diagnosis present

## 2016-05-18 DIAGNOSIS — R627 Adult failure to thrive: Secondary | ICD-10-CM | POA: Diagnosis present

## 2016-05-18 LAB — URINALYSIS COMPLETE WITH MICROSCOPIC (ARMC ONLY)
Glucose, UA: NEGATIVE mg/dL
Ketones, ur: NEGATIVE mg/dL
Nitrite: NEGATIVE
PROTEIN: 100 mg/dL — AB
Specific Gravity, Urine: 1.02 (ref 1.005–1.030)
pH: 5 (ref 5.0–8.0)

## 2016-05-18 LAB — BLOOD GAS, VENOUS
ACID-BASE DEFICIT: 6.1 mmol/L — AB (ref 0.0–2.0)
BICARBONATE: 18.8 mmol/L — AB (ref 20.0–28.0)
Patient temperature: 37
pCO2, Ven: 34 mmHg — ABNORMAL LOW (ref 44.0–60.0)
pH, Ven: 7.35 (ref 7.250–7.430)
pO2, Ven: <31 mmHg — CL (ref 32.0–45.0)

## 2016-05-18 LAB — CBC WITH DIFFERENTIAL/PLATELET
BASOS ABS: 0.1 10*3/uL (ref 0–0.1)
Basophils Relative: 1 %
EOS PCT: 0 %
Eosinophils Absolute: 0 10*3/uL (ref 0–0.7)
HEMATOCRIT: 32.7 % — AB (ref 35.0–47.0)
Hemoglobin: 10.7 g/dL — ABNORMAL LOW (ref 12.0–16.0)
LYMPHS ABS: 2.8 10*3/uL (ref 1.0–3.6)
Lymphocytes Relative: 20 %
MCH: 30.5 pg (ref 26.0–34.0)
MCHC: 32.9 g/dL (ref 32.0–36.0)
MCV: 92.9 fL (ref 80.0–100.0)
MONOS PCT: 8 %
Monocytes Absolute: 1.1 10*3/uL — ABNORMAL HIGH (ref 0.2–0.9)
NEUTROS ABS: 10.2 10*3/uL — AB (ref 1.4–6.5)
Neutrophils Relative %: 71 %
Platelets: 102 10*3/uL — ABNORMAL LOW (ref 150–440)
RBC: 3.52 MIL/uL — ABNORMAL LOW (ref 3.80–5.20)
RDW: 18.6 % — AB (ref 11.5–14.5)
WBC: 14.2 10*3/uL — ABNORMAL HIGH (ref 3.6–11.0)

## 2016-05-18 LAB — LACTIC ACID, PLASMA
Lactic Acid, Venous: 2.1 mmol/L (ref 0.5–1.9)
Lactic Acid, Venous: 1.6 mmol/L (ref 0.5–1.9)

## 2016-05-18 LAB — MRSA PCR SCREENING: MRSA BY PCR: NEGATIVE

## 2016-05-18 LAB — TROPONIN I: TROPONIN I: 0.09 ng/mL — AB (ref <0.03)

## 2016-05-18 LAB — COMPREHENSIVE METABOLIC PANEL
ALT: 48 U/L (ref 14–54)
AST: 60 U/L — AB (ref 15–41)
Albumin: 2.1 g/dL — ABNORMAL LOW (ref 3.5–5.0)
Alkaline Phosphatase: 801 U/L — ABNORMAL HIGH (ref 38–126)
Anion gap: 12 (ref 5–15)
BILIRUBIN TOTAL: 5.4 mg/dL — AB (ref 0.3–1.2)
BUN: 65 mg/dL — AB (ref 6–20)
CO2: 21 mmol/L — ABNORMAL LOW (ref 22–32)
CREATININE: 3.51 mg/dL — AB (ref 0.44–1.00)
Calcium: 7.8 mg/dL — ABNORMAL LOW (ref 8.9–10.3)
Chloride: 101 mmol/L (ref 101–111)
GFR calc Af Amer: 14 mL/min — ABNORMAL LOW (ref >60)
GFR, EST NON AFRICAN AMERICAN: 12 mL/min — AB (ref >60)
GLUCOSE: 67 mg/dL (ref 65–99)
Potassium: 5.6 mmol/L — ABNORMAL HIGH (ref 3.5–5.1)
Sodium: 134 mmol/L — ABNORMAL LOW (ref 135–145)
TOTAL PROTEIN: 4.7 g/dL — AB (ref 6.5–8.1)

## 2016-05-18 LAB — PROTIME-INR
INR: 1.23
Prothrombin Time: 15.6 seconds — ABNORMAL HIGH (ref 11.4–15.2)

## 2016-05-18 LAB — GLUCOSE, CAPILLARY: GLUCOSE-CAPILLARY: 68 mg/dL (ref 65–99)

## 2016-05-18 MED ORDER — DEXTROSE 50 % IV SOLN
INTRAVENOUS | Status: AC
  Administered 2016-05-18: 25 mL
  Filled 2016-05-18: qty 50

## 2016-05-18 MED ORDER — ONDANSETRON HCL 4 MG/2ML IJ SOLN
4.0000 mg | Freq: Once | INTRAMUSCULAR | Status: AC
  Administered 2016-05-18: 4 mg via INTRAVENOUS

## 2016-05-18 MED ORDER — ONDANSETRON HCL 4 MG/2ML IJ SOLN
INTRAMUSCULAR | Status: AC
  Administered 2016-05-18: 4 mg via INTRAVENOUS
  Filled 2016-05-18: qty 2

## 2016-05-18 MED ORDER — HEPARIN SODIUM (PORCINE) 5000 UNIT/ML IJ SOLN
5000.0000 [IU] | Freq: Three times a day (TID) | INTRAMUSCULAR | Status: DC
  Administered 2016-05-18 – 2016-05-19 (×3): 5000 [IU] via SUBCUTANEOUS
  Filled 2016-05-18 (×2): qty 1

## 2016-05-18 MED ORDER — FAMOTIDINE IN NACL 20-0.9 MG/50ML-% IV SOLN
20.0000 mg | INTRAVENOUS | Status: DC
  Administered 2016-05-18: 20 mg via INTRAVENOUS

## 2016-05-18 MED ORDER — SODIUM CHLORIDE 0.9 % IV BOLUS (SEPSIS)
1000.0000 mL | Freq: Once | INTRAVENOUS | Status: AC
  Administered 2016-05-18: 1000 mL via INTRAVENOUS

## 2016-05-18 MED ORDER — SODIUM CHLORIDE 0.9 % IV BOLUS (SEPSIS)
250.0000 mL | Freq: Once | INTRAVENOUS | Status: AC
  Administered 2016-05-18: 250 mL via INTRAVENOUS

## 2016-05-18 MED ORDER — HEPARIN SODIUM (PORCINE) 5000 UNIT/ML IJ SOLN
INTRAMUSCULAR | Status: AC
  Administered 2016-05-18: 5000 [IU] via SUBCUTANEOUS
  Filled 2016-05-18: qty 1

## 2016-05-18 MED ORDER — SODIUM CHLORIDE 0.9 % IV SOLN: 250.0000 mL | INTRAVENOUS | Status: DC | PRN

## 2016-05-18 MED ORDER — MORPHINE SULFATE (PF) 2 MG/ML IV SOLN
2.0000 mg | INTRAVENOUS | Status: DC | PRN
  Administered 2016-05-18 – 2016-05-19 (×2): 2 mg via INTRAVENOUS
  Filled 2016-05-18 (×2): qty 1

## 2016-05-18 MED ORDER — VANCOMYCIN HCL IN DEXTROSE 1-5 GM/200ML-% IV SOLN
1000.0000 mg | Freq: Once | INTRAVENOUS | Status: AC
  Administered 2016-05-18: 1000 mg via INTRAVENOUS
  Filled 2016-05-18: qty 200

## 2016-05-18 MED ORDER — IOPAMIDOL (ISOVUE-300) INJECTION 61%
30.0000 mL | Freq: Once | INTRAVENOUS | Status: AC
  Administered 2016-05-18: 30 mL via ORAL

## 2016-05-18 MED ORDER — FAMOTIDINE IN NACL 20-0.9 MG/50ML-% IV SOLN
INTRAVENOUS | Status: AC
  Administered 2016-05-18: 20 mg via INTRAVENOUS
  Filled 2016-05-18: qty 50

## 2016-05-18 MED ORDER — SODIUM CHLORIDE 0.9% FLUSH
3.0000 mL | Freq: Two times a day (BID) | INTRAVENOUS | Status: DC
  Administered 2016-05-18 – 2016-05-20 (×3): 3 mL via INTRAVENOUS

## 2016-05-18 MED ORDER — SODIUM CHLORIDE 0.9 % IV BOLUS (SEPSIS)
500.0000 mL | Freq: Once | INTRAVENOUS | Status: AC
  Administered 2016-05-18: 500 mL via INTRAVENOUS

## 2016-05-18 MED ORDER — FAMOTIDINE IN NACL 20-0.9 MG/50ML-% IV SOLN
20.0000 mg | Freq: Two times a day (BID) | INTRAVENOUS | Status: DC
Start: 2016-05-18 — End: 2016-05-18

## 2016-05-18 MED ORDER — ONDANSETRON HCL 4 MG/2ML IJ SOLN: 4.0000 mg | Freq: Four times a day (QID) | INTRAMUSCULAR | Status: DC | PRN

## 2016-05-18 MED ORDER — PIPERACILLIN-TAZOBACTAM 3.375 G IVPB 30 MIN
3.3750 g | Freq: Once | INTRAVENOUS | Status: AC
  Administered 2016-05-18: 3.375 g via INTRAVENOUS
  Filled 2016-05-18: qty 50

## 2016-05-18 MED ORDER — PHENYLEPHRINE HCL 10 MG/ML IJ SOLN
0.0000 ug/min | INTRAVENOUS | Status: DC
  Administered 2016-05-18: 50 ug/min via INTRAVENOUS
  Filled 2016-05-18 (×2): qty 1

## 2016-05-18 MED ORDER — ACETAMINOPHEN 325 MG PO TABS: 650.0000 mg | ORAL_TABLET | ORAL | Status: DC | PRN

## 2016-05-18 MED ORDER — SODIUM CHLORIDE 0.9 % IV BOLUS (SEPSIS)
1000.0000 mL | Freq: Once | INTRAVENOUS | Status: AC
Start: 2016-05-18 — End: 2016-05-18
  Administered 2016-05-18: 1000 mL via INTRAVENOUS

## 2016-05-18 MED ORDER — PHENYLEPHRINE HCL 10 MG/ML IJ SOLN
0.0000 ug/min | INTRAMUSCULAR | Status: DC
  Administered 2016-05-18: 200 ug/min via INTRAVENOUS
  Administered 2016-05-19: 50 ug/min via INTRAVENOUS
  Filled 2016-05-18 (×4): qty 2

## 2016-05-18 MED ORDER — SODIUM CHLORIDE 0.9% FLUSH
3.0000 mL | INTRAVENOUS | Status: DC | PRN
Start: 2016-05-18 — End: 2016-05-21

## 2016-05-18 NOTE — ED Notes (Signed)
Pt ok to go to CT without RN per dr Reita Cliche r/t [facility] orders; pt does not want pressors, CPR, intubation. Fluids running on transport.

## 2016-05-18 NOTE — ED Provider Notes (Signed)
[facility] Emergency Department Provider Note ____________________________________________   I have reviewed the triage vital signs and the triage nursing note.  HISTORY  Chief Complaint Abdominal Pain   Historian Patient  HPI [name] is a 70 y.o. female with endometrial cancer, history of diverticulitis, and colostomy, is presenting from nursing home due to decreased urine output, worsening generalized weakness, and abdominal pain. She was also found to be hypotensive with a blood pressure in the 70s. Reportedly her blood pressures are typically in the 90s. She is currently under treatment for endometrial cancer, but is on a break from chemotherapy because she was not tolerating it well. No reported fever. She states her mouth is very dry. She had decreased ostomy output, but she's also had decreased by mouth intake.  Symptoms of dehydration and dryness are moderate. Pain is very mild and diffuse in her abdomen.    Past Medical History:  Diagnosis Date  . Cancer [facility]) 2011   endometrial  . Chronic kidney disease    STONES  . Diverticulitis of colon (without mention of hemorrhage)   . History of kidney stones 2011  . Mechanical complication of colostomy and enterostomy   . Obesity, unspecified   . Personal history of tobacco use, presenting hazards to health     Patient Active Problem List   Diagnosis Date Noted  . Leukocytosis 05/11/2016  . Pyuria, sterile 05/11/2016  . Hypotension 05/11/2016  . Elevated transaminase level 05/11/2016  . Protein-calorie malnutrition ([place]) 05/11/2016  . Anemia 05/11/2016  . Acute on chronic renal failure ([place]) 05/11/2016  . DNR (do not resuscitate) discussion 05/10/2016  . Palliative care by specialist 05/10/2016  . Weakness generalized 05/10/2016  . Dyspnea   . Protein-calorie malnutrition, severe 05/09/2016  . Anorexia   . Failure to thrive in adult 05/08/2016  . Benign neoplasm of transverse colon    . Chronic diarrhea of unknown origin   . Hypocalcemia 04/26/2016  . Anasarca 04/25/2016  . Malnutrition ([place]) 04/25/2016  . Electrolyte disorder   . Thrombocytopenia ([place])   . Leukopenia   . ARF (acute renal failure) ([place]) 04/12/2016  . Bacteriuria with pyuria 03/28/2016  . Acute respiratory failure with hypoxia ([place]) 03/28/2016  . Atelectasis 03/28/2016  . Metabolic acidosis [id number]  . Diarrhea   . Dehydration   . Acute renal failure (ARF) ([place]) 03/18/2016  . Hypomagnesemia 03/07/2016  . Watery stools 01/23/2016  . Generalized abdominal pain 01/23/2016  . Endometrial cancer ([place]) 02/10/2015  . Paroxysmal supraventricular tachycardia ([place]) 05/24/2014  . History of endometrial cancer 04/09/2014  . Tubular adenomas removed from the transverse colon in June 2010. Hyperplastic polyps identified in the sigmoid colon. 03/02/2014  . History of colon polyps 03/02/2014  . Ventral hernia 09/29/2013  . 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 Complication of external stoma of gastrointestinal tract 02/19/2013    Past Surgical History:  Procedure Laterality Date  . ABDOMINAL HYSTERECTOMY  2011  . APPENDECTOMY    . CHOLECYSTECTOMY  05/07/14   Incidental during ventral hernia repair.   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 COLON SURGERY  05/20/1999   Drainage of pelvic abscess, end colostomy  . COLON SURGERY  11/15/2010   Revision of colostomy stoma  . COLONOSCOPY  June 2010,03/31/14   Tubular adenoma of the transverse colon, hyperplastic polyps of the sigmoid.  Marland Kitchen COLONOSCOPY WITH PROPOFOL N/A 04/27/2016   Procedure: COLONOSCOPY WITH  PROPOFOL;  Surgeon: [name], MD;  Location[facility] ENDOSCOPY;  Service: Endoscopy;  Laterality: N/A;  . COLOSTOMY  2011  . DILATION AND CURETTAGE OF UTERUS    . HERNIA REPAIR  04/06/14   ventral hernia, tetro rectus Ventralex ST mesh, 20 x 33 cm.   Marland Kitchen HYSTEROSCOPY   2011  . LITHOTRIPSY  2009  . MOLE REMOVAL  2003  . PORTACATH PLACEMENT N/A 03/01/2016   Procedure: INSERTION PORT-A-CATH;  Surgeon: [name], MD;  Location: [facility] ORS;  Service: General;  Laterality: N/A;  . PYELOLITHOTOMY    . skin cancer removal  02/2014  . TUBAL LIGATION      Prior to Admission medications   Medication Sig Start Date End Date Taking? Authorizing Provider  acetaminophen-codeine (TYLENOL #3) 300-30 MG tablet Take 1 tablet by mouth 3 (three) times daily. 05/11/16   Theodoro Grist, MD  acidophilus (RISAQUAD) CAPS capsule Take 1 capsule by mouth 2 (two) times daily. 05/01/16   Gladstone Lighter, MD  budesonide (ENTOCORT EC) 3 MG 24 hr capsule Take 3 capsules (9 mg total) by mouth every morning. 05/02/16   [name], MD  calcium carbonate (TUMS - DOSED IN MG ELEMENTAL CALCIUM) 500 MG chewable tablet Chew 2 tablets (400 mg of elemental calcium total) by mouth 3 (three) times daily with meals. 04/20/16   Fritzi Mandes, MD  dronabinol (MARINOL) 2.5 MG capsule Take 1 capsule (2.5 mg total) by mouth 2 (two) times daily before lunch and supper. 05/11/16   Theodoro Grist, MD  ENSURE (ENSURE) Take 1 Can by mouth.    Historical Provider, MD  midodrine (PROAMATINE) 10 MG tablet Take 1 tablet (10 mg total) by mouth 3 (three) times daily with meals. 05/11/16   Theodoro Grist, MD  omeprazole (PRILOSEC) 20 MG capsule Take 1 capsule (20 mg total) by mouth daily. 05/02/16   Lequita Asal, MD  ondansetron (ZOFRAN) 8 MG tablet Take 8 mg by mouth 2 (two) times daily as needed.  02/28/16   Historical Provider, MD  promethazine (PHENERGAN) 25 MG tablet Take 1 tablet (25 mg total) by mouth every 8 (eight) hours as needed for nausea or vomiting. Patient not taking: Reported on 05/08/2016 04/11/16   Lequita Asal, MD    No Known Allergies  Family History  Problem Relation Age of Onset  . Breast cancer Maternal Grandmother   . Colon cancer Mother   . Cancer Mother     Social History Social  History  Substance Use Topics  . Smoking status: Current Every Day Smoker    Packs/day: 0.25    Years: 15.00    Types: Cigarettes  . Smokeless tobacco: Never Used  . Alcohol use No    Review of Systems  Constitutional: Negative for fever. Eyes: Negative for visual changes. ENT: Dry mouth Cardiovascular: Negative for chest pain. Respiratory: Negative for shortness of breath. Gastrointestinal: Moderate diffuse abdominal discomfort, as per history of present illness. Genitourinary: Decreased urine output. Musculoskeletal: Negative for back pain. Skin: Negative for rash. Neurological: Negative for headache. 10 point Review of Systems otherwise negative ____________________________________________   PHYSICAL EXAM:  VITAL SIGNS: ED Triage Vitals  Enc Vitals Group     BP 05/20/2016 1144 (!) 77/42     Pulse Rate 05/05/2016 1144 85     Resp 05/09/2016 1144 (!) 22     Temp 06/01/2016 1138 97.7 F (36.5 C)     Temp Source 05/20/2016 1138 Oral     SpO2 05/15/2016 1144 98 %  Weight 05/30/2016 1142 150 lb (68 kg)     Height 05/04/2016 1142 5\' 2"  (1.575 m)     Head Circumference --      Peak Flow --      Pain Score 05/07/2016 1142 7     Pain Loc --      Pain Edu? --      Excl. in [place]? --      Constitutional: Alert and oriented. Looks like she doesn't have much energy, but in no acute distress. HEENT   Head: Normocephalic and atraumatic.      Eyes: Conjunctivae are normal. PERRL. Normal extraocular movements.      Ears:         Nose: No congestion/rhinnorhea.   Mouth/Throat: Mucous membranes are severely dry..   Neck: No stridor. Cardiovascular/Chest: Normal rate, regular rhythm.  No murmurs, rubs, or gallops. Respiratory: Normal respiratory effort without tachypnea nor retractions. Breath sounds are clear and equal bilaterally. No wheezes/rales/rhonchi. Gastrointestinal: Soft. No distention, no guarding, no rebound. Mild tenderness diffusely. Colostomy at left  abdomen. Genitourinary/rectal:Deferred Musculoskeletal: Nontender extremities. She does have diffuse edema arms and legs bilaterally. Neurologic:  Normal speech and language. No gross or focal neurologic deficits are appreciated. Skin:  Skin is warm, dry and intact. No rash noted. Psychiatric: Mood and affect are normal. Speech and behavior are normal. Patient exhibits appropriate insight and judgment.   ____________________________________________  LABS (pertinent positives/negatives)  Labs Reviewed  LACTIC ACID, PLASMA - Abnormal; Notable for the following:       Result Value   Lactic Acid, Venous 2.1 (*)    All other components within normal limits  COMPREHENSIVE METABOLIC PANEL - Abnormal; Notable for the following:    Sodium 134 (*)    Potassium 5.6 (*)    CO2 21 (*)    BUN 65 (*)    Creatinine, Ser 3.51 (*)    Calcium 7.8 (*)    Total Protein 4.7 (*)    Albumin 2.1 (*)    AST 60 (*)    Alkaline Phosphatase 801 (*)    Total Bilirubin 5.4 (*)    GFR calc non Af Amer 12 (*)    GFR calc Af Amer 14 (*)    All other components within normal limits  CBC WITH DIFFERENTIAL/PLATELET - Abnormal; Notable for the following:    WBC 14.2 (*)    RBC 3.52 (*)    Hemoglobin 10.7 (*)    HCT 32.7 (*)    RDW 18.6 (*)    Platelets 102 (*)    Neutro Abs 10.2 (*)    Monocytes Absolute 1.1 (*)    All other components within normal limits  PROTIME-INR - Abnormal; Notable for the following:    Prothrombin Time 15.6 (*)    All other components within normal limits  BLOOD GAS, VENOUS - Abnormal; Notable for the following:    pCO2, Ven 34 (*)    pO2, Ven <31.0 (*)    Bicarbonate 18.8 (*)    Acid-base deficit 6.1 (*)    All other components within normal limits  URINALYSIS COMPLETEWITH MICROSCOPIC ([facility] ONLY) - Abnormal; Notable for the following:    Color, Urine AMBER (*)    APPearance CLOUDY (*)    Bilirubin Urine 1+ (*)    Hgb urine dipstick 3+ (*)    Protein, ur 100 (*)     Leukocytes, UA 1+ (*)    Bacteria, UA RARE (*)    Squamous Epithelial / LPF 0-5 (*)  All other components within normal limits  TROPONIN I - Abnormal; Notable for the following:    Troponin I 0.09 (*)    All other components within normal limits  CULTURE, BLOOD (ROUTINE X 2)  URINE CULTURE  LACTIC ACID, PLASMA    ____________________________________________    EKG I, Lisa Roca, MD, the attending physician have personally viewed and interpreted all ECGs.  80 bpm. Normal sinus rhythm. Narrow dressed with normal axis. Nonspecific ST and T-wave ____________________________________________  RADIOLOGY All Xrays were viewed by me. Imaging interpreted by Radiologist.  CT abdomen and pelvis without contrast:  IMPRESSION: Dilated small bowel loops. Distal small bowel loops are decompressed. Findings compatible with small bowel obstruction. Exact transition point not visualized.  Severe diffuse fatty infiltration of the liver.  Numerous hyper row and hypodense cysts within the kidneys, stable.  Small right pleural effusion.  Right lower lobe atelectasis. __________________________________________  PROCEDURES  Procedure(s) performed: None  Critical Care performed: CRITICAL CARE Performed by: Lisa Roca   Total critical care time: 90 minutes  Critical care time was exclusive of separately billable procedures and treating other patients.  Critical care was necessary to treat or prevent imminent or life-threatening deterioration.  Critical care was time spent personally by me on the following activities: development of treatment plan with patient and/or surrogate as well as nursing, discussions with consultants, evaluation of patient's response to treatment, examination of patient, obtaining history from patient or surrogate, ordering and performing treatments and interventions, ordering and review of laboratory studies, ordering and review of radiographic studies,  pulse oximetry and re-evaluation of patient's condition.   ____________________________________________   ED COURSE / ASSESSMENT AND PLAN  Pertinent labs & imaging results that were available during my care of the patient were reviewed by me and considered in my medical decision making (see chart for details).   Ms. Charissa Bash was sent in from nursing home with decreased urine output, generalized weakness, and on appearance is extremely dehydrated. I most suspicious that her hypotension is probably due to dehydration, but consideration for sepsis is also possible.  Her laboratory studies do show acute renal failure, she is being hydrated with 2 L normal saline. Uncertain etiology if this is related to sepsis. Patient was covered with think vancomycin and Zosyn for healthcare acquired sepsis of unknown source.  Her x-ray shows no pneumonia, but her abdominal x-ray shows possibility for ileus versus small bowel obstruction, I will obtain CT imaging to further evaluate.  After 2 L normal saline, blood pressure had come up to 90 systolic but then down to 70 systolic again. Her white blood count is elevated and her urinalysis consistent with urinary tract infection. I had already started her on antibiotics, but at this point I'm pretty convinced that her hypotension is due to sepsis rather than simple dehydration. She did come with a "most " form that indicated she wanted limited interventions, however when I discussed with the patient that she was having evidence of septic shock and that I was recommending potentially additional interventions that might include central line and or blood pressure medication, she seemed a little confused but stated that she wanted interventions. Additionally her CT scan came back showing a small bowel obstruction. I'll think she is decent surgical candidate, but I recommended NG tube.  Because she does have some confusion, I spoke with her husband on the phone and  indicated to him her forearm how it was signed, and that I was recommending. He stated that he was in  agreement with what she is saying now, that she is agreeable for a NG tube, the need for a central line if that presents itself, but that they would want to maintain DO NOT INTUBATE and no CPR.  I spoke with the intensivist,Dr. Kasa for admission. He is recommending at this point continued blood pressure support with IV fluids, to a total of 5 or 6 L before considering blood pressure medication. He will come see the patient.  I spoke with Dr. Burt Knack, surgery due to the findings of small bowel obstruction. He will come see the patient.    CONSULTATIONS:  Lobbyist.   Patient / Family / Caregiver informed of clinical course, medical decision-making process, and agree with plan.     ___________________________________________   FINAL CLINICAL IMPRESSION(S) / ED DIAGNOSES   Final diagnoses:  Acute renal failure, unspecified acute renal failure type ([place])  Sepsis, unspecified organism ([place])  Dehydration  UTI (lower urinary tract infection)  Small bowel obstruction ([place])              Note: This dictation was prepared with Dragon dictation. Any transcriptional errors that result from this process are unintentional    Lisa Roca, MD 05/07/2016 1551

## 2016-05-18 NOTE — ED Notes (Signed)
Called code sepsis to [place], Nunzio Cobbs, at [employer]

## 2016-05-18 NOTE — Consult Note (Signed)
Patient ID: [name], female   DOB: Jun 19, 1946, 70 y.o.   MRN: [id number]  HPI [name] is a 70 y.o. female sent in consultation for possible bowel obstruction. Recently she is 70 year old with history of endometrial cancer that has metastases and currently was unable to tolerate chemotherapy secondary to her failure to thrive. She has a history of morbid obesity, chronic kidney disease and recurrent episode of dehydration and hypotension. She was recently admitted less than 2 weeks ago for dehydration. That time one of my partner saw her and there was some component of ileus. No surgical intervention was recommended or required. She is DO NOT RESUSCITATE DO NOT INTUBATE. When she came into the emergency room apparently she was complaining of some abdominal pain, there was and decrease in urine output and also hypotension. She was resuscitated with crystalloids and part of the workup included a CT scan that personal review. I have personally reviewed and compared to CT scans for the last 3 months and was tracked medially phimosis the progression of the fatty liver infiltration not to severe form. Now there is some diffuse dilated loops of small bowel without definitive transition point. There is no evidence of ischemic bowel or internal hernia. She's had multiple abdominal operations including Hartman's procedure,  hysterectomy and cholecystectomy. Majority of the information is gathered from different physicians node as well as nursing report. Currently the patient is disoriented and unable to give me a full history. There is no family available at this time  HPI  Past Medical History:  Diagnosis Date  . Cancer [facility]) 2011   endometrial  . Chronic kidney disease    STONES  . Diverticulitis of colon (without mention of hemorrhage)   . History of kidney stones 2011  . Mechanical complication of colostomy and enterostomy   . Obesity, unspecified   . Personal history of tobacco use,  presenting hazards to health     Past Surgical History:  Procedure Laterality Date  . ABDOMINAL HYSTERECTOMY  2011  . APPENDECTOMY    . CHOLECYSTECTOMY  05/07/14   Incidental during ventral hernia repair.   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 COLON SURGERY  05/20/1999   Drainage of pelvic abscess, end colostomy  . COLON SURGERY  11/15/2010   Revision of colostomy stoma  . COLONOSCOPY  June 2010,03/31/14   Tubular adenoma of the transverse colon, hyperplastic polyps of the sigmoid.  Marland Kitchen COLONOSCOPY WITH PROPOFOL N/A 04/27/2016   Procedure: COLONOSCOPY WITH PROPOFOL;  Surgeon: [name], MD;  Location: [facility] ENDOSCOPY;  Service: Endoscopy;  Laterality: N/A;  . COLOSTOMY  2011  . DILATION AND CURETTAGE OF UTERUS    . HERNIA REPAIR  04/06/14   ventral hernia, tetro rectus Ventralex ST mesh, 20 x 33 cm.   Marland Kitchen HYSTEROSCOPY  2011  . LITHOTRIPSY  2009  . MOLE REMOVAL  2003  . PORTACATH PLACEMENT N/A 03/01/2016   Procedure: INSERTION PORT-A-CATH;  Surgeon: [name], MD;  Location: [facility] ORS;  Service: General;  Laterality: N/A;  . PYELOLITHOTOMY    . skin cancer removal  02/2014  . TUBAL LIGATION      Family History  Problem Relation Age of Onset  . Breast cancer Maternal Grandmother   . Colon cancer Mother   . Cancer Mother     Social History Social History  Substance Use Topics  . Smoking status: Current Every Day Smoker  Packs/day: 0.25    Years: 15.00    Types: Cigarettes  . Smokeless tobacco: Never Used  . Alcohol use No    No Known Allergies  Current Facility-Administered Medications  Medication Dose Route Frequency Provider Last Rate Last Dose  . 0.9 %  sodium chloride infusion  250 mL Intravenous PRN Flora Lipps, MD      . 0.9 %  sodium chloride infusion  250 mL Intravenous PRN Flora Lipps, MD      . acetaminophen (TYLENOL) tablet 650 mg  650 mg Oral Q4H PRN Flora Lipps, MD       . famotidine (PEPCID) IVPB 20 mg premix  20 mg Intravenous Q24H Flora Lipps, MD 100 mL/hr at 06/01/2016 1756 20 mg at 05/31/2016 1756  . heparin injection 5,000 Units  5,000 Units Subcutaneous Q8H Flora Lipps, MD   5,000 Units at 05/10/2016 1755  . ondansetron (ZOFRAN) injection 4 mg  4 mg Intravenous Q6H PRN Flora Lipps, MD      . phenylephrine (NEO-SYNEPHRINE) 10 mg in dextrose 5 % 250 mL (0.04 mg/mL) infusion  0-400 mcg/min Intravenous Titrated Javier Glazier, MD      . sodium chloride flush (NS) 0.9 % injection 3 mL  3 mL Intravenous Q12H Flora Lipps, MD      . sodium chloride flush (NS) 0.9 % injection 3 mL  3 mL Intravenous PRN Flora Lipps, MD       Facility-Administered Medications Ordered in Other Encounters  Medication Dose Route Frequency Provider Last Rate Last Dose  . magnesium sulfate IVPB 2 g 50 mL  2 g Intravenous Once Lequita Asal, MD         Review of Systems A full ROS was attempted but was unable to be obtained secondary to her altered mental status  Physical Exam Blood pressure (!) 71/35, pulse 79, temperature 97.5 F (36.4 C), temperature source Oral, resp. rate 17, height 5\' 2"  (1.575 m), weight 75.8 kg (167 lb 1.7 oz), SpO2 97 %. CONSTITUTIONAL: Debilitated elderly and obese female EYES: Pupils are equal, round, and reactive to light, Sclera are non-icteric. EARS, NOSE, MOUTH AND THROAT: The oropharynx is clear. The oral mucosa is dry LYMPH NODES:  Lymph nodes in the neck are normal. RESPIRATORY:  Lungs are clear. She is tachypnea and there is some use of accessory muscles CARDIOVASCULAR: Heart is regular without murmurs, gallops, or rubs. GI: The abdomen is soft, mildly distended. There is midline laparotomy scar. There is also a colostomy with some gas and a small a lot of stool. There is no peritonitis GU: Rectal deferred.   MUSCULOSKELETAL: Normal muscle strength and tone. No cyanosis or edema.   SKIN: Turgor is good and there are no pathologic skin lesions  or ulcers. NEUROLOGIC:  Cranial nerves are grossly intact. She is disoriented no focal deficits PSYCH: DisOriented to person, place and time. She keeps stating "" hurry up " " everything hurts"   Data Reviewed  I have personally reviewed the patient's imaging, laboratory findings and medical records.    Assessment Plan Elderly female with a history of metastatic endometrial cancer and failure to thrive with multiple comorbidities including chronic kidney insufficiency now with progression of her liver disease as evidenced on the imaging is status of her CAT scan as well as a bilirubin now of 5.4 with an increase of the alkaline phosphatase up to 800. Patient is malnourished with albumin of 2.1 and a creatinine of 3.5. Her platelets at 100,000.  I do  think that she is more EN of her life and she does have a component of ileus but I do not believe that this is causing her decompensation. She is DO NOT RESUSCITATE and DO NOT INTUBATE.  I recommend reconvening with the family about definitive wishes regarding major interventions. She is obviously not a surgical candidate given that her mortality risk would be prohibitive. Recommend and management with NG tube, nothing by mouth and aggressive fluid resuscitation. We will continue to follow.   Caroleen Hamman, MD FACS General Surgeon 06/01/2016, 8:38 PM

## 2016-05-18 NOTE — ED Triage Notes (Signed)
[facility]. Pt has had abdominal pain, urinary retention, and constipation for 3 days. Has ostomy

## 2016-05-18 NOTE — ED Notes (Signed)
Dr lord notified of troponin  

## 2016-05-18 NOTE — Progress Notes (Signed)
[facility] Progress Note Patient Name: [name] DOB: Mar 26, 1946 MRN: [id number]   Date of Service  05/27/2016  HPI/Events of Note  Notified by nurse of persistent hypotension. Patient appears to have had in excess of 5 L of IV fluid. Currently saturating 96% on room air. Patient's advance directives reviewed by nurse at bedside and there is no mention of desires was vasopressor infusion. Reviewed enteric tube positioning with evidence of tube coursing below diaphragm.   eICU Interventions  1. Okay to look up enteric feeding tube to low intermittent suction 2. Given normal saline 1 L IV fluid bolus 3. Initiating Neo-Synephrine via implanted port to maintain mean arterial pressure and systolic blood pressure      Intervention Category Major Interventions: Shock - evaluation and management  Tera Partridge 05/05/2016, 7:57 PM

## 2016-05-18 NOTE — H&P (Signed)
PULMONARY / CRITICAL CARE MEDICINE   Name: [name] MRN: [id number] DOB: [birth year]    ADMISSION DATE:  06/02/2016 CHIEF COMPLAINT:  abd pain      HISTORY OF PRESENT ILLNESS:   70 y.o. female with endometrial cancer, history of diverticulitis, and colostomy, is presenting from nursing home due to decreased urine output, - worsening generalized weakness, and abdominal pain. - She was also found to be hypotensive with a blood pressure in the 70s. - Reportedly her blood pressures are typically in the 90s.  -She is currently under treatment for endometrial cancer, but is on a break from chemotherapy because she was not tolerating it well.  -No reported fever. She states her mouth is very dry. She had decreased ostomy output, but she's also had decreased by mouth intake. ER course -CT abd reports SBO, NG placed, IVF's given Possible need for vasopressors -patient is DNR/DNI   PAST MEDICAL HISTORY :   has a past medical history of Cancer ([place]) (2011); Chronic kidney disease; Diverticulitis of colon (without mention of hemorrhage); History of kidney stones (2011); Mechanical complication of colostomy and enterostomy; Obesity, unspecified; and Personal history of tobacco use, presenting hazards to health.  has a past surgical history that includes Appendectomy; Lithotripsy (2009); Tubal ligation; Pyelolithotomy; Colostomy (2011); Abdominal hysterectomy (2011); Dilation and curettage of uterus; Mole removal (2003); Hysteroscopy (2011); Colonoscopy (June 2010,03/31/14); Colon surgery (05/10/2010); Colon surgery (05/20/1999); Colon surgery (11/15/2010); skin cancer removal (02/2014); Hernia repair (04/06/14); Cholecystectomy (05/07/14); Portacath placement (N/A, 03/01/2016); and Colonoscopy with propofol (N/A, 04/27/2016). Prior to Admission medications   Medication Sig Start Date End Date Taking? Authorizing Provider  acetaminophen-codeine (TYLENOL #3) 300-30 MG tablet Take 1 tablet by mouth 3  (three) times daily. 05/11/16  Yes Theodoro Grist, MD  acidophilus (RISAQUAD) CAPS capsule Take 1 capsule by mouth 2 (two) times daily. 05/01/16  Yes Gladstone Lighter, MD  budesonide (ENTOCORT EC) 3 MG 24 hr capsule Take 3 capsules (9 mg total) by mouth every morning. 05/02/16  Yes [name], MD  calcium carbonate (TUMS - DOSED IN MG ELEMENTAL CALCIUM) 500 MG chewable tablet Chew 2 tablets (400 mg of elemental calcium total) by mouth 3 (three) times daily with meals. 04/20/16  Yes Fritzi Mandes, MD  dronabinol (MARINOL) 2.5 MG capsule Take 1 capsule (2.5 mg total) by mouth 2 (two) times daily before lunch and supper. 05/11/16  Yes Theodoro Grist, MD  magnesium oxide (MAG-OX) 400 MG tablet Take 400 mg by mouth daily.   Yes Historical Provider, MD  midodrine (PROAMATINE) 10 MG tablet Take 1 tablet (10 mg total) by mouth 3 (three) times daily with meals. 05/11/16  Yes Theodoro Grist, MD  omeprazole (PRILOSEC) 20 MG capsule Take 1 capsule (20 mg total) by mouth daily. 05/02/16  Yes Lequita Asal, MD  ondansetron (ZOFRAN) 8 MG tablet Take 8 mg by mouth 2 (two) times daily as needed.  02/28/16  Yes Historical Provider, MD  promethazine (PHENERGAN) 25 MG tablet Take 1 tablet (25 mg total) by mouth every 8 (eight) hours as needed for nausea or vomiting. 04/11/16  Yes Lequita Asal, MD  sertraline (ZOLOFT) 50 MG tablet Take 50 mg by mouth daily.   Yes Historical Provider, MD   No Known Allergies  FAMILY HISTORY:  indicated that the status of her mother is unknown. She indicated that the status of her maternal grandmother is unknown.   SOCIAL HISTORY:  reports that she has been smoking Cigarettes.  She has a 3.75 pack-year  smoking history. She has never used smokeless tobacco. She reports that she does not drink alcohol or use drugs.  REVIEW OF SYSTEMS:  Limited due to critical illness   SUBJECTIVE:   VITAL SIGNS: Temp:  [97.7 F (36.5 C)] 97.7 F (36.5 C) (09/15 1138) Pulse Rate:  [76-87] 80 (09/15  1515) Resp:  [16-25] 18 (09/15 1515) BP: (74-105)/(42-81) 79/42 (09/15 1515) SpO2:  [74 %-98 %] 94 % (09/15 1515) Weight:  [150 lb (68 kg)] 150 lb (68 kg) (09/15 1142) HEMODYNAMICS:   Intake/Output Summary (Last 24 hours) at 05/13/2016 1610 Last data filed at 05/24/2016 1501  Gross per 24 hour  Intake             2000 ml  Output                0 ml  Net             2000 ml      Physical Examination:   GENERAL:critically ill appearing, +resp distress HEAD: Normocephalic, atraumatic.  EYES: Pupils equal, round, reactive to light.  No scleral icterus.  MOUTH: dry mucosal membrane. NECK: Supple. No thyromegaly. No nodules. No JVD. c collar in place PULMONARY: Diffuse coarse rhonchi +rhonchi CARDIOVASCULAR: S1 and S2. Regular rate and rhythm. No murmurs, rubs, or gallops.  GASTROINTESTINAL:+tender, +distended. No masses.neg bowel sounds. No hepatosplenomegaly.  MUSCULOSKELETAL: No swelling, clubbing, or edema.  NEUROLOGIC: no focal deficits SKIN:intact,warm,dry    Recent Labs Lab 05/11/16 1754 05/07/2016 1158  WBC  --  14.2*  HGB 10.0* 10.7*  HCT 29.2* 32.7*  PLT  --  102*   Coag's  Recent Labs Lab 06/01/2016 1158  INR 1.23   BMET  Recent Labs Lab 05/05/2016 1158  NA 134*  K 5.6*  CL 101  CO2 21*  BUN 65*  CREATININE 3.51*  GLUCOSE 67   Electrolytes  Recent Labs Lab 05/27/2016 1158  CALCIUM 7.8*   Sepsis Markers  Recent Labs Lab 05/07/2016 1158 05/07/2016 1516  LATICACIDVEN 2.1* 1.6   ABG No results for input(s): PHART, PCO2ART, PO2ART in the last 168 hours. Liver Enzymes  Recent Labs Lab 05/20/2016 1158  AST 60*  ALT 48  ALKPHOS 801*  BILITOT 5.4*  ALBUMIN 2.1*   Cardiac Enzymes  Recent Labs Lab 05/25/2016 1158  TROPONINI 0.09*   Glucose No results for input(s): GLUCAP in the last 168 hours.  Imaging Ct Abdomen Pelvis Wo Contrast  Result Date: 05/29/2016 CLINICAL DATA:  Abdominal pain, urinary retention, constipation for 3 days. EXAM: CT  ABDOMEN AND PELVIS WITHOUT CONTRAST TECHNIQUE: Multidetector CT imaging of the abdomen and pelvis was performed following the standard protocol without IV contrast. COMPARISON:  CT 05/09/2016.  Plain films 05/07/2016. FINDINGS: Lower chest: Small right pleural effusion with right base atelectasis. Left base is clear. Heart is normal size. Hepatobiliary: Severe diffuse fatty infiltration of the liver. No focal abnormality seen. Prior cholecystectomy. Pancreas: No focal abnormality or ductal dilatation. Spleen: No focal abnormality.  Normal size. Adrenals/Urinary Tract: Numerous bilateral low-density and high-density cysts throughout both kidneys, stable since prior study. No hydronephrosis. Adrenal glands are unremarkable. Urinary bladder decompressed. Stomach/Bowel: Left lower quadrant ostomy. Colon is decompressed. Dilated small bowel loops in the abdomen and pelvis, similar to prior CT compatible with small bowel obstruction. Distal small bowel loops decompressed. Exact transition point not visualized. Vascular/Lymphatic: Diffuse aortic and iliac calcifications. No aneurysm. No adenopathy. Reproductive: Prior hysterectomy.  No adnexal mass. Other: No free fluid or free air. Musculoskeletal: No  acute bony abnormality. IMPRESSION: Dilated small bowel loops. Distal small bowel loops are decompressed. Findings compatible with small bowel obstruction. Exact transition point not visualized. Severe diffuse fatty infiltration of the liver. Numerous hyper row and hypodense cysts within the kidneys, stable. Small right pleural effusion.  Right lower lobe atelectasis. Electronically Signed   By: Rolm Baptise M.D.   On: 06/01/2016 14:47   Dg Abd Acute W/chest  Result Date: 06/01/2016 CLINICAL DATA:  Abdominal pain, nausea and vomiting for 3 days EXAM: DG ABDOMEN ACUTE W/ 1V CHEST COMPARISON:  05/08/16 FINDINGS: Cardiomediastinal silhouette is stable. There is elevation of the left hemidiaphragm. Right subclavian Port-A-Cath  is unchanged in position. No infiltrate or pulmonary edema. There are distended small bowel loops in the abdomen suspicious for ileus partial obstruction or enteritis. Moderate stool and gas noted in right colon and rectosigmoid colon. No evidence of free abdominal air IMPRESSION: No acute disease within chest. Gaseous distended small bowel loops in left abdomen suspicious for ileus, partial bowel obstruction or enteritis. Moderate stool and gas in right colon and rectosigmoid colon. No free abdominal air. Electronically Signed   By: Lahoma Crocker M.D.   On: 05/28/2016 13:02     ASSESSMENT / PLAN:  70 yo white female with endometrial cancer s/p colostomy for diverticulitis with acute abd pain with acute renal failure and hypovolumic shock with SBO  CARDIOVASCULAR Will give more IVF's before starting Vasopressors -will not place CVL at this time -obtain PIV Baseline BP is SBP 90   RENAL Renal Failure-most likely due to ATN -follow chem 7 -follow UO -continue Foley Catheter-assess need  GASTROINTESTINAL SBO with endometrial cancer -continue NG suction, keep NPO   HEMATOLOGIC Follow CBC   FAMILY  Unavailable at this time    I have personally obtained a history, examined the patient, evaluated Pertinent laboratory and [employer]/imaging results, and  formulated the assessment and plan   The Patient requires high complexity decision making for assessment and support, frequent evaluation and titration of therapies, application of advanced monitoring technologies and extensive interpretation of multiple databases. Critical Care Time devoted to patient care services described in this note is 45  minutes.   Overall, patient is critically ill, prognosis is guarded.   high risk for cardiac arrest and death.    Corrin Parker, M.D.  [facility]  Medical Director [place] Director [facility] Cardio-Pulmonary Department

## 2016-05-18 NOTE — ED Notes (Signed)
Patient transported to X-ray 

## 2016-05-18 NOTE — Progress Notes (Signed)
Patient found to have hypotension with BP 74/47. Elink RN made aware. Awaiting orders at this time. Will continue to monitor.

## 2016-05-18 NOTE — ED Notes (Signed)
Patient transported to CT 

## 2016-05-18 NOTE — ED Notes (Signed)
Ok only one set blood cx per dr Reita Cliche. Pt has port, dehydrated and difficult stick.

## 2016-05-18 NOTE — ED Notes (Signed)
Dr Reita Cliche notified lactate 2.1

## 2016-05-18 NOTE — Progress Notes (Signed)
Per Dr. Ashok Cordia, okay to use NG tube at this time. Orders for NS bolus and Neo-Synephrine drip. Will implement and continue to monitor

## 2016-05-19 ENCOUNTER — Inpatient Hospital Stay: Payer: Medicare Other

## 2016-05-19 DIAGNOSIS — A419 Sepsis, unspecified organism: Secondary | ICD-10-CM

## 2016-05-19 LAB — BASIC METABOLIC PANEL
Anion gap: 9 (ref 5–15)
BUN: 51 mg/dL — ABNORMAL HIGH (ref 6–20)
CO2: 17 mmol/L — ABNORMAL LOW (ref 22–32)
Calcium: 6.7 mg/dL — ABNORMAL LOW (ref 8.9–10.3)
Chloride: 110 mmol/L (ref 101–111)
Creatinine, Ser: 2.83 mg/dL — ABNORMAL HIGH (ref 0.44–1.00)
GFR calc Af Amer: 18 mL/min — ABNORMAL LOW (ref >60)
GFR calc non Af Amer: 16 mL/min — ABNORMAL LOW (ref >60)
Glucose, Bld: 161 mg/dL — ABNORMAL HIGH (ref 65–99)
Potassium: 4.9 mmol/L (ref 3.5–5.1)
Sodium: 136 mmol/L (ref 135–145)

## 2016-05-19 LAB — CBC
HCT: 35.4 % (ref 35.0–47.0)
HEMOGLOBIN: 11.9 g/dL — AB (ref 12.0–16.0)
MCH: 31.6 pg (ref 26.0–34.0)
MCHC: 33.7 g/dL (ref 32.0–36.0)
MCV: 93.8 fL (ref 80.0–100.0)
PLATELETS: 115 10*3/uL — AB (ref 150–440)
RBC: 3.77 MIL/uL — AB (ref 3.80–5.20)
RDW: 19.4 % — ABNORMAL HIGH (ref 11.5–14.5)
WBC: 15 10*3/uL — ABNORMAL HIGH (ref 3.6–11.0)

## 2016-05-19 LAB — MAGNESIUM: MAGNESIUM: 1.8 mg/dL (ref 1.7–2.4)

## 2016-05-19 LAB — URINE CULTURE: Culture: <10000 — AB

## 2016-05-19 LAB — TROPONIN I: Troponin I: 0.05 ng/mL (ref <0.03)

## 2016-05-19 LAB — PHOSPHORUS: PHOSPHORUS: 2.5 mg/dL (ref 2.5–4.6)

## 2016-05-19 MED ORDER — GLYCOPYRROLATE 0.2 MG/ML IJ SOLN
0.2000 mg | INTRAMUSCULAR | Status: DC | PRN
  Administered 2016-05-19: 0.2 mg via INTRAVENOUS
  Filled 2016-05-19 (×2): qty 1

## 2016-05-19 MED ORDER — MORPHINE SULFATE (PF) 2 MG/ML IV SOLN
2.0000 mg | INTRAVENOUS | Status: DC | PRN
  Administered 2016-05-19 (×3): 2 mg via INTRAVENOUS
  Filled 2016-05-19 (×2): qty 1

## 2016-05-19 MED ORDER — NOREPINEPHRINE BITARTRATE 1 MG/ML IV SOLN: 2.0000 ug/min | INTRAVENOUS | Status: DC

## 2016-05-19 MED ORDER — MORPHINE SULFATE (PF) 2 MG/ML IV SOLN
2.0000 mg | INTRAVENOUS | Status: DC | PRN
  Administered 2016-05-20 (×4): 2 mg via INTRAVENOUS
  Filled 2016-05-19 (×5): qty 1

## 2016-05-19 MED ORDER — MORPHINE SULFATE (PF) 2 MG/ML IV SOLN
2.0000 mg | INTRAVENOUS | Status: DC | PRN
Start: 2016-05-19 — End: 2016-05-19

## 2016-05-19 MED ORDER — NOREPINEPHRINE 4 MG/250ML-% IV SOLN
0.0000 ug/min | INTRAVENOUS | Status: DC
  Administered 2016-05-19: 5 ug/min via INTRAVENOUS
  Administered 2016-05-19: 20 ug/min via INTRAVENOUS
  Filled 2016-05-19 (×2): qty 250

## 2016-05-19 MED ORDER — MORPHINE SULFATE (PF) 2 MG/ML IV SOLN
INTRAVENOUS | Status: AC
  Filled 2016-05-19: qty 1

## 2016-05-19 MED ORDER — NOREPINEPHRINE 4 MG/250ML-% IV SOLN
INTRAVENOUS | Status: AC
  Administered 2016-05-19
  Filled 2016-05-19: qty 250

## 2016-05-19 NOTE — Progress Notes (Signed)
Dr Mortimer Fries in to speak with family, plan to make comfort care when daughter arrives

## 2016-05-19 NOTE — Discharge Summary (Signed)
PULMONARY / CRITICAL CARE MEDICINE   Name: [name] MRN: [id number] DOB: 1945/09/16    ADMISSION DATE:  05/06/2016 CHIEF COMPLAINT:  abd pain  Admission diagnosis hypovolumic shock Small Bowel Obstruction Severe protein calorie malnutrition  Discharge daignosis Small Bowel Obstrcutuion Endometrial cancer Severe protein calorie malnutrition     DEATH SUMMARY   70 yo white female with endometrial cancer s/p colostomy for diverticulitis with acute abd pain with acute renal failure and hypovolumic shock with SBO  Patient was given IVF's and started on vasopressors Patient decompensating despite aggressive therapy Family has agred to make her comfortable, morphine as needed   After further discussion with family, patient with very poor prognosis, patient has suffered enough according to family They have decided(husband and 2 daughters) that she needs to be comfortable.  They have agreed and consented to comfort care measures.    Patient expired at 0543 this morning verified by 2 RN. Family notified, MD notified, and Nursing supervisor notified. On June 08, 2016   [name], M.D.  [name]  Medical Director [place] Director [facility] Cardio-Pulmonary Department

## 2016-05-19 NOTE — Progress Notes (Signed)
CC sepsis Subjective: This a patient with sepsis likely due to a UTI. Patient is experiencing acute deterioration. I discussed her care with Dr. Dahlia Byes and with nursing. She is a DNR/DNI  Objective: Vital signs in last 24 hours: Temp:  [96.3 F (35.7 C)-97.8 F (36.6 C)] 97.8 F (36.6 C) (09/16 0700) Pulse Rate:  [48-112] 79 (09/16 0800) Resp:  [11-25] 13 (09/16 0800) BP: (54-169)/(26-130) 85/61 (09/16 0800) SpO2:  [74 %-100 %] 95 % (09/16 0800) Weight:  [150 lb (68 kg)-175 lb 11.3 oz (79.7 kg)] 175 lb 11.3 oz (79.7 kg) (09/16 0500)    Intake/Output from previous day: 09/15 0701 - 09/16 0700 In: 3500 [I.V.:2000; IV Piggyback:1500] Out: -  Intake/Output this shift: No intake/output data recorded.  Physical exam:  Patient attempts to open eyes to voice. Not speak. Vital signs reviewed Abdomen is soft nontender no grimace to palpation. It is distended however somewhat tympanitic  Lab Results: CBC   Recent Labs  05/17/2016 1158 05/19/16 0455  WBC 14.2* 15.0*  HGB 10.7* 11.9*  HCT 32.7* 35.4  PLT 102* 115*   BMET  Recent Labs  05/13/2016 1158 05/19/16 0455  NA 134* 136  K 5.6* 4.9  CL 101 110  CO2 21* 17*  GLUCOSE 67 161*  BUN 65* 51*  CREATININE 3.51* 2.83*  CALCIUM 7.8* 6.7*   PT/INR  Recent Labs  05/19/2016 1158  LABPROT 15.6*  INR 1.23   ABG  Recent Labs  05/14/2016 1259  HCO3 18.8*    Studies/Results: Ct Abdomen Pelvis Wo Contrast  Result Date: 05/28/2016 CLINICAL DATA:  Abdominal pain, urinary retention, constipation for 3 days. EXAM: CT ABDOMEN AND PELVIS WITHOUT CONTRAST TECHNIQUE: Multidetector CT imaging of the abdomen and pelvis was performed following the standard protocol without IV contrast. COMPARISON:  CT 05/09/2016.  Plain films 05/26/2016. FINDINGS: Lower chest: Small right pleural effusion with right base atelectasis. Left base is clear. Heart is normal size. Hepatobiliary: Severe diffuse fatty infiltration of the liver. No focal  abnormality seen. Prior cholecystectomy. Pancreas: No focal abnormality or ductal dilatation. Spleen: No focal abnormality.  Normal size. Adrenals/Urinary Tract: Numerous bilateral low-density and high-density cysts throughout both kidneys, stable since prior study. No hydronephrosis. Adrenal glands are unremarkable. Urinary bladder decompressed. Stomach/Bowel: Left lower quadrant ostomy. Colon is decompressed. Dilated small bowel loops in the abdomen and pelvis, similar to prior CT compatible with small bowel obstruction. Distal small bowel loops decompressed. Exact transition point not visualized. Vascular/Lymphatic: Diffuse aortic and iliac calcifications. No aneurysm. No adenopathy. Reproductive: Prior hysterectomy.  No adnexal mass. Other: No free fluid or free air. Musculoskeletal: No acute bony abnormality. IMPRESSION: Dilated small bowel loops. Distal small bowel loops are decompressed. Findings compatible with small bowel obstruction. Exact transition point not visualized. Severe diffuse fatty infiltration of the liver. Numerous hyper row and hypodense cysts within the kidneys, stable. Small right pleural effusion.  Right lower lobe atelectasis. Electronically Signed   By: Rolm Baptise M.D.   On: 06/02/2016 14:47   Dg Chest Port 1 View  Result Date: 05/19/2016 CLINICAL DATA:  INTERVAL CHANGES EXAM: PORTABLE CHEST 1 VIEW FINDINGS: Central venous line and NG tube unchanged. Normal cardiac silhouette. LEFT basilar atelectasis and effusion. No infiltrate or pneumothorax. IMPRESSION: 1. Mild increase in LEFT effusion/atelectasis. 2. Stable support apparatus. Electronically Signed   By: Suzy Bouchard M.D.   On: 05/19/2016 07:14   Dg Chest Port 1 View  Result Date: 05/13/2016 CLINICAL DATA:  NG tube placement. EXAM: PORTABLE CHEST 1  VIEW COMPARISON:  05/22/2016 at 1234 hours FINDINGS: Right subclavian Port-A-Cath remains in place with tip overlying the lower SVC. An enteric tube has been placed and  courses into the upper abdomen with tip not imaged. The cardiomediastinal silhouette is unchanged. There is mild elevation of the right hemidiaphragm. No confluent airspace opacity, edema, sizable pleural effusion, or pneumothorax is identified. Trace fluid or fissural thickening along the right minor fissure, unchanged. IMPRESSION: 1. Enteric tube courses into the stomach with tip not imaged. 2. Right hemidiaphragm elevation. No evidence of acute airspace disease. Electronically Signed   By: Logan Bores M.D.   On: 05/31/2016 19:58   Dg Abd Acute W/chest  Result Date: 05/08/2016 CLINICAL DATA:  Abdominal pain, nausea and vomiting for 3 days EXAM: DG ABDOMEN ACUTE W/ 1V CHEST COMPARISON:  05/08/16 FINDINGS: Cardiomediastinal silhouette is stable. There is elevation of the left hemidiaphragm. Right subclavian Port-A-Cath is unchanged in position. No infiltrate or pulmonary edema. There are distended small bowel loops in the abdomen suspicious for ileus partial obstruction or enteritis. Moderate stool and gas noted in right colon and rectosigmoid colon. No evidence of free abdominal air IMPRESSION: No acute disease within chest. Gaseous distended small bowel loops in left abdomen suspicious for ileus, partial bowel obstruction or enteritis. Moderate stool and gas in right colon and rectosigmoid colon. No free abdominal air. Electronically Signed   By: Lahoma Crocker M.D.   On: 05/06/2016 13:02    Anti-infectives: Anti-infectives    Start     Dose/Rate Route Frequency Ordered Stop   05/31/2016 1315  vancomycin (VANCOCIN) IVPB 1000 mg/200 mL premix     1,000 mg 200 mL/hr over 60 Minutes Intravenous  Once 05/14/2016 1314 05/31/2016 1434   05/19/2016 1315  piperacillin-tazobactam (ZOSYN) IVPB 3.375 g     3.375 g 100 mL/hr over 30 Minutes Intravenous  Once 05/24/2016 1314 05/25/2016 1353      Assessment/Plan:  Discussed with nursing patient is DNR/DNI DO NOT RESUSCITATE and is experiencing acute deterioration she is not  a surgical candidate at this point but would be available if necessary.  Florene Glen, MD, FACS  05/19/2016

## 2016-05-19 NOTE — Progress Notes (Signed)
After further discussion with family, patient with very poor prognosis, patient has suffered enough according to family They have decided(husband and 2 daughters) that she needs to be comfortable.  They have agreed and consented to comfort care measures.    Overall, patient is critically ill, prognosis is guarded.  Patient with Multiorgan failure and at high risk for cardiac arrest and death.    Corrin Parker, M.D.  [facility]  Medical Director [place] Director [facility] Cardio-Pulmonary Department

## 2016-05-19 NOTE — Progress Notes (Signed)
Patients neuro status has decreased this shift, pt arousable but unable to communicate with staff. Remains on Neo-Synephrine and Levophed drips for BP support. Otherwise VS stable. Patient received prn morphine for pain relief, now seems to be resting comfortably in bed. Attempted to call husband [name]) via mobile and home phones to update on change in patient condition, there was no answer. Message left, will try again later. At this time patient is resting comfortably with no signs of distress. Will continue to monitor.

## 2016-05-19 NOTE — Progress Notes (Signed)
PULMONARY / CRITICAL CARE MEDICINE   Name: [name] MRN: [id number] DOB: 11/26/1945    ADMISSION DATE:  05/12/2016 CHIEF COMPLAINT:  abd pain      HISTORY OF PRESENT ILLNESS:   Patient obtunded, low BP -patient is DNR/DNI patient in distress and in pain    REVIEW OF SYSTEMS:  Limited due to critical illness    VITAL SIGNS: Temp:  [96.3 F (35.7 C)-97.8 F (36.6 C)] 97.8 F (36.6 C) (09/16 0400) Pulse Rate:  [48-112] 74 (09/16 0700) Resp:  [11-25] 12 (09/16 0700) BP: (54-169)/(26-130) 93/62 (09/16 0700) SpO2:  [74 %-100 %] 96 % (09/16 0700) Weight:  [150 lb (68 kg)-175 lb 11.3 oz (79.7 kg)] 175 lb 11.3 oz (79.7 kg) (09/16 0500) HEMODYNAMICS:   Intake/Output Summary (Last 24 hours) at 05/19/16 0750 Last data filed at 05/12/2016 1643  Gross per 24 hour  Intake             3500 ml  Output                0 ml  Net             3500 ml      Physical Examination:   GENERAL:critically ill appearing, +resp distress HEAD: Normocephalic, atraumatic.  EYES: Pupils equal, round, reactive to light.  No scleral icterus.  MOUTH: dry mucosal membrane. NECK: Supple. No thyromegaly. No nodules. No JVD. c collar in place PULMONARY: Diffuse coarse rhonchi +rhonchi CARDIOVASCULAR: S1 and S2. Regular rate and rhythm. No murmurs, rubs, or gallops.  GASTROINTESTINAL:+tender, +distended. No masses.neg bowel sounds. No hepatosplenomegaly.  MUSCULOSKELETAL: No swelling, clubbing, or edema.  NEUROLOGIC: no focal deficits SKIN:intact,warm,dry    Recent Labs Lab 05/17/2016 1158 05/19/16 0455  WBC 14.2* 15.0*  HGB 10.7* 11.9*  HCT 32.7* 35.4  PLT 102* 115*   Coag's  Recent Labs Lab 05/17/2016 1158  INR 1.23   BMET  Recent Labs Lab 05/22/2016 1158 05/19/16 0455  NA 134* 136  K 5.6* 4.9  CL 101 110  CO2 21* 17*  BUN 65* 51*  CREATININE 3.51* 2.83*  GLUCOSE 67 161*   Electrolytes  Recent Labs Lab 05/23/2016 1158 05/19/16 0455  CALCIUM 7.8* 6.7*  MG  --   1.8  PHOS  --  2.5   Sepsis Markers  Recent Labs Lab 05/24/2016 1158 05/11/2016 1516  LATICACIDVEN 2.1* 1.6   ABG No results for input(s): PHART, PCO2ART, PO2ART in the last 168 hours. Liver Enzymes  Recent Labs Lab 05/15/2016 1158  AST 60*  ALT 48  ALKPHOS 801*  BILITOT 5.4*  ALBUMIN 2.1*   Cardiac Enzymes  Recent Labs Lab 05/20/2016 1158 05/19/16 0455  TROPONINI 0.09* 0.05*   Glucose  Recent Labs Lab 05/13/2016 1835  GLUCAP 68    Imaging Ct Abdomen Pelvis Wo Contrast  Result Date: 05/14/2016 CLINICAL DATA:  Abdominal pain, urinary retention, constipation for 3 days. EXAM: CT ABDOMEN AND PELVIS WITHOUT CONTRAST TECHNIQUE: Multidetector CT imaging of the abdomen and pelvis was performed following the standard protocol without IV contrast. COMPARISON:  CT 05/09/2016.  Plain films 05/24/2016. FINDINGS: Lower chest: Small right pleural effusion with right base atelectasis. Left base is clear. Heart is normal size. Hepatobiliary: Severe diffuse fatty infiltration of the liver. No focal abnormality seen. Prior cholecystectomy. Pancreas: No focal abnormality or ductal dilatation. Spleen: No focal abnormality.  Normal size. Adrenals/Urinary Tract: Numerous bilateral low-density and high-density cysts throughout both kidneys, stable since prior study. No hydronephrosis. Adrenal glands are unremarkable.  Urinary bladder decompressed. Stomach/Bowel: Left lower quadrant ostomy. Colon is decompressed. Dilated small bowel loops in the abdomen and pelvis, similar to prior CT compatible with small bowel obstruction. Distal small bowel loops decompressed. Exact transition point not visualized. Vascular/Lymphatic: Diffuse aortic and iliac calcifications. No aneurysm. No adenopathy. Reproductive: Prior hysterectomy.  No adnexal mass. Other: No free fluid or free air. Musculoskeletal: No acute bony abnormality. IMPRESSION: Dilated small bowel loops. Distal small bowel loops are decompressed. Findings  compatible with small bowel obstruction. Exact transition point not visualized. Severe diffuse fatty infiltration of the liver. Numerous hyper row and hypodense cysts within the kidneys, stable. Small right pleural effusion.  Right lower lobe atelectasis. Electronically Signed   By: Rolm Baptise M.D.   On: 05/06/2016 14:47   Dg Chest Port 1 View  Result Date: 05/19/2016 CLINICAL DATA:  INTERVAL CHANGES EXAM: PORTABLE CHEST 1 VIEW FINDINGS: Central venous line and NG tube unchanged. Normal cardiac silhouette. LEFT basilar atelectasis and effusion. No infiltrate or pneumothorax. IMPRESSION: 1. Mild increase in LEFT effusion/atelectasis. 2. Stable support apparatus. Electronically Signed   By: Suzy Bouchard M.D.   On: 05/19/2016 07:14   Dg Chest Port 1 View  Result Date: 05/20/2016 CLINICAL DATA:  NG tube placement. EXAM: PORTABLE CHEST 1 VIEW COMPARISON:  05/13/2016 at 1234 hours FINDINGS: Right subclavian Port-A-Cath remains in place with tip overlying the lower SVC. An enteric tube has been placed and courses into the upper abdomen with tip not imaged. The cardiomediastinal silhouette is unchanged. There is mild elevation of the right hemidiaphragm. No confluent airspace opacity, edema, sizable pleural effusion, or pneumothorax is identified. Trace fluid or fissural thickening along the right minor fissure, unchanged. IMPRESSION: 1. Enteric tube courses into the stomach with tip not imaged. 2. Right hemidiaphragm elevation. No evidence of acute airspace disease. Electronically Signed   By: Logan Bores M.D.   On: 05/26/2016 19:58   Dg Abd Acute W/chest  Result Date: 05/04/2016 CLINICAL DATA:  Abdominal pain, nausea and vomiting for 3 days EXAM: DG ABDOMEN ACUTE W/ 1V CHEST COMPARISON:  05/08/16 FINDINGS: Cardiomediastinal silhouette is stable. There is elevation of the left hemidiaphragm. Right subclavian Port-A-Cath is unchanged in position. No infiltrate or pulmonary edema. There are distended small  bowel loops in the abdomen suspicious for ileus partial obstruction or enteritis. Moderate stool and gas noted in right colon and rectosigmoid colon. No evidence of free abdominal air IMPRESSION: No acute disease within chest. Gaseous distended small bowel loops in left abdomen suspicious for ileus, partial bowel obstruction or enteritis. Moderate stool and gas in right colon and rectosigmoid colon. No free abdominal air. Electronically Signed   By: Lahoma Crocker M.D.   On: 05/09/2016 13:02     ASSESSMENT / PLAN:  70 yo white female with endometrial cancer s/p colostomy for diverticulitis with acute abd pain with acute renal failure and hypovolumic shock with SBO  Patient obtunded and is declining, recommend comfort care measures, awaiting for husband to come to bedside  CARDIOVASCULAR Will give more IVF's before starting Vasopressors -will not place CVL at this time -obtain PIV Baseline BP is SBP 90   RENAL Renal Failure-most likely due to ATN -follow chem 7 -follow UO -continue Foley Catheter-assess need  GASTROINTESTINAL SBO with endometrial cancer -continue NG suction, keep NPO   HEMATOLOGIC Follow CBC  FAMILY  Unavailable at this time    I have personally obtained a history, examined the patient, evaluated Pertinent laboratory and RadioGraph[employer]/imaging results, and  formulated the  assessment and plan   The Patient requires high complexity decision making for assessment and support, frequent evaluation and titration of therapies, application of advanced monitoring technologies and extensive interpretation of multiple databases. Critical Care Time devoted to patient care services described in this note is 45  minutes.   Overall, patient is critically ill, prognosis is guarded.   high risk for cardiac arrest and death.  Recommend comfort care measures   Corrin Parker, M.D.  [facility]  Medical Director [place]  Director [facility] Cardio-Pulmonary Department

## 2016-05-19 NOTE — Plan of Care (Signed)
Problem: Pain Managment: Goal: General experience of comfort will improve Outcome: Progressing Pt continues to require Morphine for difficulty breathing.

## 2016-05-20 NOTE — Plan of Care (Signed)
Problem: Pain Managment: Goal: General experience of comfort will improve Outcome: Progressing Morphine IV given for pain/dyspnea. Relief noted.

## 2016-05-23 LAB — CULTURE, BLOOD (ROUTINE X 2): Culture: NO GROWTH

## 2016-06-03 NOTE — Progress Notes (Signed)
Patient expired at 0543 this morning verified by 2 RN. Family notified, MD notified, and Nursing supervisor notified.

## 2016-06-03 NOTE — Progress Notes (Signed)
[place] donor was notified and RN spoke with Guerry Bruin.

## 2016-06-03 DEATH — deceased

## 2016-08-11 ENCOUNTER — Other Ambulatory Visit: Payer: Self-pay | Admitting: Nurse Practitioner

## 2017-07-29 IMAGING — DX DG CHEST 1V PORT
1 series · 1 of 1 positions shown · non-contrast
Comparison: 03/01/2016

CLINICAL DATA: Weakness, nausea and vomiting.

EXAM:
PORTABLE CHEST 1 VIEW

[chest ap]
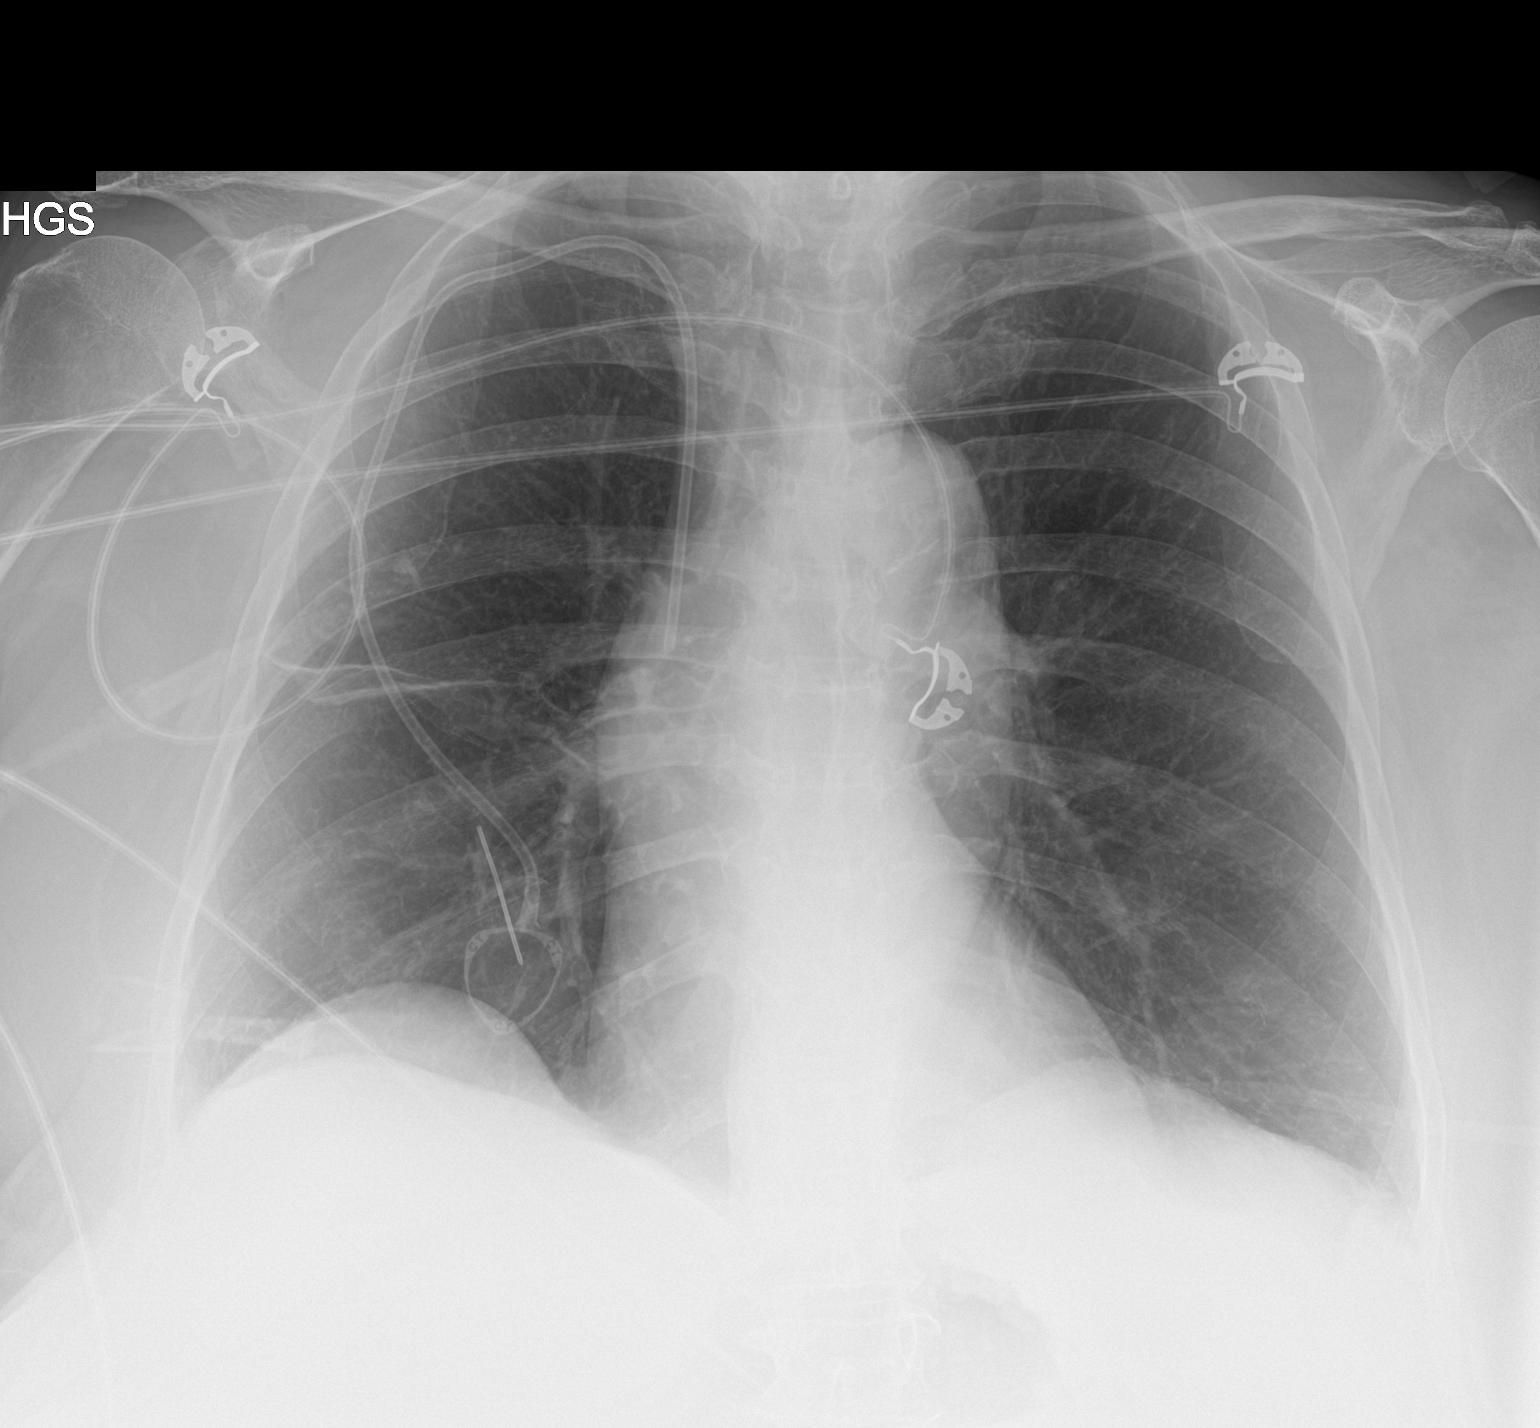

[1 of 1 positions shown; findings below may reference images not displayed]

FINDINGS: There is a right chest wall port a catheter noted with tip in the
projection of the SVC peer heart size is normal. No pleural effusion
or edema identified. Subsegmental atelectasis noted within the right
midlung. No airspace consolidation identified.
IMPRESSION: 1. Right midlung subsegmental atelectasis.

## 2017-08-06 IMAGING — NM NM PULMONARY VENT & PERF
2 series · 16 of 16 positions shown · non-contrast
Comparison: Chest x-ray from earlier in the same day

CLINICAL DATA: Hypoxia and weakness

EXAM:
NUCLEAR MEDICINE VENTILATION - PERFUSION LUNG SCAN
TECHNIQUE: Ventilation images were obtained in multiple projections using
inhaled aerosol 5c-IIm DTPA. Perfusion images were obtained in
multiple projections after intravenous injection of 5c-IIm MAA.
RADIOPHARMACEUTICALS:  32.53 mCi Eechnetium-YYm DTPA aerosol
inhalation and 4.368 mCi Eechnetium-YYm MAA IV

[Series 1000: lung ventilation · 3.90mm/px · 4 acquisitions, 8 frames shown]
[im 1/4]
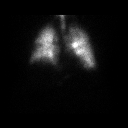
[im 1/4]
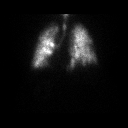
[im 2/4]
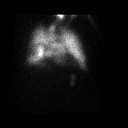
[im 2/4]
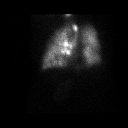
[im 3/4]
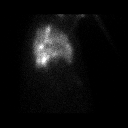
[im 3/4]
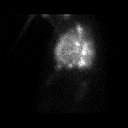
[im 4/4]
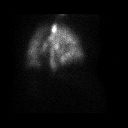
[im 4/4]
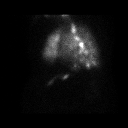

[Series 1000: lung perfusion · 1.95mm/px · 4 acquisitions, 8 frames shown]
[im 1/4]
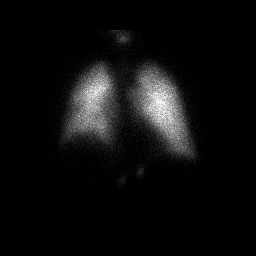
[im 1/4]
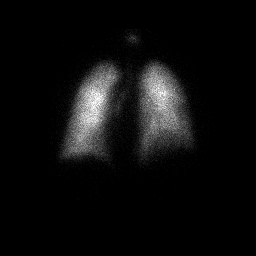
[im 2/4]
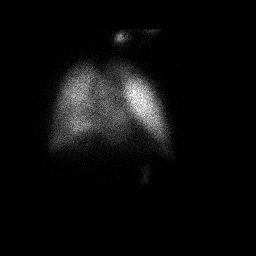
[im 2/4]
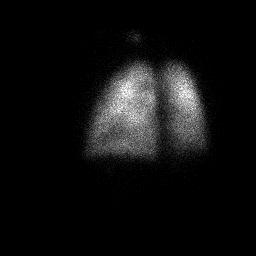
[im 3/4]
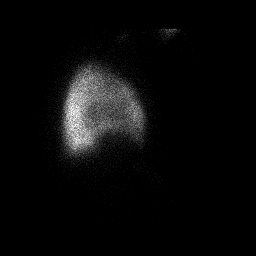
[im 3/4]
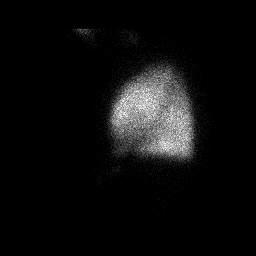
[im 4/4]
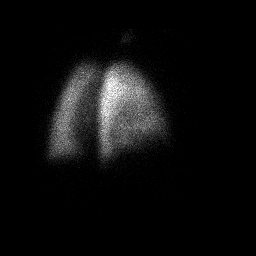
[im 4/4]
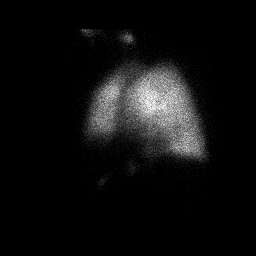

[16 of 16 positions shown; findings below may reference images not displayed]

FINDINGS: Ventilation: Ventilation images demonstrate some central clumping
although no definitive ventilation defect is seen.

Perfusion: No wedge shaped peripheral perfusion defects to suggest
acute pulmonary embolism.
IMPRESSION: No ventilation-perfusion mismatch to suggest pulmonary embolism is
noted.

## 2018-02-10 IMAGING — US US RENAL
1 series · 14 of 25 positions shown · non-contrast
Comparison: PET-CT 02/16/2016.  CT abdomen 02/06/2016.

CLINICAL DATA: Acute renal failure.

EXAM:
RENAL / URINARY TRACT ULTRASOUND COMPLETE

[Series 1: us renal · 0.23mm/px · 14 of 37 slices shown]
[im 1/37]
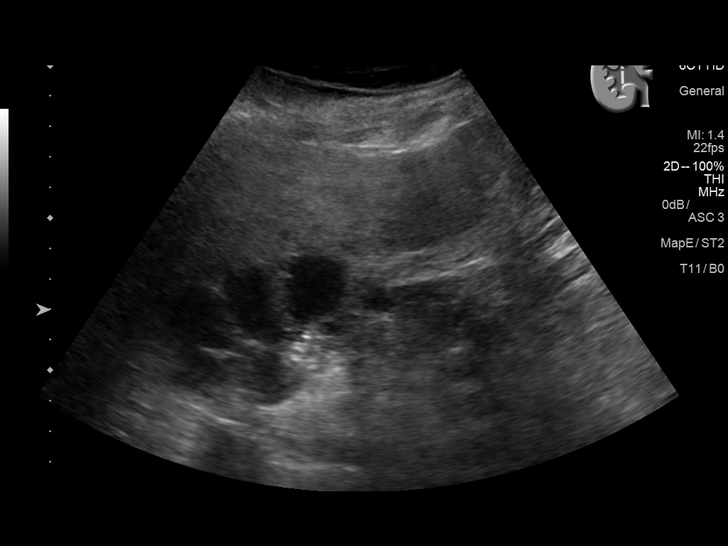
[im 4/37]
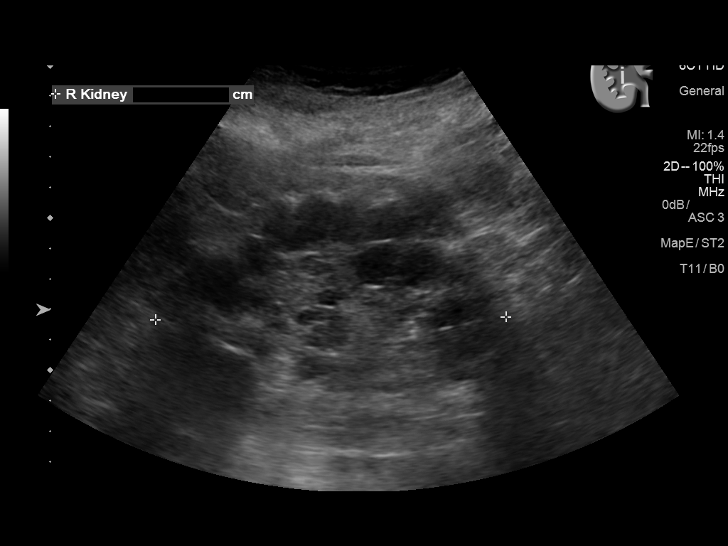
[im 7/37]
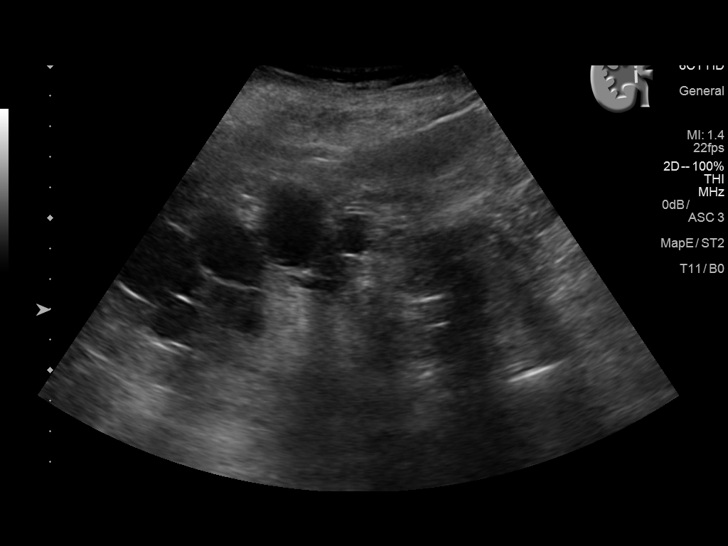
[im 10/37]
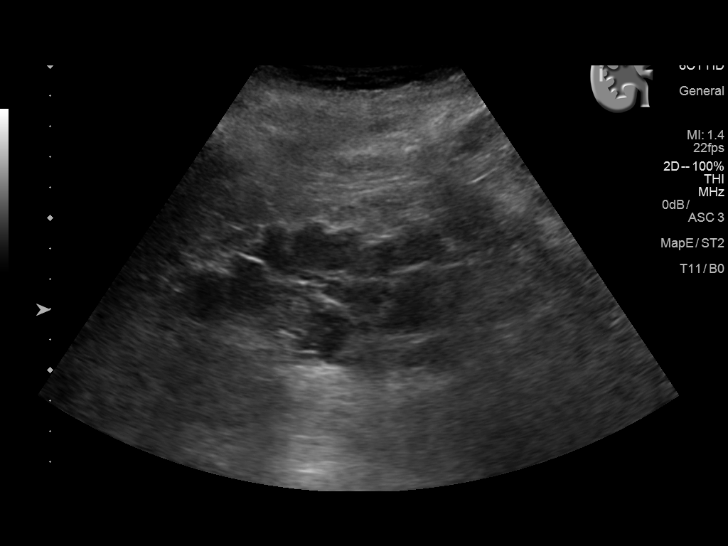
[im 13/37]
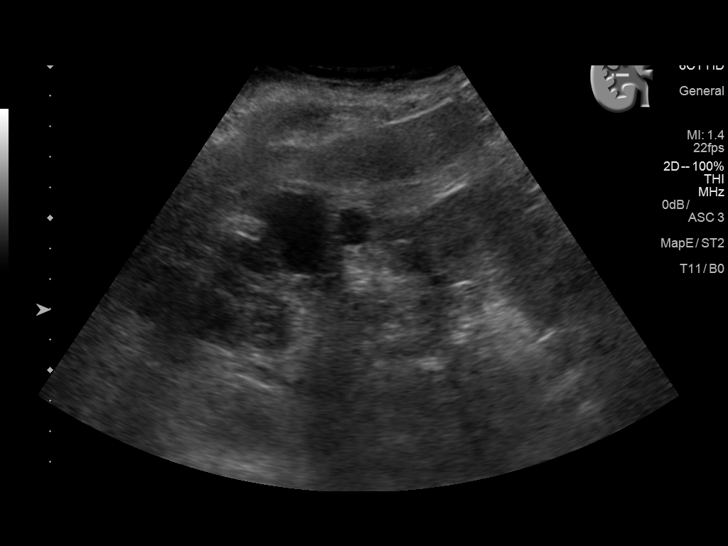
[im 14/37]
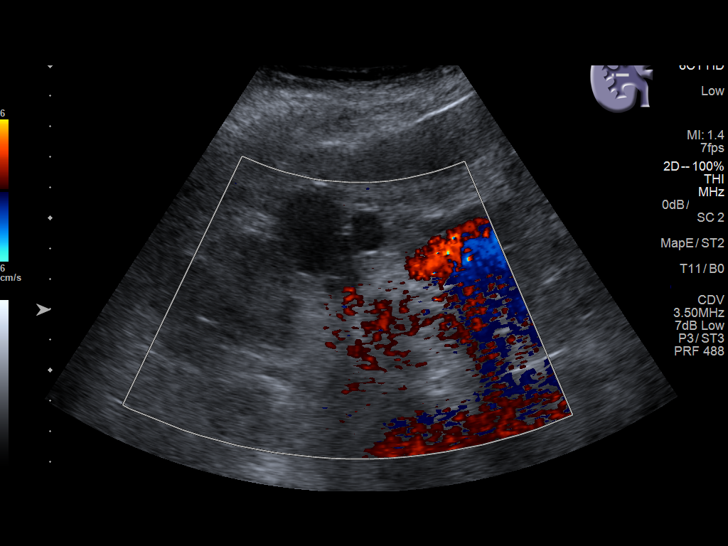
[im 17/37]
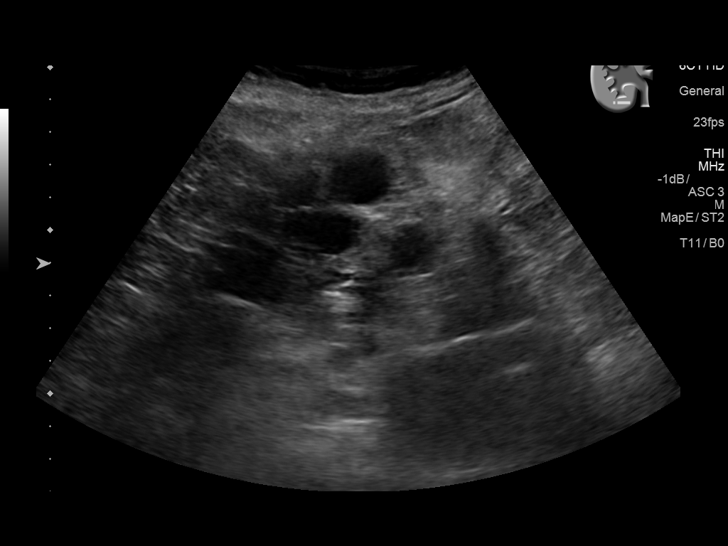
[im 20/37]
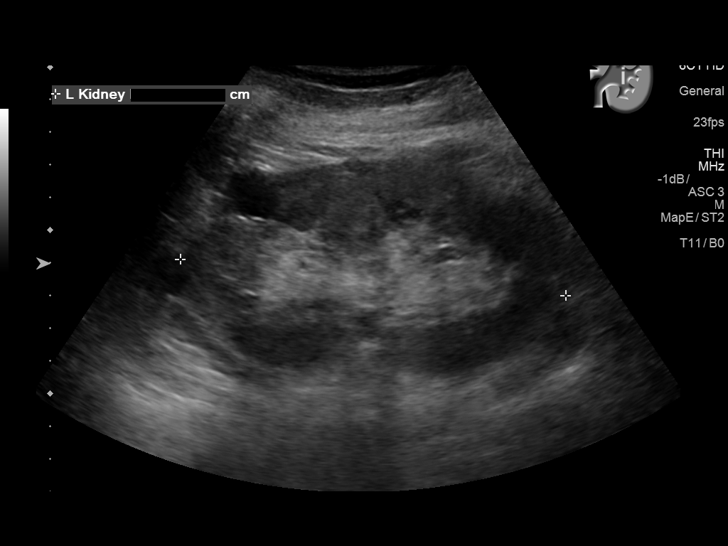
[im 23/37]
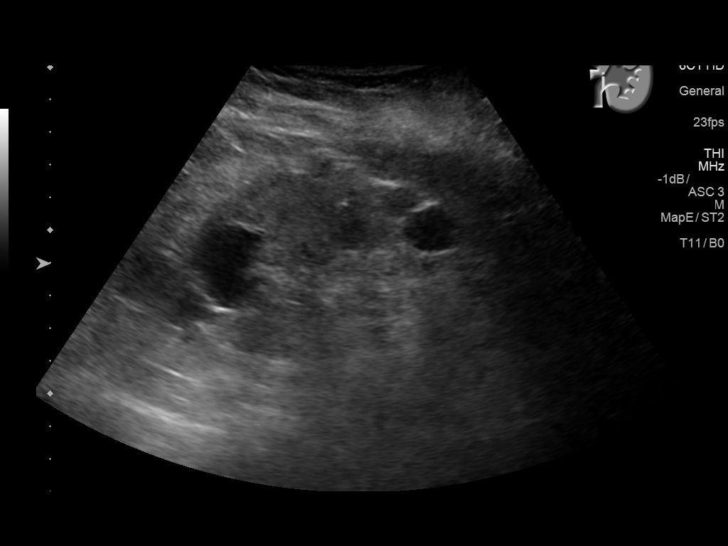
[im 25/37]
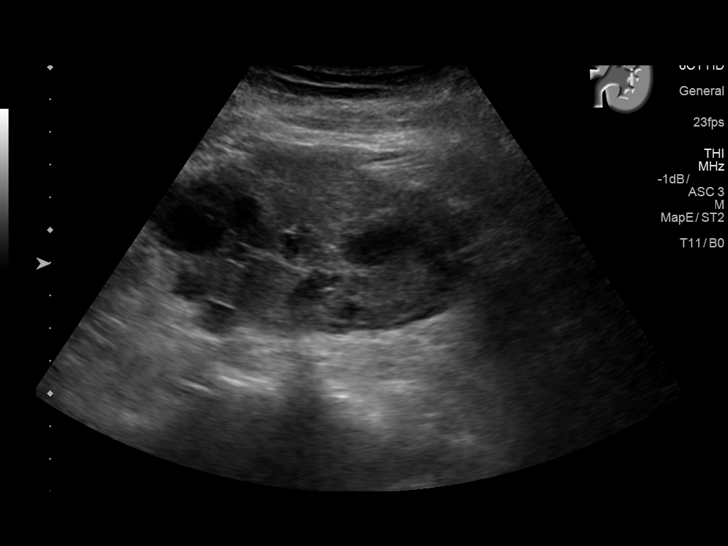
[im 28/37]
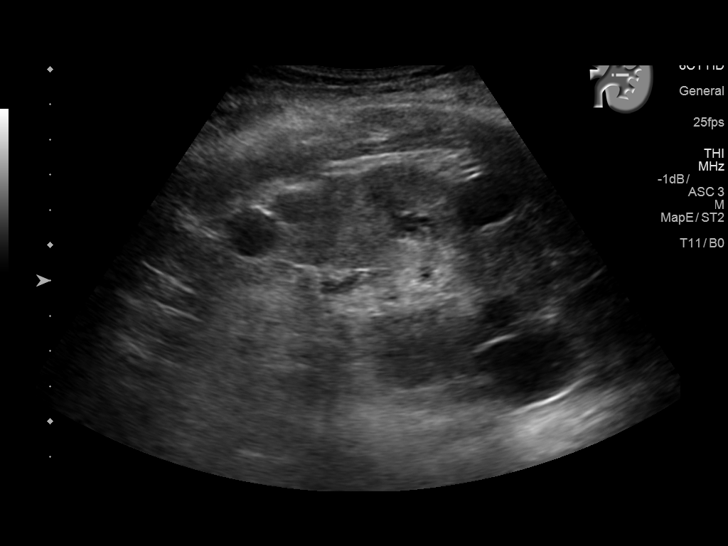
[im 31/37]
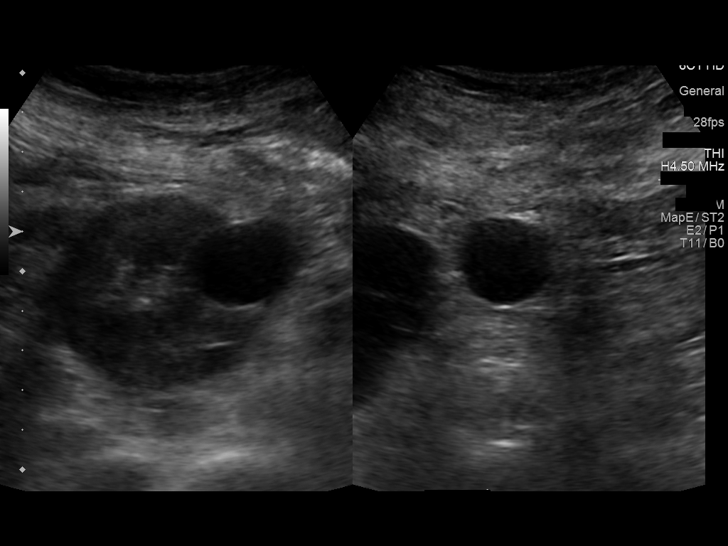
[im 34/37]
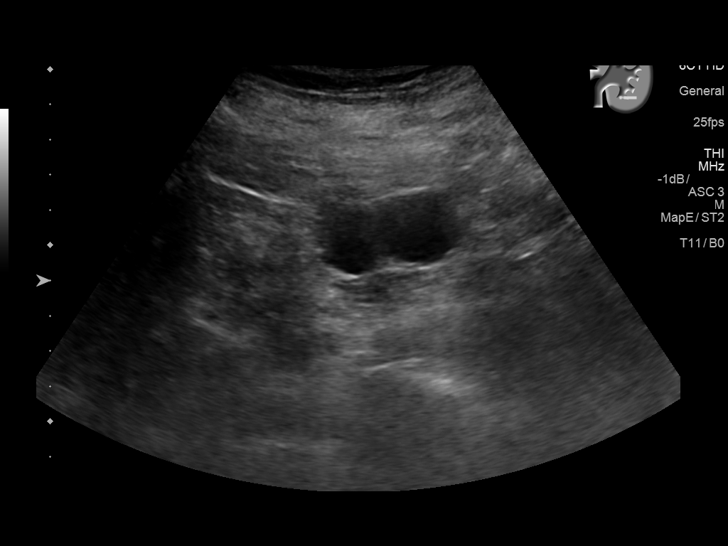
[im 37/37]
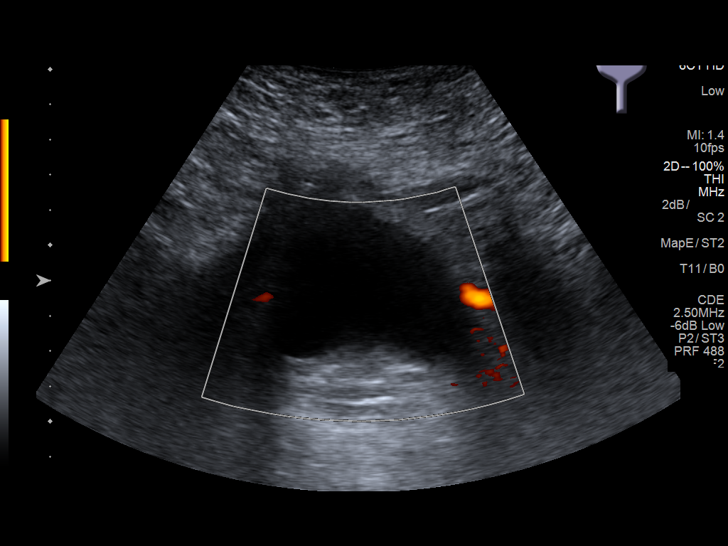

[14 of 25 positions shown; findings below may reference images not displayed]

FINDINGS: Right Kidney:

Length: 11.5 cm. Echogenicity within normal limits. Multiple renal
cysts are noted some of which are thinly septated. No definite solid
lesion identified. Polycystic kidney disease could present in this
fashion. No hydronephrosis.

Left Kidney:

Length: 9.8 cm. Echogenicity within normal limits. Multiple renal
cysts are noted some of which are thinly septated. Polycystic kidney
disease could present this fashion. No hydronephrosis .

Bladder:

Appears normal for degree of bladder distention.
IMPRESSION: 1. Multiple bilateral renal cysts some of which are thinly septated.
No definite solid lesion identified. Polycystic kidney disease could
present this fashion. No hydronephrosis or acute abnormality.

2. No evidence of bladder distention.
